# Patient Record
Sex: Female | Born: 1935 | Race: White | Hispanic: No | Smoking: Former smoker
Health system: Southern US, Community
[De-identification: ages and names within clinical notes are randomized; demographics above are authoritative.]

## PROBLEM LIST (undated history)

## (undated) DIAGNOSIS — I10 Essential (primary) hypertension: Secondary | ICD-10-CM

## (undated) DIAGNOSIS — C9110 Chronic lymphocytic leukemia of B-cell type not having achieved remission: Secondary | ICD-10-CM

## (undated) DIAGNOSIS — C569 Malignant neoplasm of unspecified ovary: Secondary | ICD-10-CM

## (undated) DIAGNOSIS — M1990 Unspecified osteoarthritis, unspecified site: Secondary | ICD-10-CM

## (undated) DIAGNOSIS — D649 Anemia, unspecified: Secondary | ICD-10-CM

## (undated) HISTORY — PX: ABDOMINAL HYSTERECTOMY: SHX81

## (undated) HISTORY — PX: APPENDECTOMY: SHX54

## (undated) HISTORY — PX: BREAST SURGERY: SHX581

## (undated) HISTORY — PX: EYE SURGERY: SHX253

---

## 1961-03-21 DIAGNOSIS — C569 Malignant neoplasm of unspecified ovary: Secondary | ICD-10-CM

## 1961-03-21 HISTORY — DX: Malignant neoplasm of unspecified ovary: C56.9

## 2004-05-10 ENCOUNTER — Ambulatory Visit: Payer: Self-pay | Admitting: Obstetrics and Gynecology

## 2005-06-06 ENCOUNTER — Ambulatory Visit: Payer: Self-pay | Admitting: Obstetrics and Gynecology

## 2005-06-09 ENCOUNTER — Ambulatory Visit: Payer: Self-pay | Admitting: Obstetrics and Gynecology

## 2005-08-26 ENCOUNTER — Ambulatory Visit: Payer: Self-pay | Admitting: Unknown Physician Specialty

## 2006-06-15 ENCOUNTER — Ambulatory Visit: Payer: Self-pay | Admitting: Obstetrics and Gynecology

## 2006-10-16 ENCOUNTER — Ambulatory Visit: Payer: Self-pay | Admitting: Internal Medicine

## 2006-10-20 ENCOUNTER — Ambulatory Visit: Payer: Self-pay | Admitting: Internal Medicine

## 2006-11-20 ENCOUNTER — Ambulatory Visit: Payer: Self-pay | Admitting: Internal Medicine

## 2006-12-20 ENCOUNTER — Ambulatory Visit: Payer: Self-pay | Admitting: Internal Medicine

## 2006-12-22 ENCOUNTER — Ambulatory Visit: Payer: Self-pay | Admitting: Internal Medicine

## 2007-01-20 ENCOUNTER — Ambulatory Visit: Payer: Self-pay | Admitting: Internal Medicine

## 2007-03-22 ENCOUNTER — Ambulatory Visit: Payer: Self-pay | Admitting: Internal Medicine

## 2007-03-23 ENCOUNTER — Ambulatory Visit: Payer: Self-pay | Admitting: Internal Medicine

## 2007-04-22 ENCOUNTER — Ambulatory Visit: Payer: Self-pay | Admitting: Internal Medicine

## 2007-05-20 ENCOUNTER — Ambulatory Visit: Payer: Self-pay | Admitting: Internal Medicine

## 2007-06-18 ENCOUNTER — Ambulatory Visit: Payer: Self-pay | Admitting: Internal Medicine

## 2007-06-20 ENCOUNTER — Ambulatory Visit: Payer: Self-pay | Admitting: Internal Medicine

## 2007-09-17 ENCOUNTER — Ambulatory Visit: Payer: Self-pay | Admitting: Obstetrics and Gynecology

## 2007-09-19 ENCOUNTER — Ambulatory Visit: Payer: Self-pay | Admitting: Internal Medicine

## 2007-10-01 ENCOUNTER — Ambulatory Visit: Payer: Self-pay | Admitting: Internal Medicine

## 2007-10-20 ENCOUNTER — Ambulatory Visit: Payer: Self-pay | Admitting: Internal Medicine

## 2008-01-20 ENCOUNTER — Ambulatory Visit: Payer: Self-pay | Admitting: Internal Medicine

## 2008-02-04 ENCOUNTER — Ambulatory Visit: Payer: Self-pay | Admitting: Internal Medicine

## 2008-02-19 ENCOUNTER — Ambulatory Visit: Payer: Self-pay | Admitting: Internal Medicine

## 2008-05-19 ENCOUNTER — Ambulatory Visit: Payer: Self-pay | Admitting: Internal Medicine

## 2008-05-26 ENCOUNTER — Ambulatory Visit: Payer: Self-pay | Admitting: Internal Medicine

## 2008-06-19 ENCOUNTER — Ambulatory Visit: Payer: Self-pay | Admitting: Internal Medicine

## 2008-08-19 ENCOUNTER — Ambulatory Visit: Payer: Self-pay | Admitting: Internal Medicine

## 2008-09-15 ENCOUNTER — Ambulatory Visit: Payer: Self-pay | Admitting: Internal Medicine

## 2008-09-18 ENCOUNTER — Ambulatory Visit: Payer: Self-pay | Admitting: Internal Medicine

## 2008-09-18 ENCOUNTER — Ambulatory Visit: Payer: Self-pay | Admitting: Obstetrics and Gynecology

## 2008-12-19 ENCOUNTER — Ambulatory Visit: Payer: Self-pay | Admitting: Internal Medicine

## 2009-01-12 ENCOUNTER — Ambulatory Visit: Payer: Self-pay | Admitting: Internal Medicine

## 2009-01-19 ENCOUNTER — Ambulatory Visit: Payer: Self-pay | Admitting: Internal Medicine

## 2009-04-06 ENCOUNTER — Ambulatory Visit: Payer: Self-pay | Admitting: Internal Medicine

## 2009-04-21 ENCOUNTER — Ambulatory Visit: Payer: Self-pay | Admitting: Internal Medicine

## 2009-06-19 ENCOUNTER — Ambulatory Visit: Payer: Self-pay | Admitting: Internal Medicine

## 2009-07-06 ENCOUNTER — Ambulatory Visit: Payer: Self-pay | Admitting: Internal Medicine

## 2009-07-06 ENCOUNTER — Emergency Department: Payer: Self-pay

## 2009-07-19 ENCOUNTER — Ambulatory Visit: Payer: Self-pay | Admitting: Internal Medicine

## 2009-08-17 ENCOUNTER — Ambulatory Visit: Payer: Self-pay | Admitting: Family Medicine

## 2009-08-19 ENCOUNTER — Ambulatory Visit: Payer: Self-pay | Admitting: Internal Medicine

## 2009-09-07 ENCOUNTER — Ambulatory Visit: Payer: Self-pay | Admitting: Internal Medicine

## 2009-09-18 ENCOUNTER — Ambulatory Visit: Payer: Self-pay | Admitting: Internal Medicine

## 2009-10-09 ENCOUNTER — Encounter: Admission: RE | Admit: 2009-10-09 | Discharge: 2009-10-09 | Payer: Self-pay | Admitting: Neurosurgery

## 2009-10-19 ENCOUNTER — Ambulatory Visit: Payer: Self-pay | Admitting: Internal Medicine

## 2009-11-19 ENCOUNTER — Ambulatory Visit: Payer: Self-pay | Admitting: Internal Medicine

## 2009-12-19 ENCOUNTER — Ambulatory Visit: Payer: Self-pay | Admitting: Internal Medicine

## 2009-12-21 ENCOUNTER — Ambulatory Visit: Payer: Self-pay | Admitting: Internal Medicine

## 2010-01-19 ENCOUNTER — Ambulatory Visit: Payer: Self-pay | Admitting: Internal Medicine

## 2010-02-22 ENCOUNTER — Ambulatory Visit: Payer: Self-pay | Admitting: Internal Medicine

## 2010-03-21 ENCOUNTER — Ambulatory Visit: Payer: Self-pay | Admitting: Internal Medicine

## 2010-05-24 ENCOUNTER — Ambulatory Visit: Payer: Self-pay | Admitting: Internal Medicine

## 2010-06-20 ENCOUNTER — Ambulatory Visit: Payer: Self-pay | Admitting: Internal Medicine

## 2010-07-20 ENCOUNTER — Ambulatory Visit: Payer: Self-pay | Admitting: Internal Medicine

## 2010-08-20 ENCOUNTER — Ambulatory Visit: Payer: Self-pay | Admitting: Internal Medicine

## 2010-09-20 ENCOUNTER — Ambulatory Visit: Payer: Self-pay | Admitting: Internal Medicine

## 2010-10-20 ENCOUNTER — Ambulatory Visit: Payer: Self-pay | Admitting: Internal Medicine

## 2010-10-27 ENCOUNTER — Ambulatory Visit: Payer: Self-pay | Admitting: Obstetrics and Gynecology

## 2010-11-29 ENCOUNTER — Ambulatory Visit: Payer: Self-pay | Admitting: Internal Medicine

## 2010-12-20 ENCOUNTER — Ambulatory Visit: Payer: Self-pay | Admitting: Internal Medicine

## 2010-12-30 ENCOUNTER — Encounter: Payer: Self-pay | Admitting: Rheumatology

## 2011-01-31 ENCOUNTER — Ambulatory Visit: Payer: Self-pay | Admitting: Internal Medicine

## 2011-02-19 ENCOUNTER — Ambulatory Visit: Payer: Self-pay | Admitting: Internal Medicine

## 2011-03-22 ENCOUNTER — Ambulatory Visit: Payer: Self-pay | Admitting: Internal Medicine

## 2011-05-18 ENCOUNTER — Ambulatory Visit: Payer: Self-pay | Admitting: Oncology

## 2011-05-18 LAB — CBC CANCER CENTER
Basophil #: 0.1 x10 3/mm (ref 0.0–0.1)
Basophil %: 1.4 %
Eosinophil #: 0.1 x10 3/mm (ref 0.0–0.7)
Eosinophil %: 1.1 %
HCT: 29.7 % — ABNORMAL LOW (ref 35.0–47.0)
HGB: 10.1 g/dL — ABNORMAL LOW (ref 12.0–16.0)
Lymphocyte #: 1.6 x10 3/mm (ref 1.0–3.6)
Lymphocyte %: 25.1 %
MCH: 35.1 pg — ABNORMAL HIGH (ref 26.0–34.0)
MCHC: 34.2 g/dL (ref 32.0–36.0)
MCV: 102.9 fL — ABNORMAL HIGH (ref 80–100)
Monocyte #: 0.6 x10 3/mm (ref 0.0–0.7)
Monocyte %: 9.1 %
Neutrophil #: 3.8 x10 3/mm (ref 1.4–6.5)
Neutrophil %: 63.3 %
Platelet: 163 x10 3/mm (ref 150–440)
RBC: 2.89 10*6/uL — ABNORMAL LOW (ref 3.80–5.20)
RDW: 13 % (ref 11.5–14.5)
WBC: 6.2 x10 3/mm (ref 3.6–11.0)

## 2011-05-20 ENCOUNTER — Ambulatory Visit: Payer: Self-pay | Admitting: Oncology

## 2011-07-20 ENCOUNTER — Ambulatory Visit: Payer: Self-pay | Admitting: Oncology

## 2011-07-20 LAB — IRON AND TIBC
Iron Bind.Cap.(Total): 382 ug/dL (ref 250–450)
Iron Saturation: 22 %
Iron: 84 ug/dL (ref 50–170)
Unbound Iron-Bind.Cap.: 298 ug/dL

## 2011-07-20 LAB — CBC CANCER CENTER
Basophil #: 0.1 x10 3/mm (ref 0.0–0.1)
Basophil %: 1.2 %
Eosinophil #: 0 x10 3/mm (ref 0.0–0.7)
Eosinophil %: 0.9 %
HCT: 29.9 % — ABNORMAL LOW (ref 35.0–47.0)
HGB: 10.3 g/dL — ABNORMAL LOW (ref 12.0–16.0)
Lymphocyte #: 1.4 x10 3/mm (ref 1.0–3.6)
Lymphocyte %: 26.3 %
MCH: 35.7 pg — ABNORMAL HIGH (ref 26.0–34.0)
MCHC: 34.5 g/dL (ref 32.0–36.0)
MCV: 103 fL — ABNORMAL HIGH (ref 80–100)
Monocyte #: 0.4 x10 3/mm (ref 0.2–0.9)
Monocyte %: 7.4 %
Neutrophil #: 3.5 x10 3/mm (ref 1.4–6.5)
Neutrophil %: 64.2 %
Platelet: 149 x10 3/mm — ABNORMAL LOW (ref 150–440)
RBC: 2.89 10*6/uL — ABNORMAL LOW (ref 3.80–5.20)
RDW: 13.2 % (ref 11.5–14.5)
WBC: 5.4 x10 3/mm (ref 3.6–11.0)

## 2011-07-20 LAB — FERRITIN: Ferritin (ARMC): 113 ng/mL (ref 8–388)

## 2011-08-20 ENCOUNTER — Ambulatory Visit: Payer: Self-pay | Admitting: Oncology

## 2011-10-26 ENCOUNTER — Ambulatory Visit: Payer: Self-pay | Admitting: Oncology

## 2011-10-26 LAB — CBC CANCER CENTER
Basophil #: 0.1 x10 3/mm (ref 0.0–0.1)
Basophil %: 1.3 %
Eosinophil #: 0.1 x10 3/mm (ref 0.0–0.7)
Eosinophil %: 1.7 %
HCT: 28.3 % — ABNORMAL LOW (ref 35.0–47.0)
HGB: 9.8 g/dL — ABNORMAL LOW (ref 12.0–16.0)
Lymphocyte #: 1.4 x10 3/mm (ref 1.0–3.6)
Lymphocyte %: 30.8 %
MCH: 35.8 pg — ABNORMAL HIGH (ref 26.0–34.0)
MCHC: 34.6 g/dL (ref 32.0–36.0)
MCV: 104 fL — ABNORMAL HIGH (ref 80–100)
Monocyte #: 0.4 x10 3/mm (ref 0.2–0.9)
Monocyte %: 8.8 %
Neutrophil #: 2.7 x10 3/mm (ref 1.4–6.5)
Neutrophil %: 57.4 %
Platelet: 152 x10 3/mm (ref 150–440)
RBC: 2.72 10*6/uL — ABNORMAL LOW (ref 3.80–5.20)
RDW: 13.7 % (ref 11.5–14.5)
WBC: 4.7 x10 3/mm (ref 3.6–11.0)

## 2011-11-20 ENCOUNTER — Ambulatory Visit: Payer: Self-pay | Admitting: Oncology

## 2011-12-08 LAB — CBC CANCER CENTER
Basophil #: 0.1 x10 3/mm (ref 0.0–0.1)
Basophil %: 1.2 %
Eosinophil #: 0.1 x10 3/mm (ref 0.0–0.7)
Eosinophil %: 1 %
HCT: 30 % — ABNORMAL LOW (ref 35.0–47.0)
HGB: 10.1 g/dL — ABNORMAL LOW (ref 12.0–16.0)
Lymphocyte #: 1.8 x10 3/mm (ref 1.0–3.6)
Lymphocyte %: 31.3 %
MCH: 35.1 pg — ABNORMAL HIGH (ref 26.0–34.0)
MCHC: 33.7 g/dL (ref 32.0–36.0)
MCV: 104 fL — ABNORMAL HIGH (ref 80–100)
Monocyte #: 0.5 x10 3/mm (ref 0.2–0.9)
Monocyte %: 8.6 %
Neutrophil #: 3.3 x10 3/mm (ref 1.4–6.5)
Neutrophil %: 57.9 %
Platelet: 157 x10 3/mm (ref 150–440)
RBC: 2.88 10*6/uL — ABNORMAL LOW (ref 3.80–5.20)
RDW: 13.2 % (ref 11.5–14.5)
WBC: 5.7 x10 3/mm (ref 3.6–11.0)

## 2011-12-08 LAB — T4, FREE: Free Thyroxine: 1.15 ng/dL (ref 0.76–1.46)

## 2011-12-08 LAB — COMPREHENSIVE METABOLIC PANEL
Albumin: 4.5 g/dL (ref 3.4–5.0)
Alkaline Phosphatase: 58 U/L (ref 50–136)
Anion Gap: 10 (ref 7–16)
BUN: 17 mg/dL (ref 7–18)
Bilirubin,Total: 0.4 mg/dL (ref 0.2–1.0)
Calcium, Total: 9.6 mg/dL (ref 8.5–10.1)
Chloride: 96 mmol/L — ABNORMAL LOW (ref 98–107)
Co2: 28 mmol/L (ref 21–32)
Creatinine: 1.25 mg/dL (ref 0.60–1.30)
EGFR (African American): 47 — ABNORMAL LOW
EGFR (Non-African Amer.): 41 — ABNORMAL LOW
Glucose: 101 mg/dL — ABNORMAL HIGH (ref 65–99)
Osmolality: 270 (ref 275–301)
Potassium: 3.5 mmol/L (ref 3.5–5.1)
SGOT(AST): 26 U/L (ref 15–37)
SGPT (ALT): 31 U/L (ref 12–78)
Sodium: 134 mmol/L — ABNORMAL LOW (ref 136–145)
Total Protein: 9.1 g/dL — ABNORMAL HIGH (ref 6.4–8.2)

## 2011-12-08 LAB — TSH: Thyroid Stimulating Horm: 2 u[IU]/mL

## 2011-12-08 LAB — LACTATE DEHYDROGENASE: LDH: 139 U/L (ref 81–234)

## 2011-12-20 ENCOUNTER — Ambulatory Visit: Payer: Self-pay | Admitting: Oncology

## 2012-01-20 ENCOUNTER — Ambulatory Visit: Payer: Self-pay | Admitting: Oncology

## 2012-01-25 LAB — CBC CANCER CENTER
Basophil #: 0.1 x10 3/mm (ref 0.0–0.1)
Basophil %: 1.3 %
Eosinophil #: 0.1 x10 3/mm (ref 0.0–0.7)
Eosinophil %: 1.4 %
HCT: 29.8 % — ABNORMAL LOW (ref 35.0–47.0)
HGB: 10.4 g/dL — ABNORMAL LOW (ref 12.0–16.0)
Lymphocyte #: 1.6 x10 3/mm (ref 1.0–3.6)
Lymphocyte %: 30.5 %
MCH: 36.2 pg — ABNORMAL HIGH (ref 26.0–34.0)
MCHC: 34.8 g/dL (ref 32.0–36.0)
MCV: 104 fL — ABNORMAL HIGH (ref 80–100)
Monocyte #: 0.5 x10 3/mm (ref 0.2–0.9)
Monocyte %: 9.5 %
Neutrophil #: 3 x10 3/mm (ref 1.4–6.5)
Neutrophil %: 57.3 %
Platelet: 138 x10 3/mm — ABNORMAL LOW (ref 150–440)
RBC: 2.86 10*6/uL — ABNORMAL LOW (ref 3.80–5.20)
RDW: 13.2 % (ref 11.5–14.5)
WBC: 5.2 x10 3/mm (ref 3.6–11.0)

## 2012-02-19 ENCOUNTER — Ambulatory Visit: Payer: Self-pay | Admitting: Oncology

## 2012-05-02 ENCOUNTER — Ambulatory Visit: Payer: Self-pay | Admitting: Oncology

## 2012-05-02 LAB — CBC CANCER CENTER
Basophil #: 0.1 x10 3/mm (ref 0.0–0.1)
Basophil %: 2.2 %
Eosinophil #: 0.1 x10 3/mm (ref 0.0–0.7)
Eosinophil %: 0.9 %
HCT: 30.5 % — ABNORMAL LOW (ref 35.0–47.0)
HGB: 10.7 g/dL — ABNORMAL LOW (ref 12.0–16.0)
Lymphocyte #: 2.3 x10 3/mm (ref 1.0–3.6)
Lymphocyte %: 39.1 %
MCH: 35.3 pg — ABNORMAL HIGH (ref 26.0–34.0)
MCHC: 35.1 g/dL (ref 32.0–36.0)
MCV: 101 fL — ABNORMAL HIGH (ref 80–100)
Monocyte #: 0.4 x10 3/mm (ref 0.2–0.9)
Monocyte %: 7.2 %
Neutrophil #: 3 x10 3/mm (ref 1.4–6.5)
Neutrophil %: 50.6 %
Platelet: 114 x10 3/mm — ABNORMAL LOW (ref 150–440)
RBC: 3.04 10*6/uL — ABNORMAL LOW (ref 3.80–5.20)
RDW: 13.1 % (ref 11.5–14.5)
WBC: 5.9 x10 3/mm (ref 3.6–11.0)

## 2012-05-19 ENCOUNTER — Ambulatory Visit: Payer: Self-pay | Admitting: Oncology

## 2012-08-22 ENCOUNTER — Ambulatory Visit: Payer: Self-pay | Admitting: Oncology

## 2012-08-22 LAB — CBC CANCER CENTER
Basophil #: 0.1 x10 3/mm (ref 0.0–0.1)
Basophil %: 1 %
Eosinophil #: 0.1 x10 3/mm (ref 0.0–0.7)
Eosinophil %: 1.1 %
HCT: 28.3 % — ABNORMAL LOW (ref 35.0–47.0)
HGB: 9.8 g/dL — ABNORMAL LOW (ref 12.0–16.0)
Lymphocyte #: 5.4 x10 3/mm — ABNORMAL HIGH (ref 1.0–3.6)
Lymphocyte %: 57.5 %
MCH: 34.7 pg — ABNORMAL HIGH (ref 26.0–34.0)
MCHC: 34.8 g/dL (ref 32.0–36.0)
MCV: 100 fL (ref 80–100)
Monocyte #: 0.6 x10 3/mm (ref 0.2–0.9)
Monocyte %: 6.1 %
Neutrophil #: 3.3 x10 3/mm (ref 1.4–6.5)
Neutrophil %: 34.3 %
Platelet: 96 x10 3/mm — ABNORMAL LOW (ref 150–440)
RBC: 2.83 10*6/uL — ABNORMAL LOW (ref 3.80–5.20)
RDW: 14.1 % (ref 11.5–14.5)
WBC: 9.5 x10 3/mm (ref 3.6–11.0)

## 2012-09-18 ENCOUNTER — Ambulatory Visit: Payer: Self-pay | Admitting: Oncology

## 2012-10-19 ENCOUNTER — Ambulatory Visit: Payer: Self-pay | Admitting: Oncology

## 2012-10-20 ENCOUNTER — Inpatient Hospital Stay: Payer: Self-pay | Admitting: Surgery

## 2012-10-20 LAB — BASIC METABOLIC PANEL
Anion Gap: 12 (ref 7–16)
BUN: 15 mg/dL (ref 7–18)
Calcium, Total: 8.5 mg/dL (ref 8.5–10.1)
Chloride: 95 mmol/L — ABNORMAL LOW (ref 98–107)
Co2: 23 mmol/L (ref 21–32)
Creatinine: 0.8 mg/dL (ref 0.60–1.30)
EGFR (African American): >60
EGFR (Non-African Amer.): >60
Glucose: 149 mg/dL — ABNORMAL HIGH (ref 65–99)
Osmolality: 264 (ref 275–301)
Potassium: 3.4 mmol/L — ABNORMAL LOW (ref 3.5–5.1)
Sodium: 130 mmol/L — ABNORMAL LOW (ref 136–145)

## 2012-10-20 LAB — CBC
HCT: 21.9 % — ABNORMAL LOW (ref 35.0–47.0)
HGB: 7.4 g/dL — ABNORMAL LOW (ref 12.0–16.0)
MCH: 33.5 pg (ref 26.0–34.0)
MCHC: 34 g/dL (ref 32.0–36.0)
MCV: 99 fL (ref 80–100)
Platelet: 56 10*3/uL — ABNORMAL LOW (ref 150–440)
RBC: 2.22 10*6/uL — ABNORMAL LOW (ref 3.80–5.20)
RDW: 14.5 % (ref 11.5–14.5)
WBC: 23 10*3/uL — ABNORMAL HIGH (ref 3.6–11.0)

## 2012-10-20 LAB — ETHANOL
Ethanol %: 0.094 % — ABNORMAL HIGH (ref 0.000–0.080)
Ethanol: 94 mg/dL

## 2012-10-21 LAB — URINALYSIS, COMPLETE
Bilirubin,UR: NEGATIVE
Glucose,UR: NEGATIVE mg/dL (ref 0–75)
Ketone: NEGATIVE
Nitrite: POSITIVE
Ph: 5 (ref 4.5–8.0)
Protein: NEGATIVE
RBC,UR: <1 /HPF (ref 0–5)
Specific Gravity: 1.01 (ref 1.003–1.030)
Squamous Epithelial: <1
WBC UR: 11 /HPF (ref 0–5)

## 2012-10-21 LAB — COMPREHENSIVE METABOLIC PANEL
Albumin: 3.3 g/dL — ABNORMAL LOW (ref 3.4–5.0)
Alkaline Phosphatase: 66 U/L (ref 50–136)
Anion Gap: 6 — ABNORMAL LOW (ref 7–16)
BUN: 15 mg/dL (ref 7–18)
Bilirubin,Total: 0.4 mg/dL (ref 0.2–1.0)
Calcium, Total: 8.2 mg/dL — ABNORMAL LOW (ref 8.5–10.1)
Chloride: 96 mmol/L — ABNORMAL LOW (ref 98–107)
Co2: 27 mmol/L (ref 21–32)
Creatinine: 0.74 mg/dL (ref 0.60–1.30)
EGFR (African American): >60
EGFR (Non-African Amer.): >60
Glucose: 202 mg/dL — ABNORMAL HIGH (ref 65–99)
Osmolality: 266 (ref 275–301)
Potassium: 3.9 mmol/L (ref 3.5–5.1)
SGOT(AST): 34 U/L (ref 15–37)
SGPT (ALT): 32 U/L (ref 12–78)
Sodium: 129 mmol/L — ABNORMAL LOW (ref 136–145)
Total Protein: 6.6 g/dL (ref 6.4–8.2)

## 2012-10-21 LAB — CBC WITH DIFFERENTIAL/PLATELET
Basophil #: 0.2 10*3/uL — ABNORMAL HIGH (ref 0.0–0.1)
Basophil %: 1.3 %
Eosinophil #: 0.1 10*3/uL (ref 0.0–0.7)
Eosinophil %: 0.6 %
HCT: 19.1 % — ABNORMAL LOW (ref 35.0–47.0)
HGB: 6.7 g/dL — ABNORMAL LOW (ref 12.0–16.0)
Lymphocyte #: 11.5 10*3/uL — ABNORMAL HIGH (ref 1.0–3.6)
Lymphocyte %: 63.6 %
MCH: 34.1 pg — ABNORMAL HIGH (ref 26.0–34.0)
MCHC: 35.2 g/dL (ref 32.0–36.0)
MCV: 97 fL (ref 80–100)
Monocyte #: 0.9 x10 3/mm (ref 0.2–0.9)
Monocyte %: 5.1 %
Neutrophil #: 5.3 10*3/uL (ref 1.4–6.5)
Neutrophil %: 29.4 %
Platelet: 50 10*3/uL — ABNORMAL LOW (ref 150–440)
RBC: 1.97 10*6/uL — ABNORMAL LOW (ref 3.80–5.20)
RDW: 14.3 % (ref 11.5–14.5)
WBC: 18.1 10*3/uL — ABNORMAL HIGH (ref 3.6–11.0)

## 2012-10-21 LAB — MAGNESIUM: Magnesium: 1.4 mg/dL — ABNORMAL LOW

## 2012-10-21 LAB — PROTIME-INR
INR: 1.1
Prothrombin Time: 14.1 secs (ref 11.5–14.7)

## 2012-10-21 LAB — AMYLASE: Amylase: 58 U/L (ref 25–115)

## 2012-10-21 LAB — LIPASE, BLOOD: Lipase: 269 U/L (ref 73–393)

## 2012-10-21 LAB — APTT: Activated PTT: 25.8 secs (ref 23.6–35.9)

## 2012-10-22 LAB — HEMOGLOBIN: HGB: 6.5 g/dL — ABNORMAL LOW (ref 12.0–16.0)

## 2012-10-23 LAB — BASIC METABOLIC PANEL
Anion Gap: 2 — ABNORMAL LOW (ref 7–16)
BUN: 9 mg/dL (ref 7–18)
Calcium, Total: 7.8 mg/dL — ABNORMAL LOW (ref 8.5–10.1)
Chloride: 108 mmol/L — ABNORMAL HIGH (ref 98–107)
Co2: 27 mmol/L (ref 21–32)
Creatinine: 0.66 mg/dL (ref 0.60–1.30)
EGFR (African American): >60
EGFR (Non-African Amer.): >60
Glucose: 136 mg/dL — ABNORMAL HIGH (ref 65–99)
Osmolality: 275 (ref 275–301)
Potassium: 4.5 mmol/L (ref 3.5–5.1)
Sodium: 137 mmol/L (ref 136–145)

## 2012-10-23 LAB — CBC WITH DIFFERENTIAL/PLATELET
Basophil #: 0.3 10*3/uL — ABNORMAL HIGH (ref 0.0–0.1)
Basophil %: 1.3 %
Eosinophil #: 0.2 10*3/uL (ref 0.0–0.7)
Eosinophil %: 0.8 %
HCT: 22.7 % — ABNORMAL LOW (ref 35.0–47.0)
HGB: 7.8 g/dL — ABNORMAL LOW (ref 12.0–16.0)
Lymphocyte #: 20.8 10*3/uL — ABNORMAL HIGH (ref 1.0–3.6)
Lymphocyte %: 80.6 %
MCH: 32.8 pg (ref 26.0–34.0)
MCHC: 34.5 g/dL (ref 32.0–36.0)
MCV: 95 fL (ref 80–100)
Monocyte #: 1 x10 3/mm — ABNORMAL HIGH (ref 0.2–0.9)
Monocyte %: 3.8 %
Neutrophil #: 3.5 10*3/uL (ref 1.4–6.5)
Neutrophil %: 13.5 %
Platelet: 48 10*3/uL — ABNORMAL LOW (ref 150–440)
RBC: 2.39 10*6/uL — ABNORMAL LOW (ref 3.80–5.20)
RDW: 17.1 % — ABNORMAL HIGH (ref 11.5–14.5)
WBC: 25.8 10*3/uL — ABNORMAL HIGH (ref 3.6–11.0)

## 2012-10-23 LAB — MAGNESIUM: Magnesium: 2.9 mg/dL — ABNORMAL HIGH

## 2012-10-24 LAB — URINE CULTURE

## 2012-10-25 LAB — BASIC METABOLIC PANEL
Anion Gap: 5 — ABNORMAL LOW (ref 7–16)
BUN: 12 mg/dL (ref 7–18)
Calcium, Total: 8 mg/dL — ABNORMAL LOW (ref 8.5–10.1)
Chloride: 103 mmol/L (ref 98–107)
Co2: 26 mmol/L (ref 21–32)
Creatinine: 0.69 mg/dL (ref 0.60–1.30)
EGFR (African American): >60
EGFR (Non-African Amer.): >60
Glucose: 93 mg/dL (ref 65–99)
Osmolality: 268 (ref 275–301)
Potassium: 4.9 mmol/L (ref 3.5–5.1)
Sodium: 134 mmol/L — ABNORMAL LOW (ref 136–145)

## 2012-10-27 LAB — CBC WITH DIFFERENTIAL/PLATELET
Basophil #: 0.4 10*3/uL — ABNORMAL HIGH (ref 0.0–0.1)
Basophil %: 1.3 %
Eosinophil #: 0.2 10*3/uL (ref 0.0–0.7)
Eosinophil %: 0.7 %
HCT: 22.8 % — ABNORMAL LOW (ref 35.0–47.0)
HGB: 7.9 g/dL — ABNORMAL LOW (ref 12.0–16.0)
Lymphocyte #: 24.6 10*3/uL — ABNORMAL HIGH (ref 1.0–3.6)
Lymphocyte %: 83 %
MCH: 33.1 pg (ref 26.0–34.0)
MCHC: 34.8 g/dL (ref 32.0–36.0)
MCV: 95 fL (ref 80–100)
Monocyte #: 1.4 x10 3/mm — ABNORMAL HIGH (ref 0.2–0.9)
Monocyte %: 4.6 %
Neutrophil #: 3.1 10*3/uL (ref 1.4–6.5)
Neutrophil %: 10.4 %
Platelet: 52 10*3/uL — ABNORMAL LOW (ref 150–440)
RBC: 2.39 10*6/uL — ABNORMAL LOW (ref 3.80–5.20)
RDW: 16.7 % — ABNORMAL HIGH (ref 11.5–14.5)
WBC: 29.7 10*3/uL — ABNORMAL HIGH (ref 3.6–11.0)

## 2012-10-31 ENCOUNTER — Encounter: Payer: Self-pay | Admitting: Internal Medicine

## 2012-11-19 ENCOUNTER — Encounter: Payer: Self-pay | Admitting: Internal Medicine

## 2012-11-19 ENCOUNTER — Ambulatory Visit: Payer: Self-pay | Admitting: Oncology

## 2012-11-28 ENCOUNTER — Ambulatory Visit: Payer: Self-pay | Admitting: Oncology

## 2012-11-28 LAB — CBC CANCER CENTER
Basophil #: 0.2 x10 3/mm — ABNORMAL HIGH (ref 0.0–0.1)
Basophil %: 0.5 %
Eosinophil #: 0.7 x10 3/mm (ref 0.0–0.7)
Eosinophil %: 1.4 %
HCT: 19.7 % — ABNORMAL LOW (ref 35.0–47.0)
HGB: 6.2 g/dL — ABNORMAL LOW (ref 12.0–16.0)
Lymphocyte #: 40.7 x10 3/mm — ABNORMAL HIGH (ref 1.0–3.6)
Lymphocyte %: 87.6 %
MCH: 30.7 pg (ref 26.0–34.0)
MCHC: 31.3 g/dL — ABNORMAL LOW (ref 32.0–36.0)
MCV: 98 fL (ref 80–100)
Monocyte #: 0.2 x10 3/mm (ref 0.2–0.9)
Monocyte %: 0.3 %
Neutrophil #: 4.8 x10 3/mm (ref 1.4–6.5)
Neutrophil %: 10.2 %
Platelet: 56 x10 3/mm — ABNORMAL LOW (ref 150–440)
RBC: 2.01 10*6/uL — ABNORMAL LOW (ref 3.80–5.20)
RDW: 18.3 % — ABNORMAL HIGH (ref 11.5–14.5)
WBC: 46.5 x10 3/mm — ABNORMAL HIGH (ref 3.6–11.0)

## 2012-11-28 LAB — LACTATE DEHYDROGENASE: LDH: 386 U/L — ABNORMAL HIGH (ref 81–246)

## 2012-12-05 LAB — CBC CANCER CENTER
Bands: 2 %
Eosinophil: 1 %
HCT: 22.8 % — ABNORMAL LOW (ref 35.0–47.0)
HGB: 7.3 g/dL — ABNORMAL LOW (ref 12.0–16.0)
Lymphocytes: 86 %
MCH: 29.4 pg (ref 26.0–34.0)
MCHC: 32.1 g/dL (ref 32.0–36.0)
MCV: 92 fL (ref 80–100)
Monocytes: 1 %
Other Cells Blood: 1 %
Platelet: 39 x10 3/mm — ABNORMAL LOW (ref 150–440)
RBC: 2.49 10*6/uL — ABNORMAL LOW (ref 3.80–5.20)
RDW: 20 % — ABNORMAL HIGH (ref 11.5–14.5)
Segmented Neutrophils: 9 %
WBC: 42.2 x10 3/mm — ABNORMAL HIGH (ref 3.6–11.0)

## 2012-12-12 LAB — CBC CANCER CENTER
Basophil #: 0.1 x10 3/mm (ref 0.0–0.1)
Basophil %: 0.3 %
Eosinophil #: 0.2 x10 3/mm (ref 0.0–0.7)
Eosinophil %: 0.5 %
HCT: 23 % — ABNORMAL LOW (ref 35.0–47.0)
HGB: 7.5 g/dL — ABNORMAL LOW (ref 12.0–16.0)
Lymphocyte #: 40.8 x10 3/mm — ABNORMAL HIGH (ref 1.0–3.6)
Lymphocyte %: 90.4 %
MCH: 29.6 pg (ref 26.0–34.0)
MCHC: 32.5 g/dL (ref 32.0–36.0)
MCV: 91 fL (ref 80–100)
Monocyte #: 1 x10 3/mm — ABNORMAL HIGH (ref 0.2–0.9)
Monocyte %: 2.2 %
Neutrophil #: 3 x10 3/mm (ref 1.4–6.5)
Neutrophil %: 6.6 %
Platelet: 30 x10 3/mm — CL (ref 150–440)
RBC: 2.52 10*6/uL — ABNORMAL LOW (ref 3.80–5.20)
RDW: 19 % — ABNORMAL HIGH (ref 11.5–14.5)
WBC: 45.1 x10 3/mm — ABNORMAL HIGH (ref 3.6–11.0)

## 2012-12-14 LAB — CREATININE, SERUM
Creatinine: 0.86 mg/dL (ref 0.60–1.30)
EGFR (African American): >60
EGFR (Non-African Amer.): >60

## 2012-12-19 ENCOUNTER — Ambulatory Visit: Payer: Self-pay | Admitting: Oncology

## 2012-12-19 LAB — CBC CANCER CENTER
Basophil #: 0.3 x10 3/mm — ABNORMAL HIGH (ref 0.0–0.1)
Basophil %: 0.7 %
Eosinophil #: 0.2 x10 3/mm (ref 0.0–0.7)
Eosinophil %: 0.5 %
HCT: 20.3 % — ABNORMAL LOW (ref 35.0–47.0)
HGB: 6.3 g/dL — ABNORMAL LOW (ref 12.0–16.0)
Lymphocyte #: 35.5 x10 3/mm — ABNORMAL HIGH (ref 1.0–3.6)
Lymphocyte %: 88.4 %
MCH: 28.5 pg (ref 26.0–34.0)
MCHC: 31 g/dL — ABNORMAL LOW (ref 32.0–36.0)
MCV: 92 fL (ref 80–100)
Monocyte #: 0.6 x10 3/mm (ref 0.2–0.9)
Monocyte %: 1.6 %
Neutrophil #: 3.5 x10 3/mm (ref 1.4–6.5)
Neutrophil %: 8.8 %
Platelet: 28 x10 3/mm — CL (ref 150–440)
RBC: 2.2 10*6/uL — ABNORMAL LOW (ref 3.80–5.20)
RDW: 20.1 % — ABNORMAL HIGH (ref 11.5–14.5)
WBC: 40.1 x10 3/mm — ABNORMAL HIGH (ref 3.6–11.0)

## 2012-12-19 LAB — BASIC METABOLIC PANEL
Anion Gap: 11 (ref 7–16)
BUN: 15 mg/dL (ref 7–18)
Calcium, Total: 8.3 mg/dL — ABNORMAL LOW (ref 8.5–10.1)
Chloride: 99 mmol/L (ref 98–107)
Co2: 26 mmol/L (ref 21–32)
Creatinine: 0.9 mg/dL (ref 0.60–1.30)
EGFR (African American): >60
EGFR (Non-African Amer.): >60
Glucose: 125 mg/dL — ABNORMAL HIGH (ref 65–99)
Osmolality: 274 (ref 275–301)
Potassium: 3.5 mmol/L (ref 3.5–5.1)
Sodium: 136 mmol/L (ref 136–145)

## 2012-12-26 LAB — CBC CANCER CENTER
Basophil #: 0.3 x10 3/mm — ABNORMAL HIGH (ref 0.0–0.1)
Basophil %: 1.3 %
Eosinophil #: 0.1 x10 3/mm (ref 0.0–0.7)
Eosinophil %: 0.6 %
HCT: 24 % — ABNORMAL LOW (ref 35.0–47.0)
HGB: 7.8 g/dL — ABNORMAL LOW (ref 12.0–16.0)
Lymphocyte #: 16.4 x10 3/mm — ABNORMAL HIGH (ref 1.0–3.6)
Lymphocyte %: 79.7 %
MCH: 28.9 pg (ref 26.0–34.0)
MCHC: 32.6 g/dL (ref 32.0–36.0)
MCV: 89 fL (ref 80–100)
Monocyte #: 0.7 x10 3/mm (ref 0.2–0.9)
Monocyte %: 3.2 %
Neutrophil #: 3.1 x10 3/mm (ref 1.4–6.5)
Neutrophil %: 15.2 %
Platelet: 48 x10 3/mm — ABNORMAL LOW (ref 150–440)
RBC: 2.71 10*6/uL — ABNORMAL LOW (ref 3.80–5.20)
RDW: 19.8 % — ABNORMAL HIGH (ref 11.5–14.5)
WBC: 20.6 x10 3/mm — ABNORMAL HIGH (ref 3.6–11.0)

## 2012-12-26 LAB — COMPREHENSIVE METABOLIC PANEL
Albumin: 3.7 g/dL (ref 3.4–5.0)
Alkaline Phosphatase: 146 U/L — ABNORMAL HIGH (ref 50–136)
Anion Gap: 11 (ref 7–16)
BUN: 15 mg/dL (ref 7–18)
Bilirubin,Total: 0.8 mg/dL (ref 0.2–1.0)
Calcium, Total: 8.9 mg/dL (ref 8.5–10.1)
Chloride: 98 mmol/L (ref 98–107)
Co2: 24 mmol/L (ref 21–32)
Creatinine: 0.79 mg/dL (ref 0.60–1.30)
EGFR (African American): >60
EGFR (Non-African Amer.): >60
Glucose: 125 mg/dL — ABNORMAL HIGH (ref 65–99)
Osmolality: 269 (ref 275–301)
Potassium: 3.9 mmol/L (ref 3.5–5.1)
SGOT(AST): 15 U/L (ref 15–37)
SGPT (ALT): 23 U/L (ref 12–78)
Sodium: 133 mmol/L — ABNORMAL LOW (ref 136–145)
Total Protein: 7.1 g/dL (ref 6.4–8.2)

## 2013-01-02 LAB — CBC CANCER CENTER
Basophil #: 0.3 x10 3/mm — ABNORMAL HIGH (ref 0.0–0.1)
Basophil %: 3.2 %
Eosinophil #: 0.3 x10 3/mm (ref 0.0–0.7)
Eosinophil %: 3.9 %
HCT: 21.2 % — ABNORMAL LOW (ref 35.0–47.0)
HGB: 6.9 g/dL — ABNORMAL LOW (ref 12.0–16.0)
Lymphocyte #: 4.5 x10 3/mm — ABNORMAL HIGH (ref 1.0–3.6)
Lymphocyte %: 51.8 %
MCH: 29.4 pg (ref 26.0–34.0)
MCHC: 32.7 g/dL (ref 32.0–36.0)
MCV: 90 fL (ref 80–100)
Monocyte #: 0.5 x10 3/mm (ref 0.2–0.9)
Monocyte %: 5.8 %
Neutrophil #: 3.1 x10 3/mm (ref 1.4–6.5)
Neutrophil %: 35.3 %
Platelet: 95 x10 3/mm — ABNORMAL LOW (ref 150–440)
RBC: 2.35 10*6/uL — ABNORMAL LOW (ref 3.80–5.20)
RDW: 21 % — ABNORMAL HIGH (ref 11.5–14.5)
WBC: 8.8 x10 3/mm (ref 3.6–11.0)

## 2013-01-09 LAB — CBC CANCER CENTER
Basophil #: 0.3 x10 3/mm — ABNORMAL HIGH (ref 0.0–0.1)
Basophil %: 5.6 %
Eosinophil #: 0.2 x10 3/mm (ref 0.0–0.7)
Eosinophil %: 3.7 %
HCT: 27.2 % — ABNORMAL LOW (ref 35.0–47.0)
HGB: 9.2 g/dL — ABNORMAL LOW (ref 12.0–16.0)
Lymphocyte #: 1.8 x10 3/mm (ref 1.0–3.6)
Lymphocyte %: 35.4 %
MCH: 30.7 pg (ref 26.0–34.0)
MCHC: 33.7 g/dL (ref 32.0–36.0)
MCV: 91 fL (ref 80–100)
Monocyte #: 0.4 x10 3/mm (ref 0.2–0.9)
Monocyte %: 7.1 %
Neutrophil #: 2.4 x10 3/mm (ref 1.4–6.5)
Neutrophil %: 48.2 %
Platelet: 128 x10 3/mm — ABNORMAL LOW (ref 150–440)
RBC: 2.99 10*6/uL — ABNORMAL LOW (ref 3.80–5.20)
RDW: 22 % — ABNORMAL HIGH (ref 11.5–14.5)
WBC: 4.9 x10 3/mm (ref 3.6–11.0)

## 2013-01-19 ENCOUNTER — Ambulatory Visit: Payer: Self-pay | Admitting: Oncology

## 2013-02-06 LAB — CBC CANCER CENTER
Basophil #: 0.1 x10 3/mm (ref 0.0–0.1)
Basophil %: 1.7 %
Eosinophil #: 0.1 x10 3/mm (ref 0.0–0.7)
Eosinophil %: 1.6 %
HCT: 27.8 % — ABNORMAL LOW (ref 35.0–47.0)
HGB: 9.1 g/dL — ABNORMAL LOW (ref 12.0–16.0)
Lymphocyte #: 1.5 x10 3/mm (ref 1.0–3.6)
Lymphocyte %: 32.9 %
MCH: 32.3 pg (ref 26.0–34.0)
MCHC: 32.8 g/dL (ref 32.0–36.0)
MCV: 99 fL (ref 80–100)
Monocyte #: 0.4 x10 3/mm (ref 0.2–0.9)
Monocyte %: 8.6 %
Neutrophil #: 2.5 x10 3/mm (ref 1.4–6.5)
Neutrophil %: 55.2 %
Platelet: 114 x10 3/mm — ABNORMAL LOW (ref 150–440)
RBC: 2.82 10*6/uL — ABNORMAL LOW (ref 3.80–5.20)
RDW: 19.9 % — ABNORMAL HIGH (ref 11.5–14.5)
WBC: 4.4 x10 3/mm (ref 3.6–11.0)

## 2013-02-18 ENCOUNTER — Ambulatory Visit: Payer: Self-pay | Admitting: Oncology

## 2013-02-27 LAB — CBC CANCER CENTER
Basophil #: 0.1 x10 3/mm (ref 0.0–0.1)
Basophil %: 3.5 %
Eosinophil #: 0.1 x10 3/mm (ref 0.0–0.7)
Eosinophil %: 3.4 %
HCT: 31.2 % — ABNORMAL LOW (ref 35.0–47.0)
HGB: 10.6 g/dL — ABNORMAL LOW (ref 12.0–16.0)
Lymphocyte #: 1.3 x10 3/mm (ref 1.0–3.6)
Lymphocyte %: 33.5 %
MCH: 34 pg (ref 26.0–34.0)
MCHC: 33.9 g/dL (ref 32.0–36.0)
MCV: 100 fL (ref 80–100)
Monocyte #: 0.4 x10 3/mm (ref 0.2–0.9)
Monocyte %: 10.1 %
Neutrophil #: 1.9 x10 3/mm (ref 1.4–6.5)
Neutrophil %: 49.5 %
Platelet: 86 x10 3/mm — ABNORMAL LOW (ref 150–440)
RBC: 3.11 10*6/uL — ABNORMAL LOW (ref 3.80–5.20)
RDW: 15 % — ABNORMAL HIGH (ref 11.5–14.5)
WBC: 3.8 x10 3/mm (ref 3.6–11.0)

## 2013-02-27 LAB — LACTATE DEHYDROGENASE: LDH: 175 U/L (ref 81–246)

## 2013-03-21 ENCOUNTER — Ambulatory Visit: Payer: Self-pay | Admitting: Oncology

## 2013-03-27 LAB — COMPREHENSIVE METABOLIC PANEL
Albumin: 4.1 g/dL (ref 3.4–5.0)
Alkaline Phosphatase: 87 U/L
Anion Gap: 9 (ref 7–16)
BUN: 16 mg/dL (ref 7–18)
Bilirubin,Total: 0.5 mg/dL (ref 0.2–1.0)
Calcium, Total: 9.6 mg/dL (ref 8.5–10.1)
Chloride: 98 mmol/L (ref 98–107)
Co2: 28 mmol/L (ref 21–32)
Creatinine: 0.78 mg/dL (ref 0.60–1.30)
EGFR (African American): >60
EGFR (Non-African Amer.): >60
Glucose: 102 mg/dL — ABNORMAL HIGH (ref 65–99)
Osmolality: 271 (ref 275–301)
Potassium: 4.1 mmol/L (ref 3.5–5.1)
SGOT(AST): 12 U/L — ABNORMAL LOW (ref 15–37)
SGPT (ALT): 20 U/L (ref 12–78)
Sodium: 135 mmol/L — ABNORMAL LOW (ref 136–145)
Total Protein: 7.3 g/dL (ref 6.4–8.2)

## 2013-03-27 LAB — CBC CANCER CENTER
Basophil #: 0.1 x10 3/mm (ref 0.0–0.1)
Basophil %: 3.2 %
Eosinophil #: 0.2 x10 3/mm (ref 0.0–0.7)
Eosinophil %: 3.5 %
HCT: 29.9 % — ABNORMAL LOW (ref 35.0–47.0)
HGB: 10.2 g/dL — ABNORMAL LOW (ref 12.0–16.0)
Lymphocyte #: 2.1 x10 3/mm (ref 1.0–3.6)
Lymphocyte %: 48.2 %
MCH: 33.3 pg (ref 26.0–34.0)
MCHC: 34.1 g/dL (ref 32.0–36.0)
MCV: 97 fL (ref 80–100)
Monocyte #: 0.4 x10 3/mm (ref 0.2–0.9)
Monocyte %: 9.4 %
Neutrophil #: 1.6 x10 3/mm (ref 1.4–6.5)
Neutrophil %: 35.7 %
Platelet: 61 x10 3/mm — ABNORMAL LOW (ref 150–440)
RBC: 3.07 10*6/uL — ABNORMAL LOW (ref 3.80–5.20)
RDW: 13.2 % (ref 11.5–14.5)
WBC: 4.4 x10 3/mm (ref 3.6–11.0)

## 2013-03-27 LAB — LACTATE DEHYDROGENASE: LDH: 203 U/L (ref 81–246)

## 2013-04-03 LAB — CBC CANCER CENTER
Basophil #: 0.1 x10 3/mm (ref 0.0–0.1)
Basophil %: 2.7 %
Eosinophil #: 0.2 x10 3/mm (ref 0.0–0.7)
Eosinophil %: 3.6 %
HCT: 28.5 % — ABNORMAL LOW (ref 35.0–47.0)
HGB: 9.8 g/dL — ABNORMAL LOW (ref 12.0–16.0)
Lymphocyte #: 2.3 x10 3/mm (ref 1.0–3.6)
Lymphocyte %: 44.3 %
MCH: 33.4 pg (ref 26.0–34.0)
MCHC: 34.3 g/dL (ref 32.0–36.0)
MCV: 98 fL (ref 80–100)
Monocyte #: 0.5 x10 3/mm (ref 0.2–0.9)
Monocyte %: 10.1 %
Neutrophil #: 2 x10 3/mm (ref 1.4–6.5)
Neutrophil %: 39.3 %
Platelet: 63 x10 3/mm — ABNORMAL LOW (ref 150–440)
RBC: 2.92 10*6/uL — ABNORMAL LOW (ref 3.80–5.20)
RDW: 13.5 % (ref 11.5–14.5)
WBC: 5.2 x10 3/mm (ref 3.6–11.0)

## 2013-04-10 LAB — CBC CANCER CENTER
Basophil #: 0.1 x10 3/mm (ref 0.0–0.1)
Basophil %: 3 %
Eosinophil #: 0.1 x10 3/mm (ref 0.0–0.7)
Eosinophil %: 1.7 %
HCT: 31.4 % — ABNORMAL LOW (ref 35.0–47.0)
HGB: 10.7 g/dL — ABNORMAL LOW (ref 12.0–16.0)
Lymphocyte #: 1.6 x10 3/mm (ref 1.0–3.6)
Lymphocyte %: 37.2 %
MCH: 33.6 pg (ref 26.0–34.0)
MCHC: 34.3 g/dL (ref 32.0–36.0)
MCV: 98 fL (ref 80–100)
Monocyte #: 0.3 x10 3/mm (ref 0.2–0.9)
Monocyte %: 7.7 %
Neutrophil #: 2.2 x10 3/mm (ref 1.4–6.5)
Neutrophil %: 50.4 %
Platelet: 80 x10 3/mm — ABNORMAL LOW (ref 150–440)
RBC: 3.2 10*6/uL — ABNORMAL LOW (ref 3.80–5.20)
RDW: 13.6 % (ref 11.5–14.5)
WBC: 4.4 x10 3/mm (ref 3.6–11.0)

## 2013-04-10 LAB — BASIC METABOLIC PANEL
Anion Gap: 10 (ref 7–16)
BUN: 14 mg/dL (ref 7–18)
Calcium, Total: 9.3 mg/dL (ref 8.5–10.1)
Chloride: 102 mmol/L (ref 98–107)
Co2: 29 mmol/L (ref 21–32)
Creatinine: 0.99 mg/dL (ref 0.60–1.30)
EGFR (African American): >60
EGFR (Non-African Amer.): 53 — ABNORMAL LOW
Glucose: 123 mg/dL — ABNORMAL HIGH (ref 65–99)
Osmolality: 283 (ref 275–301)
Potassium: 4.1 mmol/L (ref 3.5–5.1)
Sodium: 141 mmol/L (ref 136–145)

## 2013-04-10 LAB — LACTATE DEHYDROGENASE: LDH: 164 U/L (ref 81–246)

## 2013-04-17 ENCOUNTER — Ambulatory Visit: Payer: Self-pay | Admitting: Family Medicine

## 2013-04-17 LAB — CBC CANCER CENTER
Basophil #: 0.1 x10 3/mm (ref 0.0–0.1)
Basophil %: 2.5 %
Eosinophil #: 0.1 x10 3/mm (ref 0.0–0.7)
Eosinophil %: 2.5 %
HCT: 31.9 % — ABNORMAL LOW (ref 35.0–47.0)
HGB: 11 g/dL — ABNORMAL LOW (ref 12.0–16.0)
Lymphocyte #: 1.1 x10 3/mm (ref 1.0–3.6)
Lymphocyte %: 26.4 %
MCH: 33.3 pg (ref 26.0–34.0)
MCHC: 34.5 g/dL (ref 32.0–36.0)
MCV: 97 fL (ref 80–100)
Monocyte #: 0.3 x10 3/mm (ref 0.2–0.9)
Monocyte %: 7.5 %
Neutrophil #: 2.6 x10 3/mm (ref 1.4–6.5)
Neutrophil %: 61.1 %
Platelet: 109 x10 3/mm — ABNORMAL LOW (ref 150–440)
RBC: 3.31 10*6/uL — ABNORMAL LOW (ref 3.80–5.20)
RDW: 13.7 % (ref 11.5–14.5)
WBC: 4.3 x10 3/mm (ref 3.6–11.0)

## 2013-04-17 LAB — TSH: Thyroid Stimulating Horm: 1.7 u[IU]/mL

## 2013-04-21 ENCOUNTER — Ambulatory Visit: Payer: Self-pay | Admitting: Oncology

## 2013-04-24 LAB — CBC CANCER CENTER
Basophil #: 0.1 x10 3/mm (ref 0.0–0.1)
Basophil %: 2 %
Eosinophil #: 0.1 x10 3/mm (ref 0.0–0.7)
Eosinophil %: 1.3 %
HCT: 31.3 % — ABNORMAL LOW (ref 35.0–47.0)
HGB: 10.9 g/dL — ABNORMAL LOW (ref 12.0–16.0)
Lymphocyte #: 1.1 x10 3/mm (ref 1.0–3.6)
Lymphocyte %: 19.7 %
MCH: 33.6 pg (ref 26.0–34.0)
MCHC: 34.8 g/dL (ref 32.0–36.0)
MCV: 97 fL (ref 80–100)
Monocyte #: 0.3 x10 3/mm (ref 0.2–0.9)
Monocyte %: 5.9 %
Neutrophil #: 3.8 x10 3/mm (ref 1.4–6.5)
Neutrophil %: 71.1 %
Platelet: 133 x10 3/mm — ABNORMAL LOW (ref 150–440)
RBC: 3.24 10*6/uL — ABNORMAL LOW (ref 3.80–5.20)
RDW: 13.8 % (ref 11.5–14.5)
WBC: 5.3 x10 3/mm (ref 3.6–11.0)

## 2013-04-24 LAB — LACTATE DEHYDROGENASE: LDH: 145 U/L (ref 81–246)

## 2013-04-24 LAB — BASIC METABOLIC PANEL
Anion Gap: 9 (ref 7–16)
BUN: 14 mg/dL (ref 7–18)
Calcium, Total: 9 mg/dL (ref 8.5–10.1)
Chloride: 104 mmol/L (ref 98–107)
Co2: 29 mmol/L (ref 21–32)
Creatinine: 0.71 mg/dL (ref 0.60–1.30)
EGFR (African American): >60
EGFR (Non-African Amer.): >60
Glucose: 104 mg/dL — ABNORMAL HIGH (ref 65–99)
Osmolality: 284 (ref 275–301)
Potassium: 3.8 mmol/L (ref 3.5–5.1)
Sodium: 142 mmol/L (ref 136–145)

## 2013-05-01 LAB — CBC CANCER CENTER
Basophil #: 0.1 x10 3/mm (ref 0.0–0.1)
Basophil %: 1.1 %
Eosinophil #: 0.1 x10 3/mm (ref 0.0–0.7)
Eosinophil %: 1.6 %
HCT: 31.9 % — ABNORMAL LOW (ref 35.0–47.0)
HGB: 10.9 g/dL — ABNORMAL LOW (ref 12.0–16.0)
Lymphocyte #: 1.1 x10 3/mm (ref 1.0–3.6)
Lymphocyte %: 23 %
MCH: 33.3 pg (ref 26.0–34.0)
MCHC: 34.2 g/dL (ref 32.0–36.0)
MCV: 98 fL (ref 80–100)
Monocyte #: 0.3 x10 3/mm (ref 0.2–0.9)
Monocyte %: 6.9 %
Neutrophil #: 3.1 x10 3/mm (ref 1.4–6.5)
Neutrophil %: 67.4 %
Platelet: 121 x10 3/mm — ABNORMAL LOW (ref 150–440)
RBC: 3.27 10*6/uL — ABNORMAL LOW (ref 3.80–5.20)
RDW: 14.1 % (ref 11.5–14.5)
WBC: 4.6 x10 3/mm (ref 3.6–11.0)

## 2013-05-01 LAB — BASIC METABOLIC PANEL
Anion Gap: 9 (ref 7–16)
BUN: 11 mg/dL (ref 7–18)
Calcium, Total: 8.9 mg/dL (ref 8.5–10.1)
Chloride: 102 mmol/L (ref 98–107)
Co2: 29 mmol/L (ref 21–32)
Creatinine: 0.77 mg/dL (ref 0.60–1.30)
EGFR (African American): >60
EGFR (Non-African Amer.): >60
Glucose: 109 mg/dL — ABNORMAL HIGH (ref 65–99)
Osmolality: 279 (ref 275–301)
Potassium: 3.8 mmol/L (ref 3.5–5.1)
Sodium: 140 mmol/L (ref 136–145)

## 2013-05-01 LAB — LACTATE DEHYDROGENASE: LDH: 138 U/L (ref 81–246)

## 2013-05-15 LAB — CBC CANCER CENTER
Basophil #: 0.1 x10 3/mm (ref 0.0–0.1)
Basophil %: 2.1 %
Eosinophil #: 0.1 x10 3/mm (ref 0.0–0.7)
Eosinophil %: 1.1 %
HCT: 34 % — ABNORMAL LOW (ref 35.0–47.0)
HGB: 11.5 g/dL — ABNORMAL LOW (ref 12.0–16.0)
Lymphocyte #: 0.9 x10 3/mm — ABNORMAL LOW (ref 1.0–3.6)
Lymphocyte %: 20.6 %
MCH: 33.1 pg (ref 26.0–34.0)
MCHC: 33.9 g/dL (ref 32.0–36.0)
MCV: 98 fL (ref 80–100)
Monocyte #: 0.3 x10 3/mm (ref 0.2–0.9)
Monocyte %: 5.8 %
Neutrophil #: 3.2 x10 3/mm (ref 1.4–6.5)
Neutrophil %: 70.4 %
Platelet: 133 x10 3/mm — ABNORMAL LOW (ref 150–440)
RBC: 3.48 10*6/uL — ABNORMAL LOW (ref 3.80–5.20)
RDW: 14.4 % (ref 11.5–14.5)
WBC: 4.5 x10 3/mm (ref 3.6–11.0)

## 2013-05-15 LAB — BASIC METABOLIC PANEL
Anion Gap: 9 (ref 7–16)
BUN: 12 mg/dL (ref 7–18)
Calcium, Total: 9.3 mg/dL (ref 8.5–10.1)
Chloride: 103 mmol/L (ref 98–107)
Co2: 31 mmol/L (ref 21–32)
Creatinine: 0.76 mg/dL (ref 0.60–1.30)
EGFR (African American): >60
EGFR (Non-African Amer.): >60
Glucose: 113 mg/dL — ABNORMAL HIGH (ref 65–99)
Osmolality: 286 (ref 275–301)
Potassium: 3.7 mmol/L (ref 3.5–5.1)
Sodium: 143 mmol/L (ref 136–145)

## 2013-05-15 LAB — LACTATE DEHYDROGENASE: LDH: 140 U/L (ref 81–246)

## 2013-05-19 ENCOUNTER — Ambulatory Visit: Payer: Self-pay | Admitting: Oncology

## 2013-06-25 ENCOUNTER — Ambulatory Visit: Payer: Self-pay | Admitting: Oncology

## 2013-06-26 ENCOUNTER — Ambulatory Visit: Payer: Self-pay | Admitting: Oncology

## 2013-06-26 LAB — CBC CANCER CENTER
Basophil #: 0 x10 3/mm (ref 0.0–0.1)
Basophil %: 1.2 %
Eosinophil #: 0.2 x10 3/mm (ref 0.0–0.7)
Eosinophil %: 4.8 %
HCT: 34.9 % — ABNORMAL LOW (ref 35.0–47.0)
HGB: 12.1 g/dL (ref 12.0–16.0)
Lymphocyte #: 0.9 x10 3/mm — ABNORMAL LOW (ref 1.0–3.6)
Lymphocyte %: 25.7 %
MCH: 33.9 pg (ref 26.0–34.0)
MCHC: 34.8 g/dL (ref 32.0–36.0)
MCV: 98 fL (ref 80–100)
Monocyte #: 0.3 x10 3/mm (ref 0.2–0.9)
Monocyte %: 7.6 %
Neutrophil #: 2 x10 3/mm (ref 1.4–6.5)
Neutrophil %: 60.7 %
Platelet: 155 x10 3/mm (ref 150–440)
RBC: 3.57 10*6/uL — ABNORMAL LOW (ref 3.80–5.20)
RDW: 13.8 % (ref 11.5–14.5)
WBC: 3.3 x10 3/mm — ABNORMAL LOW (ref 3.6–11.0)

## 2013-06-26 LAB — BASIC METABOLIC PANEL
Anion Gap: 6 — ABNORMAL LOW (ref 7–16)
BUN: 16 mg/dL (ref 7–18)
Calcium, Total: 9 mg/dL (ref 8.5–10.1)
Chloride: 102 mmol/L (ref 98–107)
Co2: 32 mmol/L (ref 21–32)
Creatinine: 0.85 mg/dL (ref 0.60–1.30)
EGFR (African American): >60
EGFR (Non-African Amer.): >60
Glucose: 121 mg/dL — ABNORMAL HIGH (ref 65–99)
Osmolality: 282 (ref 275–301)
Potassium: 3.7 mmol/L (ref 3.5–5.1)
Sodium: 140 mmol/L (ref 136–145)

## 2013-06-26 LAB — LACTATE DEHYDROGENASE: LDH: 161 U/L

## 2013-07-19 ENCOUNTER — Ambulatory Visit: Payer: Self-pay | Admitting: Oncology

## 2013-09-30 ENCOUNTER — Ambulatory Visit: Payer: Self-pay | Admitting: Oncology

## 2013-09-30 LAB — CBC CANCER CENTER
Basophil #: 0 x10 3/mm (ref 0.0–0.1)
Basophil %: 0.7 %
Eosinophil #: 0.1 x10 3/mm (ref 0.0–0.7)
Eosinophil %: 2 %
HCT: 38.6 % (ref 35.0–47.0)
HGB: 13.2 g/dL (ref 12.0–16.0)
Lymphocyte #: 1.7 x10 3/mm (ref 1.0–3.6)
Lymphocyte %: 35.5 %
MCH: 34.8 pg — ABNORMAL HIGH (ref 26.0–34.0)
MCHC: 34.3 g/dL (ref 32.0–36.0)
MCV: 102 fL — ABNORMAL HIGH (ref 80–100)
Monocyte #: 0.3 x10 3/mm (ref 0.2–0.9)
Monocyte %: 7.1 %
Neutrophil #: 2.7 x10 3/mm (ref 1.4–6.5)
Neutrophil %: 54.7 %
Platelet: 208 x10 3/mm (ref 150–440)
RBC: 3.8 10*6/uL (ref 3.80–5.20)
RDW: 13.6 % (ref 11.5–14.5)
WBC: 4.9 x10 3/mm (ref 3.6–11.0)

## 2013-09-30 LAB — LACTATE DEHYDROGENASE: LDH: 214 U/L (ref 81–246)

## 2013-10-19 ENCOUNTER — Ambulatory Visit: Payer: Self-pay | Admitting: Oncology

## 2014-01-05 ENCOUNTER — Ambulatory Visit: Payer: Self-pay | Admitting: Oncology

## 2014-01-06 ENCOUNTER — Ambulatory Visit: Payer: Self-pay | Admitting: Family Medicine

## 2014-01-06 LAB — CBC CANCER CENTER
Basophil #: 0.1 x10 3/mm (ref 0.0–0.1)
Basophil %: 2 %
Eosinophil #: 0.1 x10 3/mm (ref 0.0–0.7)
Eosinophil %: 1.9 %
HCT: 32.7 % — ABNORMAL LOW (ref 35.0–47.0)
HGB: 11.6 g/dL — ABNORMAL LOW (ref 12.0–16.0)
Lymphocyte #: 1.4 x10 3/mm (ref 1.0–3.6)
Lymphocyte %: 40.5 %
MCH: 36.6 pg — ABNORMAL HIGH (ref 26.0–34.0)
MCHC: 35.6 g/dL (ref 32.0–36.0)
MCV: 103 fL — ABNORMAL HIGH (ref 80–100)
Monocyte #: 0.3 x10 3/mm (ref 0.2–0.9)
Monocyte %: 7.9 %
Neutrophil #: 1.6 x10 3/mm (ref 1.4–6.5)
Neutrophil %: 47.7 %
Platelet: 176 x10 3/mm (ref 150–440)
RBC: 3.18 10*6/uL — ABNORMAL LOW (ref 3.80–5.20)
RDW: 12.7 % (ref 11.5–14.5)
WBC: 3.5 x10 3/mm — ABNORMAL LOW (ref 3.6–11.0)

## 2014-01-06 LAB — CREATININE, SERUM
Creatinine: 1.01 mg/dL (ref 0.60–1.30)
EGFR (African American): >60
EGFR (Non-African Amer.): 56 — ABNORMAL LOW

## 2014-01-06 LAB — LACTATE DEHYDROGENASE: LDH: 154 U/L (ref 81–246)

## 2014-01-14 DIAGNOSIS — M5030 Other cervical disc degeneration, unspecified cervical region: Secondary | ICD-10-CM | POA: Insufficient documentation

## 2014-01-14 DIAGNOSIS — I10 Essential (primary) hypertension: Secondary | ICD-10-CM | POA: Insufficient documentation

## 2014-01-14 DIAGNOSIS — M19042 Primary osteoarthritis, left hand: Secondary | ICD-10-CM | POA: Insufficient documentation

## 2014-01-14 DIAGNOSIS — E785 Hyperlipidemia, unspecified: Secondary | ICD-10-CM | POA: Insufficient documentation

## 2014-01-14 DIAGNOSIS — M19041 Primary osteoarthritis, right hand: Secondary | ICD-10-CM | POA: Insufficient documentation

## 2014-01-19 ENCOUNTER — Ambulatory Visit: Payer: Self-pay | Admitting: Oncology

## 2014-06-05 ENCOUNTER — Ambulatory Visit
Admit: 2014-06-05 | Disposition: A | Discharge status: Home / Self Care | Payer: Self-pay | Attending: Oncology | Admitting: Oncology

## 2014-06-20 ENCOUNTER — Ambulatory Visit
Admit: 2014-06-20 | Disposition: A | Discharge status: Home / Self Care | Payer: Self-pay | Attending: Oncology | Admitting: Oncology

## 2014-07-11 NOTE — H&P (Signed)
PATIENT NAME:  Jacqueline Russo, Jacqueline Russo MR#:  [id number] DATE OF BIRTH:  Dec 13, 1929  DATE OF ADMISSION:  10/20/2012  HISTORY OF PRESENT ILLNESS:  Jacqueline Russo is an 81 year old white female who had an alcoholic beverage and slipped down half a flight of stairs and hit her head and her right side of her torso.  She sought attention in the Emergency Department because of severe pain.  She has no amnesia of the event and did not have any loss of consciousness.  Although she hit her lip and her head, she does not complain of any head pain, rather, most of her pain is in her right chest.  She also has pain with abduction of the right hip.  She is able to flex her hip without pain, but when she twists it or tries to bear weight on it, it hurts.   PAST MEDICAL HISTORY:  Myelodysplasia, hypertension, dyslipidemia, ovarian cancer age 89 (status post TAH/BSO and radiation therapy, but no chemotherapy), osteoporosis, cataracts.   MEDICATIONS:  Amlodipine 5 mg daily, calcium 600 plus vitamin D daily, Crestor 10 mg daily, Diovan hydrochlorothiazide 320/25 daily, Evista 60 mg daily, lisinopril 20 mg daily.   ALLERGIES:  None.  THE PATIENT DOES HAVE A NAUSEA REACTION TO CODEINE DERIVATIVE ANALGESICS.   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  She quit smoking over 40 years ago and generally has one alcoholic beverage per night.  She lives alone.  She has a trip to [place] planned 12/16/2012.   FAMILY HISTORY:  Noncontributory.   REVIEW OF SYSTEMS:  Negative for 10 systems except as mentioned in the history of present illness above.   PHYSICAL EXAMINATION: GENERAL:  Reveals an elderly white female in no acute distress with some bruising on her lower lip.  Height 5 feet, 0 inches, weight 110 pounds, BMI 21.5.  VITAL SIGNS:  Temperature 98.5, pulse 87, respirations 20, blood pressure 138/63, oxygen saturation 93% on room air at rest.  HEENT:  Head reveals a scalp hematoma in the  occiput slightly to the left as well as a lower lip hematoma.  Pupils are equally round and reactive to light.  Extraocular movements are intact.  There is no diplopia.  The patient's occlusion is normal and her intraoral examination is without trauma.  There is no external ear trauma and hearing is intact to voice.  Her mucous membranes are moist.  NECK:  Supple with no tenderness and there is also no tracheal deviation or jugular venous distention.  HEART:  Regular rate and rhythm with no murmurs or rubs.  LUNGS:  Clear to auscultation with normal respiratory effort bilaterally.  The right lateral rib cage is quite tender to point palpation.  ABDOMEN:  Soft, nontender, nondistended with no palpable hepatosplenomegaly or other masses.  EXTREMITIES:  Normal capillary refill with normal distal pulses in all four extremities and all four extremities are atraumatic.  NEUROLOGIC:  Cranial nerves II through XII, motor and sensation grossly intact.  Glasgow coma scale 15.  PSYCHIATRIC:  Alert and oriented x 4.  Appropriate affect.   LABORATORY, DIAGNOSTIC AND RADIOLOGICAL DATA:  White blood cell count 23, hemoglobin 7.4, hematocrit 22%, platelet count 56,000.  Sodium 130, potassium 3.4, BUN and creatinine are normal.  Calcium is normal.  Alcohol level 0.09.  A CT scan of the head without contrast reveals no acute abnormalities.  Complete right hip series reveals no acute abnormalities.  Chest x-ray and rib details show right lateral  3rd through 6th rib fractures.   ASSESSMENT:  Right 3rd through 6th rib fractures and hip pain in an elderly patient with a recent fall.  The patient has myelodysplasia and she says her hemoglobin does go down into the 7 range occasionally.  Most recently it was approximately 9 and she was offered "a shot," but she refused.   PLAN:  Admit to the hospital for analgesia, orthopedic consult regarding her right hip pain, and physical therapy.  Since the patient lives alone and is  completely independent, she cannot be discharged from the Emergency Department.  I had a long discussion with her regarding the IV and by mouth analgesics and how she would need to take either IV or by mouth Phenergan to combat the nausea that will be associated with them, but if she can do so the nausea may abate after 24 to 48 hours.    ____________________________ Jacqueline Mimes, MD Jacqueline Russo:ea D: 10/20/2012 23:35:52 ET T: 10/20/2012 23:55:59 ET JOB#: [id number]  cc: Jacqueline Mimes, MD, <Dictator> Jacqueline Mimes, MD Jacqueline November. Grayland Ormond, MD Jacqueline Mimes MD ELECTRONICALLY SIGNED 10/21/2012 21:12

## 2014-07-11 NOTE — Consult Note (Signed)
Patient's white blood cell count is elevated, but this is likely reactive.  Her hemoglobin is slightly decreased from her baseline, but she does not require a transfusion at this time.  Transfuse if her hemoglobin falls below 7.0.  Her platelet count is also decreased but approximately her baseline.  No intervention is needed at this time. with questions.  Electronic Signatures: Delight Hoh (MD)  (Signed on 08-Aug-14 16:59)  Authored  Last Updated: 08-Aug-14 16:59 by Delight Hoh (MD)

## 2014-07-11 NOTE — Consult Note (Signed)
PATIENT NAME:  Jacqueline Russo, Jacqueline Russo MR#:  [id number] DATE OF BIRTH:  [birth year]  ORTHOPEDIC CONSULTATION  DATE OF CONSULTATION:  10/21/2012  DATE OF BIRTH: November 03, 1929.   REASON FOR CONSULTATION: Right hip pain, status post fall.   REQUESTING PHYSICIAN:  Molly Maduro.  HISTORY:  Jacqueline Russo is an 82 year old female who sustained a mechanical fall onto the right side. She sustained injuries to her lip, right chest and right hip during the fall. The patient at the bedside today was resting comfortably. She states that she does have rib pain and has pain in the right hip with any movement or attempted weightbearing. She denies any numbness or tingling in the right lower extremity. There were no other injuries or complaints in her extremities.   Patient's past medical history, past surgical history, family and social history as well as medication allergies were reviewed today from the patient's admission history and physical, and the electronic medical record.   PHYSICAL EXAMINATION: GENERAL APPEARANCE: The patient is alert and in no acute distress. She is lying supine in bed with the head of the bed elevated approximately 70 degrees. The patient has intact skin. She had a mild contusion over the anterior side midway at the mid-point. This contusion is approximately 4 cm in diameter. There is no bruising over the hip or pelvis. There is no erythema or ecchymosis over the hip or the pelvis. The patient had minimal tenderness to palpation over the greater trochanter today. She had mild discomfort with deep palpation over the anterior pelvis and the pubis extending superiorly over the anterior hip joint. She can actively flex her hip to approximately 45 degrees. Even with passive assistance she had too much pain to flex much more than that today. She had pain with internal and external rotation with the hip in  45 degrees of flexion. She did not have significant pain in the right hip, however, with logrolling. The  patient had intact sensation throughout the right lower extremity and palpable pedal pulses. She had intact motor function in all muscle groups of the right lower extremity.   RADIOLOGY: I reviewed the patient's right shoulder and right hip films taken on admission. Her right hip films included AP and lateral views. The patient has a nondisplaced fracture at the base of the superior rami extending horizontally into the acetabulum, involving only the medial wall. There is no fracture of the superior acetabulum. Her femoral head is well-covered by the acetabulum. There is no evidence of femoral neck or intertrochanteric hip fracture. The patient has a soft tissue shadow consistent with edema.   The right shoulder films show advanced degenerative changes in the glenohumeral joint. There is joint space narrowing and a large osteophyte on the inferior aspect of the humeral head. There is no evidence of humeral or glenoid fractures. She has advanced degenerative changes of the [facility] joint as well. There is evidence of chronic impingement.   ASSESSMENT: Right superior rami fracture of the pelvis, extending into the acetabulum, nondisplaced.   PLAN: I explained to Jacqueline Russo about her injury. I drew a diagram on the white board in the patient's room to explain her injury. This is a nonoperative problem. I recommend that she get a physical therapy evaluation. She should be partial weightbearing on the right lower extremity for at least 4 to 6 weeks. She will need to use a walker for assistance with ambulation. Given her rib fractures she may have difficulty weightbearing through her upper extremity. She also has chronic  arthritis in the right shoulder, which may limit her ability to use a walker.   Given the concomitant injuries of rib fracture and pelvis fracture she will likely not be able to work to independent living. The patient lives alone at baseline. I am recommending that she be screened for a skilled nursing  facility. I will see her back in my office in 4 to 6 weeks for re-evaluation and x-ray to confirm bone healing of the pelvis fractures. If the patient is unable to get out of bed and is going to be on bed rest for prolonged periods of time I recommend consideration be given to DVT prophylaxis, which she may need to continue that at [facility] after discharge.     ____________________________ Timoteo Gaul, MD klk:dm D: 10/21/2012 12:16:45 ET T: 10/21/2012 13:38:42 ET JOB#: [id number]  cc: Timoteo Gaul, MD, <Dictator> Timoteo Gaul MD ELECTRONICALLY SIGNED 11/01/2012 16:58

## 2014-07-11 NOTE — Consult Note (Signed)
History of Present Illness:  Reason for Consult MDS, now with fractured pelvis.   HPI   Patient admitted to the hospital over the weekend after falling down her stairs and fracturing her pelvis.  She was also noted to have a significantly decreased hemoglobin worse than her baseline.  Her white blood cell count and platelet count are decreased, but approximately unchanged.  Patient still having pain, but states it is significantly better controlled. She otherwise feels well.  She does not complain of weakness or fatigue.  She has had no recent fevers.  She denies any weight loss.  She has no neurologic complaints.  She denies any chest pain or shortness of breath.  She denies any nausea, vomiting, constipation, or diarrhea.  She has no urinary complaints.  Patient offers no further specific complaints today.  PFSH:  Additional Past Medical and Surgical History Hypertension, hyperlipidemia, ovarian cancer (patient received XRT, but no chemotherapy), osteoporosis, cataracts. Total abdominal hysterectomy, appendectomy.  Family history: Mother died at 86 of CVA.  Father with prostate and head and neck cancer.  Social history:  Patient is a retired Automotive engineer of foreign languages.  Positive tobacco, quit greater than 40 years ago.  Occasional alcohol.   Review of Systems:  Performance Status (ECOG) 0  2   Review of Systems   As per HPI. Otherwise, 10 point system review was negative.   NURSING NOTES: **Vital Signs.:   04-Aug-14 12:54   Vital Signs Type: Routine   Temperature Temperature (F): 99   Celsius: 37.2   Temperature Source: oral   Pulse Pulse: 88   Respirations Respirations: 20   Systolic BP Systolic BP: 948   Diastolic BP (mmHg) Diastolic BP (mmHg): 71   Mean BP: 88   Pulse Ox % Pulse Ox %: 95   Oxygen Delivery: High Flow Nasal Cannula   Physical Exam:  Physical Exam General: Well-developed, well-nourished, no acute distress. Eyes: Pink conjunctiva,  anicteric sclera. HEENT: Normocephalic, moist mucous membranes, clear oropharnyx. Lungs: Clear to auscultation bilaterally. Heart: Regular rate and rhythm. No rubs, murmurs, or gallops. Abdomen: Soft, nontender, nondistended. No organomegaly noted, normoactive bowel sounds. Musculoskeletal: No edema, cyanosis, or clubbing. Neuro: Alert, answering all questions appropriately. Cranial nerves grossly intact. Skin: No rashes or petechiae noted. Psych: Normal affect.    No Known Allergies:     lisinopril tablet 20 mg: 1 tab(s) orally once a day x 30 days , Status: Active, Quantity: 30, Refills: None   Calcium 600 +D: 1 cap(s) orally once a day, Status: Active, Quantity: 0, Refills: None   Crestor 10 mg oral tablet: 1  orally  , Status: Active, Quantity: 0, Refills: None   Evista tablet 60 mg: 1 tab(s) orally once a day , Status: Active, Quantity: 30, Refills: None   Diovan HCT 320 mg-25 mg oral tablet: 1  orally once a day , Status: Active, Quantity: 0, Refills: None   amlodipine 5 mg oral tablet: 1  orally once a day , Status: Active, Quantity: 0, Refills: None  Laboratory Results: Hepatic:  03-Aug-14 04:15   Bilirubin, Total 0.4  Alkaline Phosphatase 66  SGPT (ALT) 32  SGOT (AST) 34  Total Protein, Serum 6.6  Albumin, Serum  3.3  Routine Chem:  03-Aug-14 04:15   Result Comment WBC DIFF - SMEAR SCANNED  Result(s) reported on 21 Oct 2012 at 05:34AM.  Glucose, Serum  202  BUN 15  Creatinine (comp) 0.74  Sodium, Serum  129  Potassium, Serum 3.9  Chloride,  Serum  96  CO2, Serum 27  Calcium (Total), Serum  8.2  Osmolality (calc) 266  eGFR (African American) >60  eGFR (Non-African American) >60 (eGFR values <36m/min/1.73 m2 may be an indication of chronic kidney disease (CKD). Calculated eGFR is useful in patients with stable renal function. The eGFR calculation will not be reliable in acutely ill patients when serum creatinine is changing rapidly. It is not useful in   patients on dialysis. The eGFR calculation may not be applicable to patients at the low and high extremes of body sizes, pregnant women, and vegetarians.)  Anion Gap  6  Magnesium, Serum  1.4 (1.8-2.4 THERAPEUTIC RANGE: 4-7 mg/dL TOXIC: > 10 mg/dL  -----------------------)  Amylase, Serum 58 (Result(s) reported on 21 Oct 2012 at 05:03AM.)  Lipase 269 (Result(s) reported on 21 Oct 2012 at 05:03AM.)  Routine Coag:  03-Aug-14 04:15   Prothrombin 14.1  INR 1.1 (INR reference interval applies to patients on anticoagulant therapy. A single INR therapeutic range for coumarins is not optimal for all indications; however, the suggested range for most indications is 2.0 - 3.0. Exceptions to the INR Reference Range may include: Prosthetic heart valves, acute myocardial infarction, prevention of myocardial infarction, and combinations of aspirin and anticoagulant. The need for a higher or lower target INR must be assessed individually. Reference: The Pharmacology and Management of the Vitamin K  antagonists: the seventh ACCP Conference on Antithrombotic and Thrombolytic Therapy. COYDXA.1287Sept:126 (3suppl): [id number] A HCT value >55% may artifactually increase the PT.  In one study,  the increase was an average of 25%. Reference:  "Effect on Routine and Special Coagulation Testing Values of Citrate Anticoagulant Adjustment in Patients with High HCT Values." American Journal of Clinical Pathology 2006;126:400-405.)  Activated PTT (APTT) 25.8 (A HCT value >55% may artifactually increase the APTT. In one study, the increase was an average of 19%. Reference: "Effect on Routine and Special Coagulation Testing Values of Citrate Anticoagulant Adjustment in Patients with High HCT Values." American Journal of Clinical Pathology 2006;126:400-405.)  Routine Hem:  03-Aug-14 04:15   WBC (CBC)  18.1  RBC (CBC)  1.97  Hemoglobin (CBC)  6.7  Hematocrit (CBC)  19.1  Platelet Count (CBC)  50  MCV 97   MCH  34.1  MCHC 35.2  RDW 14.3  Neutrophil % 29.4  Lymphocyte % 63.6  Monocyte % 5.1  Eosinophil % 0.6  Basophil % 1.3  Neutrophil # 5.3  Lymphocyte #  11.5  Monocyte # 0.9  Eosinophil # 0.1  Basophil #  0.2   Radiology Results: CT:    04-Aug-14 10:38, CT Chest With Contrast  CT Chest With Contrast   REASON FOR EXAM:    Rib fx, elevated hemidiaphragm  COMMENTS:       PROCEDURE: CT  - CT CHEST WITH CONTRAST  - Oct 22 2012 10:38AM     RESULT: Axial CT scanning was performed through the chest with   reconstructions at 3 mm intervals and slice thicknesses following   intravenous administration of 75 cc of Isovue-370.    The cardiac chambers are mildly enlarged. The caliber of the thoracic   aorta is normal. As best as can be determined contrast within the   pulmonary arterial tree is normal. There is is dense consolidation of   portions of both lower lobes. There is a small right pleural effusion and   trace left pleural effusion. There are borderline to mildly enlarged   mesenteric and hilar lymph nodes bilaterally.  Within the upper abdomenthe spleen is only partially included in the   field-of-view. It is enlarged and demonstrates heterogeneous enhancement.   There is likely a cyst posteriorly in the right hepatic lobe measuring   4.5 by 2.9 cm. No adrenal masses are demonstrated. There are small   amounts of fluid in the portions of the perinephric spaces included in   the field-of-view. The gallbladder is partially included in the   field-of-view and exhibits no calcified stones. The right hemidiaphragm   does appear higher thanthe left. A tiny amount of gas is noted on the   lowermost image of the study which is image 108 lying just posterior to   the gallbladder and just inferior to the right hepatic lobe. This may be   related to bowel but it cannot be characterized further.    There is a fracture of the posterior aspects of the right 4th through the   11th ribs.  There is no evidence of a pneumothorax or pneumomediastinum.  IMPRESSION:   1. There is dense consolidation of portions of both lower lobes. There is   a small right pleural effusion and trace left pleural effusion. No   pneumothorax or pneumomediastinum is demonstrated.  2. Multiple right rib fractures are present.  3. There is splenomegaly and there is heterogeneous enhancement the   visualized portions of the spleen. Splenic injury cannot be absolutely   excluded.  4. No hepatic laceration is demonstrated. There are hypodensities in the   liver most compatible with cysts.  5. There is no evidence of an acute pulmonary embolism. There are   enlarged mediastinal and hilar lymph nodes.   6. A tiny amount of gas is noted along the undersurface of the liver on   the lowermost image of the study. This may be related to bowel however.   CT scanning of the abdomen and pelvis would be of value.   Dictation Site: 1        Verified By: DAVID A. Martinique, M.D., MD   Assessment and Plan: Impression:   MDS, fractured pelvis Plan:   1. MDS: Patient's white blood cell count is significantly elevated, but this is likely reactive.  Her platelets are decreased but approximately at her baseline.  Hemoglobin is significantly decreased, but I do not believe this is progression of her MDS rather than acute bleed, possibly in her spleen.  Patient will give 1 unit of packed red blood cells today and then recheck hemoglobin in the morning.  If patient requires surgery, would have to give platelet transfusion.  This is not necessary at this poin.  Patient will likely need rehabilitation since she lives alone, she has been instructed to keep her previously scheduled followup appointment in the [facility]. consult, will follow.  CC Referral:  cc: Dr. Ilene Qua   Electronic Signatures: Delight Hoh (MD)  (Signed 04-Aug-14 16:45)  Authored: HISTORY OF PRESENT ILLNESS, PFSH, ROS, NURSING NOTES, PE,  ALLERGIES, HOME MEDICATIONS, LABS, OTHER RESULTS, ASSESSMENT AND PLAN, CC Referring Physician   Last Updated: 04-Aug-14 16:45 by Delight Hoh (MD)

## 2014-07-11 NOTE — Consult Note (Signed)
Brief Consult Note: Diagnosis: Right nondisplaced fracture of the base of the superior rami of the pelvis extending into the acetabulum.   Patient was seen by consultant.   Discussed with Attending MD.   Comments: Patient is an 70 with hip pain with movement or weight bearing after a fall.   I saw a nondisplaced fracture at the base of the superior rami and spoke with Dr. Burt Knack our radiologist who agrees that the fracture is present.  Would recommend symptomatic treatment for her pain and partial WB on the right side for 4-6 weeks.  I agree with PT consult.  Patient will likely need a rehav stay given that she lives alone and has concomitant rib and pelvis fractures.  She may follow up in my clinic in 4-6 weeks for followup.  Consider DVT prophylaxis if patient is going to be bedbound for extended period of time.  Electronic Signatures: Thornton Park (MD)  (Signed 03-Aug-14 12:09)  Authored: Brief Consult Note   Last Updated: 03-Aug-14 12:09 by Thornton Park (MD)

## 2014-07-11 NOTE — Consult Note (Signed)
PATIENT NAME:  Jacqueline Russo, Jacqueline Russo MR#:  [id number] DATE OF BIRTH:  May 21, 1929  DATE OF CONSULTATION:  10/22/2012  REQUESTING PHYSICIAN:  Dr. Dia Crawford.  CONSULTING PHYSICIAN:  Amalea Ottey E. Sanyiah Kanzler, MD  REASON FOR CONSULTATION: Right-sided rib fractures.   I have personally seen and examined Jacqueline Russo. I have discussed her care with  Dr. Delight Hoh and Dr. Bronson Ing. I have independently reviewed her medical record and her x-ray findings.   HISTORY OF PRESENT ILLNESS: Jacqueline Russo is an 81 year old white female who was brought into the hospital on 10/20/2012 after she fell down a half-flight of stairs, hitting her head and the right side of her chest. When she was brought into the Emergency Department she was complaining of severe pain in her hip and side. She denied any loss of consciousness, and while she was in the Emergency Department x-rays were made, including a CT scan of the head, chest x-rays, rib films and pelvic films. These revealed a fracture of her pelvis, which was treated non-operatively. In addition, she had multiple rib fractures on the right side, but no evidence of pleural effusion or pneumothorax.   She was admitted to the hospital where she has undergone conservative management with analgesics and incentive spirometry. In addition, she was seen by our orthopedic colleagues, and they recommend that she be able to manage this non-operatively with plans for nonweightbearing status.   At the present time the patient's oxygen saturations were running in the high 80s to 90 on high-flow nasal cannula. She does not complain of any shortness of breath. She feels that she is unable to take a deep breath, however, because of some abdominal distention. She has minimal pain in her ribs, but does complain of some mild pain in her abdomen. She does not complain of any left upper quadrant pain.   PAST MEDICAL HISTORY: Her past medical history significant for myelodysplasia, hypertension,  dyslipidemia, and ovarian cancer. She has previously undergone a lower midline incision with a hysterectomy.     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 She quit smoking 4 years ago. She lives alone.   FAMILY HISTORY: Noncontributory.   REVIEW OF SYSTEMS: Is as per history of present illness, and all other review of systems were asked and were negative.   PHYSICAL EXAMINATION: GENERAL: Revealed a pleasant, elderly white female in no acute distress. Her oxygen saturations were 90% on high-flow nasal cannula.  HEENT: Exam revealed the head to be normocephalic. There was some bruising on the lower aspect of her right lower jaw. Her extraocular movements were intact.  NECK: Supple, without thyromegaly or adenopathy. There was no jugular venous distention.  LUNGS: Her lungs were clear bilaterally.  HEART: Her heart was regular.  ABDOMEN: Her abdomen was soft, somewhat distended and tympanic. She did complain of some fullness in her suprapubic area with palpation. She did not endorse any pain in the other areas of palpation. She does have some bruising on the right lateral chest wall and over the right hip.  EXTREMITIES: Her extremities revealed normal pulses throughout. There was no clubbing, cyanosis, or edema.   ASSESSMENT AND PLAN: I have independently reviewed the x-rays and CT scan. There are several rib fractures on the right-hand side. I did not see any pneumothorax. There are minimal pleural effusions bilaterally. There is no great vessel trauma. There is some borderline mediastinal lymphadenopathy. In addition, the spleen is somewhat heterogeneous in its appearance. This may be  related to her myelodysplasia. Alternatively, splenic trauma should be considered.   IMPRESSIONS: Multiple rib fractures after trauma.   I would recommend that she be placed on an appropriate analgesic regimen as you have. She should use her incentive spirometer at least  10 times every hour. An attempt should be made to make her as mobile as possible.   I would like to discuss her care with Dr. Delight Hoh. He has been following her for her myelodysplasia. He can give [place] some input as to how best to manage her thrombocytopenia and anemia.   Thank you very much for allowing me to participate in her care.    ____________________________ Lew Dawes. Genevive Bi, MD teo:dm D: 10/22/2012 12:21:26 ET T: 10/22/2012 12:46:48 ET JOB#: [id number]  cc: Christia Reading E. Genevive Bi, MD, <Dictator> Louis Matte MD ELECTRONICALLY SIGNED 10/24/2012 10:40

## 2014-07-11 NOTE — Discharge Summary (Signed)
PATIENT NAME:  Jacqueline Russo, Jacqueline Russo MR#:  [id number] DATE OF BIRTH:  1929/08/13  DATE OF ADMISSION:  10/20/2012 DATE OF DISCHARGE:  10/30/2012  PRINCIPAL DIAGNOSIS: Fall with rib fractures (right third through sixth).   OTHER DIAGNOSES: 1.  Right acetabular fracture, nondisplaced.  2.  Myelodysplasia.  3.  Hypertension.  4.  Dyslipidemia.  5. History of ovarian cancer, age 81 (status post total abdominal hysterectomy with bilateral salpingo-oophorectomy and radiation therapy but no chemotherapy).  6.  Osteoporosis.  7.  Cataracts.   HOSPITAL COURSE: [name] was admitted to the hospital for pain management, nasal cannula oxygen therapy, respiratory therapy including incentive spirometry and physical therapy. She ultimately had her oxygen therapy weaned to 4 liters nasal cannula and was ambulatory with the assistance of a walker and partial weight-bearing on the right side. Her pain was controlled with Norco. She is discharged today to a rehab facility and her discharge instructions are as follows: 1.  Partial weight-bearing on the right lower extremity.  2.  Continue physical therapy.  3.  Four liters nasal cannula oxygen.  4.  Regular diet.  DISCHARGE MEDICATIONS: 1.  Norvasc 5 mg daily. 2.  Zestril 20 mg daily.  3.  Evista 60 mg daily.  4.  Crestor 10 mg at bedtime. 5.  Diovan-HCT 320-25 mg daily. 6.  Calcium 600 + D daily. 7.  Norco 1 to 2 (325/5 mg) tablets q. 4 hours p.r.n.   The patient did not require any follow-up with general surgery, and has regularly scheduled follow-up with Dr. Alyssa Grove in hematology for her myelodysplasia. ____________________________ Consuela Mimes, MD wfm:sb D: 10/30/2012 13:43:06 ET T: 10/30/2012 14:07:08 ET JOB#: [id number]  cc: Consuela Mimes, MD, <Dictator> Consuela Mimes MD ELECTRONICALLY SIGNED 10/30/2012 17:30

## 2014-07-22 ENCOUNTER — Other Ambulatory Visit: Payer: Self-pay | Admitting: Surgery

## 2014-07-22 DIAGNOSIS — M19011 Primary osteoarthritis, right shoulder: Secondary | ICD-10-CM

## 2014-07-31 ENCOUNTER — Ambulatory Visit
Admission: RE | Admit: 2014-07-31 | Discharge: 2014-07-31 | Disposition: A | Discharge status: Home / Self Care | Payer: Medicare Other | Source: Ambulatory Visit | Attending: Surgery | Admitting: Surgery

## 2014-07-31 DIAGNOSIS — M19011 Primary osteoarthritis, right shoulder: Secondary | ICD-10-CM | POA: Diagnosis not present

## 2014-07-31 DIAGNOSIS — M75101 Unspecified rotator cuff tear or rupture of right shoulder, not specified as traumatic: Secondary | ICD-10-CM | POA: Diagnosis not present

## 2014-08-08 DIAGNOSIS — M75121 Complete rotator cuff tear or rupture of right shoulder, not specified as traumatic: Secondary | ICD-10-CM | POA: Insufficient documentation

## 2014-08-28 ENCOUNTER — Telehealth: Payer: Self-pay | Admitting: *Deleted

## 2014-08-28 DIAGNOSIS — C9110 Chronic lymphocytic leukemia of B-cell type not having achieved remission: Secondary | ICD-10-CM

## 2014-08-28 MED ORDER — HYDROCODONE-ACETAMINOPHEN 5-325 MG PO TABS: 1.0000 | ORAL_TABLET | Freq: Three times a day (TID) | ORAL | Status: DC | PRN

## 2014-08-28 NOTE — Telephone Encounter (Signed)
Pt needs refill for hydrocodone. Pt aware to come pick Rx at the clinic.

## 2014-09-26 ENCOUNTER — Other Ambulatory Visit: Payer: Self-pay

## 2014-09-26 ENCOUNTER — Other Ambulatory Visit: Payer: Self-pay | Admitting: *Deleted

## 2014-09-26 ENCOUNTER — Telehealth: Payer: Self-pay | Admitting: Oncology

## 2014-09-26 DIAGNOSIS — C9110 Chronic lymphocytic leukemia of B-cell type not having achieved remission: Secondary | ICD-10-CM

## 2014-09-26 MED ORDER — HYDROCODONE-ACETAMINOPHEN 5-325 MG PO TABS: 1.0000 | ORAL_TABLET | Freq: Three times a day (TID) | ORAL | Status: DC | PRN

## 2014-09-26 NOTE — Telephone Encounter (Signed)
She is nearly out of her hydrocodone and is very anxious about getting the Rx today before we close. Please call her when it is ready for her to come pick it up. Thanks!

## 2014-09-26 NOTE — Telephone Encounter (Signed)
Rx and front desk for pick up, patient aware.

## 2014-10-15 ENCOUNTER — Encounter
Admission: RE | Admit: 2014-10-15 | Discharge: 2014-10-15 | Disposition: A | Discharge status: Home / Self Care | Payer: Medicare Other | Source: Ambulatory Visit | Attending: Surgery | Admitting: Surgery

## 2014-10-15 DIAGNOSIS — Z0183 Encounter for blood typing: Secondary | ICD-10-CM | POA: Diagnosis not present

## 2014-10-15 DIAGNOSIS — I10 Essential (primary) hypertension: Secondary | ICD-10-CM | POA: Insufficient documentation

## 2014-10-15 DIAGNOSIS — M19011 Primary osteoarthritis, right shoulder: Secondary | ICD-10-CM | POA: Diagnosis not present

## 2014-10-15 DIAGNOSIS — Z01812 Encounter for preprocedural laboratory examination: Secondary | ICD-10-CM | POA: Diagnosis not present

## 2014-10-15 HISTORY — DX: Malignant neoplasm of unspecified ovary: C56.9

## 2014-10-15 HISTORY — DX: Anemia, unspecified: D64.9

## 2014-10-15 HISTORY — DX: Chronic lymphocytic leukemia of B-cell type not having achieved remission: C91.10

## 2014-10-15 HISTORY — DX: Unspecified osteoarthritis, unspecified site: M19.90

## 2014-10-15 HISTORY — DX: Essential (primary) hypertension: I10

## 2014-10-15 LAB — BASIC METABOLIC PANEL
Anion gap: 11 (ref 5–15)
BUN: 16 mg/dL (ref 6–20)
CO2: 26 mmol/L (ref 22–32)
Calcium: 9.8 mg/dL (ref 8.9–10.3)
Chloride: 103 mmol/L (ref 101–111)
Creatinine, Ser: 0.73 mg/dL (ref 0.44–1.00)
GFR calc Af Amer: >60 mL/min (ref >60)
GFR calc non Af Amer: >60 mL/min (ref >60)
Glucose, Bld: 146 mg/dL — ABNORMAL HIGH (ref 65–99)
Potassium: 3.9 mmol/L (ref 3.5–5.1)
Sodium: 140 mmol/L (ref 135–145)

## 2014-10-15 LAB — URINALYSIS COMPLETE WITH MICROSCOPIC (ARMC ONLY)
Bilirubin Urine: NEGATIVE
Glucose, UA: NEGATIVE mg/dL
Hgb urine dipstick: NEGATIVE
Ketones, ur: NEGATIVE mg/dL
Nitrite: POSITIVE — AB
Protein, ur: NEGATIVE mg/dL
Specific Gravity, Urine: 1.01 (ref 1.005–1.030)
pH: 7 (ref 5.0–8.0)

## 2014-10-15 LAB — TYPE AND SCREEN
ABO/RH(D): AB POS
Antibody Screen: NEGATIVE

## 2014-10-15 LAB — SURGICAL PCR SCREEN
MRSA, PCR: NEGATIVE
Staphylococcus aureus: NEGATIVE

## 2014-10-15 LAB — SEDIMENTATION RATE: Sed Rate: 43 mm/hr — ABNORMAL HIGH (ref 0–30)

## 2014-10-15 LAB — CBC
HCT: 27.9 % — ABNORMAL LOW (ref 35.0–47.0)
Hemoglobin: 9.7 g/dL — ABNORMAL LOW (ref 12.0–16.0)
MCH: 38.2 pg — ABNORMAL HIGH (ref 26.0–34.0)
MCHC: 34.9 g/dL (ref 32.0–36.0)
MCV: 109.5 fL — ABNORMAL HIGH (ref 80.0–100.0)
Platelets: 249 10*3/uL (ref 150–440)
RBC: 2.55 MIL/uL — ABNORMAL LOW (ref 3.80–5.20)
RDW: 15.1 % — ABNORMAL HIGH (ref 11.5–14.5)
WBC: 4.2 10*3/uL (ref 3.6–11.0)

## 2014-10-15 LAB — PROTIME-INR
INR: 1.02
Prothrombin Time: 13.6 seconds (ref 11.4–15.0)

## 2014-10-15 LAB — APTT: aPTT: 25 seconds (ref 24–36)

## 2014-10-15 LAB — ABO/RH: ABO/RH(D): AB POS

## 2014-10-15 NOTE — Patient Instructions (Signed)
  Your procedure is scheduled on: October 28, 2014 (Tuesday) Report to Day Surgery. To find out your arrival time please call [phone] between 1PM - 3PM on October 27, 2014 (Monday).  Remember: Instructions that are not followed completely may result in serious medical risk, up to and including death, or upon the discretion of your surgeon and anesthesiologist your surgery may need to be rescheduled.    __x__ 1. Do not eat food or drink liquids after midnight. No gum chewing or hard candies.     __x__ 2. No Alcohol for 24 hours before or after surgery.   ____ 3. Bring all medications with you on the day of surgery if instructed.    __x__ 4. Notify your doctor if there is any change in your medical condition     (cold, fever, infections).     Do not wear jewelry, make-up, hairpins, clips or nail polish.  Do not wear lotions, powders, or perfumes. You may wear deodorant.  Do not shave 48 hours prior to surgery. Men may shave face and neck.  Do not bring valuables to the hospital.    [facility] is not responsible for any belongings or valuables.               Contacts, dentures or bridgework may not be worn into surgery.  Leave your suitcase in the car. After surgery it may be brought to your room.  For patients admitted to the hospital, discharge time is determined by your                treatment team.   Patients discharged the day of surgery will not be allowed to drive home.   Please read over the following fact sheets that you were given:   MRSA Information and Surgical Site Infection Prevention   ____ Take these medicines the morning of surgery with A SIP OF WATER:    1. Lisinopril  2.   3.   4.  5.  6.  ____ Fleet Enema (as directed)   _x___ Use CHG Soap as directed  ____ Use inhalers on the day of surgery  ____ Stop metformin 2 days prior to surgery    ____ Take 1/2 of usual insulin dose the night before surgery and none on the morning of surgery.   ____ Stop  Coumadin/Plavix/aspirin on   ____ Stop Anti-inflammatories on    ____ Stop supplements until after surgery.    ____ Bring C-Pap to the hospital.

## 2014-10-17 LAB — URINE CULTURE: Culture: >=100000

## 2014-10-17 NOTE — Pre-Procedure Instructions (Signed)
Urine culture results called and faxed to Dr. Roland Rack office, results given to [facility]

## 2014-10-27 ENCOUNTER — Telehealth: Payer: Self-pay | Admitting: *Deleted

## 2014-10-27 DIAGNOSIS — C9110 Chronic lymphocytic leukemia of B-cell type not having achieved remission: Secondary | ICD-10-CM

## 2014-10-27 MED ORDER — HYDROCODONE-ACETAMINOPHEN 5-325 MG PO TABS: 1.0000 | ORAL_TABLET | Freq: Three times a day (TID) | ORAL | Status: DC | PRN

## 2014-10-27 NOTE — Telephone Encounter (Signed)
Informed that prescription is ready to pick up  

## 2014-10-28 ENCOUNTER — Inpatient Hospital Stay: Payer: Medicare Other | Admitting: Certified Registered Nurse Anesthetist

## 2014-10-28 ENCOUNTER — Encounter
Admission: RE | Disposition: A | Discharge status: Skilled Nursing Facility | Payer: Self-pay | Source: Ambulatory Visit | Attending: Surgery

## 2014-10-28 ENCOUNTER — Inpatient Hospital Stay: Payer: Medicare Other

## 2014-10-28 ENCOUNTER — Inpatient Hospital Stay
Admission: RE | Admit: 2014-10-28 | Discharge: 2014-10-30 | DRG: 483 | Disposition: A | Discharge status: Skilled Nursing Facility | Payer: Medicare Other | Source: Ambulatory Visit | Attending: Surgery | Admitting: Surgery

## 2014-10-28 ENCOUNTER — Encounter: Payer: Self-pay | Admitting: *Deleted

## 2014-10-28 DIAGNOSIS — Z9889 Other specified postprocedural states: Secondary | ICD-10-CM | POA: Diagnosis not present

## 2014-10-28 DIAGNOSIS — Z96611 Presence of right artificial shoulder joint: Secondary | ICD-10-CM

## 2014-10-28 DIAGNOSIS — M19011 Primary osteoarthritis, right shoulder: Principal | ICD-10-CM | POA: Diagnosis present

## 2014-10-28 DIAGNOSIS — Z856 Personal history of leukemia: Secondary | ICD-10-CM

## 2014-10-28 DIAGNOSIS — E119 Type 2 diabetes mellitus without complications: Secondary | ICD-10-CM | POA: Diagnosis present

## 2014-10-28 DIAGNOSIS — Z8261 Family history of arthritis: Secondary | ICD-10-CM

## 2014-10-28 DIAGNOSIS — Z9841 Cataract extraction status, right eye: Secondary | ICD-10-CM

## 2014-10-28 DIAGNOSIS — Z9049 Acquired absence of other specified parts of digestive tract: Secondary | ICD-10-CM | POA: Diagnosis present

## 2014-10-28 DIAGNOSIS — Z96619 Presence of unspecified artificial shoulder joint: Secondary | ICD-10-CM

## 2014-10-28 DIAGNOSIS — Z823 Family history of stroke: Secondary | ICD-10-CM | POA: Diagnosis not present

## 2014-10-28 DIAGNOSIS — Z8543 Personal history of malignant neoplasm of ovary: Secondary | ICD-10-CM

## 2014-10-28 DIAGNOSIS — Z79891 Long term (current) use of opiate analgesic: Secondary | ICD-10-CM | POA: Diagnosis not present

## 2014-10-28 DIAGNOSIS — Z9842 Cataract extraction status, left eye: Secondary | ICD-10-CM

## 2014-10-28 DIAGNOSIS — Z90710 Acquired absence of both cervix and uterus: Secondary | ICD-10-CM

## 2014-10-28 DIAGNOSIS — I10 Essential (primary) hypertension: Secondary | ICD-10-CM | POA: Diagnosis present

## 2014-10-28 DIAGNOSIS — K219 Gastro-esophageal reflux disease without esophagitis: Secondary | ICD-10-CM | POA: Diagnosis present

## 2014-10-28 DIAGNOSIS — Z79899 Other long term (current) drug therapy: Secondary | ICD-10-CM | POA: Diagnosis not present

## 2014-10-28 DIAGNOSIS — Z8249 Family history of ischemic heart disease and other diseases of the circulatory system: Secondary | ICD-10-CM

## 2014-10-28 HISTORY — PX: REVERSE SHOULDER ARTHROPLASTY: SHX5054

## 2014-10-28 SURGERY — ARTHROPLASTY, SHOULDER, TOTAL, REVERSE
Anesthesia: Regional | Site: Shoulder | Laterality: Right | Wound class: Clean

## 2014-10-28 MED ORDER — SODIUM CHLORIDE 0.9 % IV SOLN
10000.0000 ug | INTRAVENOUS | Status: DC | PRN
  Administered 2014-10-28: 50 ug/min via INTRAVENOUS

## 2014-10-28 MED ORDER — METOCLOPRAMIDE HCL 5 MG/ML IJ SOLN: 5.0000 mg | Freq: Three times a day (TID) | INTRAMUSCULAR | Status: DC | PRN

## 2014-10-28 MED ORDER — PROPOFOL 10 MG/ML IV BOLUS
INTRAVENOUS | Status: DC | PRN
  Administered 2014-10-28: 50 mg via INTRAVENOUS
  Administered 2014-10-28: 100 mg via INTRAVENOUS

## 2014-10-28 MED ORDER — CEFAZOLIN SODIUM-DEXTROSE 2-3 GM-% IV SOLR
INTRAVENOUS | Status: AC
  Filled 2014-10-28: qty 50

## 2014-10-28 MED ORDER — FENTANYL CITRATE (PF) 100 MCG/2ML IJ SOLN: 25.0000 ug | INTRAMUSCULAR | Status: DC | PRN

## 2014-10-28 MED ORDER — MIDAZOLAM HCL 5 MG/5ML IJ SOLN
INTRAMUSCULAR | Status: AC
  Filled 2014-10-28: qty 5

## 2014-10-28 MED ORDER — HYDROMORPHONE HCL 1 MG/ML IJ SOLN
0.2500 mg | INTRAMUSCULAR | Status: DC | PRN
  Administered 2014-10-28 (×3): 0.5 mg via INTRAVENOUS
  Filled 2014-10-28 (×3): qty 1

## 2014-10-28 MED ORDER — NEOMYCIN-POLYMYXIN B GU 40-200000 IR SOLN
Status: AC
  Filled 2014-10-28: qty 20

## 2014-10-28 MED ORDER — BUPIVACAINE-EPINEPHRINE (PF) 0.5% -1:200000 IJ SOLN
INTRAMUSCULAR | Status: DC | PRN
  Administered 2014-10-28: 30 mL via PERINEURAL

## 2014-10-28 MED ORDER — LISINOPRIL 20 MG PO TABS
40.0000 mg | ORAL_TABLET | ORAL | Status: DC
  Administered 2014-10-29 – 2014-10-30 (×2): 40 mg via ORAL
  Filled 2014-10-28 (×2): qty 2

## 2014-10-28 MED ORDER — FENTANYL CITRATE (PF) 100 MCG/2ML IJ SOLN
50.0000 ug | Freq: Once | INTRAMUSCULAR | Status: AC
  Administered 2014-10-28: 100 ug via INTRAVENOUS

## 2014-10-28 MED ORDER — FENTANYL CITRATE (PF) 100 MCG/2ML IJ SOLN
INTRAMUSCULAR | Status: AC
  Administered 2014-10-28: 100 ug via INTRAVENOUS
  Filled 2014-10-28: qty 2

## 2014-10-28 MED ORDER — KCL IN DEXTROSE-NACL 20-5-0.9 MEQ/L-%-% IV SOLN
INTRAVENOUS | Status: DC
  Administered 2014-10-28 – 2014-10-29 (×3): via INTRAVENOUS
  Filled 2014-10-28 (×7): qty 1000

## 2014-10-28 MED ORDER — LIDOCAINE HCL (PF) 1 % IJ SOLN
INTRAMUSCULAR | Status: AC
  Filled 2014-10-28: qty 5

## 2014-10-28 MED ORDER — TRANEXAMIC ACID 1000 MG/10ML IV SOLN
INTRAVENOUS | Status: DC | PRN
  Administered 2014-10-28: 1000 mg via INTRAVENOUS

## 2014-10-28 MED ORDER — ONDANSETRON HCL 4 MG/2ML IJ SOLN
4.0000 mg | Freq: Four times a day (QID) | INTRAMUSCULAR | Status: DC | PRN
  Administered 2014-10-28 – 2014-10-29 (×3): 4 mg via INTRAVENOUS
  Filled 2014-10-28 (×3): qty 2

## 2014-10-28 MED ORDER — FAMOTIDINE 20 MG PO TABS
ORAL_TABLET | ORAL | Status: AC
  Filled 2014-10-28: qty 1

## 2014-10-28 MED ORDER — TRANEXAMIC ACID 1000 MG/10ML IV SOLN
INTRAVENOUS | Status: AC
Start: 2014-10-28 — End: 2014-10-28
  Filled 2014-10-28: qty 10

## 2014-10-28 MED ORDER — PANTOPRAZOLE SODIUM 40 MG PO TBEC
40.0000 mg | DELAYED_RELEASE_TABLET | Freq: Two times a day (BID) | ORAL | Status: DC
  Administered 2014-10-28 – 2014-10-30 (×4): 40 mg via ORAL
  Filled 2014-10-28 (×4): qty 1

## 2014-10-28 MED ORDER — LACTATED RINGERS IV SOLN
INTRAVENOUS | Status: DC
  Administered 2014-10-28 (×2): via INTRAVENOUS

## 2014-10-28 MED ORDER — HYDROCHLOROTHIAZIDE 25 MG PO TABS
25.0000 mg | ORAL_TABLET | Freq: Every day | ORAL | Status: DC
  Administered 2014-10-29 – 2014-10-30 (×2): 25 mg via ORAL
  Filled 2014-10-28 (×2): qty 1

## 2014-10-28 MED ORDER — ROCURONIUM BROMIDE 100 MG/10ML IV SOLN
INTRAVENOUS | Status: DC | PRN
  Administered 2014-10-28: 10 mg via INTRAVENOUS
  Administered 2014-10-28: 40 mg via INTRAVENOUS
  Administered 2014-10-28: 10 mg via INTRAVENOUS

## 2014-10-28 MED ORDER — ENOXAPARIN SODIUM 40 MG/0.4ML ~~LOC~~ SOLN
40.0000 mg | SUBCUTANEOUS | Status: DC
  Administered 2014-10-29 – 2014-10-30 (×2): 40 mg via SUBCUTANEOUS
  Filled 2014-10-28 (×2): qty 0.4

## 2014-10-28 MED ORDER — ROPIVACAINE HCL 5 MG/ML IJ SOLN
INTRAMUSCULAR | Status: AC
  Filled 2014-10-28: qty 40

## 2014-10-28 MED ORDER — METOCLOPRAMIDE HCL 5 MG PO TABS: 5.0000 mg | ORAL_TABLET | Freq: Three times a day (TID) | ORAL | Status: DC | PRN

## 2014-10-28 MED ORDER — TRAMADOL HCL 50 MG PO TABS: 50.0000 mg | ORAL_TABLET | Freq: Four times a day (QID) | ORAL | Status: DC | PRN

## 2014-10-28 MED ORDER — ZOLPIDEM TARTRATE 5 MG PO TABS: 5.0000 mg | ORAL_TABLET | Freq: Every evening | ORAL | Status: DC | PRN

## 2014-10-28 MED ORDER — NEOSTIGMINE METHYLSULFATE 10 MG/10ML IV SOLN
INTRAVENOUS | Status: DC | PRN
  Administered 2014-10-28: 4 mg via INTRAVENOUS

## 2014-10-28 MED ORDER — CEFAZOLIN SODIUM-DEXTROSE 2-3 GM-% IV SOLR
2.0000 g | Freq: Once | INTRAVENOUS | Status: AC
  Administered 2014-10-28: 2 g via INTRAVENOUS

## 2014-10-28 MED ORDER — BISACODYL 10 MG RE SUPP
10.0000 mg | Freq: Every day | RECTAL | Status: DC | PRN
  Administered 2014-10-30: 10 mg via RECTAL
  Filled 2014-10-28: qty 1

## 2014-10-28 MED ORDER — SODIUM CHLORIDE 0.9 % IV SOLN
INTRAVENOUS | Status: DC | PRN
  Administered 2014-10-28: 60 mL

## 2014-10-28 MED ORDER — FENTANYL CITRATE (PF) 100 MCG/2ML IJ SOLN
INTRAMUSCULAR | Status: DC | PRN
  Administered 2014-10-28: 50 ug via INTRAVENOUS

## 2014-10-28 MED ORDER — NEOMYCIN-POLYMYXIN B GU 40-200000 IR SOLN
Status: DC | PRN
  Administered 2014-10-28: 16 mL

## 2014-10-28 MED ORDER — ACETAMINOPHEN 325 MG PO TABS: 650.0000 mg | ORAL_TABLET | Freq: Four times a day (QID) | ORAL | Status: DC | PRN

## 2014-10-28 MED ORDER — DOCUSATE SODIUM 100 MG PO CAPS
100.0000 mg | ORAL_CAPSULE | Freq: Two times a day (BID) | ORAL | Status: DC
  Administered 2014-10-28 – 2014-10-30 (×4): 100 mg via ORAL
  Filled 2014-10-28 (×4): qty 1

## 2014-10-28 MED ORDER — CEFAZOLIN SODIUM-DEXTROSE 2-3 GM-% IV SOLR
2.0000 g | Freq: Four times a day (QID) | INTRAVENOUS | Status: AC
  Administered 2014-10-28 – 2014-10-29 (×3): 2 g via INTRAVENOUS
  Filled 2014-10-28 (×3): qty 50

## 2014-10-28 MED ORDER — AMLODIPINE BESYLATE 5 MG PO TABS
5.0000 mg | ORAL_TABLET | Freq: Every day | ORAL | Status: DC
  Administered 2014-10-28 – 2014-10-29 (×2): 5 mg via ORAL
  Filled 2014-10-28 (×2): qty 1

## 2014-10-28 MED ORDER — OXYCODONE HCL 5 MG PO TABS
5.0000 mg | ORAL_TABLET | ORAL | Status: DC | PRN
Start: 2014-10-28 — End: 2014-10-30
  Administered 2014-10-28 (×2): 5 mg via ORAL
  Administered 2014-10-28 – 2014-10-30 (×6): 10 mg via ORAL
  Filled 2014-10-28: qty 1
  Filled 2014-10-28 (×2): qty 2
  Filled 2014-10-28: qty 1
  Filled 2014-10-28 (×4): qty 2

## 2014-10-28 MED ORDER — DIPHENHYDRAMINE HCL 12.5 MG/5ML PO ELIX: 12.5000 mg | ORAL_SOLUTION | ORAL | Status: DC | PRN

## 2014-10-28 MED ORDER — HYDROMORPHONE HCL 1 MG/ML IJ SOLN
0.5000 mg | INTRAMUSCULAR | Status: DC | PRN
  Administered 2014-10-29 – 2014-10-30 (×10): 1 mg via INTRAVENOUS
  Filled 2014-10-28 (×10): qty 1

## 2014-10-28 MED ORDER — MAGNESIUM HYDROXIDE 400 MG/5ML PO SUSP: 30.0000 mL | Freq: Every day | ORAL | Status: DC | PRN

## 2014-10-28 MED ORDER — BUPIVACAINE-EPINEPHRINE (PF) 0.5% -1:200000 IJ SOLN
INTRAMUSCULAR | Status: AC
  Filled 2014-10-28: qty 30

## 2014-10-28 MED ORDER — FLEET ENEMA 7-19 GM/118ML RE ENEM: 1.0000 | ENEMA | Freq: Once | RECTAL | Status: DC | PRN

## 2014-10-28 MED ORDER — ONDANSETRON HCL 4 MG/2ML IJ SOLN
INTRAMUSCULAR | Status: DC | PRN
  Administered 2014-10-28: 4 mg via INTRAVENOUS

## 2014-10-28 MED ORDER — BUPIVACAINE HCL (PF) 0.5 % IJ SOLN
INTRAMUSCULAR | Status: DC | PRN
  Administered 2014-10-28: 30 mL via PERINEURAL

## 2014-10-28 MED ORDER — LIDOCAINE HCL (CARDIAC) 20 MG/ML IV SOLN
INTRAVENOUS | Status: DC | PRN
  Administered 2014-10-28: 60 mg via INTRAVENOUS

## 2014-10-28 MED ORDER — ACETAMINOPHEN 650 MG RE SUPP: 650.0000 mg | Freq: Four times a day (QID) | RECTAL | Status: DC | PRN

## 2014-10-28 MED ORDER — BUPIVACAINE LIPOSOME 1.3 % IJ SUSP
INTRAMUSCULAR | Status: AC
  Filled 2014-10-28: qty 20

## 2014-10-28 MED ORDER — DIPHENHYDRAMINE HCL 25 MG PO TABS: 50.0000 mg | ORAL_TABLET | Freq: Every evening | ORAL | Status: DC | PRN

## 2014-10-28 MED ORDER — ONDANSETRON HCL 4 MG PO TABS: 4.0000 mg | ORAL_TABLET | Freq: Four times a day (QID) | ORAL | Status: DC | PRN

## 2014-10-28 MED ORDER — GLYCOPYRROLATE 0.2 MG/ML IJ SOLN
INTRAMUSCULAR | Status: DC | PRN
Start: 2014-10-28 — End: 2014-10-28
  Administered 2014-10-28: 0.2 mg via INTRAVENOUS
  Administered 2014-10-28: .8 mg via INTRAVENOUS

## 2014-10-28 MED ORDER — SODIUM CHLORIDE 0.9 % IJ SOLN
INTRAMUSCULAR | Status: AC
  Filled 2014-10-28: qty 50

## 2014-10-28 MED ORDER — FAMOTIDINE 20 MG PO TABS
20.0000 mg | ORAL_TABLET | Freq: Once | ORAL | Status: AC
  Administered 2014-10-28: 20 mg via ORAL

## 2014-10-28 SURGICAL SUPPLY — 64 items
BAG DECANTER STRL (MISCELLANEOUS) ×2 IMPLANT
BIT DRILL TWIST 2.7 (BIT) ×2 IMPLANT
BLADE SAGITTAL WIDE XTHICK NO (BLADE) ×2 IMPLANT
BOWL CEMENT MIX W/ADAPTER (MISCELLANEOUS) IMPLANT
CAPT SHLDR REVTOTAL 2 ×2 IMPLANT
CATH TRAY METER 16FR LF (MISCELLANEOUS) ×2 IMPLANT
CHLORAPREP W/TINT 26ML (MISCELLANEOUS) ×4 IMPLANT
DECANTER SPIKE VIAL GLASS SM (MISCELLANEOUS) ×6 IMPLANT
DRAPE FLUOR MINI C-ARM 54X84 (DRAPES) IMPLANT
DRAPE IMP U-DRAPE 54X76 (DRAPES) ×4 IMPLANT
DRAPE INCISE IOBAN 66X45 STRL (DRAPES) ×4 IMPLANT
DRAPE INCISE IOBAN 66X60 STRL (DRAPES) ×2 IMPLANT
DRAPE SHEET LG 3/4 BI-LAMINATE (DRAPES) ×4 IMPLANT
DRAPE TABLE BACK 80X90 (DRAPES) ×2 IMPLANT
DRSG OPSITE POSTOP 4X8 (GAUZE/BANDAGES/DRESSINGS) ×2 IMPLANT
ELECT CAUTERY BLADE 6.4 (BLADE) ×2 IMPLANT
GAUZE PACK 2X3YD (MISCELLANEOUS) ×2 IMPLANT
GLOVE BIO SURGEON STRL SZ7.5 (GLOVE) ×4 IMPLANT
GLOVE BIO SURGEON STRL SZ8 (GLOVE) ×4 IMPLANT
GLOVE BIOGEL PI IND STRL 8 (GLOVE) ×5 IMPLANT
GLOVE BIOGEL PI INDICATOR 8 (GLOVE) ×5
GLOVE INDICATOR 8.0 STRL GRN (GLOVE) ×2 IMPLANT
GOWN STRL REUS W/ TWL LRG LVL3 (GOWN DISPOSABLE) ×2 IMPLANT
GOWN STRL REUS W/ TWL XL LVL3 (GOWN DISPOSABLE) ×1 IMPLANT
GOWN STRL REUS W/TWL LRG LVL3 (GOWN DISPOSABLE) ×2
GOWN STRL REUS W/TWL XL LVL3 (GOWN DISPOSABLE) ×1
HANDPIECE SUCTION TUBG SURGILV (MISCELLANEOUS) ×2 IMPLANT
HOOD PEEL AWAY FACE SHEILD DIS (HOOD) ×8 IMPLANT
KIT RM TURNOVER STRD PROC AR (KITS) ×2 IMPLANT
KIT STABILIZATION SHOULDER (MISCELLANEOUS) ×2 IMPLANT
MASK FACE SPIDER DISP (MASK) ×2 IMPLANT
NDL MAYO CATGUT SZ1 (NEEDLE) ×2
NDL MAYO CATGUT SZ5 (NEEDLE) ×1
NDL SAFETY 22GX1.5 (NEEDLE) ×2 IMPLANT
NDL SAFETY 25GX1.5 (NEEDLE) ×2 IMPLANT
NDL SUT 5 .5 CRC TPR PNT MAYO (NEEDLE) ×1 IMPLANT
NEEDLE 18GX1X1/2 (RX/OR ONLY) (NEEDLE) ×2 IMPLANT
NEEDLE MAYO CATGUT SZ1 (NEEDLE) ×1 IMPLANT
NEEDLE MAYO CATGUT SZ4 (NEEDLE) ×2 IMPLANT
NEEDLE SPNL 20GX3.5 QUINCKE YW (NEEDLE) ×2 IMPLANT
NS IRRIG 1000ML POUR BTL (IV SOLUTION) ×2 IMPLANT
PACK ARTHROSCOPY SHOULDER (MISCELLANEOUS) ×2 IMPLANT
PIN THREADED REVERSE (PIN) ×2 IMPLANT
SLING ULTRA II M (MISCELLANEOUS) ×2 IMPLANT
SOL .9 NS 3000ML IRR  AL (IV SOLUTION) ×1
SOL .9 NS 3000ML IRR UROMATIC (IV SOLUTION) ×1 IMPLANT
SPONGE LAP 18X18 5 PK (GAUZE/BANDAGES/DRESSINGS) ×2 IMPLANT
STAPLER SKIN PROX 35W (STAPLE) ×2 IMPLANT
SUT ETHIBOND #5 BRAIDED 30INL (SUTURE) ×6 IMPLANT
SUT ETHIBOND 0 MO6 C/R (SUTURE) ×2 IMPLANT
SUT ETHIBOND NAB CT1 #1 30IN (SUTURE) ×2 IMPLANT
SUT FIBERWIRE #2 38 BLUE 1/2 (SUTURE) ×4
SUT STEEL 5 (SUTURE) IMPLANT
SUT VIC AB 0 CT1 36 (SUTURE) ×4 IMPLANT
SUT VIC AB 2-0 CT1 27 (SUTURE) ×3
SUT VIC AB 2-0 CT1 TAPERPNT 27 (SUTURE) ×3 IMPLANT
SUTURE FIBERWR #2 38 BLUE 1/2 (SUTURE) ×2 IMPLANT
SYR 30ML LL (SYRINGE) ×4 IMPLANT
SYR TB 1ML LUER SLIP (SYRINGE) ×2 IMPLANT
SYRINGE 10CC LL (SYRINGE) ×2 IMPLANT
TUBE CONNECTING 6X3/16 (MISCELLANEOUS) ×2 IMPLANT
WATER STERILE IRR 1000ML POUR (IV SOLUTION) ×2 IMPLANT
WRAP SHOULDER HOT/COLD PACK (SOFTGOODS) ×2 IMPLANT
central screw ×2 IMPLANT

## 2014-10-28 NOTE — Progress Notes (Signed)
Spoke with DR. Poggi  Regarding pain issues. 0.5-1mg  of dilaudid Q2h prn for severe pain. Tramadol 50-100mg  Q6hours prn mild pain.

## 2014-10-28 NOTE — Plan of Care (Signed)
Problem: Consults Goal: Diagnosis- Total Joint Replacement Outcome: Completed/Met Date Met:  10/28/14 Hemiarthroplasty

## 2014-10-28 NOTE — Anesthesia Postprocedure Evaluation (Signed)
  Anesthesia Post-op Note  Patient: Jacqueline Russo  Procedure(s) Performed: Procedure(s): REVERSE SHOULDER ARTHROPLASTY (Right)  Anesthesia type:General, Regional  Patient location: PACU  Post pain: Pain level controlled  Post assessment: Post-op Vital signs reviewed, Patient's Cardiovascular Status Stable, Respiratory Function Stable, Patent Airway and No signs of Nausea or vomiting  Post vital signs: Reviewed and stable  Last Vitals:  Filed Vitals:   10/28/14 1537  BP: 153/50  Pulse: 81  Temp: 36.6 C  Resp: 18    Level of consciousness: awake, alert  and patient cooperative  Complications: No apparent anesthesia complications

## 2014-10-28 NOTE — H&P (Signed)
Paper H&P to be scanned into permanent record. H&P reviewed. No changes. 

## 2014-10-28 NOTE — Anesthesia Preprocedure Evaluation (Addendum)
Anesthesia Evaluation  Patient identified by MRN, date of birth, ID band Patient awake    Reviewed: Allergy & Precautions, H&P , NPO status , Patient's Chart, lab work & pertinent test results, reviewed documented beta blocker date and time   History of Anesthesia Complications Negative for: history of anesthetic complications  Airway Mallampati: I  TM Distance: >3 FB Neck ROM: full    Dental no notable dental hx. (+) Poor Dentition Bridge on the top right:   Pulmonary neg sleep apnea, neg COPDneg recent URI, former smoker,  breath sounds clear to auscultation  Pulmonary exam normal       Cardiovascular Exercise Tolerance: Good hypertension, On Medications - angina- CAD, - Past MI and - CABG Normal cardiovascular exam- Valvular Problems/MurmursRhythm:regular Rate:Normal     Neuro/Psych Seizures - (Once in 1963),  negative psych ROS   GI/Hepatic negative GI ROS, Neg liver ROS,   Endo/Other  negative endocrine ROS  Renal/GU negative Renal ROS  negative genitourinary   Musculoskeletal   Abdominal   Peds  Hematology  (+) Blood dyscrasia, anemia , CLL and MDS   Anesthesia Other Findings Past Medical History:   Hypertension                                                 Arthritis                                                    Ovarian cancer                                  1963         Leukemia, chronic lymphoid                                     Comment:CLL   Anemia                                                         Comment:Myelodysplastic syndrome   Reproductive/Obstetrics negative OB ROS                            Anesthesia Physical Anesthesia Plan  ASA: II  Anesthesia Plan: General and Regional   Post-op Pain Management:    Induction:   Airway Management Planned:   Additional Equipment:   Intra-op Plan:   Post-operative Plan:   Informed Consent: I have reviewed  the patients History and Physical, chart, labs and discussed the procedure including the risks, benefits and alternatives for the proposed anesthesia with the patient or authorized representative who has indicated his/her understanding and acceptance.   Dental Advisory Given  Plan Discussed with: Anesthesiologist, CRNA and Surgeon  Anesthesia Plan Comments:        Anesthesia Quick Evaluation

## 2014-10-28 NOTE — Op Note (Signed)
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  Post-Op Diagnosis:   Same.  Procedure:   Reverse right total shoulder arthroplasty.  Surgeon:   Pascal Lux, MD  Assistant:   Cameron Proud, PA-C  Anesthesia:   General endotracheal with an interscalene block placed preoperatively by the anesthesiologist.  Findings:   As above.  Complications:   None  EBL:  50 cc  Fluids:   1100 cc crystalloid  UOP:  350 cc  TT:   None  Drains:   None  Closure:   Staples  Implants:   All press-fit Bi[employer] Comprehensive system with a #8 mini-humeral stem, a 44 mm humeral tray with a standard insert, and a mini-base plate with a 36 mm glenosphere.  Brief Clinical Note:   The patient is an 81 year old female with a long history of progressively worsening right shoulder pain and limited motion. Her symptoms have progressed despite medications, activity modification, injections, etc. Her history and examination were consistent with advanced degenerative joint disease of the right shoulder which was confirmed by plain radiographs. An MRI scan demonstrated significant tendinopathy with tearing of the supraspinatus and possibly the infraspinatus tendons. She presents at this time for a reverse right total shoulder arthroplasty.  Procedure:   An interscalene block was placed preoperatively by the anesthesiologist in the PACU. The patient was brought into the operating room and lain in the supine position on the OR table. The patient then underwent general endotracheal intubation and anesthesia before being repositioned in the beach chair position using the beach chair positioner. The right shoulder and upper extremity were prepped with ChloraPrep solution before being draped sterilely. Preoperative antibiotics were administered. A standard anterior approach to the shoulder was made through an approximately 4 inch  incision. The incision was carried down through the subcutaneous tissues to expose the deltopectoral fascia. The interval between the deltoid and pectoralis muscles was identified and this plane developed, retracting the cephalic vein laterally with the deltoid muscle. The conjoined tendon was identified. Its lateral margin was dissected and the Kolbel self-retraining retractor inserted. The "three sisters" were identified and cauterized. Bursal tissues were removed to improve visualization. The subscapularis tendon was released from its attachment to the lesser tuberosity 1 cm proximal to its insertion and several tagging sutures placed. The inferior capsule was released with care after identifying and protecting the axillary nerve. The proximal humeral cut was made at approximately 30 of retroversion using the extra-medullary guide.   Attention was redirected to the glenoid. The labrum was debrided circumferentially before the center of the glenoid was marked with electrocautery. The guidewire was drilled into the glenoid neck using the appropriate guide. After verifying its position, it was overreamed with the mini-baseplate reamer to create a flat surface. The permanent mini-baseplate was impacted into place. It was stabilized with a 30 mm central screw and four peripheral screws. Locking screws were placed superiorly and inferiorly while nonlocking screws were placed anteriorly and posteriorly. The permanent 36 mm glenosphere was then impacted into place and its Morse taper locking mechanism verified using manual distraction.  Attention was directed to the humeral side. The humeral canal was reamed sequentially beginning with the end-cutting reamer then progressing from a 4 mm reamer up to an 8 mm reamer. This provided excellent circumferential chatter. The canal was broached beginning with a #5 broach and progressing to a #8 broach. This was left in  place and a trial reduction performed using the standard  trial humeral platform. The arm demonstrated excellent range of motion as the hand could be brought across the chest to the opposite shoulder and brought to the top of the patient's head and to the patient's ear. The shoulder appeared stable throughout this range of motion. The joint was dislocated and the trial components removed. The permanent #8 mini-stem was impacted into place with care taken to maintain the appropriate version. The permanent 44 mm humeral platform with the standard insert was put together on the back table and impacted into place. Again, the [facility] taper locking mechanism was verified using manual distraction. The shoulder was relocated using two finger pressure and again placed through a range of motion with the findings as described above.  The wound was copiously irrigated with bacitracin saline solution using the jet lavage system before a total of 20 cc of Exparel diluted out to 60 cc with normal saline and 30 cc of 0.5% Sensorcaine with epinephrine was injected into the pericapsular and peri-incisional tissues to help with postoperative analgesia. The subscapularis tendon was reapproximated using #2 FiberWire interrupted sutures. The deltopectoral interval was closed using 2-0 Vicryl interrupted sutures before the subcutaneous tissues also were closed using 2-0 Vicryl interrupted sutures. The skin was closed using staples. Prior to closing the skin, 1 g of transexemic acid in 10 cc of normal saline was injected intra-articularly to help with postoperative bleeding. A sterile occlusive dressing was applied to the wound before the arm was placed into a shoulder immobilizer with an abduction pillow. A polar pack system also was applied to the shoulder. The patient was then transferred back to a hospital bed before being awakened, extubated, and returned to the recovery room in satisfactory condition after tolerating the procedure well.

## 2014-10-28 NOTE — Anesthesia Procedure Notes (Addendum)
Procedure Name: Intubation Performed by: Demetrius Charity Pre-anesthesia Checklist: Patient identified, Emergency Drugs available, Suction available, Patient being monitored and Timeout performed Patient Re-evaluated:Patient Re-evaluated prior to inductionOxygen Delivery Method: Circle system utilized Preoxygenation: Pre-oxygenation with 100% oxygen Intubation Type: IV induction Ventilation: Mask ventilation without difficulty Laryngoscope Size: Mac and 3 Grade View: Grade I Tube type: Oral Tube size: 7.0 mm Number of attempts: 1 Airway Equipment and Method: Stylet Placement Confirmation: ETT inserted through vocal cords under direct vision,  positive ETCO2,  CO2 detector and breath sounds checked- equal and bilateral Secured at: 21 cm Tube secured with: Tape Dental Injury: Teeth and Oropharynx as per pre-operative assessment    Anesthesia Regional Block:  Interscalene brachial plexus block  Pre-Anesthetic Checklist: ,, timeout performed, Correct Patient, Correct Site, Correct Laterality, Correct Procedure, Correct Position, site marked, Risks and benefits discussed,  Surgical consent,  Pre-op evaluation,  At surgeon's request and post-op pain management  Laterality: Right  Prep: chloraprep       Needles:  Injection technique: Single-shot  Needle Type: Echogenic Stimulator Needle     Needle Length: 13cm 13 cm Needle Gauge: 21 and 21 G    Additional Needles:  Procedures: Doppler guided and ultrasound guided (picture in chart) Interscalene brachial plexus block Narrative:  Start time: 10/28/2014 11:20 AM End time: 10/28/2014 11:28 AM Injection made incrementally with aspirations every 5 mL.  Performed by: Personally  Anesthesiologist: Martha Clan

## 2014-10-28 NOTE — Transfer of Care (Signed)
Immediate Anesthesia Transfer of Care Note  Patient: Jacqueline Russo  Procedure(s) Performed: Procedure(s): REVERSE SHOULDER ARTHROPLASTY (Right)  Patient Location: PACU  Anesthesia Type:General  Level of Consciousness: awake, alert  and oriented  Airway & Oxygen Therapy: Patient Spontanous Breathing and Patient connected to face mask oxygen  Post-op Assessment: Report given to RN and Post -op Vital signs reviewed and stable  Post vital signs: Reviewed and stable  Last Vitals:  Filed Vitals:   10/28/14 1430  BP: 178/74  Pulse: 72  Temp: 36.6 C  Resp: 16    Complications: No apparent anesthesia complications

## 2014-10-29 ENCOUNTER — Encounter: Payer: Self-pay | Admitting: Surgery

## 2014-10-29 LAB — CBC WITH DIFFERENTIAL/PLATELET
Basophils Absolute: 0.1 10*3/uL (ref 0–0.1)
Basophils Relative: 1 %
Eosinophils Absolute: 0 10*3/uL (ref 0–0.7)
Eosinophils Relative: 0 %
HCT: 26.7 % — ABNORMAL LOW (ref 35.0–47.0)
Hemoglobin: 9.3 g/dL — ABNORMAL LOW (ref 12.0–16.0)
Lymphocytes Relative: 4 %
Lymphs Abs: 0.4 10*3/uL — ABNORMAL LOW (ref 1.0–3.6)
MCH: 37.9 pg — ABNORMAL HIGH (ref 26.0–34.0)
MCHC: 34.9 g/dL (ref 32.0–36.0)
MCV: 108.5 fL — ABNORMAL HIGH (ref 80.0–100.0)
Monocytes Absolute: 1 10*3/uL — ABNORMAL HIGH (ref 0.2–0.9)
Monocytes Relative: 10 %
Neutro Abs: 8.1 10*3/uL — ABNORMAL HIGH (ref 1.4–6.5)
Neutrophils Relative %: 85 %
Platelets: 242 10*3/uL (ref 150–440)
RBC: 2.46 MIL/uL — ABNORMAL LOW (ref 3.80–5.20)
RDW: 15.1 % — ABNORMAL HIGH (ref 11.5–14.5)
WBC: 9.6 10*3/uL (ref 3.6–11.0)

## 2014-10-29 LAB — BASIC METABOLIC PANEL
Anion gap: 13 (ref 5–15)
BUN: 10 mg/dL (ref 6–20)
CO2: 25 mmol/L (ref 22–32)
Calcium: 8.7 mg/dL — ABNORMAL LOW (ref 8.9–10.3)
Chloride: 93 mmol/L — ABNORMAL LOW (ref 101–111)
Creatinine, Ser: 0.81 mg/dL (ref 0.44–1.00)
GFR calc Af Amer: >60 mL/min (ref >60)
GFR calc non Af Amer: >60 mL/min (ref >60)
Glucose, Bld: 208 mg/dL — ABNORMAL HIGH (ref 65–99)
Potassium: 3 mmol/L — ABNORMAL LOW (ref 3.5–5.1)
Sodium: 131 mmol/L — ABNORMAL LOW (ref 135–145)

## 2014-10-29 MED ORDER — POTASSIUM CHLORIDE 20 MEQ PO PACK
40.0000 meq | PACK | Freq: Two times a day (BID) | ORAL | Status: AC
Start: 2014-10-29 — End: 2014-10-29
  Administered 2014-10-29 (×2): 40 meq via ORAL
  Filled 2014-10-29 (×2): qty 2

## 2014-10-29 NOTE — Care Management Note (Addendum)
Case Management Note  Patient Details  Name: Jacqueline Russo MRN: [id number] Date of Birth: 1929-10-26  Subjective/Objective:                  Met with patient to discuss discharge planning. Patient is from [place]. She lives alone but is within proximity to [address] and states she can hire someone to help her in the home. She wants to return home. She states she is independent with mobiity. She states she plans to return home tomorrow.    Action/Plan:  List of home health agencies provided to patient; she is undecided. RNCM will continue to follow.   Expected Discharge Date:  10/30/14               Expected Discharge Plan:     In-House Referral:     Discharge planning Services  CM Consult  Post Acute Care Choice:  Home Health Choice offered to:  Patient  DME Arranged:    DME Agency:     HH Arranged:    [place] Agency:     Status of Service:     Medicare Important Message Given:    Date Medicare IM Given:    Medicare IM give by:    Date Additional Medicare IM Given:    Additional Medicare Important Message give by:     If discussed at [place] of Stay Meetings, dates discussed:    Additional Comments:  Marshell Garfinkel, RN 10/29/2014, 1:46 PM

## 2014-10-29 NOTE — Progress Notes (Signed)
Physical Therapy Treatment Patient Details Name: Jacqueline Russo MRN: [id number] DOB: March 31, 1929 Today's Date: 10/29/2014    History of Present Illness Pt is an 81 y.o. 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    PT Comments    Pt able to progress to ambulating 200 feet no AD with CGA.  Pt requires increased time and effort to get in/out of bed but is able to perform this on her own.  Pt requires assist for sling/immobilizer management and reports that she will have a friend assist her.  Recommend HHPT for home safety and ADL's as needed (pt R handed and is now [place] R UE in sling/immobilizer).  Follow Up Recommendations  Home health PT     Equipment Recommendations  None recommended by PT    Recommendations for Other Services       Precautions / Restrictions Precautions Precautions: Fall;Shoulder Shoulder Interventions:  (R shoulder abduction sling) Precaution Comments: No lifting with R UE Restrictions Weight Bearing Restrictions: Yes RUE Weight Bearing: Non weight bearing    Mobility  Bed Mobility Overal bed mobility: Needs Assistance Bed Mobility: Supine to Sit;Sit to Supine     Supine to sit: Supervision Sit to supine: Supervision   General bed mobility comments: increased time and effort to perform on own; bed flat  Transfers Overall transfer level: Needs assistance Equipment used: None Transfers: Sit to/from Stand;Stand Pivot Transfers Sit to Stand: Min guard Stand pivot transfers: Min guard (to toilet)       General transfer comment: steady without loss of balance  Ambulation/Gait Ambulation/Gait assistance: Min guard Ambulation Distance (Feet): 200 Feet Assistive device: None Gait Pattern/deviations: WFL(Within Functional Limits) Gait velocity: decreased cadence with distance   General Gait Details: no loss of balance noted   Stairs            Wheelchair Mobility     Modified Rankin (Stroke Patients Only)       Balance Overall balance assessment: Needs assistance Sitting-balance support: No upper extremity supported;Feet supported Sitting balance-Leahy Scale: Good     Standing balance support: No upper extremity supported Standing balance-Leahy Scale: Good                      Cognition Arousal/Alertness: Awake/alert Behavior During Therapy: WFL for tasks assessed/performed Overall Cognitive Status: Within Functional Limits for tasks assessed                      Exercises      General Comments  Pt educated on how to donn/doff R sling/immobilizer for self care and how to properly adjust for proper positioning and comfort; pt requires assist but reports she will have a friend help her as needed.      Pertinent Vitals/Pain Pain Assessment: 0-10 Pain Score: 6  Pain Location: R shoulder Pain Descriptors / Indicators: Aching;Sore;Tender Pain Intervention(s): Limited activity within patient's tolerance;Monitored during session;Repositioned  O2 >90% on room air during session.  HR WFL during session.    Home Living                      Prior Function            PT Goals (current goals can now be found in the care plan section) Acute Rehab PT Goals Patient Stated Goal: To go home PT Goal Formulation: With patient Time For Goal Achievement: 11/12/14 Potential to Achieve Goals: Good Progress  towards PT goals: Progressing toward goals    Frequency  BID    PT Plan Current plan remains appropriate    Co-evaluation             End of Session Equipment Utilized During Treatment: Gait belt Activity Tolerance: Patient tolerated treatment well Patient left: in bed;with call bell/phone within reach;with bed alarm set     Time: 2446-2863 PT Time Calculation (min) (ACUTE ONLY): 38 min  Charges:  $Gait Training: 8-22 mins $Therapeutic Activity: 8-22 mins $Self Care/Home Management: 8-22                     G CodesLeitha Bleak Nov 16, 2014, 3:38 PM Leitha Bleak, [place]

## 2014-10-29 NOTE — Progress Notes (Signed)
Pt. Alert and oriented. VSS. Pain controlled with meds per MAR. Up to chair with PT, Ambulated around nurses station. Tolerating meals. Pills whole with water. Voiding in bedpan. Resting quietly.

## 2014-10-29 NOTE — Discharge Instructions (Signed)
Shoulder Joint Replacement, Care After °Refer to this sheet in the next few weeks. These instructions provide you with information on caring for yourself after your procedure. Your health care provider may also give you more specific instructions. Your treatment has been planned according to current medical practices, but problems sometimes occur. Call your health care provider if you have any problems or questions after your procedure. °WHAT TO EXPECT AFTER THE PROCEDURE °After your procedure, your arm and shoulder will typically be stiff and bruised. This will improve over time. °HOME CARE INSTRUCTIONS  °· You may resume your normal diet and activities as directed by your surgeon. °· You should regain full use of your shoulder in 6 weeks. °· Your arm will be in a sling. You will need to wear this for 4-6 weeks after surgery. °· Wear the sling every night for at least the first month, or as instructed by your surgeon. °· Do not use your arm to push yourself up in bed or from a chair. This requires too much muscle. °· Follow the program of home exercises suggested. Do the exercises 4-5 times a day for a month or as directed. °· Try not to overuse your shoulder. Overusing the shoulder is easy to do if this is the first time you have been pain free in a long time. Early overuse of the shoulder may result in later problems. °· Do not lift anything heavier than a cup of coffee for the first 6 weeks after surgery. °· Ask for help. Your health care provider may be able to suggest a clinic or agency for this if you do not have home support. °· Do not participate in contact sports or do any heavy lifting (more than 10 lb [4.5 kg]) for at least 6 months, or as directed. °· Apply ice to the injured area for the first 2 days after surgery: °¨ Put ice in a plastic bag. °¨ Place a towel between your skin and the bag. °¨ Leave the ice on for 20 minutes, 2-3 times a day. °· Change dressings if necessary or as directed. °· Only  take over-the-counter or prescription medicines for pain, discomfort, or fever as directed by your health care provider. °· Keep all follow-up appointments as directed. °SEEK MEDICAL CARE IF: °· You have redness, swelling, or increasing pain in the wound. °· You see pus coming from the wound. °· You have a fever. °· You notice a bad smell coming from the wound or dressing. °· The edges of the wound break open after sutures or staples have been removed. °· You have increasing pain with movement of the shoulder. °SEEK IMMEDIATE MEDICAL CARE IF:  °· You develop a rash. °· You have chest pain or shortness of breath. °· You have any reaction or side effects to medicine given. °MAKE SURE YOU: °· Understand these instructions. °· Will watch your condition. °· Will get help right away if you are not doing well or get worse. °Document Released: 09/24/2004 Document Revised: 03/12/2013 Document Reviewed: 10/04/2012 °ExitCare® Patient Information ©2015 [employer]. This information is not intended to replace advice given to you by your health care provider. Make sure you discuss any questions you have with your health care provider. ° °

## 2014-10-29 NOTE — Clinical Social Work Note (Signed)
PT has assessed patient and has recommended that patient can return home with home health. RN CM has spoken to patient and this is what she wishes to proceed with. Please reconsult CSW should the need arise. Shela Leff MSW,LCSW [phone]

## 2014-10-29 NOTE — Progress Notes (Signed)
  Subjective: 1 Day Post-Op Procedure(s) (LRB): REVERSE SHOULDER ARTHROPLASTY (Right) Patient reports pain as 8 on 0-10 scale.   Patient is well, and has had no acute complaints or problems Plan is to go Home after hospital stay. Negative for chest pain and shortness of breath Fever: no Gastrointestinal:Negative for nausea and vomiting  Objective: Vital signs in last 24 hours: Temp:  [97.6 F (36.4 C)-99.4 F (37.4 C)] 98.8 F (37.1 C) (08/10 0427) Pulse Rate:  [38-107] 87 (08/10 0427) Resp:  [14-24] 18 (08/10 0427) BP: (145-198)/(42-116) 171/60 mmHg (08/10 0427) SpO2:  [89 %-100 %] 93 % (08/10 0441) Weight:  [51.71 kg (114 lb)] 51.71 kg (114 lb) (08/09 1006)  Intake/Output from previous day:  Intake/Output Summary (Last 24 hours) at 10/29/14 0801 Last data filed at 10/29/14 0520  Gross per 24 hour  Intake 2193.75 ml  Output   2501 ml  Net -307.25 ml    Intake/Output this shift:    Labs:  Recent Labs  10/29/14 0601  HGB 9.3*    Recent Labs  10/29/14 0601  WBC 9.6  RBC 2.46*  HCT 26.7*  PLT 242    Recent Labs  10/29/14 0601  NA 131*  K 3.0*  CL 93*  CO2 25  BUN 10  CREATININE 0.81  GLUCOSE 208*  CALCIUM 8.7*   No results for input(s): LABPT, INR in the last 72 hours.   EXAM General - Patient is Alert and Appropriate, she is slightly confused this morning. Extremity - Neurologically intact ABD soft Dorsiflexion/Plantar flexion intact Incision: dressing C/D/I No cellulitis present Dressing/Incision - clean, dry, no drainage Motor Function - intact, moving foot and toes well on exam.   Abdomen is soft on exam.  She denies any abdominal pain.  No distention or tympany.  Past Medical History  Diagnosis Date  . Hypertension   . Arthritis   . Ovarian cancer 1963  . Leukemia, chronic lymphoid     CLL  . Anemia     Myelodysplastic syndrome    Assessment/Plan: 1 Day Post-Op Procedure(s) (LRB): REVERSE SHOULDER ARTHROPLASTY  (Right) Active Problems:   Status post reverse total shoulder replacement  Estimated body mass index is 22.26 kg/(m^2) as calculated from the following:   Height as of this encounter: 5' (1.524 m).   Weight as of this encounter: 51.71 kg (114 lb). Advance diet Up with therapy   PT to get patient up today. Pt has not had a BM Pt vomited this AM.  Denies any N/V or abdominal pain presently. K+ 3.0.  Klor-Con 40 mEq BID today, will re-check tomorrow. Plan on discharge tomorrow.  DVT Prophylaxis - Lovenox, Foot Pumps and TED hose No lifting with the right upper extremity.   Raquel James, PA-C [facility] Orthopaedic Surgery 10/29/2014, 8:01 AM

## 2014-10-29 NOTE — Discharge Summary (Signed)
Physician Discharge Summary  Patient ID: Jacqueline Russo MRN: [id number] DOB/AGE: 25-Jun-1929 81 y.o.  Admit date: 10/28/2014 Discharge date: 10/29/2014  Admission Diagnoses:  PRIMARY OSTEOARTHRITIS OF RIGHT SHOULDER   Discharge Diagnoses: Patient Active Problem List   Diagnosis Date Noted  . Status post reverse total shoulder replacement 10/28/2014    Past Medical History  Diagnosis Date  . Hypertension   . Arthritis   . Ovarian cancer 1963  . Leukemia, chronic lymphoid     CLL  . Anemia     Myelodysplastic syndrome     Transfusion: None   Consultants (if any):    Discharged Condition: Improved  Hospital Course: Jacqueline Russo is an 81 y.o. female who was admitted 10/28/2014 with a diagnosis of degenerative joint disease with cuff tear arthropathy of the right shoulder and went to the operating room on 10/28/2014 and underwent the above named procedures.    Surgeries: Procedure(s): REVERSE SHOULDER ARTHROPLASTY on 10/28/2014 Patient tolerated the surgery well. Taken to PACU where she was stabilized and then transferred to the orthopedic floor.  Started on Lovenox 40mg  q 24 hrs. Foot pumps applied bilaterally at 80 mm. Heels elevated on bed with rolled towels. No evidence of DVT. Negative [place]. Physical therapy started on day #1 for gait training and transfer. OT started day #1 for ADL and assisted devices.  Patient's IV and Foley were d/c on POD1.  Implants: All press-fit [employer] Comprehensive system with a #8 mini-humeral stem, a 44 mm humeral tray with a standard insert, and a mini-base plate with a 36 mm glenosphere.  She was given perioperative antibiotics:  Anti-infectives    Start     Dose/Rate Route Frequency Ordered Stop   10/28/14 1800  ceFAZolin (ANCEF) IVPB 2 g/50 mL premix     2 g 100 mL/hr over 30 Minutes Intravenous Every 6 hours 10/28/14 1546 10/29/14 0507   10/28/14 1015  ceFAZolin (ANCEF) IVPB 2 g/50 mL premix     2 g 100 mL/hr over 30 Minutes Intravenous   Once 10/28/14 1001 10/28/14 1156   10/28/14 0915  ceFAZolin (ANCEF) 2-3 GM-% IVPB SOLR    Comments:  Slemenda, Debbie: cabinet override      10/28/14 0915 10/28/14 2114    .  She was given sequential compression devices, early ambulation, and lovenox for DVT prophylaxis.  She benefited maximally from the hospital stay and there were no complications.    Recent vital signs:  Filed Vitals:   10/29/14 1702  BP: 180/58  Pulse: 92  Temp: 98.9 F (37.2 C)  Resp: 18    Recent laboratory studies:  Lab Results  Component Value Date   HGB 9.3* 10/29/2014   HGB 9.7* 10/15/2014   HGB 11.6* 01/06/2014   Lab Results  Component Value Date   WBC 9.6 10/29/2014   PLT 242 10/29/2014   Lab Results  Component Value Date   INR 1.02 10/15/2014   Lab Results  Component Value Date   NA 131* 10/29/2014   K 3.0* 10/29/2014   CL 93* 10/29/2014   CO2 25 10/29/2014   BUN 10 10/29/2014   CREATININE 0.81 10/29/2014   GLUCOSE 208* 10/29/2014    Discharge Medications:     Medication List    ASK your doctor about these medications        amLODipine 5 MG tablet  Commonly known as:  NORVASC  Take 5 mg by mouth at bedtime.     diphenhydrAMINE 50 MG tablet  Commonly known as:  BENADRYL  Take 50 mg by mouth at bedtime as needed for itching.     hydrochlorothiazide 25 MG tablet  Commonly known as:  HYDRODIURIL  Take 25 mg by mouth daily.     HYDROcodone-acetaminophen 5-325 MG per tablet  Commonly known as:  NORCO/VICODIN  Take 1 tablet by mouth 3 (three) times daily as needed for moderate pain.     lisinopril 40 MG tablet  Commonly known as:  PRINIVIL,ZESTRIL  Take 40 mg by mouth every morning.     traMADol-acetaminophen 37.5-325 MG per tablet  Commonly known as:  ULTRACET  Take 1 tablet by mouth every 8 (eight) hours as needed.        Diagnostic Studies: Dg Shoulder Right Port  2014/11/09   CLINICAL DATA:  Status post total shoulder replacement.  EXAM: PORTABLE RIGHT  SHOULDER - 2+ VIEW  COMPARISON:  MRI of Jul 31, 2014.  FINDINGS: The prosthesis appears to be well situated. No fracture or dislocation is noted.  IMPRESSION: Status post right shoulder arthroplasty.   Electronically Signed   By: Marijo Conception, M.D.   On: November 09, 2014 15:15    Disposition: Stable and ready for discharge.  Pain is well controlled on oral medication.  She has urinated using the bedpan and is tolerating meals.  According to PT she is able to ambulated 200 feet and have recommended Home Health PT.  She would like to go to rehab due to the fact that she lives alone.       Follow-up Information    Follow up with Judson Roch, PA-C In 10 days.   Specialty:  Physician Assistant   Why:  For wound re-check, For staple removal   Contact information:   [place] [address] [phone]      Signed: Judson Roch PA-C 10/29/2014, 7:12 PM

## 2014-10-29 NOTE — Evaluation (Signed)
Physical Therapy Evaluation Patient Details Name: Jacqueline Russo MRN: [id number] DOB: Nov 19, 1929 Today's Date: 10/29/2014   History of Present Illness  Pt is an 81 y.o. 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  Clinical Impression  Currently pt demonstrates impairments with post-op R shoulder pain and limitations with functional mobility d/t NWB R UE in sling/immobilizer.  Prior to admission, pt was independent and very active.  Pt lives at [facility] [place] apartments.  Currently pt is min assist with bed mobility and CGA with transfers and ambulating short distance bed to recliner with no AD.  Pt appears fairly steady with ambulation but limited d/t c/o nausea once up and also R shoulder pain.  Pt would benefit from skilled PT to address above noted impairments and functional limitations.  Recommend pt discharge to home with HHPT when medically appropriate.     Follow Up Recommendations Home health PT    Equipment Recommendations  None recommended by PT    Recommendations for Other Services       Precautions / Restrictions Precautions Precautions: Fall;Shoulder Shoulder Interventions:  (Shoulder abduction sling) Precaution Comments: No lifting with R UE Restrictions Weight Bearing Restrictions: Yes RUE Weight Bearing: Non weight bearing      Mobility  Bed Mobility Overal bed mobility: Needs Assistance Bed Mobility: Supine to Sit     Supine to sit: Min assist;HOB elevated     General bed mobility comments: assist for trunk balance upon sitting up; increased time to perform  Transfers Overall transfer level: Needs assistance Equipment used: None Transfers: Sit to/from Stand Sit to Stand: Min guard         General transfer comment: steady without loss of balance  Ambulation/Gait Ambulation/Gait assistance: Min guard Ambulation Distance (Feet): 4 Feet (bed to recliner) Assistive  device: None Gait Pattern/deviations: Step-through pattern Gait velocity: decreased      Stairs            Wheelchair Mobility    Modified Rankin (Stroke Patients Only)       Balance Overall balance assessment: Needs assistance Sitting-balance support: No upper extremity supported Sitting balance-Leahy Scale: Fair   Postural control: Posterior lean Standing balance support: No upper extremity supported Standing balance-Leahy Scale: Good                               Pertinent Vitals/Pain Pain Assessment: 0-10 Pain Score: 8  Pain Location: R shoulder Pain Descriptors / Indicators: Sharp;Shooting Pain Intervention(s): Limited activity within patient's tolerance;Monitored during session;Premedicated before session;Repositioned;Ice applied  Vitals stable and WFL throughout treatment session (pt >91% on room air with activity).    Home Living Family/patient expects to be discharged to:: Assisted living Living Arrangements: Alone Available Help at Discharge: Friend(s) Type of Home: Independent living facility [facility] apartments ) Home Access: Media planner (prior to surgery, walked 4 flights of stairs 2x daily (instead of using elevator to apt))     Home Layout: One level Home Equipment: [place] - single point      Prior Function Level of Independence: Independent         Comments: Pt denies any falls in past 6 months     Hand Dominance        Extremity/Trunk Assessment   Upper Extremity Assessment: RUE deficits/detail;LUE deficits/detail   RUE: Unable to fully assess due to immobilization;Unable to fully assess due to pain  LUE Deficits / Details: WFL   Lower Extremity Assessment: Overall WFL for tasks assessed         Communication   Communication: No difficulties  Cognition Arousal/Alertness: Awake/alert Behavior During Therapy: WFL for tasks assessed/performed Overall Cognitive Status: Within Functional Limits for  tasks assessed                      General Comments   Nursing cleared pt for participation in physical therapy.  Pt agreeable to PT session.  Reviewed [place] R UE status with pt as well as precautions.    Exercises   Performed semi-supine B LE therapeutic exercise x 10 reps:  Ankle pumps (AROM B LE's); quad sets x3 second holds (AROM B LE's); glute squeezes x3 second holds (AROM B); SAQ's (AROM R; AROM L); heelslides (AROM R; AROM L), hip abd/adduction (AROM R; AROM L), and SLR (AROM R; AROM L).  Pt required vc's and tactile cues for correct technique with exercises.       Assessment/Plan    PT Assessment Patient needs continued PT services  PT Diagnosis Acute pain   PT Problem List Decreased activity tolerance;Decreased balance;Decreased mobility;Decreased knowledge of precautions;Pain  PT Treatment Interventions DME instruction;Gait training;Functional mobility training;Therapeutic activities;Therapeutic exercise;Balance training;Patient/family education   PT Goals (Current goals can be found in the Care Plan section) Acute Rehab PT Goals Patient Stated Goal: To go home PT Goal Formulation: With patient Time For Goal Achievement: 11/12/14 Potential to Achieve Goals: Good    Frequency BID   Barriers to discharge Decreased caregiver support (Pt will ask friends to assist as needed)      Co-evaluation               End of Session Equipment Utilized During Treatment: Gait belt Activity Tolerance: Patient limited by pain Patient left: in chair;with call bell/phone within reach;with chair alarm set;with SCD's reapplied (pillows under sling for increased UE support/comfort)           Time: 0762-2633 PT Time Calculation (min) (ACUTE ONLY): 30 min   Charges:   PT Evaluation $Initial PT Evaluation Tier I: 1 Procedure PT Treatments $Therapeutic Exercise: 8-22 mins   PT G CodesLeitha Bleak 10/30/14, 11:40 AM Leitha Bleak,  [place]

## 2014-10-30 ENCOUNTER — Encounter
Admission: RE | Admit: 2014-10-30 | Discharge: 2014-10-30 | Disposition: A | Discharge status: Home / Self Care | Payer: Medicare Other | Source: Ambulatory Visit | Attending: Internal Medicine | Admitting: Internal Medicine

## 2014-10-30 LAB — BASIC METABOLIC PANEL
Anion gap: 10 (ref 5–15)
BUN: 12 mg/dL (ref 6–20)
CO2: 27 mmol/L (ref 22–32)
Calcium: 8.3 mg/dL — ABNORMAL LOW (ref 8.9–10.3)
Chloride: 93 mmol/L — ABNORMAL LOW (ref 101–111)
Creatinine, Ser: 0.83 mg/dL (ref 0.44–1.00)
GFR calc Af Amer: >60 mL/min (ref >60)
GFR calc non Af Amer: >60 mL/min (ref >60)
Glucose, Bld: 180 mg/dL — ABNORMAL HIGH (ref 65–99)
Potassium: 3.5 mmol/L (ref 3.5–5.1)
Sodium: 130 mmol/L — ABNORMAL LOW (ref 135–145)

## 2014-10-30 LAB — CBC WITH DIFFERENTIAL/PLATELET
Basophils Absolute: 0 10*3/uL (ref 0–0.1)
Basophils Relative: 0 %
Eosinophils Absolute: 0.1 10*3/uL (ref 0–0.7)
Eosinophils Relative: 1 %
HCT: 24.1 % — ABNORMAL LOW (ref 35.0–47.0)
Hemoglobin: 8.5 g/dL — ABNORMAL LOW (ref 12.0–16.0)
Lymphocytes Relative: 9 %
Lymphs Abs: 0.8 10*3/uL — ABNORMAL LOW (ref 1.0–3.6)
MCH: 38.7 pg — ABNORMAL HIGH (ref 26.0–34.0)
MCHC: 35.3 g/dL (ref 32.0–36.0)
MCV: 109.6 fL — ABNORMAL HIGH (ref 80.0–100.0)
Monocytes Absolute: 1.5 10*3/uL — ABNORMAL HIGH (ref 0.2–0.9)
Monocytes Relative: 17 %
Neutro Abs: 6.4 10*3/uL (ref 1.4–6.5)
Neutrophils Relative %: 73 %
Platelets: 201 10*3/uL (ref 150–440)
RBC: 2.2 MIL/uL — ABNORMAL LOW (ref 3.80–5.20)
RDW: 14.8 % — ABNORMAL HIGH (ref 11.5–14.5)
WBC: 8.8 10*3/uL (ref 3.6–11.0)

## 2014-10-30 LAB — SURGICAL PATHOLOGY

## 2014-10-30 MED ORDER — OXYCODONE HCL 5 MG PO TABS: 5.0000 mg | ORAL_TABLET | ORAL | Status: DC | PRN

## 2014-10-30 NOTE — Progress Notes (Signed)
  Subjective: 2 Days Post-Op Procedure(s) (LRB): REVERSE SHOULDER ARTHROPLASTY (Right) Patient reports pain as moderate.   Patient is well, and has had no acute complaints or problems Plan is to go Home after hospital stay. Negative for chest pain and shortness of breath Fever: no Gastrointestinal:Negative for nausea and vomiting  Objective: Vital signs in last 24 hours: Temp:  [98.8 F (37.1 C)-99.3 F (37.4 C)] 99 F (37.2 C) (08/11 9528) Pulse Rate:  [80-92] 85 (08/11 0652) Resp:  [18] 18 (08/11 0652) BP: (131-189)/(52-78) 151/54 mmHg (08/11 0652) SpO2:  [90 %-96 %] 90 % (08/11 0652)  Intake/Output from previous day:  Intake/Output Summary (Last 24 hours) at 10/30/14 0726 Last data filed at 10/30/14 0459  Gross per 24 hour  Intake   1100 ml  Output   1025 ml  Net     75 ml    Intake/Output this shift:    Labs:  Recent Labs  10/29/14 0601 10/30/14 0559  HGB 9.3* 8.5*    Recent Labs  10/29/14 0601 10/30/14 0559  WBC 9.6 8.8  RBC 2.46* 2.20*  HCT 26.7* 24.1*  PLT 242 201    Recent Labs  10/29/14 0601 10/30/14 0559  NA 131* 130*  K 3.0* 3.5  CL 93* 93*  CO2 25 27  BUN 10 12  CREATININE 0.81 0.83  GLUCOSE 208* 180*  CALCIUM 8.7* 8.3*   No results for input(s): LABPT, INR in the last 72 hours.   EXAM General - Patient is Alert, Appropriate and Oriented Extremity - Neurologically intact ABD soft Neurovascular intact Sensation intact distally Incision: dressing C/D/I No cellulitis present Dressing/Incision - clean, dry Motor Function - intact, moving foot and toes well on exam.   Abdomen is soft on exam.  No-distention or tympany. Pt in neurovascularly intact to the right upper extremity.  Past Medical History  Diagnosis Date  . Hypertension   . Arthritis   . Ovarian cancer 1963  . Leukemia, chronic lymphoid     CLL  . Anemia     Myelodysplastic syndrome    Assessment/Plan: 2 Days Post-Op Procedure(s) (LRB): REVERSE SHOULDER  ARTHROPLASTY (Right) Active Problems:   Status post reverse total shoulder replacement  Estimated body mass index is 22.26 kg/(m^2) as calculated from the following:   Height as of this encounter: 5' (1.524 m).   Weight as of this encounter: 51.71 kg (114 lb). Advance diet Up with therapy Discharge to SNF   K+ up to 3.5 today. Na is 130, drop from 131 yesterday. Pt states that she feels ready to be discharged. Pt has urinated while in the hospitalized and has gotten up with therapy. Pt would like to go to Rehab due to the fact that she lives home alone.  DVT Prophylaxis - Lovenox, Foot Pumps and TED hose No lifting with the right upper extremity.  Raquel Micha Erck, PA-C [facility] Orthopaedic Surgery 10/30/2014, 7:26 AM

## 2014-10-30 NOTE — Progress Notes (Signed)
Clinical Social Worker (CSW) met with patient to discuss D/C plan. CSW explained that patient will have to pay out of pocket for rehab at [facility]. Patient reported that she does not want to pay privately and wants to go home with home health. Per patient she is going to hire a nurse to help her at home. PT is recommending home health. RN Case Manager aware of above. RN aware of above. Please reconsult if future social work needs arise. CSW signing off.   Blima Rich, [place] [phone]

## 2014-10-30 NOTE — Progress Notes (Signed)
Physical Therapy Treatment Patient Details Name: Jacqueline Russo MRN: [id number] DOB: 1929-10-24 Today's Date: 10/30/2014    History of Present Illness Pt is an 81 y.o. 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    PT Comments    Pt able to progress to 360 feet of ambulation without AD independently.  Pt also went supine to sit via rolling onto L side modified independently.  No loss of balance noted during session.  Donning/doffing of R UE sling/immobilizer reviewed with pt: pt demonstrating difficulty managing sling/immobilizer on her own.  Pt initially reported she had friends who could assist her with this (for dressing and self care) but now feels like she can't manage at home.  Functionally pt demonstrates appropriate mobility for safe discharge home.  Anticipate pt may have increased difficulty managing ADL's d/t R UE NWB'ing and in sling/immobilizer and needing to use non-dominate hand instead.  Pt reports that she plans to hire someone to assist with these needs.  Care management notified of above.  Follow Up Recommendations  Home health PT     Equipment Recommendations  None recommended by PT    Recommendations for Other Services       Precautions / Restrictions Precautions Precautions: Fall;Shoulder Shoulder Interventions:  (R shoulder abduction sling) Precaution Comments: No lifting with R UE Restrictions Weight Bearing Restrictions: Yes RUE Weight Bearing: Non weight bearing    Mobility  Bed Mobility Overal bed mobility: Modified Independent Bed Mobility: Supine to Sit     Supine to sit: Modified independent (Device/Increase time) (pt rolled onto L side and pushed up with L UE (no increased time needed today))     General bed mobility comments: bed flat  Transfers Overall transfer level: Independent Equipment used: None Transfers: Sit to/from [employer] to Stand:  Independent Stand pivot transfers: Independent (transfer to recliner chair)       General transfer comment: steady without loss of balance  Ambulation/Gait Ambulation/Gait assistance: Independent Ambulation Distance (Feet): 360 Feet Assistive device: None Gait Pattern/deviations: WFL(Within Functional Limits) Gait velocity: normal gait cadence   General Gait Details: no loss of balance noted   Stairs            Wheelchair Mobility    Modified Rankin (Stroke Patients Only)       Balance Overall balance assessment: Independent Sitting-balance support: No upper extremity supported;Feet supported Sitting balance-Leahy Scale: Normal     Standing balance support: No upper extremity supported Standing balance-Leahy Scale: Normal                      Cognition Arousal/Alertness: Awake/alert Behavior During Therapy: WFL for tasks assessed/performed Overall Cognitive Status: Within Functional Limits for tasks assessed                      Exercises   Performed semi-supine B LE therapeutic exercise x 15 reps:  Ankle pumps (AROM B LE's); quad sets x3 second holds (AROM B LE's); glute squeezes x3 second holds (AROM B); hip aDduction isometrics (pillow between pt's knees) x3 second holds; SAQ's (AROM R; AROM L); heelslides (AROM R; AROM L), hip abd/adduction (AROM R; AROM L), and SLR (AROM R; AROM L).  Pt demonstrating appropriate technique with ex's. Reviewed precautions post-op (pt verbalizing and demonstrating excellent understanding) and also donning/doffing sling for self care (pt with difficulty performing on own and reporting she would need help with this).  General Comments General comments (skin integrity, edema, etc.): Dressing intact with dried drainage noted  Nursing cleared pt for participation in physical therapy.  Pt agreeable to PT session.      Pertinent Vitals/Pain Pain Assessment: 0-10 Pain Score: 5  Pain Location: R shoulder Pain  Descriptors / Indicators: Aching;Sore;Tender Pain Intervention(s): Limited activity within patient's tolerance;Monitored during session;Premedicated before session;Repositioned  Vitals stable and WFL throughout treatment session.    Home Living                      Prior Function            PT Goals (current goals can now be found in the care plan section) Acute Rehab PT Goals Patient Stated Goal: To go home PT Goal Formulation: With patient Time For Goal Achievement: 11/12/14 Potential to Achieve Goals: Good Progress towards PT goals: Progressing toward goals    Frequency  BID    PT Plan Current plan remains appropriate    Co-evaluation             End of Session Equipment Utilized During Treatment: Gait belt Activity Tolerance: Patient tolerated treatment well Patient left: in chair;with call bell/phone within reach;with chair alarm set;with family/visitor present     Time: 8299-3716 PT Time Calculation (min) (ACUTE ONLY): 38 min  Charges:  $Gait Training: 8-22 mins $Therapeutic Exercise: 8-22 mins $Therapeutic Activity: 8-22 mins                    G CodesLeitha Bleak 2014/11/26, 10:25 AM Leitha Bleak, [place]

## 2014-10-30 NOTE — Care Management (Signed)
FL2 needed. Patient plans to go to respite care at [facility]. PT is recommending HHPT. Insurance will not pay for SNF. Joelene Millin at [facility] checking on a price for patient. CSW updated.

## 2014-10-30 NOTE — Care Management (Signed)
Patient reluctant to decide on home health agency. PT can be provided at the [facility]. [facility] typically uses [place]. Referral called to [facility] with [place].

## 2014-10-30 NOTE — Progress Notes (Signed)
Pt. D/c'd to home. D/c instructions reviewed with pt. SL removed. Scripts given to pt. Pt. Has no further questions. Left via wheelchair with family.

## 2014-12-11 ENCOUNTER — Other Ambulatory Visit: Payer: Self-pay | Admitting: *Deleted

## 2014-12-11 DIAGNOSIS — C9110 Chronic lymphocytic leukemia of B-cell type not having achieved remission: Secondary | ICD-10-CM

## 2014-12-12 ENCOUNTER — Inpatient Hospital Stay: Payer: Medicare Other | Attending: Oncology

## 2014-12-12 ENCOUNTER — Inpatient Hospital Stay (HOSPITAL_BASED_OUTPATIENT_CLINIC_OR_DEPARTMENT_OTHER): Payer: Medicare Other | Admitting: Oncology

## 2014-12-12 VITALS — BP 173/74 | HR 86 | Temp 98.9°F | Resp 20 | Wt 110.9 lb

## 2014-12-12 DIAGNOSIS — R5383 Other fatigue: Secondary | ICD-10-CM | POA: Diagnosis not present

## 2014-12-12 DIAGNOSIS — Z8543 Personal history of malignant neoplasm of ovary: Secondary | ICD-10-CM | POA: Insufficient documentation

## 2014-12-12 DIAGNOSIS — Z87891 Personal history of nicotine dependence: Secondary | ICD-10-CM

## 2014-12-12 DIAGNOSIS — C9110 Chronic lymphocytic leukemia of B-cell type not having achieved remission: Secondary | ICD-10-CM

## 2014-12-12 DIAGNOSIS — Z79899 Other long term (current) drug therapy: Secondary | ICD-10-CM | POA: Insufficient documentation

## 2014-12-12 DIAGNOSIS — I10 Essential (primary) hypertension: Secondary | ICD-10-CM | POA: Diagnosis not present

## 2014-12-12 DIAGNOSIS — C9111 Chronic lymphocytic leukemia of B-cell type in remission: Secondary | ICD-10-CM | POA: Diagnosis present

## 2014-12-12 DIAGNOSIS — R0602 Shortness of breath: Secondary | ICD-10-CM

## 2014-12-12 DIAGNOSIS — D469 Myelodysplastic syndrome, unspecified: Secondary | ICD-10-CM

## 2014-12-12 DIAGNOSIS — R531 Weakness: Secondary | ICD-10-CM | POA: Insufficient documentation

## 2014-12-12 DIAGNOSIS — D649 Anemia, unspecified: Secondary | ICD-10-CM | POA: Diagnosis not present

## 2014-12-12 LAB — CBC WITH DIFFERENTIAL/PLATELET
Basophils Absolute: 0.1 10*3/uL (ref 0–0.1)
Basophils Relative: 2 %
Eosinophils Absolute: 0.1 10*3/uL (ref 0–0.7)
Eosinophils Relative: 2 %
HCT: 25.8 % — ABNORMAL LOW (ref 35.0–47.0)
Hemoglobin: 8.9 g/dL — ABNORMAL LOW (ref 12.0–16.0)
Lymphocytes Relative: 26 %
Lymphs Abs: 1.4 10*3/uL (ref 1.0–3.6)
MCH: 37.4 pg — ABNORMAL HIGH (ref 26.0–34.0)
MCHC: 34.6 g/dL (ref 32.0–36.0)
MCV: 108 fL — ABNORMAL HIGH (ref 80.0–100.0)
Monocytes Absolute: 0.5 10*3/uL (ref 0.2–0.9)
Monocytes Relative: 9 %
Neutro Abs: 3.3 10*3/uL (ref 1.4–6.5)
Neutrophils Relative %: 61 %
Platelets: 289 10*3/uL (ref 150–440)
RBC: 2.39 MIL/uL — ABNORMAL LOW (ref 3.80–5.20)
RDW: 17.5 % — ABNORMAL HIGH (ref 11.5–14.5)
WBC: 5.3 10*3/uL (ref 3.6–11.0)

## 2014-12-12 MED ORDER — HYDROCODONE-ACETAMINOPHEN 5-325 MG PO TABS: 1.0000 | ORAL_TABLET | Freq: Three times a day (TID) | ORAL | Status: DC | PRN

## 2014-12-12 NOTE — Progress Notes (Signed)
F/U visit for CLL. Continues to live in an independent cottage at [employer] at [place].  Had a R shoulder replacement in August if this year. Still having outpatient PT. Arm is in sling in office. She reports that arm and shoulder hurts a lot at bedtime. Appetite is poor. Energy is low. The "burning sensation is back it is in her vagina and actually is in her body too". No blood in stools. Has some constipation from her pain pills. Takes occ stool softener. Arthritis is very painful in her fingers, joints are very swollen.

## 2014-12-26 NOTE — Progress Notes (Signed)
[place]  Telephone:(336) [phone] Fax:(336) [phone]  ID: Jacqueline Russo OB: Jun 21, 1929  MR#: [id number]  YHC#:[id number]  Patient Care Team: Glendon Axe, MD as PCP - General (Internal Medicine)  CHIEF COMPLAINT: No chief complaint on file.   INTERVAL HISTORY: Patient returns to clinic today for repeat laboratory work and routine 6 month evaluation. She continues to have a "burning sensation" in her lower abdomen. She also recently had a right shoulder replacement and complains of occasional pain particularly at night. She has decreased energy and states she has a poor appetite, but has no weight loss. She has no neurologic complaints.  She denies any fevers or night sweats. She denies any nausea, vomiting, constipation, or diarrhea.  Patient offers no further specific complaints today.   REVIEW OF SYSTEMS:   Review of Systems  Constitutional: Positive for malaise/fatigue. Negative for fever and weight loss.  Respiratory: Positive for shortness of breath.   Cardiovascular: Negative.   Gastrointestinal: Positive for abdominal pain.  Genitourinary: Negative.   Musculoskeletal: Positive for joint pain.  Neurological: Positive for weakness.  Psychiatric/Behavioral: Negative.     As per HPI. Otherwise, a complete review of systems is negatve.  PAST MEDICAL HISTORY: Past Medical History  Diagnosis Date  . Hypertension   . Arthritis   . Ovarian cancer 1963  . Leukemia, chronic lymphoid     CLL  . Anemia     Myelodysplastic syndrome    PAST SURGICAL HISTORY: Past Surgical History  Procedure Laterality Date  . Abdominal hysterectomy    . Eye surgery Bilateral     Cataract Extractions  . Appendectomy    . Breast surgery      cyst removed from right breast  . Reverse shoulder arthroplasty Right 10/28/2014    Procedure: REVERSE SHOULDER ARTHROPLASTY;  Surgeon: Corky Mull, MD;  Location: [facility] ORS;  Service: Orthopedics;  Laterality: Right;    FAMILY  HISTORY No family history on file.     ADVANCED DIRECTIVES:    HEALTH MAINTENANCE: Social History  Substance Use Topics  . Smoking status: Former Smoker -- 2.00 packs/day    Types: Cigarettes  . Smokeless tobacco: Never Used  . Alcohol Use: 0.6 oz/week    1 Glasses of wine per week     Comment: 1 glass of wine daily     Colonoscopy:  PAP:  Bone density:  Lipid panel:  No Known Allergies  Current Outpatient Prescriptions  Medication Sig Dispense Refill  . amLODipine (NORVASC) 5 MG tablet Take 5 mg by mouth at bedtime.    . diphenhydrAMINE (BENADRYL) 50 MG tablet Take 50 mg by mouth at bedtime as needed for itching.    . docusate sodium (COLACE) 100 MG capsule Take 100 mg by mouth daily as needed for mild constipation.    . hydrochlorothiazide (HYDRODIURIL) 25 MG tablet Take 25 mg by mouth daily.    Marland Kitchen HYDROcodone-acetaminophen (NORCO/VICODIN) 5-325 MG per tablet Take 1 tablet by mouth 3 (three) times daily as needed for moderate pain. 60 tablet 0  . lisinopril (PRINIVIL,ZESTRIL) 40 MG tablet Take 40 mg by mouth every morning.    . traMADol-acetaminophen (ULTRACET) 37.5-325 MG per tablet Take 1 tablet by mouth every 8 (eight) hours as needed.     No current facility-administered medications for this visit.    OBJECTIVE: Filed Vitals:   12/12/14 1100  BP: 173/74  Pulse: 86  Temp: 98.9 F (37.2 C)  Resp: 20     Body mass index is 21.66  kg/(m^2).    ECOG FS:1 - Symptomatic but completely ambulatory  General: Well-developed, well-nourished, no acute distress. Eyes: Pink conjunctiva, anicteric sclera. Lungs: Clear to auscultation bilaterally. Heart: Regular rate and rhythm. No rubs, murmurs, or gallops. Abdomen: Soft, nontender, nondistended. No organomegaly noted, normoactive bowel sounds. Musculoskeletal: No edema, cyanosis, or clubbing. Neuro: Alert, answering all questions appropriately. Cranial nerves grossly intact. Skin: No rashes or petechiae noted. Psych:  Normal affect. Lymphatics: No cervical, calvicular, axillary or inguinal LAD.   LAB RESULTS:  Lab Results  Component Value Date   NA 130* 10/30/2014   K 3.5 10/30/2014   CL 93* 10/30/2014   CO2 27 10/30/2014   GLUCOSE 180* 10/30/2014   BUN 12 10/30/2014   CREATININE 0.83 10/30/2014   CALCIUM 8.3* 10/30/2014   PROT 7.3 03/27/2013   ALBUMIN 4.1 03/27/2013   AST 12* 03/27/2013   ALT 20 03/27/2013   ALKPHOS 87 03/27/2013   BILITOT 0.5 03/27/2013   GFRNONAA >60 10/30/2014   GFRAA >60 10/30/2014    Lab Results  Component Value Date   WBC 5.3 12/12/2014   NEUTROABS 3.3 12/12/2014   HGB 8.9* 12/12/2014   HCT 25.8* 12/12/2014   MCV 108.0* 12/12/2014   PLT 289 12/12/2014     STUDIES: No results found.  ASSESSMENT: MDS 5q-, CLL   PLAN:    1.  CLL:  CT scan results from June 09, 2014 were independently reviewed and did not reveal any evidence of recurrent disease. All of her labwork is also either negative or within normal limits. Patient last received single agent Rituxan in February 2015. No treatment is needed at this time. Patient expressed understanding that she will likely need to reinitiate treatment at some point in the future.  Return to clinic in 3 months for laboratory work only and then in 6 months with repeat laboratory work and further evaluation.   2.  Anemia:  Hemoglobin has trended down slightly, but the remainder of her anemia workup is within normal limits. 3.  Thrombocytopenia:  Platelet count is now within normal limits. 4.  MDS, 5q-:  Continue to monitor, no bone marrow biopsy is not needed at this time, plus patient refuses one anyway.  Patient continues to refuse treatment with Revlimid. 5. Weakness, fatigue, shortness of breath: Likely multifactorial and unrelated to CLL or  MDS.  Patient expressed understanding and was in agreement with this plan. She also understands that She can call clinic at any time with any questions, concerns, or complaints.    Lloyd Huger, MD   12/26/2014 1:23 PM

## 2015-01-21 ENCOUNTER — Other Ambulatory Visit: Payer: Self-pay | Admitting: *Deleted

## 2015-01-21 ENCOUNTER — Telehealth: Payer: Self-pay | Admitting: Oncology

## 2015-01-21 DIAGNOSIS — C9110 Chronic lymphocytic leukemia of B-cell type not having achieved remission: Secondary | ICD-10-CM

## 2015-01-21 MED ORDER — HYDROCODONE-ACETAMINOPHEN 5-325 MG PO TABS: 1.0000 | ORAL_TABLET | Freq: Three times a day (TID) | ORAL | Status: DC | PRN

## 2015-01-21 NOTE — Telephone Encounter (Signed)
She needs a refill on her hydrocodone Rx. Thanks.

## 2015-01-21 NOTE — Telephone Encounter (Signed)
Would she like to wait until Friday when we are in [place]?

## 2015-01-21 NOTE — Telephone Encounter (Signed)
Prescription printed, patient can pick up in [place] or we can bring RX to [place] on Friday.

## 2015-02-23 ENCOUNTER — Telehealth: Payer: Self-pay | Admitting: Oncology

## 2015-02-23 ENCOUNTER — Other Ambulatory Visit: Payer: Self-pay | Admitting: *Deleted

## 2015-02-23 DIAGNOSIS — C9110 Chronic lymphocytic leukemia of B-cell type not having achieved remission: Secondary | ICD-10-CM

## 2015-02-23 MED ORDER — HYDROCODONE-ACETAMINOPHEN 5-325 MG PO TABS: 1.0000 | ORAL_TABLET | Freq: Three times a day (TID) | ORAL | Status: DC | PRN

## 2015-02-23 NOTE — Telephone Encounter (Signed)
She needs a refill on her hydrocodone Rx and will pick it up in [place]. Thanks.

## 2015-02-23 NOTE — Telephone Encounter (Signed)
Prescription printed

## 2015-03-05 ENCOUNTER — Other Ambulatory Visit: Payer: Self-pay | Admitting: *Deleted

## 2015-03-05 DIAGNOSIS — C9110 Chronic lymphocytic leukemia of B-cell type not having achieved remission: Secondary | ICD-10-CM

## 2015-03-06 ENCOUNTER — Other Ambulatory Visit: Payer: Medicare Other

## 2015-03-09 ENCOUNTER — Inpatient Hospital Stay: Payer: Medicare Other | Attending: Oncology

## 2015-03-09 DIAGNOSIS — C9110 Chronic lymphocytic leukemia of B-cell type not having achieved remission: Secondary | ICD-10-CM

## 2015-03-09 DIAGNOSIS — C9111 Chronic lymphocytic leukemia of B-cell type in remission: Secondary | ICD-10-CM | POA: Diagnosis present

## 2015-03-09 LAB — CBC WITH DIFFERENTIAL/PLATELET
Basophils Absolute: 0.1 10*3/uL (ref 0–0.1)
Basophils Relative: 3 %
Eosinophils Absolute: 0.1 10*3/uL (ref 0–0.7)
Eosinophils Relative: 4 %
HCT: 25.3 % — ABNORMAL LOW (ref 35.0–47.0)
Hemoglobin: 8.6 g/dL — ABNORMAL LOW (ref 12.0–16.0)
Lymphocytes Relative: 34 %
Lymphs Abs: 1.2 10*3/uL (ref 1.0–3.6)
MCH: 37.7 pg — ABNORMAL HIGH (ref 26.0–34.0)
MCHC: 34.1 g/dL (ref 32.0–36.0)
MCV: 110.5 fL — ABNORMAL HIGH (ref 80.0–100.0)
Monocytes Absolute: 0.4 10*3/uL (ref 0.2–0.9)
Monocytes Relative: 10 %
Neutro Abs: 1.8 10*3/uL (ref 1.4–6.5)
Neutrophils Relative %: 49 %
Platelets: 260 10*3/uL (ref 150–440)
RBC: 2.29 MIL/uL — ABNORMAL LOW (ref 3.80–5.20)
RDW: 15.9 % — ABNORMAL HIGH (ref 11.5–14.5)
WBC: 3.6 10*3/uL (ref 3.6–11.0)

## 2015-03-09 LAB — BASIC METABOLIC PANEL
Anion gap: 9 (ref 5–15)
BUN: 24 mg/dL — ABNORMAL HIGH (ref 6–20)
CO2: 26 mmol/L (ref 22–32)
Calcium: 9.9 mg/dL (ref 8.9–10.3)
Chloride: 103 mmol/L (ref 101–111)
Creatinine, Ser: 0.89 mg/dL (ref 0.44–1.00)
GFR calc Af Amer: >60 mL/min (ref >60)
GFR calc non Af Amer: 58 mL/min — ABNORMAL LOW (ref >60)
Glucose, Bld: 154 mg/dL — ABNORMAL HIGH (ref 65–99)
Potassium: 3.3 mmol/L — ABNORMAL LOW (ref 3.5–5.1)
Sodium: 138 mmol/L (ref 135–145)

## 2015-03-09 LAB — LACTATE DEHYDROGENASE: LDH: 133 U/L (ref 98–192)

## 2015-03-25 ENCOUNTER — Telehealth: Payer: Self-pay | Admitting: *Deleted

## 2015-03-25 NOTE — Telephone Encounter (Signed)
Patient called. She inquired about her lab results from 03/09/15.  Patient identification process performed. Results provided to the patient.

## 2015-03-27 ENCOUNTER — Other Ambulatory Visit: Payer: Self-pay

## 2015-03-27 ENCOUNTER — Telehealth: Payer: Self-pay | Admitting: Oncology

## 2015-03-27 DIAGNOSIS — C9110 Chronic lymphocytic leukemia of B-cell type not having achieved remission: Secondary | ICD-10-CM

## 2015-03-27 MED ORDER — HYDROCODONE-ACETAMINOPHEN 5-325 MG PO TABS: 1.0000 | ORAL_TABLET | Freq: Three times a day (TID) | ORAL | Status: DC | PRN

## 2015-03-27 NOTE — Telephone Encounter (Signed)
rx printed

## 2015-03-27 NOTE — Telephone Encounter (Signed)
She needs a refill on her hydrocodone Rx and hopes to be able to get it in time before the snow starts. Thanks!

## 2015-04-22 DIAGNOSIS — E7800 Pure hypercholesterolemia, unspecified: Secondary | ICD-10-CM | POA: Insufficient documentation

## 2015-04-22 DIAGNOSIS — E119 Type 2 diabetes mellitus without complications: Secondary | ICD-10-CM | POA: Insufficient documentation

## 2015-04-22 DIAGNOSIS — N183 Chronic kidney disease, stage 3 unspecified: Secondary | ICD-10-CM | POA: Insufficient documentation

## 2015-04-29 ENCOUNTER — Telehealth: Payer: Self-pay | Admitting: *Deleted

## 2015-04-29 DIAGNOSIS — C9110 Chronic lymphocytic leukemia of B-cell type not having achieved remission: Secondary | ICD-10-CM

## 2015-04-29 MED ORDER — HYDROCODONE-ACETAMINOPHEN 5-325 MG PO TABS: 1.0000 | ORAL_TABLET | Freq: Three times a day (TID) | ORAL | Status: DC | PRN

## 2015-04-29 NOTE — Telephone Encounter (Signed)
Informed that prescription is ready to pick up  

## 2015-05-18 ENCOUNTER — Telehealth: Payer: Self-pay | Admitting: Oncology

## 2015-05-18 NOTE — Telephone Encounter (Signed)
She has swelling in her feet, ankles, and legs and she has never had this happen before. She is not sure whether to go to PCP or [place]. She thinks her PCP will tell her to go to [place] anyway so she wanted to check with [place] first. Please advise. Thanks.

## 2015-05-20 NOTE — Telephone Encounter (Signed)
i think pcp first.  Unlikely related to her underlying CLL.

## 2015-05-20 NOTE — Telephone Encounter (Signed)
Forwarded to MD.

## 2015-05-20 NOTE — Telephone Encounter (Signed)
Left message informing patient to contact PCP.

## 2015-05-28 ENCOUNTER — Other Ambulatory Visit: Payer: Self-pay | Admitting: *Deleted

## 2015-05-28 DIAGNOSIS — C9110 Chronic lymphocytic leukemia of B-cell type not having achieved remission: Secondary | ICD-10-CM

## 2015-05-28 MED ORDER — HYDROCODONE-ACETAMINOPHEN 5-325 MG PO TABS: 1.0000 | ORAL_TABLET | Freq: Three times a day (TID) | ORAL | Status: DC | PRN

## 2015-06-12 ENCOUNTER — Inpatient Hospital Stay: Payer: Medicare Other | Attending: Oncology

## 2015-06-12 ENCOUNTER — Inpatient Hospital Stay (HOSPITAL_BASED_OUTPATIENT_CLINIC_OR_DEPARTMENT_OTHER): Payer: Medicare Other | Admitting: Oncology

## 2015-06-12 VITALS — BP 178/68 | HR 97 | Temp 98.2°F | Resp 18 | Wt 117.5 lb

## 2015-06-12 DIAGNOSIS — M1990 Unspecified osteoarthritis, unspecified site: Secondary | ICD-10-CM

## 2015-06-12 DIAGNOSIS — C9110 Chronic lymphocytic leukemia of B-cell type not having achieved remission: Secondary | ICD-10-CM

## 2015-06-12 DIAGNOSIS — D46C Myelodysplastic syndrome with isolated del(5q) chromosomal abnormality: Secondary | ICD-10-CM | POA: Diagnosis not present

## 2015-06-12 DIAGNOSIS — Z90710 Acquired absence of both cervix and uterus: Secondary | ICD-10-CM | POA: Insufficient documentation

## 2015-06-12 DIAGNOSIS — Z79899 Other long term (current) drug therapy: Secondary | ICD-10-CM

## 2015-06-12 DIAGNOSIS — C9111 Chronic lymphocytic leukemia of B-cell type in remission: Secondary | ICD-10-CM | POA: Insufficient documentation

## 2015-06-12 DIAGNOSIS — Z8543 Personal history of malignant neoplasm of ovary: Secondary | ICD-10-CM

## 2015-06-12 DIAGNOSIS — D649 Anemia, unspecified: Secondary | ICD-10-CM | POA: Insufficient documentation

## 2015-06-12 DIAGNOSIS — Z87891 Personal history of nicotine dependence: Secondary | ICD-10-CM | POA: Insufficient documentation

## 2015-06-12 LAB — CBC WITH DIFFERENTIAL/PLATELET
Basophils Absolute: 0.1 10*3/uL (ref 0–0.1)
Basophils Relative: 4 %
Eosinophils Absolute: 0.1 10*3/uL (ref 0–0.7)
Eosinophils Relative: 4 %
HCT: 26.3 % — ABNORMAL LOW (ref 35.0–47.0)
Hemoglobin: 9.1 g/dL — ABNORMAL LOW (ref 12.0–16.0)
Lymphocytes Relative: 26 %
Lymphs Abs: 0.9 10*3/uL — ABNORMAL LOW (ref 1.0–3.6)
MCH: 38.8 pg — ABNORMAL HIGH (ref 26.0–34.0)
MCHC: 34.8 g/dL (ref 32.0–36.0)
MCV: 111.6 fL — ABNORMAL HIGH (ref 80.0–100.0)
Monocytes Absolute: 0.4 10*3/uL (ref 0.2–0.9)
Monocytes Relative: 11 %
Neutro Abs: 1.9 10*3/uL (ref 1.4–6.5)
Neutrophils Relative %: 55 %
Platelets: 281 10*3/uL (ref 150–440)
RBC: 2.36 MIL/uL — ABNORMAL LOW (ref 3.80–5.20)
RDW: 15.5 % — ABNORMAL HIGH (ref 11.5–14.5)
WBC: 3.4 10*3/uL — ABNORMAL LOW (ref 3.6–11.0)

## 2015-06-12 LAB — BASIC METABOLIC PANEL
Anion gap: 7 (ref 5–15)
BUN: 21 mg/dL — ABNORMAL HIGH (ref 6–20)
CO2: 25 mmol/L (ref 22–32)
Calcium: 9.7 mg/dL (ref 8.9–10.3)
Chloride: 104 mmol/L (ref 101–111)
Creatinine, Ser: 0.9 mg/dL (ref 0.44–1.00)
GFR calc Af Amer: >60 mL/min (ref >60)
GFR calc non Af Amer: 58 mL/min — ABNORMAL LOW (ref >60)
Glucose, Bld: 158 mg/dL — ABNORMAL HIGH (ref 65–99)
Potassium: 3.6 mmol/L (ref 3.5–5.1)
Sodium: 136 mmol/L (ref 135–145)

## 2015-06-12 LAB — LACTATE DEHYDROGENASE: LDH: 141 U/L (ref 98–192)

## 2015-06-12 NOTE — Progress Notes (Signed)
Patient does not offer any problems today.  

## 2015-06-18 ENCOUNTER — Other Ambulatory Visit: Payer: Self-pay | Admitting: Ophthalmology

## 2015-06-18 DIAGNOSIS — H538 Other visual disturbances: Secondary | ICD-10-CM

## 2015-06-19 ENCOUNTER — Other Ambulatory Visit: Admission: RE | Admit: 2015-06-19 | Payer: Medicare Other | Source: Ambulatory Visit | Admitting: Ophthalmology

## 2015-06-19 ENCOUNTER — Ambulatory Visit
Admission: RE | Admit: 2015-06-19 | Discharge: 2015-06-19 | Disposition: A | Discharge status: Home / Self Care | Payer: Medicare Other | Source: Ambulatory Visit | Attending: Ophthalmology | Admitting: Ophthalmology

## 2015-06-19 DIAGNOSIS — I6523 Occlusion and stenosis of bilateral carotid arteries: Secondary | ICD-10-CM | POA: Diagnosis not present

## 2015-06-19 DIAGNOSIS — H538 Other visual disturbances: Secondary | ICD-10-CM | POA: Insufficient documentation

## 2015-06-23 NOTE — Progress Notes (Signed)
[place]  Telephone:(336) [phone] Fax:(336) [phone]  ID: Jacqueline Russo OB: May 04, 1929  MR#: [id number]  [name]  Patient Care Team: Glendon Axe, MD as PCP - General (Internal Medicine)  CHIEF COMPLAINT:  Chief Complaint  Patient presents with  . CLL    INTERVAL HISTORY: Patient returns to clinic today for repeat laboratory work and routine 6 month evaluation. She currently feels well and is asymptomatic. She does not complain of weakness or fatigue today.  She has no neurologic complaints. She denies any recent fevers or illnesses. She denies any night sweats or weight loss. She has no chest pain or shortness of breath. She denies any fevers or night sweats. She denies any nausea, vomiting, constipation, or diarrhea.  Patient offers no specific complaints today.   REVIEW OF SYSTEMS:   Review of Systems  Constitutional: Negative for fever and weight loss.  Respiratory: Negative.  Negative for shortness of breath.   Cardiovascular: Negative.  Negative for chest pain.  Gastrointestinal: Negative.  Negative for abdominal pain, blood in stool and melena.  Genitourinary: Negative.   Musculoskeletal: Negative.  Negative for joint pain.  Neurological: Negative.   Psychiatric/Behavioral: Negative.     As per HPI. Otherwise, a complete review of systems is negatve.  PAST MEDICAL HISTORY: Past Medical History  Diagnosis Date  . Hypertension   . Arthritis   . Ovarian cancer 1963  . Leukemia, chronic lymphoid     CLL  . Anemia     Myelodysplastic syndrome    PAST SURGICAL HISTORY: Past Surgical History  Procedure Laterality Date  . Abdominal hysterectomy    . Eye surgery Bilateral     Cataract Extractions  . Appendectomy    . Breast surgery      cyst removed from right breast  . Reverse shoulder arthroplasty Right 10/28/2014    Procedure: REVERSE SHOULDER ARTHROPLASTY;  Surgeon: Corky Mull, MD;  Location: [facility] ORS;  Service: Orthopedics;   Laterality: Right;    FAMILY HISTORY: Reviewed and unchanged. No reported history of malignancy or chronic disease.     ADVANCED DIRECTIVES:    HEALTH MAINTENANCE: Social History  Substance Use Topics  . Smoking status: Former Smoker -- 2.00 packs/day    Types: Cigarettes  . Smokeless tobacco: Never Used  . Alcohol Use: 0.6 oz/week    1 Glasses of wine per week     Comment: 1 glass of wine daily     Colonoscopy:  PAP:  Bone density:  Lipid panel:  No Known Allergies  Current Outpatient Prescriptions  Medication Sig Dispense Refill  . amLODipine (NORVASC) 5 MG tablet Take 5 mg by mouth at bedtime.    . diphenhydrAMINE (BENADRYL) 50 MG tablet Take 50 mg by mouth at bedtime as needed for itching.    . docusate sodium (COLACE) 100 MG capsule Take 100 mg by mouth daily as needed for mild constipation.    . hydrALAZINE (APRESOLINE) 25 MG tablet Take by mouth.    . hydrochlorothiazide (HYDRODIURIL) 25 MG tablet Take 25 mg by mouth daily.    Marland Kitchen HYDROcodone-acetaminophen (NORCO/VICODIN) 5-325 MG tablet Take 1 tablet by mouth 3 (three) times daily as needed for moderate pain. 60 tablet 0  . lisinopril (PRINIVIL,ZESTRIL) 40 MG tablet Take 40 mg by mouth every morning.    . traMADol-acetaminophen (ULTRACET) 37.5-325 MG per tablet Take 1 tablet by mouth every 8 (eight) hours as needed.     No current facility-administered medications for this visit.    OBJECTIVE:  Filed Vitals:   06/12/15 0927  BP: 178/68  Pulse: 97  Temp: 98.2 F (36.8 C)  Resp: 18     Body mass index is 22.95 kg/(m^2).    ECOG FS:0 - Asymptomatic  General: Well-developed, well-nourished, no acute distress. Eyes: Pink conjunctiva, anicteric sclera. Lungs: Clear to auscultation bilaterally. Heart: Regular rate and rhythm. No rubs, murmurs, or gallops. Abdomen: Soft, nontender, nondistended. No organomegaly noted, normoactive bowel sounds. Musculoskeletal: No edema, cyanosis, or clubbing. Neuro: Alert,  answering all questions appropriately. Cranial nerves grossly intact. Skin: No rashes or petechiae noted. Psych: Normal affect. Lymphatics: No cervical, calvicular, axillary or inguinal LAD.   LAB RESULTS:  Lab Results  Component Value Date   NA 136 06/12/2015   K 3.6 06/12/2015   CL 104 06/12/2015   CO2 25 06/12/2015   GLUCOSE 158* 06/12/2015   BUN 21* 06/12/2015   CREATININE 0.90 06/12/2015   CALCIUM 9.7 06/12/2015   PROT 7.3 03/27/2013   ALBUMIN 4.1 03/27/2013   AST 12* 03/27/2013   ALT 20 03/27/2013   ALKPHOS 87 03/27/2013   BILITOT 0.5 03/27/2013   GFRNONAA 58* 06/12/2015   GFRAA >60 06/12/2015    Lab Results  Component Value Date   WBC 3.4* 06/12/2015   NEUTROABS 1.9 06/12/2015   HGB 9.1* 06/12/2015   HCT 26.3* 06/12/2015   MCV 111.6* 06/12/2015   PLT 281 06/12/2015     STUDIES: US Carotid Bilateral  06/19/2015  CLINICAL DATA:  82 year old female with blurred vision EXAM: BILATERAL CAROTID DUPLEX ULTRASOUND TECHNIQUE: Pearline Cables scale imaging, color Doppler and duplex ultrasound were performed of bilateral carotid and vertebral arteries in the neck. COMPARISON:  Prior CT scan of the neck and chest 06/09/2014 FINDINGS: Criteria: Quantification of carotid stenosis is based on velocity parameters that correlate the residual internal carotid diameter with NASCET-based stenosis levels, using the diameter of the distal internal carotid lumen as the denominator for stenosis measurement. The following velocity measurements were obtained: RIGHT ICA:  162/37 cm/sec CCA:  638/46 cm/sec SYSTOLIC ICA/CCA RATIO:  1.3 DIASTOLIC ICA/CCA RATIO:  1.6 ECA:  203 cm/sec LEFT ICA:  218/41 cm/sec CCA:  659/93 cm/sec SYSTOLIC ICA/CCA RATIO:  1.9 DIASTOLIC ICA/CCA RATIO:  1.8 ECA:  220 cm/sec RIGHT CAROTID ARTERY: Heterogeneous atherosclerotic plaque in the carotid bifurcation and proximal internal carotid artery. By peak systolic velocity criteria there is an estimated 50- 69% diameter stenosis.  RIGHT VERTEBRAL ARTERY:  Patent with normal antegrade flow. LEFT CAROTID ARTERY: Heterogeneous and irregular atherosclerotic plaque in the carotid bifurcation and proximal internal carotid artery. By peak systolic velocity criteria there is an estimated 50- 69% diameter stenosis. LEFT VERTEBRAL ARTERY:  Patent with normal antegrade flow. IMPRESSION: 1. Moderate (50-69%) stenosis proximal right internal carotid artery secondary to focal moderate heterogeneous atherosclerotic plaque. 2. Moderate (50-69%) stenosis proximal left internal carotid artery secondary to irregular heterogeneous atherosclerotic plaque. Of note, the stenosis is likely at the higher end of the provided range as the peak systolic velocity nearly meets criteria for a greater than 70% diameter stenosis. 3. Vertebral arteries are patent with normal antegrade flow. Signed, Criselda Peaches, MD [facility] Electronically Signed   By: Jacqulynn Cadet M.D.   On: 06/19/2015 16:00    ASSESSMENT: MDS 5q-, CLL   PLAN:    1.  CLL:  CT scan results from June 09, 2014 were independently reviewed and did not reveal any evidence of recurrent disease. All of her labwork is also either negative  or within normal limits. Patient last received single agent Rituxan in February 2015. No treatment is needed at this time. Patient expressed understanding that she will may need to reinitiate treatment at some point in the future.  Return to clinic in 3 months for laboratory work only and then in 6 months with repeat laboratory work and further evaluation.   2.  Anemia:  Hemoglobin has trended down slightly, but the remainder of her anemia workup is within normal limits. 3.  Thrombocytopenia:  Platelet count is now within normal limits. 4.  MDS, 5q-:  Continue to monitor, no bone marrow biopsy is not needed at this time, plus patient refuses one anyway.  Patient continues to refuse treatment with  Revlimid.  Patient expressed understanding and was in agreement with this plan. She also understands that She can call clinic at any time with any questions, concerns, or complaints.   Lloyd Huger, MD   06/23/2015 11:25 PM

## 2015-06-26 LAB — MISC LABCORP TEST (SEND OUT): Labcorp test code: 85933

## 2015-06-29 ENCOUNTER — Other Ambulatory Visit: Payer: Self-pay | Admitting: *Deleted

## 2015-06-29 DIAGNOSIS — C9110 Chronic lymphocytic leukemia of B-cell type not having achieved remission: Secondary | ICD-10-CM

## 2015-06-29 MED ORDER — HYDROCODONE-ACETAMINOPHEN 5-325 MG PO TABS: 1.0000 | ORAL_TABLET | Freq: Three times a day (TID) | ORAL | Status: DC | PRN

## 2015-07-21 DIAGNOSIS — I6523 Occlusion and stenosis of bilateral carotid arteries: Secondary | ICD-10-CM | POA: Insufficient documentation

## 2015-07-21 DIAGNOSIS — I6529 Occlusion and stenosis of unspecified carotid artery: Secondary | ICD-10-CM | POA: Insufficient documentation

## 2015-07-21 DIAGNOSIS — N182 Chronic kidney disease, stage 2 (mild): Secondary | ICD-10-CM | POA: Insufficient documentation

## 2015-07-28 ENCOUNTER — Other Ambulatory Visit: Payer: Self-pay | Admitting: *Deleted

## 2015-07-28 DIAGNOSIS — C9110 Chronic lymphocytic leukemia of B-cell type not having achieved remission: Secondary | ICD-10-CM

## 2015-07-28 MED ORDER — HYDROCODONE-ACETAMINOPHEN 5-325 MG PO TABS
1.0000 | ORAL_TABLET | Freq: Three times a day (TID) | ORAL | Status: DC | PRN
Start: 2015-07-28 — End: 2015-08-27

## 2015-07-28 NOTE — Telephone Encounter (Signed)
States she is anemic and her PCP wants to put her on B 12 injs. She just wants to check with Dr Grayland Ormond to see if he agrees with this

## 2015-07-28 NOTE — Telephone Encounter (Signed)
Per Dr. Grayland Ormond B12 injections are fine.

## 2015-07-28 NOTE — Telephone Encounter (Signed)
Returned call to pt informed per Dr Grayland Ormond, [place] for B 12 inj

## 2015-08-27 ENCOUNTER — Other Ambulatory Visit: Payer: Self-pay | Admitting: *Deleted

## 2015-08-27 DIAGNOSIS — C9110 Chronic lymphocytic leukemia of B-cell type not having achieved remission: Secondary | ICD-10-CM

## 2015-08-27 MED ORDER — HYDROCODONE-ACETAMINOPHEN 5-325 MG PO TABS: 1.0000 | ORAL_TABLET | Freq: Three times a day (TID) | ORAL | Status: DC | PRN

## 2015-09-10 ENCOUNTER — Other Ambulatory Visit: Payer: Self-pay | Admitting: *Deleted

## 2015-09-10 DIAGNOSIS — C9110 Chronic lymphocytic leukemia of B-cell type not having achieved remission: Secondary | ICD-10-CM

## 2015-09-11 ENCOUNTER — Inpatient Hospital Stay: Payer: Medicare Other | Attending: Oncology

## 2015-09-28 ENCOUNTER — Other Ambulatory Visit: Payer: Self-pay | Admitting: *Deleted

## 2015-09-28 DIAGNOSIS — C9110 Chronic lymphocytic leukemia of B-cell type not having achieved remission: Secondary | ICD-10-CM

## 2015-09-28 MED ORDER — HYDROCODONE-ACETAMINOPHEN 5-325 MG PO TABS: 1.0000 | ORAL_TABLET | Freq: Three times a day (TID) | ORAL | Status: DC | PRN

## 2015-10-22 DIAGNOSIS — D531 Other megaloblastic anemias, not elsewhere classified: Secondary | ICD-10-CM | POA: Insufficient documentation

## 2015-10-22 DIAGNOSIS — G4762 Sleep related leg cramps: Secondary | ICD-10-CM | POA: Insufficient documentation

## 2015-10-30 ENCOUNTER — Other Ambulatory Visit: Payer: Self-pay | Admitting: *Deleted

## 2015-10-30 DIAGNOSIS — C9110 Chronic lymphocytic leukemia of B-cell type not having achieved remission: Secondary | ICD-10-CM

## 2015-10-30 MED ORDER — HYDROCODONE-ACETAMINOPHEN 5-325 MG PO TABS: 1.0000 | ORAL_TABLET | Freq: Three times a day (TID) | ORAL | 0 refills | Status: DC | PRN

## 2015-11-08 ENCOUNTER — Emergency Department
Admission: EM | Admit: 2015-11-08 | Discharge: 2015-11-08 | Disposition: A | Discharge status: Home / Self Care | Payer: Medicare Other | Attending: Emergency Medicine | Admitting: Emergency Medicine

## 2015-11-08 ENCOUNTER — Emergency Department: Payer: Medicare Other

## 2015-11-08 DIAGNOSIS — Y929 Unspecified place or not applicable: Secondary | ICD-10-CM | POA: Diagnosis not present

## 2015-11-08 DIAGNOSIS — S2232XA Fracture of one rib, left side, initial encounter for closed fracture: Secondary | ICD-10-CM | POA: Diagnosis not present

## 2015-11-08 DIAGNOSIS — W1839XA Other fall on same level, initial encounter: Secondary | ICD-10-CM | POA: Insufficient documentation

## 2015-11-08 DIAGNOSIS — Z79899 Other long term (current) drug therapy: Secondary | ICD-10-CM | POA: Diagnosis not present

## 2015-11-08 DIAGNOSIS — S299XXA Unspecified injury of thorax, initial encounter: Secondary | ICD-10-CM | POA: Diagnosis present

## 2015-11-08 DIAGNOSIS — I10 Essential (primary) hypertension: Secondary | ICD-10-CM | POA: Diagnosis not present

## 2015-11-08 DIAGNOSIS — Z87891 Personal history of nicotine dependence: Secondary | ICD-10-CM | POA: Insufficient documentation

## 2015-11-08 DIAGNOSIS — Y999 Unspecified external cause status: Secondary | ICD-10-CM | POA: Diagnosis not present

## 2015-11-08 DIAGNOSIS — Y939 Activity, unspecified: Secondary | ICD-10-CM | POA: Diagnosis not present

## 2015-11-08 MED ORDER — HYDROCODONE-ACETAMINOPHEN 5-325 MG PO TABS
1.0000 | ORAL_TABLET | Freq: Once | ORAL | Status: AC
  Administered 2015-11-08: 1 via ORAL
  Filled 2015-11-08: qty 1

## 2015-11-08 MED ORDER — HYDROCODONE-ACETAMINOPHEN 10-325 MG PO TABS
ORAL_TABLET | ORAL | Status: AC
  Filled 2015-11-08: qty 1

## 2015-11-08 MED ORDER — HYDROCODONE-ACETAMINOPHEN 5-325 MG PO TABS: 1.0000 | ORAL_TABLET | ORAL | 0 refills | Status: DC | PRN

## 2015-11-08 NOTE — ED Provider Notes (Signed)
[facility] Emergency Department Provider Note ____________________________________________   I have reviewed the triage vital signs and the triage nursing note.  HISTORY  Chief Complaint Fall   Historian Patient  HPI Jacqueline Russo is a 82 y.o. female with a history of CLL for which she takes chronic hydrocodone, is here for fall into her night stand. She states that she just got off balance, she thinks it was because her legs are spasming, and she takes a muscle relaxer for that. She is complaining of pain to her left posterior back/flank over the rib.  Pain is moderate to severe.  Pain is worse with a deep breath. No abdominal pain. No head injury. No neck pain or neck injury.    Past Medical History:  Diagnosis Date  . Anemia    Myelodysplastic syndrome  . Arthritis   . Hypertension   . Leukemia, chronic lymphoid (HCC)    CLL  . Ovarian cancer [facility]) 1963    Patient Active Problem List   Diagnosis Date Noted  . 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 ABDOMINAL HYSTERECTOMY    . APPENDECTOMY    . BREAST SURGERY     cyst removed from right breast  . EYE SURGERY Bilateral    Cataract Extractions  . REVERSE SHOULDER ARTHROPLASTY Right 10/28/2014   Procedure: REVERSE SHOULDER ARTHROPLASTY;  Surgeon: Corky Mull, MD;  Location: [facility] ORS;  Service: Orthopedics;  Laterality: Right;    Prior to Admission medications   Medication Sig Start Date End Date Taking? Authorizing Provider  amLODipine (NORVASC) 5 MG tablet Take 5 mg by mouth at bedtime.    Historical Provider, MD  diphenhydrAMINE (BENADRYL) 50 MG tablet Take 50 mg by mouth at bedtime as needed for itching.    Historical Provider, MD  docusate sodium (COLACE) 100 MG capsule Take 100 mg by mouth daily as needed for mild constipation.    Historical Provider, MD  hydrALAZINE (APRESOLINE) 25 MG tablet Take by mouth. 05/26/15  05/25/16  Historical Provider, MD  hydrochlorothiazide (HYDRODIURIL) 25 MG tablet Take 25 mg by mouth daily.    Historical Provider, MD  HYDROcodone-acetaminophen (NORCO/VICODIN) 5-325 MG tablet Take 1 tablet by mouth 3 (three) times daily as needed for moderate pain. 10/30/15   Lequita Asal, MD  HYDROcodone-acetaminophen (NORCO/VICODIN) 5-325 MG tablet Take 1-2 tablets by mouth every 4 (four) hours as needed for moderate pain or severe pain. 11/08/15   Lisa Roca, MD  lisinopril (PRINIVIL,ZESTRIL) 40 MG tablet Take 40 mg by mouth every morning.    Historical Provider, MD  traMADol-acetaminophen (ULTRACET) 37.5-325 MG per tablet Take 1 tablet by mouth every 8 (eight) hours as needed.    Historical Provider, MD    No Known Allergies  No family history on file.  Social History Social History  Substance Use Topics  . Smoking status: Former Smoker    Packs/day: 2.00    Types: Cigarettes  . Smokeless tobacco: Never Used  . Alcohol use 0.6 oz/week    1 Glasses of wine per week     Comment: 1 glass of wine daily    Review of Systems  Constitutional: Negative for Recent illness. Eyes: Negative for visual changes. ENT: Negative for sore throat. Cardiovascular: Negative for anterior chest pain, point tenderness along the rib margin at the left posterior chest wall. Respiratory: Negative for shortness of breath, but taking a deep breath due to the posterior  chest wall pain. Gastrointestinal: Negative for abdominal pain, vomiting and diarrhea. Genitourinary: Negative for dysuria. Musculoskeletal: Negative for back pain. Skin: Negative for rash. Neurological: Negative for headache. 10 point Review of Systems otherwise negative ____________________________________________   PHYSICAL EXAM:  VITAL SIGNS: ED Triage Vitals  Enc Vitals Group     BP 11/08/15 1129 (!) 184/60     Pulse Rate 11/08/15 1129 90     Resp 11/08/15 1129 16     Temp 11/08/15 1129 98.3 F (36.8 C)     Temp  Source 11/08/15 1129 Oral     SpO2 11/08/15 1129 97 %     Weight 11/08/15 1135 112 lb (50.8 kg)     Height 11/08/15 1135 5' (1.524 m)     Head Circumference --      Peak Flow --      Pain Score 11/08/15 1234 7     Pain Loc --      Pain Edu? --      Excl. in [place]? --      Constitutional: Alert and oriented. Well appearing and in no distress. HEENT   Head: Normocephalic and atraumatic.      Eyes: Conjunctivae are normal. PERRL. Normal extraocular movements.      Ears:         Nose: No congestion/rhinnorhea.   Mouth/Throat: Mucous membranes are moist.   Neck: No stridor.  Nontender cervical spine palpation and range of motion. Cardiovascular/Chest: Normal rate, regular rhythm.  No murmurs, rubs, or gallops.  Posteriorly on the left chest wall at the rib margin she has an ecchymosis which is point tender to palpation and slightly swollen. Respiratory: Normal respiratory effort without tachypnea nor retractions. Breath sounds are clear and equal bilaterally. No wheezes/rales/rhonchi. Gastrointestinal: Soft. No distention, no guarding, no rebound. Nontender.    Genitourinary/rectal:Deferred Musculoskeletal: Nontender with normal range of motion in all extremities. No joint effusions.  No lower extremity tenderness.  No edema. Neurologic:  Normal speech and language. No gross or focal neurologic deficits are appreciated. Skin:  Skin is warm, dry and intact. No rash noted. Psychiatric: Mood and affect are normal. Speech and behavior are normal. Patient exhibits appropriate insight and judgment.  ____________________________________________   EKG I, Lisa Roca, MD, the attending physician have personally viewed and interpreted all ECGs.  87 bpm. Normal sinus rhythm. Narrow QRS. Normal axis. Nonspecific ST and T-wave ____________________________________________  LABS (pertinent positives/negatives)  Labs Reviewed - No data to  display  ____________________________________________  RADIOLOGY All Xrays were viewed by me. Imaging interpreted by Radiologist.  Ribs with chest left: Left posterior lateral ninth rib fracture. No pneumothorax or hemothorax __________________________________________  PROCEDURES  Procedure(s) performed: None  Critical Care performed: None  ____________________________________________   ED COURSE / ASSESSMENT AND PLAN  Pertinent labs & imaging results that were available during my care of the patient were reviewed by me and considered in my medical decision making (see chart for details).   Ms. Colicchio fell onto her night stand and has a left posterior rib fracture. After hydrocodone she is able to take a deep breath, and with hypoxia. She has an incentive spirometer at home after history of rib fractures in the past. This was essentially a mechanical-type fall after she experienced like spasm which is somewhat common for her.  No additional traumatic injury, certainly no injury to the head back or extremities or abdomen.  I think she is stable to go home. She does take hydrocodone normally but is down to  4 tablets, and I will prescribe her 15 tablets until she can get back in with her primary care physician and/or oncologist.    CONSULTATIONS:   None   Patient / Family / Caregiver informed of clinical course, medical decision-making process, and agree with plan.   I discussed return precautions, follow-up instructions, and discharged instructions with patient and/or family.   ___________________________________________   FINAL CLINICAL IMPRESSION(S) / ED DIAGNOSES   Final diagnoses:  Rib fracture, left, closed, initial encounter              Note: This dictation was prepared with Dragon dictation. Any transcriptional errors that result from this process are unintentional    Lisa Roca, MD 11/08/15 1420

## 2015-11-08 NOTE — ED Notes (Signed)
Patient transported to X-ray 

## 2015-11-08 NOTE — ED Triage Notes (Signed)
Pt came to ED via EMS from [facility]. Pt reports loosing her balance and falling last night. Reports left sided rib pain.

## 2015-11-08 NOTE — Discharge Instructions (Signed)
You were evaluated after a fall and found to have left posterior ninth rib fracture.  As we discussed, use your insulin incentive spirometer to make sure you are deep breathing to prevent pneumonia.  Return to the emergency department for any worsening pain, trouble breathing, dizziness or passing out, or any other symptoms concerning to you.

## 2015-11-16 DIAGNOSIS — D46C Myelodysplastic syndrome with isolated del(5q) chromosomal abnormality: Secondary | ICD-10-CM

## 2015-11-16 DIAGNOSIS — D469 Myelodysplastic syndrome, unspecified: Secondary | ICD-10-CM | POA: Insufficient documentation

## 2015-11-16 DIAGNOSIS — C9110 Chronic lymphocytic leukemia of B-cell type not having achieved remission: Secondary | ICD-10-CM | POA: Insufficient documentation

## 2015-11-16 NOTE — Progress Notes (Signed)
[place]  Telephone:([phone] Fax:(336) [phone]  ID: Chaney Born OB: [birth year]  MR#: [id number]  UKG#:[id number]  Patient Care Team: Glendon Axe, MD as PCP - General (Internal Medicine)  CHIEF COMPLAINT: MDS 5q-, CLL  INTERVAL HISTORY: Patient returns to clinic today as an add-on inquiring on whether or not to continue to pursue B-12 injections prescribed. She currently feels well and is asymptomatic. She does not complain of weakness or fatigue today.  She has no neurologic complaints. She denies any recent fevers or illnesses. She denies any night sweats or weight loss. She has no chest pain or shortness of breath. She denies any fevers or night sweats. She denies any nausea, vomiting, constipation, or diarrhea.  Patient offers no specific complaints today.   REVIEW OF SYSTEMS:   Review of Systems  Constitutional: Negative for fever and weight loss.  Respiratory: Negative.  Negative for shortness of breath.   Cardiovascular: Negative.  Negative for chest pain.  Gastrointestinal: Negative.  Negative for abdominal pain, blood in stool and melena.  Genitourinary: Negative.   Musculoskeletal: Negative.  Negative for joint pain.  Neurological: Negative.   Psychiatric/Behavioral: Negative.     As per HPI. Otherwise, a complete review of systems is negatve.  PAST MEDICAL HISTORY: Past Medical History:  Diagnosis Date  . Anemia    Myelodysplastic syndrome  . Arthritis   . Hypertension   . Leukemia, chronic lymphoid (HCC)    CLL  . Ovarian cancer ([place]) 1963    PAST SURGICAL HISTORY: Past Surgical History:  Procedure Laterality Date  . ABDOMINAL HYSTERECTOMY    . APPENDECTOMY    . BREAST SURGERY     cyst removed from right breast  . EYE SURGERY Bilateral    Cataract Extractions  . REVERSE SHOULDER ARTHROPLASTY Right 10/28/2014   Procedure: REVERSE SHOULDER ARTHROPLASTY;  Surgeon: Corky Mull, MD;  Location: [facility] ORS;  Service: Orthopedics;   Laterality: Right;    FAMILY HISTORY: Reviewed and unchanged. No reported history of malignancy or chronic disease.     ADVANCED DIRECTIVES:    HEALTH MAINTENANCE: Social History  Substance Use Topics  . Smoking status: Former Smoker    Packs/day: 2.00    Types: Cigarettes  . Smokeless tobacco: Never Used  . Alcohol use 0.6 oz/week    1 Glasses of wine per week     Comment: 1 glass of wine daily     Colonoscopy:  PAP:  Bone density:  Lipid panel:  No Known Allergies  Current Outpatient Prescriptions  Medication Sig Dispense Refill  . amLODipine (NORVASC) 5 MG tablet Take 5 mg by mouth at bedtime.    Marland Kitchen aspirin EC 81 MG tablet Take by mouth.    . diphenhydrAMINE (BENADRYL) 50 MG tablet Take 50 mg by mouth at bedtime as needed for itching.    . hydrALAZINE (APRESOLINE) 25 MG tablet Take by mouth.    . hydrochlorothiazide (HYDRODIURIL) 25 MG tablet Take 25 mg by mouth daily.    Marland Kitchen HYDROcodone-acetaminophen (NORCO/VICODIN) 5-325 MG tablet Take 1-2 tablets by mouth every 4 (four) hours as needed for moderate pain or severe pain. 15 tablet 0  . lisinopril (PRINIVIL,ZESTRIL) 40 MG tablet Take 40 mg by mouth every morning.    . traMADol-acetaminophen (ULTRACET) 37.5-325 MG per tablet Take 1 tablet by mouth every 8 (eight) hours as needed.     No current facility-administered medications for this visit.     OBJECTIVE: Vitals:   11/17/15 1433  BP: (!) 186/77  Pulse: 86  Resp: 17  Temp: 97.7 F (36.5 C)     Body mass index is 22.35 kg/m.    ECOG FS:0 - Asymptomatic  General: Well-developed, well-nourished, no acute distress. Eyes: Pink conjunctiva, anicteric sclera. Lungs: Clear to auscultation bilaterally. Heart: Regular rate and rhythm. No rubs, murmurs, or gallops. Abdomen: Soft, nontender, nondistended. No organomegaly noted, normoactive bowel sounds. Musculoskeletal: No edema, cyanosis, or clubbing. Neuro: Alert, answering all questions appropriately. Cranial nerves  grossly intact. Skin: No rashes or petechiae noted. Psych: Normal affect. Lymphatics: No cervical, calvicular, axillary or inguinal LAD.   LAB RESULTS:  Lab Results  Component Value Date   NA 136 06/12/2015   K 3.6 06/12/2015   CL 104 06/12/2015   CO2 25 06/12/2015   GLUCOSE 158 (H) 06/12/2015   BUN 21 (H) 06/12/2015   CREATININE 0.90 06/12/2015   CALCIUM 9.7 06/12/2015   PROT 7.3 03/27/2013   ALBUMIN 4.1 03/27/2013   AST 12 (L) 03/27/2013   ALT 20 03/27/2013   ALKPHOS 87 03/27/2013   BILITOT 0.5 03/27/2013   GFRNONAA 58 (L) 06/12/2015   GFRAA >60 06/12/2015    Lab Results  Component Value Date   WBC 3.4 (L) 06/12/2015   NEUTROABS 1.9 06/12/2015   HGB 9.1 (L) 06/12/2015   HCT 26.3 (L) 06/12/2015   MCV 111.6 (H) 06/12/2015   PLT 281 06/12/2015     STUDIES: Dg Ribs Unilateral W/chest Left  Result Date: 11/08/2015 CLINICAL DATA:  [place] yesterday, striking night stand. Lateral rib pain. EXAM: LEFT RIBS AND CHEST - 3+ VIEW COMPARISON:  10/27/2012 FINDINGS: Mild cardiomegaly. Aortic atherosclerosis. Prominent chronic interstitial lung markings. No sign of active infiltrate. No pneumothorax or hemothorax. Left rib films show an acute fracture of the ninth rib posterior laterally. IMPRESSION: Left posterior lateral ninth rib fracture. No pneumothorax or hemothorax. Electronically Signed   By: Nelson Chimes M.D.   On: 11/08/2015 13:25    ASSESSMENT: MDS 5q-, CLL   PLAN:    1.  CLL:  CT scan results from June 09, 2014 were independently reviewed and did not reveal any evidence of recurrent disease. All of her labwork is also either negative or within normal limits. Patient last received single agent Rituxan in February 2015. No treatment is needed at this time. Patient expressed understanding that she will may need to reinitiate treatment at some point in the future.  She has been instructed to keep her appointments as previously scheduled.   2.  Anemia:  Hemoglobin continues  to trend down. Patient was also noted to have a decreased B-12 level at outside office and has been instructed to continue her B-12 injections as prescribed. Check iron stores at next clinic visit.  3.  Thrombocytopenia:  Platelet count is now within normal limits. 4.  MDS, 5q-:  Continue to monitor, no bone marrow biopsy is not needed at this time, plus patient refuses one anyway.  Patient continues to refuse treatment with Revlimid.  Approximately 30 minutes was spent in discussion of which greater than 50% was consultation.  Patient expressed understanding and was in agreement with this plan. She also understands that She can call clinic at any time with any questions, concerns, or complaints.   Lloyd Huger, MD   11/17/2015 11:02 PM

## 2015-11-17 ENCOUNTER — Inpatient Hospital Stay: Payer: Medicare Other | Attending: Oncology | Admitting: Oncology

## 2015-11-17 ENCOUNTER — Encounter: Payer: Self-pay | Admitting: Oncology

## 2015-11-17 VITALS — BP 186/77 | HR 86 | Temp 97.7°F | Resp 17 | Ht 60.0 in | Wt 114.4 lb

## 2015-11-17 DIAGNOSIS — Z79899 Other long term (current) drug therapy: Secondary | ICD-10-CM | POA: Insufficient documentation

## 2015-11-17 DIAGNOSIS — D469 Myelodysplastic syndrome, unspecified: Secondary | ICD-10-CM | POA: Diagnosis not present

## 2015-11-17 DIAGNOSIS — Z8543 Personal history of malignant neoplasm of ovary: Secondary | ICD-10-CM | POA: Diagnosis not present

## 2015-11-17 DIAGNOSIS — I10 Essential (primary) hypertension: Secondary | ICD-10-CM | POA: Diagnosis not present

## 2015-11-17 DIAGNOSIS — C9110 Chronic lymphocytic leukemia of B-cell type not having achieved remission: Secondary | ICD-10-CM

## 2015-11-17 DIAGNOSIS — D696 Thrombocytopenia, unspecified: Secondary | ICD-10-CM | POA: Insufficient documentation

## 2015-11-17 DIAGNOSIS — D649 Anemia, unspecified: Secondary | ICD-10-CM | POA: Diagnosis not present

## 2015-11-17 DIAGNOSIS — Z87891 Personal history of nicotine dependence: Secondary | ICD-10-CM | POA: Insufficient documentation

## 2015-11-17 DIAGNOSIS — Z7982 Long term (current) use of aspirin: Secondary | ICD-10-CM | POA: Diagnosis not present

## 2015-11-17 DIAGNOSIS — C9111 Chronic lymphocytic leukemia of B-cell type in remission: Secondary | ICD-10-CM | POA: Insufficient documentation

## 2015-11-17 DIAGNOSIS — M129 Arthropathy, unspecified: Secondary | ICD-10-CM | POA: Insufficient documentation

## 2015-11-17 DIAGNOSIS — E538 Deficiency of other specified B group vitamins: Secondary | ICD-10-CM

## 2015-11-17 DIAGNOSIS — D46C Myelodysplastic syndrome with isolated del(5q) chromosomal abnormality: Secondary | ICD-10-CM

## 2015-12-02 ENCOUNTER — Encounter (INDEPENDENT_AMBULATORY_CARE_PROVIDER_SITE_OTHER): Payer: Self-pay | Admitting: Vascular Surgery

## 2015-12-09 ENCOUNTER — Other Ambulatory Visit: Payer: Self-pay | Admitting: *Deleted

## 2015-12-09 MED ORDER — HYDROCODONE-ACETAMINOPHEN 5-325 MG PO TABS: 1.0000 | ORAL_TABLET | ORAL | 0 refills | Status: DC | PRN

## 2015-12-16 NOTE — Progress Notes (Signed)
[place]  Telephone:(336) [phone] Fax:(336) [phone]  ID: Jacqueline Russo OB: October 19, 1929  MR#: [id number]  EVO#:[id number]  Patient Care Team: Glendon Axe, MD as PCP - General (Internal Medicine)  CHIEF COMPLAINT: MDS 5q-, CLL  INTERVAL HISTORY: Patient returns to clinic today for her routine follow-up. She recently initiated B-12 injections and has noted no difference in her persistent weakness and fatigue. She otherwise feels well and is asymptomatic. She has no neurologic complaints. She denies any recent fevers or illnesses. She denies any night sweats or weight loss. She has no chest pain or shortness of breath. She denies any nausea, vomiting, constipation, or diarrhea. She has no urinary complaints. Patient offers no specific complaints today.   REVIEW OF SYSTEMS:   Review of Systems  Constitutional: Negative for fever and weight loss.  Respiratory: Negative.  Negative for shortness of breath.   Cardiovascular: Negative.  Negative for chest pain.  Gastrointestinal: Negative.  Negative for abdominal pain, blood in stool and melena.  Genitourinary: Negative.   Musculoskeletal: Negative.  Negative for joint pain.  Neurological: Negative.   Psychiatric/Behavioral: Negative.     As per HPI. Otherwise, a complete review of systems is negative.  PAST MEDICAL HISTORY: Past Medical History:  Diagnosis Date  . Anemia    Myelodysplastic syndrome  . Arthritis   . Hypertension   . Leukemia, chronic lymphoid (HCC)    CLL  . Ovarian cancer ([place]) 1963    PAST SURGICAL HISTORY: Past Surgical History:  Procedure Laterality Date  . ABDOMINAL HYSTERECTOMY    . APPENDECTOMY    . BREAST SURGERY     cyst removed from right breast  . EYE SURGERY Bilateral    Cataract Extractions  . REVERSE SHOULDER ARTHROPLASTY Right 10/28/2014   Procedure: REVERSE SHOULDER ARTHROPLASTY;  Surgeon: Corky Mull, MD;  Location: [facility] ORS;  Service: Orthopedics;  Laterality: Right;     FAMILY HISTORY: Reviewed and unchanged. No reported history of malignancy or chronic disease.     ADVANCED DIRECTIVES:    HEALTH MAINTENANCE: Social History  Substance Use Topics  . Smoking status: Former Smoker    Packs/day: 2.00    Types: Cigarettes  . Smokeless tobacco: Never Used  . Alcohol use 0.6 oz/week    1 Glasses of wine per week     Comment: 1 glass of wine daily     Colonoscopy:  PAP:  Bone density:  Lipid panel:  No Known Allergies  Current Outpatient Prescriptions  Medication Sig Dispense Refill  . amLODipine (NORVASC) 5 MG tablet Take 5 mg by mouth at bedtime.    . diphenhydrAMINE (BENADRYL) 50 MG tablet Take 50 mg by mouth at bedtime as needed for itching.    . hydrALAZINE (APRESOLINE) 25 MG tablet Take by mouth.    . hydrochlorothiazide (HYDRODIURIL) 25 MG tablet Take 25 mg by mouth daily.    Marland Kitchen HYDROcodone-acetaminophen (NORCO/VICODIN) 5-325 MG tablet Take 1-2 tablets by mouth every 4 (four) hours as needed for moderate pain or severe pain. 30 tablet 0  . lisinopril (PRINIVIL,ZESTRIL) 40 MG tablet Take 40 mg by mouth every morning.    . traMADol-acetaminophen (ULTRACET) 37.5-325 MG per tablet Take 1 tablet by mouth every 8 (eight) hours as needed.    Marland Kitchen aspirin EC 81 MG tablet Take by mouth.     No current facility-administered medications for this visit.     OBJECTIVE: Vitals:   12/18/15 0929  BP: (!) 156/76  Pulse: 83  Temp: 99 F (37.2  C)     Body mass index is 21.76 kg/m.    ECOG FS:0 - Asymptomatic  General: Well-developed, well-nourished, no acute distress. Eyes: Pink conjunctiva, anicteric sclera. Lungs: Clear to auscultation bilaterally. Heart: Regular rate and rhythm. No rubs, murmurs, or gallops. Abdomen: Soft, nontender, nondistended. No organomegaly noted, normoactive bowel sounds. Musculoskeletal: No edema, cyanosis, or clubbing. Neuro: Alert, answering all questions appropriately. Cranial nerves grossly intact. Skin: No  rashes or petechiae noted. Psych: Normal affect. Lymphatics: No cervical, calvicular, axillary or inguinal LAD.   LAB RESULTS:  Lab Results  Component Value Date   NA 134 (L) 12/18/2015   K 3.4 (L) 12/18/2015   CL 100 (L) 12/18/2015   CO2 24 12/18/2015   GLUCOSE 135 (H) 12/18/2015   BUN 18 12/18/2015   CREATININE 0.93 12/18/2015   CALCIUM 9.8 12/18/2015   PROT 7.3 03/27/2013   ALBUMIN 4.1 03/27/2013   AST 12 (L) 03/27/2013   ALT 20 03/27/2013   ALKPHOS 87 03/27/2013   BILITOT 0.5 03/27/2013   GFRNONAA 55 (L) 12/18/2015   GFRAA >60 12/18/2015    Lab Results  Component Value Date   WBC 4.2 12/18/2015   NEUTROABS 2.3 12/18/2015   HGB 8.7 (L) 12/18/2015   HCT 25.4 (L) 12/18/2015   MCV 113.8 (H) 12/18/2015   PLT 337 12/18/2015   Lab Results  Component Value Date   IRON 193 (H) 12/18/2015   TIBC 380 12/18/2015   IRONPCTSAT 51 (H) 12/18/2015   Lab Results  Component Value Date   FERRITIN 261 12/18/2015     STUDIES: No results found.  ASSESSMENT: MDS 5q-, CLL   PLAN:    1.  CLL:  CT scan results from June 09, 2014 were independently reviewed and did not reveal any evidence of recurrent disease. All of her labwork is also either negative or within normal limits. Patient last received single agent Rituxan in February 2015. No treatment is needed at this time. Patient expressed understanding that she will may need to reinitiate treatment at some point in the future.  Return to clinic in 6 months for repeat laboratory work and routine evaluation.  2.  Anemia:  Hemoglobin decreased, but essentially stable. Iron stores are slightly elevated. Continue B-12 injections as ordered.  3.  Thrombocytopenia:  Platelet count is now within normal limits. 4.  MDS, 5q-:  Continue to monitor, no bone marrow biopsy is not needed at this time, plus patient refuses one anyway.  Patient continues to refuse treatment with Revlimid.   Patient expressed understanding and was in agreement  with this plan. She also understands that She can call clinic at any time with any questions, concerns, or complaints.   Lloyd Huger, MD   12/18/2015 12:35 PM

## 2015-12-18 ENCOUNTER — Inpatient Hospital Stay (HOSPITAL_BASED_OUTPATIENT_CLINIC_OR_DEPARTMENT_OTHER): Payer: Medicare Other | Admitting: Oncology

## 2015-12-18 ENCOUNTER — Inpatient Hospital Stay: Payer: Medicare Other | Attending: Oncology

## 2015-12-18 VITALS — BP 156/76 | HR 83 | Temp 99.0°F | Wt 111.4 lb

## 2015-12-18 DIAGNOSIS — I10 Essential (primary) hypertension: Secondary | ICD-10-CM

## 2015-12-18 DIAGNOSIS — Z7982 Long term (current) use of aspirin: Secondary | ICD-10-CM | POA: Diagnosis not present

## 2015-12-18 DIAGNOSIS — C9110 Chronic lymphocytic leukemia of B-cell type not having achieved remission: Secondary | ICD-10-CM

## 2015-12-18 DIAGNOSIS — R5383 Other fatigue: Secondary | ICD-10-CM | POA: Insufficient documentation

## 2015-12-18 DIAGNOSIS — Z8543 Personal history of malignant neoplasm of ovary: Secondary | ICD-10-CM | POA: Insufficient documentation

## 2015-12-18 DIAGNOSIS — M129 Arthropathy, unspecified: Secondary | ICD-10-CM | POA: Insufficient documentation

## 2015-12-18 DIAGNOSIS — E538 Deficiency of other specified B group vitamins: Secondary | ICD-10-CM

## 2015-12-18 DIAGNOSIS — D46C Myelodysplastic syndrome with isolated del(5q) chromosomal abnormality: Secondary | ICD-10-CM

## 2015-12-18 DIAGNOSIS — D649 Anemia, unspecified: Secondary | ICD-10-CM | POA: Insufficient documentation

## 2015-12-18 DIAGNOSIS — R531 Weakness: Secondary | ICD-10-CM | POA: Diagnosis not present

## 2015-12-18 DIAGNOSIS — Z87891 Personal history of nicotine dependence: Secondary | ICD-10-CM

## 2015-12-18 DIAGNOSIS — C9111 Chronic lymphocytic leukemia of B-cell type in remission: Secondary | ICD-10-CM | POA: Diagnosis present

## 2015-12-18 DIAGNOSIS — Z79899 Other long term (current) drug therapy: Secondary | ICD-10-CM

## 2015-12-18 LAB — CBC WITH DIFFERENTIAL/PLATELET
Basophils Absolute: 0.1 10*3/uL (ref 0–0.1)
Basophils Relative: 3 %
Eosinophils Absolute: 0.2 10*3/uL (ref 0–0.7)
Eosinophils Relative: 4 %
HCT: 25.4 % — ABNORMAL LOW (ref 35.0–47.0)
Hemoglobin: 8.7 g/dL — ABNORMAL LOW (ref 12.0–16.0)
Lymphocytes Relative: 27 %
Lymphs Abs: 1.1 10*3/uL (ref 1.0–3.6)
MCH: 38.8 pg — ABNORMAL HIGH (ref 26.0–34.0)
MCHC: 34.1 g/dL (ref 32.0–36.0)
MCV: 113.8 fL — ABNORMAL HIGH (ref 80.0–100.0)
Monocytes Absolute: 0.4 10*3/uL (ref 0.2–0.9)
Monocytes Relative: 10 %
Neutro Abs: 2.3 10*3/uL (ref 1.4–6.5)
Neutrophils Relative %: 56 %
Platelets: 337 10*3/uL (ref 150–440)
RBC: 2.23 MIL/uL — ABNORMAL LOW (ref 3.80–5.20)
RDW: 17.3 % — ABNORMAL HIGH (ref 11.5–14.5)
WBC: 4.2 10*3/uL (ref 3.6–11.0)

## 2015-12-18 LAB — BASIC METABOLIC PANEL
Anion gap: 10 (ref 5–15)
BUN: 18 mg/dL (ref 6–20)
CO2: 24 mmol/L (ref 22–32)
Calcium: 9.8 mg/dL (ref 8.9–10.3)
Chloride: 100 mmol/L — ABNORMAL LOW (ref 101–111)
Creatinine, Ser: 0.93 mg/dL (ref 0.44–1.00)
GFR calc Af Amer: >60 mL/min (ref >60)
GFR calc non Af Amer: 55 mL/min — ABNORMAL LOW (ref >60)
Glucose, Bld: 135 mg/dL — ABNORMAL HIGH (ref 65–99)
Potassium: 3.4 mmol/L — ABNORMAL LOW (ref 3.5–5.1)
Sodium: 134 mmol/L — ABNORMAL LOW (ref 135–145)

## 2015-12-18 LAB — FERRITIN: Ferritin: 261 ng/mL (ref 11–307)

## 2015-12-18 LAB — IRON AND TIBC
Iron: 193 ug/dL — ABNORMAL HIGH (ref 28–170)
Saturation Ratios: 51 % — ABNORMAL HIGH (ref 10.4–31.8)
TIBC: 380 ug/dL (ref 250–450)
UIBC: 187 ug/dL

## 2015-12-18 LAB — VITAMIN B12: Vitamin B-12: 223 pg/mL (ref 180–914)

## 2015-12-18 LAB — LACTATE DEHYDROGENASE: LDH: 119 U/L (ref 98–192)

## 2015-12-18 NOTE — Progress Notes (Signed)
Patient ambulates without assistance, brought to exam room 2.  Patient denies pain or discomfort at this time.  BP 156/76, vitals documented

## 2015-12-25 ENCOUNTER — Other Ambulatory Visit: Payer: Self-pay | Admitting: *Deleted

## 2015-12-25 MED ORDER — HYDROCODONE-ACETAMINOPHEN 5-325 MG PO TABS: 1.0000 | ORAL_TABLET | ORAL | 0 refills | Status: DC | PRN

## 2015-12-28 ENCOUNTER — Encounter (INDEPENDENT_AMBULATORY_CARE_PROVIDER_SITE_OTHER): Payer: Medicare Other

## 2015-12-28 ENCOUNTER — Ambulatory Visit (INDEPENDENT_AMBULATORY_CARE_PROVIDER_SITE_OTHER): Payer: Self-pay | Admitting: Vascular Surgery

## 2016-01-26 ENCOUNTER — Other Ambulatory Visit: Payer: Self-pay | Admitting: *Deleted

## 2016-01-26 MED ORDER — HYDROCODONE-ACETAMINOPHEN 5-325 MG PO TABS: 1.0000 | ORAL_TABLET | ORAL | 0 refills | Status: DC | PRN

## 2016-02-03 ENCOUNTER — Other Ambulatory Visit (INDEPENDENT_AMBULATORY_CARE_PROVIDER_SITE_OTHER): Payer: Self-pay | Admitting: Vascular Surgery

## 2016-02-03 DIAGNOSIS — I6523 Occlusion and stenosis of bilateral carotid arteries: Secondary | ICD-10-CM

## 2016-02-04 ENCOUNTER — Encounter (INDEPENDENT_AMBULATORY_CARE_PROVIDER_SITE_OTHER): Payer: Self-pay | Admitting: Vascular Surgery

## 2016-02-04 ENCOUNTER — Ambulatory Visit (INDEPENDENT_AMBULATORY_CARE_PROVIDER_SITE_OTHER): Payer: Medicare Other

## 2016-02-04 ENCOUNTER — Ambulatory Visit (INDEPENDENT_AMBULATORY_CARE_PROVIDER_SITE_OTHER): Payer: Medicare Other | Admitting: Vascular Surgery

## 2016-02-04 ENCOUNTER — Encounter (INDEPENDENT_AMBULATORY_CARE_PROVIDER_SITE_OTHER): Payer: Self-pay

## 2016-02-04 VITALS — BP 202/82 | HR 91 | Resp 16 | Ht 60.0 in | Wt 111.0 lb

## 2016-02-04 DIAGNOSIS — E119 Type 2 diabetes mellitus without complications: Secondary | ICD-10-CM

## 2016-02-04 DIAGNOSIS — I6523 Occlusion and stenosis of bilateral carotid arteries: Secondary | ICD-10-CM

## 2016-02-04 DIAGNOSIS — E785 Hyperlipidemia, unspecified: Secondary | ICD-10-CM | POA: Diagnosis not present

## 2016-02-07 NOTE — Progress Notes (Signed)
MRN : [id number]  Jacqueline Russo is a 82 y.o. (1930/01/04) female who presents with chief complaint of  Chief Complaint  Patient presents with  . Carotid    Ultrasound follow up  .  History of Present Illness: The patient is seen for follow up evaluation of carotid stenosis. The carotid stenosis followed by ultrasound.   The patient denies amaurosis fugax. There is no recent history of TIA symptoms or focal motor deficits. There is no prior documented CVA.  The patient is taking enteric-coated aspirin 81 mg daily.  There is no history of migraine headaches. There is no history of seizures.  The patient has a history of coronary artery disease, no recent episodes of angina or shortness of breath. The patient denies PAD or claudication symptoms. There is a history of hyperlipidemia which is being treated with a statin.    Carotid Duplex done today shows >50% stenosis.  No change compared to last study.  Current Meds  Medication Sig  . amLODipine (NORVASC) 5 MG tablet Take 5 mg by mouth at bedtime.  Marland Kitchen aspirin EC 81 MG tablet Take by mouth.  . diphenhydrAMINE (BENADRYL) 50 MG tablet Take 50 mg by mouth at bedtime as needed for itching.  . hydrALAZINE (APRESOLINE) 25 MG tablet Take by mouth.  . hydrochlorothiazide (HYDRODIURIL) 25 MG tablet Take 25 mg by mouth daily.  Marland Kitchen HYDROcodone-acetaminophen (NORCO/VICODIN) 5-325 MG tablet Take 1-2 tablets by mouth every 4 (four) hours as needed for moderate pain or severe pain.  Marland Kitchen lisinopril (PRINIVIL,ZESTRIL) 40 MG tablet Take 40 mg by mouth every morning.  . traMADol-acetaminophen (ULTRACET) 37.5-325 MG per tablet Take 1 tablet by mouth every 8 (eight) hours as needed.    Past Medical History:  Diagnosis Date  . Anemia    Myelodysplastic syndrome  . Arthritis   . Hypertension   . Leukemia, chronic lymphoid (HCC)    CLL  . Ovarian cancer ([place]) 1963    Past Surgical History:  Procedure Laterality Date  . ABDOMINAL HYSTERECTOMY    .  APPENDECTOMY    . BREAST SURGERY     cyst removed from right breast  . EYE SURGERY Bilateral    Cataract Extractions  . REVERSE SHOULDER ARTHROPLASTY Right 10/28/2014   Procedure: REVERSE SHOULDER ARTHROPLASTY;  Surgeon: Corky Mull, MD;  Location: [facility] ORS;  Service: Orthopedics;  Laterality: Right;    Social History Social History  Substance Use Topics  . Smoking status: Former Smoker    Packs/day: 2.00    Types: Cigarettes  . Smokeless tobacco: Never Used  . Alcohol use 0.6 oz/week    1 Glasses of wine per week     Comment: 1 glass of wine daily    Family History No family history on file. No family history of bleeding/clotting disorders, porphyria or autoimmune disease   No Known Allergies   REVIEW OF SYSTEMS (Negative unless checked)  Constitutional: [] Weight loss  [] Fever  [] Chills Cardiac: [] Chest pain   [] Chest pressure   [] Palpitations   [] Shortness of breath when laying flat   [] Shortness of breath with exertion. Vascular:  [] Pain in legs with walking   [] Pain in legs at rest  [] History of DVT   [] Phlebitis   [] Swelling in legs   [] Varicose veins   [] Non-healing ulcers Pulmonary:   [] Uses home oxygen   [] Productive cough   [] Hemoptysis   [] Wheeze  [] COPD   [] Asthma Neurologic:  [] Dizziness   [] Seizures   [] History of stroke   []   History of TIA  [] Aphasia   [] Vissual changes   [] Weakness or numbness in arm   [] Weakness or numbness in leg Musculoskeletal:   [] Joint swelling   [] Joint pain   [] Low back pain Hematologic:  [] Easy bruising  [] Easy bleeding   [] Hypercoagulable state   [] Anemic Gastrointestinal:  [] Diarrhea   [] Vomiting  [] Gastroesophageal reflux/heartburn   [] Difficulty swallowing. Genitourinary:  [] Chronic kidney disease   [] Difficult urination  [] Frequent urination   [] Blood in urine Skin:  [] Rashes   [] Ulcers  Psychological:  [] History of anxiety   []  History of major depression.  Physical Examination  Vitals:   02/04/16 1146  BP: (!) 202/82  Pulse:  91  Resp: 16  Weight: 111 lb (50.3 kg)  Height: 5' (1.524 m)   Body mass index is 21.68 kg/m. Gen: WD/WN, NAD Head: [place]/AT, No temporalis wasting.  Ear/Nose/Throat: Hearing grossly intact, nares w/o erythema or drainage, poor dentition Eyes: PER, EOMI, sclera nonicteric.  Neck: Supple, no masses.  No bruit or JVD.  Pulmonary:  Good air movement, clear to auscultation bilaterally, no use of accessory muscles.  Cardiac: RRR, normal S1, S2, no Murmurs. Vascular:  Vessel Right Left  Radial Palpable Palpable  Ulnar Palpable Palpable  Brachial Palpable Palpable  Carotid Palpable Palpable  Femoral Palpable Palpable  Popliteal Palpable Palpable  PT Palpable Palpable  DP Palpable Palpable   Gastrointestinal: soft, non-distended. No guarding/no peritoneal signs.  Musculoskeletal: M/S 5/5 throughout.  No deformity or atrophy.  Neurologic: CN 2-12 intact. Pain and light touch intact in extremities.  Symmetrical.  Speech is fluent. Motor exam as listed above. Psychiatric: Judgment intact, Mood & affect appropriate for pt's clinical situation. Dermatologic: No rashes or ulcers noted.  No changes consistent with cellulitis. Lymph : No Cervical lymphadenopathy, no lichenification or skin changes of chronic lymphedema.  CBC Lab Results  Component Value Date   WBC 4.2 12/18/2015   HGB 8.7 (L) 12/18/2015   HCT 25.4 (L) 12/18/2015   MCV 113.8 (H) 12/18/2015   PLT 337 12/18/2015    BMET    Component Value Date/Time   NA 134 (L) 12/18/2015 0859   NA 140 06/26/2013 1044   K 3.4 (L) 12/18/2015 0859   K 3.7 06/26/2013 1044   CL 100 (L) 12/18/2015 0859   CL 102 06/26/2013 1044   CO2 24 12/18/2015 0859   CO2 32 06/26/2013 1044   GLUCOSE 135 (H) 12/18/2015 0859   GLUCOSE 121 (H) 06/26/2013 1044   BUN 18 12/18/2015 0859   BUN 16 06/26/2013 1044   CREATININE 0.93 12/18/2015 0859   CREATININE 1.01 01/06/2014 0828   CALCIUM 9.8 12/18/2015 0859   CALCIUM 9.0 06/26/2013 1044   GFRNONAA 55  (L) 12/18/2015 0859   GFRNONAA 56 (L) 01/06/2014 0828   GFRNONAA >60 06/26/2013 1044   GFRAA >60 12/18/2015 0859   GFRAA >60 01/06/2014 0828   GFRAA >60 06/26/2013 1044   CrCl cannot be calculated (Patient's most recent lab result is older than the maximum 21 days allowed.).  COAG Lab Results  Component Value Date   INR 1.02 10/15/2014   INR 1.1 10/21/2012    Radiology No results found.   Assessment/Plan 1. Bilateral carotid artery stenosis Recommend:  Given the patient's asymptomatic subcritical stenosis no further invasive testing or surgery at this time.  Duplex ultrasound shows stable >60% stenosis bilaterally.  Continue antiplatelet therapy as prescribed Continue management of CAD, HTN and Hyperlipidemia Healthy heart diet,  encouraged exercise at least 4 times per week  Follow up in 6 months with duplex ultrasound and physical exam based on >50% stenosis of the bilateral carotid arteries   - VAS US CAROTID; Future  2. Diet-controlled type 2 diabetes mellitus ([place]) Continue hypoglycemic medications as already ordered and reviewed, no changes at this time.   3. Hyperlipidemia, unspecified hyperlipidemia type Continue statin as ordered and reviewed, no changes at this time  Hortencia Pilar, MD  02/07/2016 3:27 PM

## 2016-02-08 DIAGNOSIS — I6523 Occlusion and stenosis of bilateral carotid arteries: Principal | ICD-10-CM

## 2016-02-08 NOTE — Progress Notes (Signed)
Subjective:    Patient ID: Jacqueline Russo, female    DOB: 24-Dec-1929, 82 y.o.   MRN: [id number] Chief Complaint  Patient presents with  . Carotid    Ultrasound follow up   Patient presents for a non-invasive study follow up for carotid stenosis. The stenosis has been followed by surveillance duplexes. The patient underwent a bilateral carotid duplex scan which showed no change from the previous exam duplex is stable at 60-79% ICA stenosis bilaterally. The patient denies experiencing Amaurosis Fugax, TIA like symptoms or focal motor deficits.    Review of Systems  Constitutional: Negative.   HENT: Negative.   Eyes: Negative.   Respiratory: Negative.   Cardiovascular: Negative.   Gastrointestinal: Negative.   Endocrine: Negative.   Genitourinary: Negative.   Musculoskeletal: Negative.   Skin: Negative.   Allergic/Immunologic: Negative.   Neurological: Negative.   Hematological: Negative.   Psychiatric/Behavioral: Negative.        Objective:   Physical Exam  Constitutional: She is oriented to person, place, and time. She appears well-developed and well-nourished.  HENT:  Head: Normocephalic and atraumatic.  Right Ear: External ear normal.  Left Ear: External ear normal.  Eyes: Conjunctivae and EOM are normal. Pupils are equal, round, and reactive to light.  Neck: Normal range of motion.  Cardiovascular: Normal rate, regular rhythm, normal heart sounds and intact distal pulses.   Pulses:      Radial pulses are 2+ on the right side, and 2+ on the left side.       Dorsalis pedis pulses are 2+ on the right side, and 2+ on the left side.       Posterior tibial pulses are 2+ on the right side, and 2+ on the left side.  Bilateral Carotid Bruits Noted.  Pulmonary/Chest: Effort normal and breath sounds normal.  Abdominal: Soft. Bowel sounds are normal.  Musculoskeletal: Normal range of motion. She exhibits no edema.  Neurological: She is alert and oriented to person, place, and  time.  Skin: Skin is warm and dry.  Psychiatric: She has a normal mood and affect. Her behavior is normal. Judgment and thought content normal.   BP (!) 202/82 (BP Location: Right Arm)   Pulse 91   Resp 16   Ht 5' (1.524 m)   Wt 111 lb (50.3 kg)   BMI 21.68 kg/m   Past Medical History:  Diagnosis Date  . Anemia    Myelodysplastic syndrome  . Arthritis   . Hypertension   . Leukemia, chronic lymphoid (HCC)    CLL  . Ovarian cancer [facility]) 1963   Social History   Social History  . Marital status: Widowed    Spouse name: N/A  . Number of children: N/A  . Years of education: N/A   Occupational History  . Not on file.   Social History Main Topics  . Smoking status: Former Smoker    Packs/day: 2.00    Types: Cigarettes  . Smokeless tobacco: Never Used  . Alcohol use 0.6 oz/week    1 Glasses of wine per week     Comment: 1 glass of wine daily  . Drug use: No  . Sexual activity: Not on file   Other Topics Concern  . Not on file   Social History Narrative  . No narrative on file   Past Surgical History:  Procedure Laterality Date  . ABDOMINAL HYSTERECTOMY    . APPENDECTOMY    . BREAST SURGERY     cyst removed from right  breast  . EYE SURGERY Bilateral    Cataract Extractions  . REVERSE SHOULDER ARTHROPLASTY Right 10/28/2014   Procedure: REVERSE SHOULDER ARTHROPLASTY;  Surgeon: Corky Mull, MD;  Location: [facility] ORS;  Service: Orthopedics;  Laterality: Right;   No family history on file.  No Known Allergies     Assessment & Plan:  Patient presents for a non-invasive study follow up for carotid stenosis. The stenosis has been followed by surveillance duplexes. The patient underwent a bilateral carotid duplex scan which showed no change from the previous exam duplex is stable at 60-79% ICA stenosis bilaterally. The patient denies experiencing Amaurosis Fugax, TIA like symptoms or focal motor deficits.   1. Bilateral carotid artery stenosis - Stable Studies reviewed  with patient. Patient asymptomatic with stable duplex.  No intervention at this time.  Patient to return in six months for surveillance carotid duplex. Patient to continue medical optimization with ASA. Patient to remain abstinent of tobacco use. I have discussed with the patient at length the risk factors for and pathogenesis of atherosclerotic disease and encouraged a healthy diet, regular exercise regimen and blood pressure / glucose control.  Patient was instructed to contact our office in the interim with problems such as arm / leg weakness or numbness, speech / swallowing difficulty or temporary monocular blindness. The patient expresses their understanding.   2. Diet-controlled type 2 diabetes mellitus ([place]) - Stable Encouraged good control as its slows the progression of atherosclerotic disease  3. Hyperlipidemia, unspecified hyperlipidemia type - Stable Encouraged good control as its slows the progression of atherosclerotic disease  Current Outpatient Prescriptions on File Prior to Visit  Medication Sig Dispense Refill  . amLODipine (NORVASC) 5 MG tablet Take 5 mg by mouth at bedtime.    Marland Kitchen aspirin EC 81 MG tablet Take by mouth.    . diphenhydrAMINE (BENADRYL) 50 MG tablet Take 50 mg by mouth at bedtime as needed for itching.    . hydrALAZINE (APRESOLINE) 25 MG tablet Take by mouth.    . hydrochlorothiazide (HYDRODIURIL) 25 MG tablet Take 25 mg by mouth daily.    Marland Kitchen HYDROcodone-acetaminophen (NORCO/VICODIN) 5-325 MG tablet Take 1-2 tablets by mouth every 4 (four) hours as needed for moderate pain or severe pain. 60 tablet 0  . lisinopril (PRINIVIL,ZESTRIL) 40 MG tablet Take 40 mg by mouth every morning.    . traMADol-acetaminophen (ULTRACET) 37.5-325 MG per tablet Take 1 tablet by mouth every 8 (eight) hours as needed.     No current facility-administered medications on file prior to visit.     There are no Patient Instructions on file for this visit. No Follow-up on  file.   Samanvitha Germany A Refugio Vandevoorde, PA-C

## 2016-02-25 ENCOUNTER — Other Ambulatory Visit: Payer: Self-pay | Admitting: *Deleted

## 2016-02-25 MED ORDER — HYDROCODONE-ACETAMINOPHEN 5-325 MG PO TABS: 1.0000 | ORAL_TABLET | ORAL | 0 refills | Status: DC | PRN

## 2016-03-28 ENCOUNTER — Other Ambulatory Visit: Payer: Self-pay | Admitting: *Deleted

## 2016-03-28 MED ORDER — HYDROCODONE-ACETAMINOPHEN 5-325 MG PO TABS: 1.0000 | ORAL_TABLET | ORAL | 0 refills | Status: DC | PRN

## 2016-04-26 ENCOUNTER — Other Ambulatory Visit: Payer: Self-pay | Admitting: *Deleted

## 2016-04-26 MED ORDER — HYDROCODONE-ACETAMINOPHEN 5-325 MG PO TABS: 1.0000 | ORAL_TABLET | ORAL | 0 refills | Status: DC | PRN

## 2016-05-31 ENCOUNTER — Other Ambulatory Visit: Payer: Self-pay | Admitting: *Deleted

## 2016-05-31 MED ORDER — HYDROCODONE-ACETAMINOPHEN 5-325 MG PO TABS: 1.0000 | ORAL_TABLET | ORAL | 0 refills | Status: DC | PRN

## 2016-06-14 NOTE — Progress Notes (Signed)
[place]  [phone] [phone]  ID: Chaney Born OB: April 04, 1929  MR#: [id number]  IHW#:[id number]  Patient Care Team: Glendon Axe, MD as PCP - General (Internal Medicine)  CHIEF COMPLAINT: MDS 5q-, CLL  INTERVAL HISTORY: Patient returns to clinic today for her routine follow-up. She continues to have chronic weakness and fatigue. She otherwise feels well and is asymptomatic. She has no neurologic complaints. She denies any recent fevers or illnesses. She denies any night sweats or weight loss. She has no chest pain or shortness of breath. She denies any nausea, vomiting, constipation, or diarrhea. She has no urinary complaints. Patient offers no further specific complaints today.   REVIEW OF SYSTEMS:   Review of Systems  Constitutional: Negative for fever, malaise/fatigue and weight loss.  Respiratory: Negative.  Negative for cough and shortness of breath.   Cardiovascular: Negative.  Negative for chest pain and leg swelling.  Gastrointestinal: Negative.  Negative for abdominal pain, blood in stool and melena.  Genitourinary: Negative.   Musculoskeletal: Negative.  Negative for joint pain.  Neurological: Negative.  Negative for sensory change and weakness.  Endo/Heme/Allergies: Does not bruise/bleed easily.  Psychiatric/Behavioral: Negative.  The patient is not nervous/anxious.     As per HPI. Otherwise, a complete review of systems is negative.  PAST MEDICAL HISTORY: Past Medical History:  Diagnosis Date  . Anemia    Myelodysplastic syndrome  . Arthritis   . Hypertension   . Leukemia, chronic lymphoid (HCC)    CLL  . Ovarian cancer ([place]) 1963    PAST SURGICAL HISTORY: Past Surgical History:  Procedure Laterality Date  . ABDOMINAL HYSTERECTOMY    . APPENDECTOMY    . BREAST SURGERY     cyst removed from right breast  . EYE SURGERY Bilateral    Cataract Extractions  . REVERSE SHOULDER ARTHROPLASTY Right 10/28/2014   Procedure: REVERSE SHOULDER ARTHROPLASTY;  Surgeon: Corky Mull, MD;  Location: [facility] ORS;  Service: Orthopedics;  Laterality: Right;    FAMILY HISTORY: Reviewed and unchanged. No reported history of malignancy or chronic disease.     ADVANCED DIRECTIVES:    HEALTH MAINTENANCE: Social History  Substance Use Topics  . Smoking status: Former Smoker    Packs/day: 2.00    Types: Cigarettes  . Smokeless tobacco: Never Used  . Alcohol use 0.6 oz/week    1 Glasses of wine per week     Comment: 1 glass of wine daily     Colonoscopy:  PAP:  Bone density:  Lipid panel:  No Known Allergies  Current Outpatient Prescriptions  Medication Sig Dispense Refill  . amLODipine (NORVASC) 5 MG tablet Take 5 mg by mouth at bedtime.    Marland Kitchen aspirin EC 81 MG tablet Take by mouth.    . cyanocobalamin (,VITAMIN B-12,) 1000 MCG/ML injection Inject into the muscle.    . diphenhydrAMINE (BENADRYL) 50 MG tablet Take 50 mg by mouth at bedtime as needed for itching.    . Ferrous Sulfate (IRON SLOW RELEASE PO) Take by mouth.    . hydrALAZINE (APRESOLINE) 25 MG tablet Take by mouth.    . hydrochlorothiazide (HYDRODIURIL) 25 MG tablet Take 25 mg by mouth daily.    Marland Kitchen HYDROcodone-acetaminophen (NORCO/VICODIN) 5-325 MG tablet Take 1-2 tablets by mouth every 4 (four) hours as needed for moderate pain or severe pain. 60 tablet 0  . lisinopril (PRINIVIL,ZESTRIL) 40 MG tablet Take 40 mg by mouth every morning.    . Multiple Vitamins-Minerals (MULTIVITAMIN ADULT PO) Take by  mouth.    . SF 1.1 % GEL dental gel     . traMADol-acetaminophen (ULTRACET) 37.5-325 MG per tablet Take 1 tablet by mouth every 8 (eight) hours as needed.     No current facility-administered medications for this visit.     OBJECTIVE: Vitals:   06/17/16 0905  BP: (!) 201/68  Pulse: 90  Resp: 18  Temp: 98.5 F (36.9 C)     Body mass index is 22 kg/m.    ECOG FS:0 - Asymptomatic  General: Well-developed, well-nourished, no acute  distress. Eyes: Pink conjunctiva, anicteric sclera. Lungs: Clear to auscultation bilaterally. Heart: Regular rate and rhythm. No rubs, murmurs, or gallops. Abdomen: Soft, nontender, nondistended. No organomegaly noted, normoactive bowel sounds. Musculoskeletal: No edema, cyanosis, or clubbing. Neuro: Alert, answering all questions appropriately. Cranial nerves grossly intact. Skin: No rashes or petechiae noted. Psych: Normal affect. Lymphatics: No cervical, calvicular, axillary or inguinal LAD.   LAB RESULTS:  Lab Results  Component Value Date   NA 135 06/17/2016   K 3.6 06/17/2016   CL 104 06/17/2016   CO2 22 06/17/2016   GLUCOSE 177 (H) 06/17/2016   BUN 18 06/17/2016   CREATININE 0.90 06/17/2016   CALCIUM 9.7 06/17/2016   PROT 7.3 03/27/2013   ALBUMIN 4.1 03/27/2013   AST 12 (L) 03/27/2013   ALT 20 03/27/2013   ALKPHOS 87 03/27/2013   BILITOT 0.5 03/27/2013   GFRNONAA 57 (L) 06/17/2016   GFRAA >60 06/17/2016    Lab Results  Component Value Date   WBC 4.4 06/17/2016   NEUTROABS PENDING 06/17/2016   HGB 8.2 (L) 06/17/2016   HCT 24.1 (L) 06/17/2016   MCV 115.2 (H) 06/17/2016   PLT 326 06/17/2016   Lab Results  Component Value Date   IRON 193 (H) 12/18/2015   TIBC 380 12/18/2015   IRONPCTSAT 51 (H) 12/18/2015   Lab Results  Component Value Date   FERRITIN 261 12/18/2015     STUDIES: No results found.  ASSESSMENT: MDS 5q-, CLL   PLAN:    1.  CLL:  CT scan results from June 09, 2014 were independently reviewed and did not reveal any evidence of recurrent disease. All of her labwork is also either negative or within normal limits. Patient last received single agent Rituxan in February 2015. No treatment is needed at this time. Patient expressed understanding that she will may need to reinitiate treatment at some point in the future.  Return to clinic in 6 months for repeat laboratory work and routine evaluation.  2.  Anemia:  Hemoglobin decreased, but  essentially stable. Iron stores are slightly elevated. Continue B-12 injections as ordered.  3.  Thrombocytopenia:  Platelet count is now within normal limits. 4.  MDS, 5q-:  Continue to monitor, no bone marrow biopsy is not needed at this time, plus patient refuses one anyway.  Patient continues to refuse treatment with Revlimid.   Patient expressed understanding and was in agreement with this plan. She also understands that She can call clinic at any time with any questions, concerns, or complaints.   Lloyd Huger, MD   06/17/2016 9:13 AM

## 2016-06-15 ENCOUNTER — Other Ambulatory Visit: Payer: Self-pay

## 2016-06-15 DIAGNOSIS — D46C Myelodysplastic syndrome with isolated del(5q) chromosomal abnormality: Secondary | ICD-10-CM

## 2016-06-15 DIAGNOSIS — C9110 Chronic lymphocytic leukemia of B-cell type not having achieved remission: Secondary | ICD-10-CM

## 2016-06-17 ENCOUNTER — Inpatient Hospital Stay (HOSPITAL_BASED_OUTPATIENT_CLINIC_OR_DEPARTMENT_OTHER): Payer: Medicare Other | Admitting: Oncology

## 2016-06-17 ENCOUNTER — Inpatient Hospital Stay: Payer: Medicare Other | Attending: Oncology

## 2016-06-17 VITALS — BP 201/68 | HR 90 | Temp 98.5°F | Resp 18 | Wt 112.7 lb

## 2016-06-17 DIAGNOSIS — D649 Anemia, unspecified: Secondary | ICD-10-CM

## 2016-06-17 DIAGNOSIS — R531 Weakness: Secondary | ICD-10-CM | POA: Diagnosis not present

## 2016-06-17 DIAGNOSIS — Z8543 Personal history of malignant neoplasm of ovary: Secondary | ICD-10-CM | POA: Diagnosis not present

## 2016-06-17 DIAGNOSIS — I10 Essential (primary) hypertension: Secondary | ICD-10-CM

## 2016-06-17 DIAGNOSIS — D696 Thrombocytopenia, unspecified: Secondary | ICD-10-CM | POA: Insufficient documentation

## 2016-06-17 DIAGNOSIS — Z7982 Long term (current) use of aspirin: Secondary | ICD-10-CM

## 2016-06-17 DIAGNOSIS — D46C Myelodysplastic syndrome with isolated del(5q) chromosomal abnormality: Secondary | ICD-10-CM

## 2016-06-17 DIAGNOSIS — Z87891 Personal history of nicotine dependence: Secondary | ICD-10-CM

## 2016-06-17 DIAGNOSIS — Z79899 Other long term (current) drug therapy: Secondary | ICD-10-CM | POA: Insufficient documentation

## 2016-06-17 DIAGNOSIS — C9111 Chronic lymphocytic leukemia of B-cell type in remission: Secondary | ICD-10-CM | POA: Diagnosis present

## 2016-06-17 DIAGNOSIS — R5382 Chronic fatigue, unspecified: Secondary | ICD-10-CM | POA: Insufficient documentation

## 2016-06-17 DIAGNOSIS — C9110 Chronic lymphocytic leukemia of B-cell type not having achieved remission: Secondary | ICD-10-CM

## 2016-06-17 LAB — CBC WITH DIFFERENTIAL/PLATELET
Basophils Absolute: 0.2 10*3/uL — ABNORMAL HIGH (ref 0–0.1)
Basophils Relative: 4 %
Eosinophils Absolute: 0.1 10*3/uL (ref 0–0.7)
Eosinophils Relative: 3 %
HCT: 24.1 % — ABNORMAL LOW (ref 35.0–47.0)
Hemoglobin: 8.2 g/dL — ABNORMAL LOW (ref 12.0–16.0)
Lymphocytes Relative: 17 %
Lymphs Abs: 0.7 10*3/uL — ABNORMAL LOW (ref 1.0–3.6)
MCH: 39.1 pg — ABNORMAL HIGH (ref 26.0–34.0)
MCHC: 34 g/dL (ref 32.0–36.0)
MCV: 115.2 fL — ABNORMAL HIGH (ref 80.0–100.0)
Monocytes Absolute: 0.5 10*3/uL (ref 0.2–0.9)
Monocytes Relative: 11 %
Neutro Abs: 2.9 10*3/uL (ref 1.4–6.5)
Neutrophils Relative %: 65 %
Platelets: 326 10*3/uL (ref 150–440)
RBC: 2.1 MIL/uL — ABNORMAL LOW (ref 3.80–5.20)
RDW: 19.1 % — ABNORMAL HIGH (ref 11.5–14.5)
WBC: 4.4 10*3/uL (ref 3.6–11.0)

## 2016-06-17 LAB — BASIC METABOLIC PANEL
Anion gap: 9 (ref 5–15)
BUN: 18 mg/dL (ref 6–20)
CO2: 22 mmol/L (ref 22–32)
Calcium: 9.7 mg/dL (ref 8.9–10.3)
Chloride: 104 mmol/L (ref 101–111)
Creatinine, Ser: 0.9 mg/dL (ref 0.44–1.00)
GFR calc Af Amer: >60 mL/min (ref >60)
GFR calc non Af Amer: 57 mL/min — ABNORMAL LOW (ref >60)
Glucose, Bld: 177 mg/dL — ABNORMAL HIGH (ref 65–99)
Potassium: 3.6 mmol/L (ref 3.5–5.1)
Sodium: 135 mmol/L (ref 135–145)

## 2016-06-17 LAB — LACTATE DEHYDROGENASE: LDH: 140 U/L (ref 98–192)

## 2016-06-30 ENCOUNTER — Other Ambulatory Visit: Payer: Self-pay | Admitting: *Deleted

## 2016-06-30 MED ORDER — HYDROCODONE-ACETAMINOPHEN 5-325 MG PO TABS: 1.0000 | ORAL_TABLET | ORAL | 0 refills | Status: DC | PRN

## 2016-07-27 ENCOUNTER — Other Ambulatory Visit: Payer: Self-pay | Admitting: *Deleted

## 2016-07-27 MED ORDER — HYDROCODONE-ACETAMINOPHEN 5-325 MG PO TABS: 1.0000 | ORAL_TABLET | ORAL | 0 refills | Status: DC | PRN

## 2016-08-08 ENCOUNTER — Encounter (INDEPENDENT_AMBULATORY_CARE_PROVIDER_SITE_OTHER): Payer: Self-pay | Admitting: Vascular Surgery

## 2016-08-08 ENCOUNTER — Ambulatory Visit (INDEPENDENT_AMBULATORY_CARE_PROVIDER_SITE_OTHER): Payer: Medicare Other

## 2016-08-08 ENCOUNTER — Ambulatory Visit (INDEPENDENT_AMBULATORY_CARE_PROVIDER_SITE_OTHER): Payer: Medicare Other | Admitting: Vascular Surgery

## 2016-08-08 VITALS — BP 195/74 | HR 80 | Resp 16 | Ht 60.0 in | Wt 113.0 lb

## 2016-08-08 DIAGNOSIS — E119 Type 2 diabetes mellitus without complications: Secondary | ICD-10-CM

## 2016-08-08 DIAGNOSIS — E785 Hyperlipidemia, unspecified: Secondary | ICD-10-CM

## 2016-08-08 DIAGNOSIS — N183 Chronic kidney disease, stage 3 unspecified: Secondary | ICD-10-CM

## 2016-08-08 DIAGNOSIS — I6523 Occlusion and stenosis of bilateral carotid arteries: Secondary | ICD-10-CM | POA: Diagnosis not present

## 2016-08-08 LAB — VAS US CAROTID
LEFT ECA DIAS: -20 cm/s
LEFT VERTEBRAL DIAS: 22 cm/s
Left CCA dist dias: 27 cm/s
Left CCA dist sys: 125 cm/s
Left CCA prox dias: 22 cm/s
Left CCA prox sys: 124 cm/s
Left ICA dist dias: -31 cm/s
Left ICA dist sys: -129 cm/s
Left ICA prox dias: 37 cm/s
Left ICA prox sys: 270 cm/s
RIGHT CCA MID DIAS: 21 cm/s
RIGHT ECA DIAS: -20 cm/s
RIGHT VERTEBRAL DIAS: 16 cm/s
Right CCA prox dias: 18 cm/s
Right CCA prox sys: 101 cm/s
Right cca dist sys: 114 cm/s

## 2016-08-08 NOTE — Progress Notes (Signed)
MRN : [id number]  Jacqueline Russo is a 83 y.o. (1929/05/22) female who presents with chief complaint of No chief complaint on file.   History of Present Illness: The patient is seen for follow up evaluation of carotid stenosis. The carotid stenosis followed by ultrasound.   The patient denies amaurosis fugax. There is no recent history of TIA symptoms or focal motor deficits. There is no prior documented CVA.  The patient is taking enteric-coated aspirin 81 mg daily.  There is no history of migraine headaches. There is no history of seizures.  The patient has a history of coronary artery disease, no recent episodes of angina or shortness of breath. The patient denies PAD or claudication symptoms. There is a history of hyperlipidemia which is being treated with a statin.    Carotid Duplex done today shows 60-79% bilateral ICA stenosis.  No change compared to last study in 02/04/2016  No outpatient prescriptions have been marked as taking for the 08/08/16 encounter (Appointment) with Delana Meyer, Dolores Lory, MD.    Past Medical History:  Diagnosis Date  . Anemia    Myelodysplastic syndrome  . Arthritis   . Hypertension   . Leukemia, chronic lymphoid (HCC)    CLL  . Ovarian cancer ([place]) 1963    Past Surgical History:  Procedure Laterality Date  . ABDOMINAL HYSTERECTOMY    . APPENDECTOMY    . BREAST SURGERY     cyst removed from right breast  . EYE SURGERY Bilateral    Cataract Extractions  . REVERSE SHOULDER ARTHROPLASTY Right 10/28/2014   Procedure: REVERSE SHOULDER ARTHROPLASTY;  Surgeon: Corky Mull, MD;  Location: [facility] ORS;  Service: Orthopedics;  Laterality: Right;    Social History Social History  Substance Use Topics  . Smoking status: Former Smoker    Packs/day: 2.00    Types: Cigarettes  . Smokeless tobacco: Never Used  . Alcohol use 0.6 oz/week    1 Glasses of wine per week     Comment: 1 glass of wine daily    Family History No family history on file.  No  Known Allergies   REVIEW OF SYSTEMS (Negative unless checked)  Constitutional: [] Weight loss  [] Fever  [] Chills Cardiac: [] Chest pain   [] Chest pressure   [] Palpitations   [] Shortness of breath when laying flat   [] Shortness of breath with exertion. Vascular:  [] Pain in legs with walking   [x] Pain in legs; cramping  [] History of DVT   [] Phlebitis   [] Swelling in legs   [] Varicose veins   [] Non-healing ulcers Pulmonary:   [] Uses home oxygen   [] Productive cough   [] Hemoptysis   [] Wheeze  [] COPD   [] Asthma Neurologic:  [] Dizziness   [] Seizures   [] History of stroke   [] History of TIA  [] Aphasia   [] Vissual changes   [] Weakness or numbness in arm   [] Weakness or numbness in leg Musculoskeletal:   [] Joint swelling   [] Joint pain   [] Low back pain Hematologic:  [] Easy bruising  [] Easy bleeding   [] Hypercoagulable state   [] Anemic Gastrointestinal:  [] Diarrhea   [] Vomiting  [] Gastroesophageal reflux/heartburn   [] Difficulty swallowing. Genitourinary:  [] Chronic kidney disease   [] Difficult urination  [] Frequent urination   [] Blood in urine Skin:  [] Rashes   [] Ulcers  Psychological:  [] History of anxiety   []  History of major depression.  Physical Examination  There were no vitals filed for this visit. There is no height or weight on file to calculate BMI. Gen: WD/WN, NAD Head: [place]/AT, No  temporalis wasting.  Ear/Nose/Throat: Hearing grossly intact, nares w/o erythema or drainage Eyes: PER, EOMI, sclera nonicteric.  Neck: Supple, no large masses.   Pulmonary:  Good air movement, no audible wheezing bilaterally, no use of accessory muscles.  Cardiac: RRR, no JVD Vascular: bilateral carotid bruit Vessel Right Left  Radial Palpable Palpable  Ulnar Palpable Palpable  Brachial Palpable Palpable  Carotid Palpable Palpable  Gastrointestinal: Non-distended. No guarding/no peritoneal signs.  Musculoskeletal: M/S 5/5 throughout.  No deformity or atrophy.  Neurologic: CN 2-12 intact. Symmetrical.   Speech is fluent. Motor exam as listed above. Psychiatric: Judgment intact, Mood & affect appropriate for pt's clinical situation. Dermatologic: No rashes or ulcers noted.  No changes consistent with cellulitis. Lymph : No lichenification or skin changes of chronic lymphedema.  CBC Lab Results  Component Value Date   WBC 4.4 06/17/2016   HGB 8.2 (L) 06/17/2016   HCT 24.1 (L) 06/17/2016   MCV 115.2 (H) 06/17/2016   PLT 326 06/17/2016    BMET    Component Value Date/Time   NA 135 06/17/2016 0828   NA 140 06/26/2013 1044   K 3.6 06/17/2016 0828   K 3.7 06/26/2013 1044   CL 104 06/17/2016 0828   CL 102 06/26/2013 1044   CO2 22 06/17/2016 0828   CO2 32 06/26/2013 1044   GLUCOSE 177 (H) 06/17/2016 0828   GLUCOSE 121 (H) 06/26/2013 1044   BUN 18 06/17/2016 0828   BUN 16 06/26/2013 1044   CREATININE 0.90 06/17/2016 0828   CREATININE 1.01 01/06/2014 0828   CALCIUM 9.7 06/17/2016 0828   CALCIUM 9.0 06/26/2013 1044   GFRNONAA 57 (L) 06/17/2016 0828   GFRNONAA 56 (L) 01/06/2014 0828   GFRNONAA >60 06/26/2013 1044   GFRAA >60 06/17/2016 0828   GFRAA >60 01/06/2014 0828   GFRAA >60 06/26/2013 1044   CrCl cannot be calculated (Patient's most recent lab result is older than the maximum 21 days allowed.).  COAG Lab Results  Component Value Date   INR 1.02 10/15/2014   INR 1.1 10/21/2012    Radiology No results found.  Assessment/Plan 1. Bilateral carotid artery stenosis Recommend:  Given the patient's asymptomatic subcritical stenosis no further invasive testing or surgery at this time.  Duplex ultrasound shows 60-79% stenosis bilaterally.  Continue antiplatelet therapy as prescribed Continue management of CAD, HTN and Hyperlipidemia Healthy heart diet,  encouraged exercise at least 4 times per week Follow up in 6 months with duplex ultrasound and physical exam based on >50% stenosis of the bilateral carotid artery   2. Diet-controlled type 2 diabetes mellitus  ([place]) Continue hypoglycemic medications as already ordered, these medications have been reviewed and there are no changes at this time.  Hgb A1C to be monitored as already arranged by primary service   3. Hyperlipidemia, unspecified hyperlipidemia type Continue statin as ordered and reviewed, no changes at this time   4. CKD (chronic kidney disease) stage 3, GFR 30-59 ml/min Continue antihypertensive medications as already ordered, these medications have been reviewed and there are no changes at this time.  Avoid dehydration and nephrotoxic medications     Hortencia Pilar, MD  08/08/2016 8:39 AM

## 2016-08-29 ENCOUNTER — Other Ambulatory Visit: Payer: Self-pay | Admitting: *Deleted

## 2016-08-29 MED ORDER — HYDROCODONE-ACETAMINOPHEN 5-325 MG PO TABS: 1.0000 | ORAL_TABLET | ORAL | 0 refills | Status: DC | PRN

## 2016-09-19 ENCOUNTER — Other Ambulatory Visit: Payer: Self-pay | Admitting: Internal Medicine

## 2016-09-19 DIAGNOSIS — N289 Disorder of kidney and ureter, unspecified: Principal | ICD-10-CM

## 2016-09-19 DIAGNOSIS — N189 Chronic kidney disease, unspecified: Secondary | ICD-10-CM

## 2016-09-27 ENCOUNTER — Encounter: Payer: Self-pay | Admitting: Radiology

## 2016-09-27 ENCOUNTER — Ambulatory Visit
Admission: RE | Admit: 2016-09-27 | Discharge: 2016-09-27 | Disposition: A | Discharge status: Home / Self Care | Payer: Medicare Other | Source: Ambulatory Visit | Attending: Internal Medicine | Admitting: Internal Medicine

## 2016-09-27 DIAGNOSIS — N281 Cyst of kidney, acquired: Secondary | ICD-10-CM | POA: Diagnosis not present

## 2016-09-27 DIAGNOSIS — N189 Chronic kidney disease, unspecified: Secondary | ICD-10-CM

## 2016-09-27 DIAGNOSIS — N289 Disorder of kidney and ureter, unspecified: Secondary | ICD-10-CM | POA: Insufficient documentation

## 2016-09-28 ENCOUNTER — Other Ambulatory Visit: Payer: Self-pay | Admitting: *Deleted

## 2016-09-28 MED ORDER — HYDROCODONE-ACETAMINOPHEN 5-325 MG PO TABS: 1.0000 | ORAL_TABLET | ORAL | 0 refills | Status: DC | PRN

## 2016-10-28 ENCOUNTER — Encounter: Payer: Self-pay | Admitting: Emergency Medicine

## 2016-10-28 ENCOUNTER — Emergency Department: Payer: Medicare Other

## 2016-10-28 ENCOUNTER — Emergency Department
Admission: EM | Admit: 2016-10-28 | Discharge: 2016-10-28 | Disposition: A | Discharge status: Home / Self Care | Payer: Medicare Other | Attending: Emergency Medicine | Admitting: Emergency Medicine

## 2016-10-28 DIAGNOSIS — R079 Chest pain, unspecified: Secondary | ICD-10-CM | POA: Diagnosis present

## 2016-10-28 DIAGNOSIS — C9511 Chronic leukemia of unspecified cell type, in remission: Secondary | ICD-10-CM | POA: Diagnosis not present

## 2016-10-28 DIAGNOSIS — I10 Essential (primary) hypertension: Secondary | ICD-10-CM

## 2016-10-28 DIAGNOSIS — R251 Tremor, unspecified: Secondary | ICD-10-CM | POA: Diagnosis not present

## 2016-10-28 DIAGNOSIS — I6523 Occlusion and stenosis of bilateral carotid arteries: Secondary | ICD-10-CM | POA: Insufficient documentation

## 2016-10-28 DIAGNOSIS — N183 Chronic kidney disease, stage 3 (moderate): Secondary | ICD-10-CM | POA: Diagnosis not present

## 2016-10-28 DIAGNOSIS — Z8543 Personal history of malignant neoplasm of ovary: Secondary | ICD-10-CM | POA: Diagnosis not present

## 2016-10-28 DIAGNOSIS — Z87891 Personal history of nicotine dependence: Secondary | ICD-10-CM | POA: Insufficient documentation

## 2016-10-28 DIAGNOSIS — E1122 Type 2 diabetes mellitus with diabetic chronic kidney disease: Secondary | ICD-10-CM | POA: Diagnosis not present

## 2016-10-28 DIAGNOSIS — D46C Myelodysplastic syndrome with isolated del(5q) chromosomal abnormality: Secondary | ICD-10-CM | POA: Diagnosis not present

## 2016-10-28 DIAGNOSIS — I129 Hypertensive chronic kidney disease with stage 1 through stage 4 chronic kidney disease, or unspecified chronic kidney disease: Secondary | ICD-10-CM | POA: Insufficient documentation

## 2016-10-28 LAB — BASIC METABOLIC PANEL
Anion gap: 15 (ref 5–15)
BUN: 17 mg/dL (ref 6–20)
CO2: 21 mmol/L — ABNORMAL LOW (ref 22–32)
Calcium: 10.6 mg/dL — ABNORMAL HIGH (ref 8.9–10.3)
Chloride: 100 mmol/L — ABNORMAL LOW (ref 101–111)
Creatinine, Ser: 0.72 mg/dL (ref 0.44–1.00)
GFR calc Af Amer: >60 mL/min (ref >60)
GFR calc non Af Amer: >60 mL/min (ref >60)
Glucose, Bld: 124 mg/dL — ABNORMAL HIGH (ref 65–99)
Potassium: 3.2 mmol/L — ABNORMAL LOW (ref 3.5–5.1)
Sodium: 136 mmol/L (ref 135–145)

## 2016-10-28 LAB — CBC
HCT: 24.5 % — ABNORMAL LOW (ref 35.0–47.0)
Hemoglobin: 8.5 g/dL — ABNORMAL LOW (ref 12.0–16.0)
MCH: 39.7 pg — ABNORMAL HIGH (ref 26.0–34.0)
MCHC: 34.8 g/dL (ref 32.0–36.0)
MCV: 114.1 fL — ABNORMAL HIGH (ref 80.0–100.0)
Platelets: 425 10*3/uL (ref 150–440)
RBC: 2.15 MIL/uL — ABNORMAL LOW (ref 3.80–5.20)
RDW: 19.2 % — ABNORMAL HIGH (ref 11.5–14.5)
WBC: 5.4 10*3/uL (ref 3.6–11.0)

## 2016-10-28 LAB — TROPONIN I
Troponin I: <0.03 ng/mL (ref <0.03)
Troponin I: <0.03 ng/mL (ref <0.03)

## 2016-10-28 MED ORDER — LABETALOL HCL 5 MG/ML IV SOLN
10.0000 mg | Freq: Once | INTRAVENOUS | Status: AC
  Administered 2016-10-28: 10 mg via INTRAVENOUS

## 2016-10-28 MED ORDER — LABETALOL HCL 5 MG/ML IV SOLN
INTRAVENOUS | Status: AC
  Administered 2016-10-28: 10 mg via INTRAVENOUS
  Filled 2016-10-28: qty 4

## 2016-10-28 MED ORDER — LABETALOL HCL 5 MG/ML IV SOLN
10.0000 mg | Freq: Once | INTRAVENOUS | Status: AC
  Administered 2016-10-28: 10 mg via INTRAVENOUS
  Filled 2016-10-28: qty 4

## 2016-10-28 NOTE — ED Triage Notes (Signed)
Pt to ed with c/o tightness in chest and sob and shaking that started today.  Pt reports elevated blood pressure at home today.

## 2016-10-28 NOTE — Discharge Instructions (Signed)
As we discussed, we believe your tremulousness is likely related to your elevated blood pressure.  We offered admission to the hospital to work on your blood pressure control and for further monitoring, but we understand and respect your desire to go home and follow up as an outpatient.  Please take your regular medications tonight.  Return to the Emergency Department (ED) if you experience any worsening chest pain/pressure/tightness, difficulty breathing, or sudden sweating, or other symptoms that concern you.  Follow up with your doctor at the next available opportunity.

## 2016-10-28 NOTE — ED Notes (Signed)
Patient 1-person assisted to restroom.  Patient still trembling at this time but able to ambulate.

## 2016-10-28 NOTE — ED Provider Notes (Signed)
[facility] Emergency Department Provider Note  ____________________________________________   First MD Initiated Contact with Patient 10/28/16 1615     (approximate)  I have reviewed the triage vital signs and the nursing notes.   HISTORY  Chief Complaint Chest Pain    HPI Jacqueline Russo is a 83 y.o. female with medical history as listed below who presents for evaluation of pressure on her chest and tremulousness as well as persistently elevated blood pressure at home.  Typically BP runs in the 150s at the most, and she has had several episodes of SBP in the 180s-200s.  Although the tremulousness is worse when the BP is up, she does not feel they are related.  She feels anxious but also does not feel that anxiety is causing or contributing to her BP or her tremulousness.  She took her regular blood pressure medication this morning but has not had her evening dose(s).  No new medications recently.  Describes the chest discomfort mostly as a sensation of palpitations and "heart pounding", but also has some tightness or pressure in the chest.  Mild severity.    Nothing in particular makes the patient's symptoms better nor worse.  Denies fever/chills, N/V/D, SOB, abdominal pain.  Past Medical History:  Diagnosis Date  . Anemia    Myelodysplastic syndrome  . Arthritis   . Hypertension   . Leukemia, chronic lymphoid (HCC)    CLL  . Ovarian cancer [facility]) 1963    Patient Active Problem List   Diagnosis Date Noted  . Bilateral carotid artery stenosis 02/08/2016  . CLL (chronic lymphocytic leukemia) ([place]) 11/16/2015  . MDS (myelodysplastic syndrome) with 5q deletion ([place]) 11/16/2015  . Diet-controlled type 2 diabetes mellitus ([place]) 04/22/2015  . CKD (chronic kidney disease) stage 3, GFR 30-59 ml/min 04/22/2015  . Hyperlipemia 01/14/2014  . Primary osteoarthritis of both hands 01/14/2014    Past Surgical History:  Procedure Laterality Date  . ABDOMINAL  HYSTERECTOMY    . APPENDECTOMY    . BREAST SURGERY     cyst removed from right breast  . EYE SURGERY Bilateral    Cataract Extractions  . REVERSE SHOULDER ARTHROPLASTY Right 10/28/2014   Procedure: REVERSE SHOULDER ARTHROPLASTY;  Surgeon: Corky Mull, MD;  Location: [facility] ORS;  Service: Orthopedics;  Laterality: Right;    Prior to Admission medications   Medication Sig Start Date End Date Taking? Authorizing Provider  amLODipine (NORVASC) 10 MG tablet  06/24/16   [provider]  cyanocobalamin (,VITAMIN B-12,) 1000 MCG/ML injection Inject into the muscle. 03/09/16   [provider]  diphenhydrAMINE (BENADRYL) 50 MG tablet Take 50 mg by mouth at bedtime as needed for itching.    [provider]  Ferrous Sulfate (IRON SLOW RELEASE PO) Take by mouth.    [provider]  hydrALAZINE (APRESOLINE) 25 MG tablet Take by mouth. 05/26/15 06/17/16  [provider]  hydrochlorothiazide (HYDRODIURIL) 25 MG tablet Take 25 mg by mouth daily.    [provider]  HYDROcodone-acetaminophen (NORCO/VICODIN) 5-325 MG tablet Take 1-2 tablets by mouth every 4 (four) hours as needed for moderate pain or severe pain. 09/28/16   Lloyd Huger, MD  lisinopril (PRINIVIL,ZESTRIL) 40 MG tablet Take 40 mg by mouth every morning.    [provider]  Multiple Vitamins-Minerals (MULTIVITAMIN ADULT PO) Take by mouth.    [provider]  SF 1.1 % GEL dental gel  05/31/16   [provider]  tiZANidine (ZANAFLEX) 2 MG tablet  08/05/16   [provider]  traMADol-acetaminophen (ULTRACET) 37.5-325 MG per tablet Take 1 tablet by mouth every 8 (eight) hours as needed.    [provider]    Allergies Patient has no known allergies.  History reviewed. No pertinent family history.  Social History Social History  Substance Use Topics  . Smoking status: Former Smoker    Packs/day: 2.00    Types: Cigarettes  . Smokeless tobacco:  Never Used  . Alcohol use 0.6 oz/week    1 Glasses of wine per week     Comment: 1 glass of wine daily    Review of Systems Constitutional: No fever/chills Eyes: No visual changes. ENT: No sore throat. Cardiovascular: Palpitations and dull chest pressure Respiratory: Denies shortness of breath. Gastrointestinal: No abdominal pain.  No nausea, no vomiting.  No diarrhea.  No constipation. Genitourinary: Negative for dysuria. Musculoskeletal: Negative for neck pain.  Negative for back pain. Integumentary: Negative for rash. Neurological: Negative for headaches, focal weakness or numbness.  Tremulousness, waxes and wanes.   ____________________________________________   PHYSICAL EXAM:  VITAL SIGNS: ED Triage Vitals [10/28/16 1501]  Enc Vitals Group     BP (!) 181/52     Pulse Rate 99     Resp 18     Temp 98.2 F (36.8 C)     Temp Source Oral     SpO2 97 %     Weight 50.3 kg (111 lb)     Height 1.524 m (5')     Head Circumference      Peak Flow      Pain Score 0     Pain Loc      Pain Edu?      Excl. in [place]?     Constitutional: Alert and oriented. Well appearing and in no acute distress though she does appear a bit anxious Eyes: Conjunctivae are normal.  Head: Atraumatic. Nose: No congestion/rhinnorhea. Mouth/Throat: Mucous membranes are moist. Neck: No stridor.  No meningeal signs.   Cardiovascular: Normal rate, regular rhythm. Good peripheral circulation. Grossly normal heart sounds. Respiratory: Normal respiratory effort.  No retractions. Lungs CTAB. Gastrointestinal: Soft and nontender. No distention.  Musculoskeletal: No lower extremity tenderness nor edema. No gross deformities of extremities. Neurologic:  Normal speech and language. No gross focal neurologic deficits are appreciated.   Intermittently tremulous. Skin:  Skin is warm, dry and intact. No rash noted. Psychiatric: Mood and affect are normal. Speech and behavior are  normal.  ____________________________________________   LABS (all labs ordered are listed, but only abnormal results are displayed)  Labs Reviewed  BASIC METABOLIC PANEL - Abnormal; Notable for the following:       Result Value   Potassium 3.2 (*)    Chloride 100 (*)    CO2 21 (*)    Glucose, Bld 124 (*)    Calcium 10.6 (*)    All other components within normal limits  CBC - Abnormal; Notable for the following:    RBC 2.15 (*)    Hemoglobin 8.5 (*)    HCT 24.5 (*)    MCV 114.1 (*)    MCH 39.7 (*)    RDW 19.2 (*)    All other components within normal limits  TROPONIN I  TROPONIN I   ____________________________________________  EKG  ED ECG REPORT I, Grafton Warzecha, the attending physician, personally viewed and interpreted this ECG.  Date: 10/28/2016 EKG Time: 15:05 Rate: 98 Rhythm: normal sinus rhythm QRS Axis: LAD Intervals: borderline LVH ST/T Wave  abnormalities: normal Narrative Interpretation: unremarkable  ____________________________________________  RADIOLOGY   Dg Chest 2 View  Result Date: 10/28/2016 CLINICAL DATA:  83 year old female with chest pressure and shortness of breath onset this morning. Progressive symptoms. Chronic lymphocytic leukemia. EXAM: CHEST  2 VIEW COMPARISON:  Chest radiographs 11/08/2015 and earlier. FINDINGS: Stable cardiomegaly and mediastinal contours. Calcified aortic atherosclerosis. Visualized tracheal air column is within normal limits. Chronic increased pulmonary interstitial markings appear stable since 2017. No pneumothorax, pulmonary edema, pleural effusion or acute pulmonary opacity. Chronic right shoulder arthroplasty. Osteopenia. No acute osseous abnormality identified. Negative visible bowel gas pattern. IMPRESSION: 1. No acute cardiopulmonary abnormality. 2. Chronic cardiomegaly and pulmonary interstitial changes. Electronically Signed   By: Genevie Ann M.D.   On: 10/28/2016 15:41     ____________________________________________   PROCEDURES  Critical Care performed: No   Procedure(s) performed:   Procedures   ____________________________________________   INITIAL IMPRESSION / ASSESSMENT AND PLAN / ED COURSE  Pertinent labs & imaging results that were available during my care of the patient were reviewed by me and considered in my medical decision making (see chart for details).  the patient's blood pressure is significantly elevated and I had my usual discussion with the patient about whether or not her elevated blood pressure is causing the rest of her symptoms or vice versa.  We agreed that I would give her a dose of labetalol and see if that improves her blood pressure and/or her other symptoms.  There is no evidence that she is suffering from TIA/CVA, ACS, PE, or other potentially life-threatening medical condition. I suspect anxiety may play a role although she denies this.  I will reassess after BP control with labetalol; if her symptoms do not improve, she may benefit from admission for improved BP control.  Clinical Course as of Oct 29 2103  Fri Oct 28, 2016  1929 Patient remains somewhat tremulous and BP, which initially responded to labetalol, has gone back up.  Will check a second troponin, provided another dose of labetalol, and then determine whether she needs to stay in the hospital or follow up as an outpatient.  [CF]  2022 the patient's blood pressure is down to about 155/57.  she is no longer tremulous and appears more comfortable.  I explained the reassuring results of her second negative troponin.  I offered admission to the hospitaion but she is adamant that she go home and absolutely does not want to stay in the hospital.  Given that there is no evidence of acute or emergent medical condition which requires a hospital stay, I think this is appropriate.  I gave my usual and customary return precautions.   She will take her regular blood  pressure mediines tonight and follow up with her primary care doctohe next available opportunity.  [CF]    Clinical Course User Index [CF] Hinda Kehr, MD    ____________________________________________  FINAL CLINICAL IMPRESSION(S) / ED DIAGNOSES  Final diagnoses:  Essential hypertension  Tremulousness     MEDICATIONS GIVEN DURING THIS VISIT:  Medications  labetalol (NORMODYNE,TRANDATE) injection 10 mg (10 mg Intravenous Given 10/28/16 1647)  labetalol (NORMODYNE,TRANDATE) injection 10 mg (10 mg Intravenous Given 10/28/16 1925)     NEW OUTPATIENT MEDICATIONS STARTED DURING THIS VISIT:  Discharge Medication List as of 10/28/2016  8:27 PM      Discharge Medication List as of 10/28/2016  8:27 PM      Discharge Medication List as of 10/28/2016  8:27 PM  Note:  This document was prepared using Dragon voice recognition software and may include unintentional dictation errors.    Hinda Kehr, MD 10/28/16 2105

## 2016-11-01 ENCOUNTER — Other Ambulatory Visit: Payer: Self-pay | Admitting: *Deleted

## 2016-11-01 MED ORDER — HYDROCODONE-ACETAMINOPHEN 5-325 MG PO TABS: 1.0000 | ORAL_TABLET | ORAL | 0 refills | Status: DC | PRN

## 2016-11-09 ENCOUNTER — Telehealth: Payer: Self-pay | Admitting: *Deleted

## 2016-11-09 NOTE — Telephone Encounter (Signed)
Patient called asking for recommendations for a PCP. Please advise

## 2016-11-09 NOTE — Telephone Encounter (Signed)
She will contact a group someone told her about to find a PCP

## 2016-11-09 NOTE — Telephone Encounter (Signed)
Tough. Depends on who is currently accepting new patients.

## 2016-12-01 ENCOUNTER — Other Ambulatory Visit: Payer: Self-pay | Admitting: *Deleted

## 2016-12-01 MED ORDER — HYDROCODONE-ACETAMINOPHEN 5-325 MG PO TABS: 1.0000 | ORAL_TABLET | ORAL | 0 refills | Status: DC | PRN

## 2016-12-19 NOTE — Progress Notes (Signed)
[place]  Telephone:(336) [phone] [phone]) [phone]  ID: Chaney Born OB: 01-21-1930  MR#: [id number]  UVO#:[id number]  Patient Care Team: Glendon Axe, MD as PCP - General (Internal Medicine)  CHIEF COMPLAINT: MDS 5q-, CLL  INTERVAL HISTORY: Patient returns to clinic today for her routine follow-up. She has felt more weak and fatigued over the past several weeks, but otherwise feels well. She has no neurologic complaints. She denies any recent fevers or illnesses. She denies any night sweats or weight loss. She has no chest pain or shortness of breath. She denies any nausea, vomiting, constipation, or diarrhea. She denies any melena or hematochezia. She has no urinary complaints. Patient offers no further specific complaints today.   REVIEW OF SYSTEMS:   Review of Systems  Constitutional: Positive for malaise/fatigue. Negative for fever and weight loss.  Respiratory: Negative.  Negative for cough and shortness of breath.   Cardiovascular: Negative.  Negative for chest pain and leg swelling.  Gastrointestinal: Negative.  Negative for abdominal pain, blood in stool and melena.  Genitourinary: Negative.   Musculoskeletal: Positive for joint pain.  Skin: Negative.  Negative for rash.  Neurological: Positive for weakness. Negative for sensory change.  Endo/Heme/Allergies: Does not bruise/bleed easily.  Psychiatric/Behavioral: Negative.  The patient is not nervous/anxious.     As per HPI. Otherwise, a complete review of systems is negative.  PAST MEDICAL HISTORY: Past Medical History:  Diagnosis Date  . Anemia    Myelodysplastic syndrome  . Arthritis   . Hypertension   . Leukemia, chronic lymphoid (HCC)    CLL  . Ovarian cancer ([place]) 1963    PAST SURGICAL HISTORY: Past Surgical History:  Procedure Laterality Date  . ABDOMINAL HYSTERECTOMY    . APPENDECTOMY    . BREAST SURGERY     cyst removed from right breast  . EYE SURGERY Bilateral    Cataract  Extractions  . REVERSE SHOULDER ARTHROPLASTY Right 10/28/2014   Procedure: REVERSE SHOULDER ARTHROPLASTY;  Surgeon: Corky Mull, MD;  Location: [facility] ORS;  Service: Orthopedics;  Laterality: Right;    FAMILY HISTORY: Reviewed and unchanged. No reported history of malignancy or chronic disease.     ADVANCED DIRECTIVES:    HEALTH MAINTENANCE: Social History  Substance Use Topics  . Smoking status: Former Smoker    Packs/day: 2.00    Types: Cigarettes  . Smokeless tobacco: Never Used  . Alcohol use 0.6 oz/week    1 Glasses of wine per week     Comment: 1 glass of wine daily     Colonoscopy:  PAP:  Bone density:  Lipid panel:  No Known Allergies  Current Outpatient Prescriptions  Medication Sig Dispense Refill  . amLODipine (NORVASC) 10 MG tablet     . cyanocobalamin (,VITAMIN B-12,) 1000 MCG/ML injection Inject into the muscle.    . diphenhydrAMINE (BENADRYL) 50 MG tablet Take 50 mg by mouth at bedtime as needed for itching.    . Ferrous Sulfate (IRON SLOW RELEASE PO) Take by mouth.    . hydrochlorothiazide (HYDRODIURIL) 25 MG tablet Take 25 mg by mouth daily.    Marland Kitchen HYDROcodone-acetaminophen (NORCO/VICODIN) 5-325 MG tablet Take 1-2 tablets by mouth every 4 (four) hours as needed for moderate pain or severe pain. 60 tablet 0  . lisinopril (PRINIVIL,ZESTRIL) 40 MG tablet Take 40 mg by mouth every morning.    . Multiple Vitamins-Minerals (MULTIVITAMIN ADULT PO) Take by mouth.    . SF 1.1 % GEL dental gel     .  tiZANidine (ZANAFLEX) 2 MG tablet     . traMADol-acetaminophen (ULTRACET) 37.5-325 MG per tablet Take 1 tablet by mouth every 8 (eight) hours as needed.    . hydrALAZINE (APRESOLINE) 25 MG tablet Take by mouth.     No current facility-administered medications for this visit.     OBJECTIVE: Vitals:   12/23/16 1012  BP: (!) 185/68  Pulse: 90  Resp: 18  Temp: (!) 96.8 F (36 C)     Body mass index is 21.14 kg/m.    ECOG FS:0 - Asymptomatic  General:  Well-developed, well-nourished, no acute distress. Eyes: Pink conjunctiva, anicteric sclera. Lungs: Clear to auscultation bilaterally. Heart: Regular rate and rhythm. No rubs, murmurs, or gallops. Abdomen: Soft, nontender, nondistended. No organomegaly noted, normoactive bowel sounds. Musculoskeletal: No edema, cyanosis, or clubbing. Neuro: Alert, answering all questions appropriately. Cranial nerves grossly intact. Skin: No rashes or petechiae noted. Psych: Normal affect. Lymphatics: No cervical, calvicular, axillary or inguinal LAD.   LAB RESULTS:  Lab Results  Component Value Date   NA 136 10/28/2016   K 3.2 (L) 10/28/2016   CL 100 (L) 10/28/2016   CO2 21 (L) 10/28/2016   GLUCOSE 124 (H) 10/28/2016   BUN 17 10/28/2016   CREATININE 0.72 10/28/2016   CALCIUM 10.6 (H) 10/28/2016   PROT 7.3 03/27/2013   ALBUMIN 4.1 03/27/2013   AST 12 (L) 03/27/2013   ALT 20 03/27/2013   ALKPHOS 87 03/27/2013   BILITOT 0.5 03/27/2013   GFRNONAA >60 10/28/2016   GFRAA >60 10/28/2016    Lab Results  Component Value Date   WBC 4.6 12/23/2016   NEUTROABS 3.0 12/23/2016   HGB 7.2 (L) 12/23/2016   HCT 20.6 (L) 12/23/2016   MCV 113.4 (H) 12/23/2016   PLT 517 (H) 12/23/2016   Lab Results  Component Value Date   IRON 193 (H) 12/18/2015   TIBC 380 12/18/2015   IRONPCTSAT 51 (H) 12/18/2015   Lab Results  Component Value Date   FERRITIN 261 12/18/2015     STUDIES: No results found.  ASSESSMENT: MDS 5q-, CLL   PLAN:    1.  CLL: CT scan results from June 09, 2014 were independently reviewed and did not reveal any evidence of recurrent disease. All of her labwork is also either negative or within normal limits. Patient last received single agent Rituxan in February 2015. No treatment is needed at this time. Patient expressed understanding that she will may need to reinitiate treatment at some point in the future.   2.  Anemia:  Patient's hemoglobin has decreased and she is now more  symptomatic. Return to clinic next week for one unit of packed red blood cells. Patient will then return to clinic in 1 month for repeat laboratory work and further evaluation.  3.  Thrombocytosis: Likely secondary to progressive anemia. Monitor. 4.  MDS, 5q-:  Continue to monitor, no bone marrow biopsy is not needed at this time, plus patient refuses one anyway.  Patient continues to refuse treatment with Revlimid.   Patient expressed understanding and was in agreement with this plan. She also understands that She can call clinic at any time with any questions, concerns, or complaints.   Lloyd Huger, MD   12/23/2016 11:15 AM

## 2016-12-23 ENCOUNTER — Other Ambulatory Visit: Payer: Self-pay

## 2016-12-23 ENCOUNTER — Inpatient Hospital Stay: Payer: Medicare Other | Attending: Oncology | Admitting: Oncology

## 2016-12-23 ENCOUNTER — Inpatient Hospital Stay: Payer: Medicare Other

## 2016-12-23 VITALS — BP 185/68 | HR 90 | Temp 96.8°F | Resp 18 | Wt 108.2 lb

## 2016-12-23 DIAGNOSIS — D696 Thrombocytopenia, unspecified: Secondary | ICD-10-CM | POA: Insufficient documentation

## 2016-12-23 DIAGNOSIS — C9110 Chronic lymphocytic leukemia of B-cell type not having achieved remission: Secondary | ICD-10-CM

## 2016-12-23 DIAGNOSIS — M1990 Unspecified osteoarthritis, unspecified site: Secondary | ICD-10-CM | POA: Insufficient documentation

## 2016-12-23 DIAGNOSIS — Z79899 Other long term (current) drug therapy: Secondary | ICD-10-CM | POA: Diagnosis not present

## 2016-12-23 DIAGNOSIS — Z9225 Personal history of immunosupression therapy: Secondary | ICD-10-CM | POA: Diagnosis not present

## 2016-12-23 DIAGNOSIS — R5383 Other fatigue: Secondary | ICD-10-CM | POA: Diagnosis not present

## 2016-12-23 DIAGNOSIS — D46C Myelodysplastic syndrome with isolated del(5q) chromosomal abnormality: Secondary | ICD-10-CM

## 2016-12-23 DIAGNOSIS — Z87891 Personal history of nicotine dependence: Secondary | ICD-10-CM | POA: Diagnosis not present

## 2016-12-23 DIAGNOSIS — R531 Weakness: Secondary | ICD-10-CM | POA: Diagnosis not present

## 2016-12-23 DIAGNOSIS — C9111 Chronic lymphocytic leukemia of B-cell type in remission: Secondary | ICD-10-CM | POA: Diagnosis not present

## 2016-12-23 DIAGNOSIS — R5381 Other malaise: Secondary | ICD-10-CM | POA: Diagnosis not present

## 2016-12-23 DIAGNOSIS — D649 Anemia, unspecified: Secondary | ICD-10-CM | POA: Insufficient documentation

## 2016-12-23 DIAGNOSIS — I10 Essential (primary) hypertension: Secondary | ICD-10-CM | POA: Diagnosis not present

## 2016-12-23 LAB — CBC WITH DIFFERENTIAL/PLATELET
Basophils Absolute: 0.2 10*3/uL — ABNORMAL HIGH (ref 0–0.1)
Basophils Relative: 4 %
Eosinophils Absolute: 0.2 10*3/uL (ref 0–0.7)
Eosinophils Relative: 5 %
HCT: 20.6 % — ABNORMAL LOW (ref 35.0–47.0)
Hemoglobin: 7.2 g/dL — ABNORMAL LOW (ref 12.0–16.0)
Lymphocytes Relative: 16 %
Lymphs Abs: 0.8 10*3/uL — ABNORMAL LOW (ref 1.0–3.6)
MCH: 39.6 pg — ABNORMAL HIGH (ref 26.0–34.0)
MCHC: 34.9 g/dL (ref 32.0–36.0)
MCV: 113.4 fL — ABNORMAL HIGH (ref 80.0–100.0)
Monocytes Absolute: 0.5 10*3/uL (ref 0.2–0.9)
Monocytes Relative: 11 %
Neutro Abs: 3 10*3/uL (ref 1.4–6.5)
Neutrophils Relative %: 64 %
Platelets: 517 10*3/uL — ABNORMAL HIGH (ref 150–440)
RBC: 1.81 MIL/uL — ABNORMAL LOW (ref 3.80–5.20)
RDW: 19.6 % — ABNORMAL HIGH (ref 11.5–14.5)
WBC: 4.6 10*3/uL (ref 3.6–11.0)

## 2016-12-23 NOTE — Progress Notes (Signed)
Patient denies any concerns today.  

## 2016-12-26 ENCOUNTER — Other Ambulatory Visit: Payer: Self-pay

## 2016-12-26 ENCOUNTER — Inpatient Hospital Stay: Payer: Medicare Other

## 2016-12-26 DIAGNOSIS — D46C Myelodysplastic syndrome with isolated del(5q) chromosomal abnormality: Secondary | ICD-10-CM

## 2016-12-26 DIAGNOSIS — C9110 Chronic lymphocytic leukemia of B-cell type not having achieved remission: Secondary | ICD-10-CM

## 2016-12-26 DIAGNOSIS — C9111 Chronic lymphocytic leukemia of B-cell type in remission: Secondary | ICD-10-CM | POA: Diagnosis not present

## 2016-12-26 LAB — PREPARE RBC (CROSSMATCH)

## 2016-12-27 ENCOUNTER — Inpatient Hospital Stay: Payer: Medicare Other

## 2016-12-27 DIAGNOSIS — D46C Myelodysplastic syndrome with isolated del(5q) chromosomal abnormality: Secondary | ICD-10-CM

## 2016-12-27 DIAGNOSIS — C9110 Chronic lymphocytic leukemia of B-cell type not having achieved remission: Secondary | ICD-10-CM

## 2016-12-27 DIAGNOSIS — C9111 Chronic lymphocytic leukemia of B-cell type in remission: Secondary | ICD-10-CM | POA: Diagnosis not present

## 2016-12-27 LAB — CBC WITH DIFFERENTIAL/PLATELET
Basophils Absolute: 0.1 10*3/uL (ref 0–0.1)
Basophils Relative: 3 %
Eosinophils Absolute: 0.1 10*3/uL (ref 0–0.7)
Eosinophils Relative: 3 %
HCT: 19.9 % — ABNORMAL LOW (ref 35.0–47.0)
Hemoglobin: 6.9 g/dL — ABNORMAL LOW (ref 12.0–16.0)
Lymphocytes Relative: 17 %
Lymphs Abs: 0.8 10*3/uL — ABNORMAL LOW (ref 1.0–3.6)
MCH: 39.5 pg — ABNORMAL HIGH (ref 26.0–34.0)
MCHC: 34.7 g/dL (ref 32.0–36.0)
MCV: 113.8 fL — ABNORMAL HIGH (ref 80.0–100.0)
Monocytes Absolute: 0.5 10*3/uL (ref 0.2–0.9)
Monocytes Relative: 10 %
Neutro Abs: 3.3 10*3/uL (ref 1.4–6.5)
Neutrophils Relative %: 67 %
Platelets: 497 10*3/uL — ABNORMAL HIGH (ref 150–440)
RBC: 1.75 MIL/uL — ABNORMAL LOW (ref 3.80–5.20)
RDW: 19.8 % — ABNORMAL HIGH (ref 11.5–14.5)
WBC: 4.9 10*3/uL (ref 3.6–11.0)

## 2016-12-27 MED ORDER — DIPHENHYDRAMINE HCL 50 MG/ML IJ SOLN
25.0000 mg | Freq: Once | INTRAMUSCULAR | Status: AC
  Administered 2016-12-27: 25 mg via INTRAVENOUS

## 2016-12-27 MED ORDER — SODIUM CHLORIDE 0.9 % IV SOLN
250.0000 mL | Freq: Once | INTRAVENOUS | Status: AC
  Administered 2016-12-27: 250 mL via INTRAVENOUS
  Filled 2016-12-27: qty 250

## 2016-12-27 MED ORDER — HEPARIN SOD (PORK) LOCK FLUSH 100 UNIT/ML IV SOLN: 500.0000 [IU] | Freq: Every day | INTRAVENOUS | Status: DC | PRN

## 2016-12-27 MED ORDER — ACETAMINOPHEN 325 MG PO TABS
650.0000 mg | ORAL_TABLET | Freq: Once | ORAL | Status: AC
  Administered 2016-12-27: 650 mg via ORAL

## 2016-12-28 LAB — BPAM RBC
Blood Product Expiration Date: 201810162359
ISSUE DATE / TIME: 201810090950
Unit Type and Rh: 6200

## 2016-12-28 LAB — TYPE AND SCREEN
ABO/RH(D): AB POS
Antibody Screen: NEGATIVE
Unit division: 0

## 2016-12-29 ENCOUNTER — Other Ambulatory Visit: Payer: Self-pay | Admitting: *Deleted

## 2016-12-29 MED ORDER — HYDROCODONE-ACETAMINOPHEN 5-325 MG PO TABS: 1.0000 | ORAL_TABLET | ORAL | 0 refills | Status: DC | PRN

## 2016-12-29 MED ORDER — TIZANIDINE HCL 2 MG PO TABS: 2.0000 mg | ORAL_TABLET | Freq: Every evening | ORAL | 0 refills | Status: AC | PRN

## 2016-12-29 NOTE — Telephone Encounter (Signed)
We can fill Zanaflex for one month but she needs to try and find a PCP to fill in the future.

## 2016-12-29 NOTE — Telephone Encounter (Signed)
Asking for refill of her Norco and also asking if Dr Grayland Ormond would refill her Zanaflex also until she gets another doctor. Please advise

## 2017-01-16 NOTE — Progress Notes (Signed)
[place]  Telephone:(336) [phone] Fax:(336) [phone]  ID: Chaney Born OB: 1930/03/13  MR#: [id number]  WPY#:[id number]  Patient Care Team: Glendon Axe, MD as PCP - General (Internal Medicine)  CHIEF COMPLAINT: MDS 5q-, CLL  INTERVAL HISTORY: Patient returns to clinic today for repeat laboratory work evaluation, and consideration of additional blood.  Her weakness and fatigue are essentially unchanged blood cells approximately 1 month ago.  She otherwise feels well. She has no neurologic complaints. She denies any recent fevers or illnesses. She denies any night sweats or weight loss. She has no chest pain or shortness of breath. She denies any nausea, vomiting, constipation, or diarrhea. She denies any melena or hematochezia. She has no urinary complaints. Patient offers no further specific complaints today.   REVIEW OF SYSTEMS:   Review of Systems  Constitutional: Positive for malaise/fatigue. Negative for fever and weight loss.  Respiratory: Negative.  Negative for cough and shortness of breath.   Cardiovascular: Negative.  Negative for chest pain and leg swelling.  Gastrointestinal: Negative.  Negative for abdominal pain, blood in stool and melena.  Genitourinary: Negative.   Musculoskeletal: Positive for joint pain.  Skin: Negative.  Negative for rash.  Neurological: Positive for weakness. Negative for sensory change.  Endo/Heme/Allergies: Does not bruise/bleed easily.  Psychiatric/Behavioral: Negative.  The patient is not nervous/anxious.     As per HPI. Otherwise, a complete review of systems is negative.  PAST MEDICAL HISTORY: Past Medical History:  Diagnosis Date  . Anemia    Myelodysplastic syndrome  . Arthritis   . Hypertension   . Leukemia, chronic lymphoid (HCC)    CLL  . Ovarian cancer ([place]) 1963    PAST SURGICAL HISTORY: Past Surgical History:  Procedure Laterality Date  . ABDOMINAL HYSTERECTOMY    . APPENDECTOMY    . BREAST  SURGERY     cyst removed from right breast  . EYE SURGERY Bilateral    Cataract Extractions  . REVERSE SHOULDER ARTHROPLASTY Right 10/28/2014   Procedure: REVERSE SHOULDER ARTHROPLASTY;  Surgeon: Corky Mull, MD;  Location: [facility] ORS;  Service: Orthopedics;  Laterality: Right;    FAMILY HISTORY: Reviewed and unchanged. No reported history of malignancy or chronic disease.     ADVANCED DIRECTIVES:    HEALTH MAINTENANCE: Social History  Substance Use Topics  . Smoking status: Former Smoker    Packs/day: 2.00    Types: Cigarettes  . Smokeless tobacco: Never Used  . Alcohol use 0.6 oz/week    1 Glasses of wine per week     Comment: 1 glass of wine daily     Colonoscopy:  PAP:  Bone density:  Lipid panel:  No Known Allergies  Current Outpatient Prescriptions  Medication Sig Dispense Refill  . amLODipine (NORVASC) 10 MG tablet     . cyanocobalamin (,VITAMIN B-12,) 1000 MCG/ML injection Inject into the muscle.    . diphenhydrAMINE (BENADRYL) 50 MG tablet Take 50 mg by mouth at bedtime as needed for itching.    . Ferrous Sulfate (IRON SLOW RELEASE PO) Take by mouth.    . hydrochlorothiazide (HYDRODIURIL) 25 MG tablet Take 25 mg by mouth daily.    Marland Kitchen HYDROcodone-acetaminophen (NORCO/VICODIN) 5-325 MG tablet Take 1-2 tablets by mouth every 4 (four) hours as needed for moderate pain or severe pain. 60 tablet 0  . lisinopril (PRINIVIL,ZESTRIL) 40 MG tablet Take 40 mg by mouth every morning.    . Multiple Vitamins-Minerals (MULTIVITAMIN ADULT PO) Take by mouth.    . SF 1.1 %  GEL dental gel     . tiZANidine (ZANAFLEX) 2 MG tablet Take 1 tablet (2 mg total) by mouth at bedtime as needed for muscle spasms. 30 tablet 0  . traMADol-acetaminophen (ULTRACET) 37.5-325 MG per tablet Take 1 tablet by mouth every 8 (eight) hours as needed.    . hydrALAZINE (APRESOLINE) 25 MG tablet Take by mouth.     No current facility-administered medications for this visit.     OBJECTIVE: Vitals:    01/19/17 0941  BP: (!) 184/69  Pulse: 84  Resp: 18  Temp: 99.1 F (37.3 C)     Body mass index is 21.23 kg/m.    ECOG FS:0 - Asymptomatic  General: Well-developed, well-nourished, no acute distress. Eyes: Pink conjunctiva, anicteric sclera. Lungs: Clear to auscultation bilaterally. Heart: Regular rate and rhythm. No rubs, murmurs, or gallops. Abdomen: Soft, nontender, nondistended. No organomegaly noted, normoactive bowel sounds. Musculoskeletal: No edema, cyanosis, or clubbing. Neuro: Alert, answering all questions appropriately. Cranial nerves grossly intact. Skin: No rashes or petechiae noted. Psych: Normal affect. Lymphatics: No cervical, calvicular, axillary or inguinal LAD.   LAB RESULTS:  Lab Results  Component Value Date   NA 136 10/28/2016   K 3.2 (L) 10/28/2016   CL 100 (L) 10/28/2016   CO2 21 (L) 10/28/2016   GLUCOSE 124 (H) 10/28/2016   BUN 17 10/28/2016   CREATININE 0.72 10/28/2016   CALCIUM 10.6 (H) 10/28/2016   PROT 7.3 03/27/2013   ALBUMIN 4.1 03/27/2013   AST 12 (L) 03/27/2013   ALT 20 03/27/2013   ALKPHOS 87 03/27/2013   BILITOT 0.5 03/27/2013   GFRNONAA >60 10/28/2016   GFRAA >60 10/28/2016    Lab Results  Component Value Date   WBC 4.1 01/19/2017   NEUTROABS 2.4 01/19/2017   HGB 7.9 (L) 01/19/2017   HCT 22.7 (L) 01/19/2017   MCV 107.7 (H) 01/19/2017   PLT 380 01/19/2017   Lab Results  Component Value Date   IRON 193 (H) 12/18/2015   TIBC 380 12/18/2015   IRONPCTSAT 51 (H) 12/18/2015   Lab Results  Component Value Date   FERRITIN 261 12/18/2015     STUDIES: No results found.  ASSESSMENT: MDS 5q-, CLL   PLAN:    1.  CLL: CT scan results from June 09, 2014 were independently reviewed and did not reveal any evidence of recurrent disease. All of her labwork is also either negative or within normal limits. Patient last received single agent Rituxan in February 2015. No treatment is needed at this time. Patient expressed  understanding that she will may need to reinitiate treatment at some point in the future.   2.  Anemia:  Patient's hemoglobin has improved to 7.9 with 1 unit of packed red blood cells approximately 1 month ago, but she is still symptomatic.  After lengthy discussion with the patient, she has declined additional transfusion today. Instead, she will return to clinic in 1 month with repeat laboratory work and further evaluation.   3.  Thrombocytosis: Resolved. Monitor. 4.  MDS, 5q-:  Continue to monitor, no bone marrow biopsy is not needed at this time, plus patient refuses one anyway.  Patient continues to refuse treatment with Revlimid.   Patient expressed understanding and was in agreement with this plan. She also understands that She can call clinic at any time with any questions, concerns, or complaints.   Lloyd Huger, MD   01/20/2017 7:48 AM

## 2017-01-19 ENCOUNTER — Inpatient Hospital Stay: Payer: Medicare Other

## 2017-01-19 ENCOUNTER — Inpatient Hospital Stay: Payer: Medicare Other | Attending: Oncology | Admitting: Oncology

## 2017-01-19 VITALS — BP 184/69 | HR 84 | Temp 99.1°F | Resp 18 | Wt 108.7 lb

## 2017-01-19 DIAGNOSIS — Z8543 Personal history of malignant neoplasm of ovary: Secondary | ICD-10-CM | POA: Diagnosis not present

## 2017-01-19 DIAGNOSIS — Z87891 Personal history of nicotine dependence: Secondary | ICD-10-CM | POA: Diagnosis not present

## 2017-01-19 DIAGNOSIS — D46C Myelodysplastic syndrome with isolated del(5q) chromosomal abnormality: Secondary | ICD-10-CM | POA: Insufficient documentation

## 2017-01-19 DIAGNOSIS — I10 Essential (primary) hypertension: Secondary | ICD-10-CM | POA: Insufficient documentation

## 2017-01-19 DIAGNOSIS — R5381 Other malaise: Secondary | ICD-10-CM | POA: Diagnosis not present

## 2017-01-19 DIAGNOSIS — M1990 Unspecified osteoarthritis, unspecified site: Secondary | ICD-10-CM | POA: Diagnosis not present

## 2017-01-19 DIAGNOSIS — C9110 Chronic lymphocytic leukemia of B-cell type not having achieved remission: Secondary | ICD-10-CM

## 2017-01-19 DIAGNOSIS — C9111 Chronic lymphocytic leukemia of B-cell type in remission: Secondary | ICD-10-CM | POA: Insufficient documentation

## 2017-01-19 DIAGNOSIS — Z9225 Personal history of immunosupression therapy: Secondary | ICD-10-CM | POA: Diagnosis not present

## 2017-01-19 DIAGNOSIS — R790 Abnormal level of blood mineral: Secondary | ICD-10-CM | POA: Insufficient documentation

## 2017-01-19 DIAGNOSIS — R531 Weakness: Secondary | ICD-10-CM | POA: Diagnosis not present

## 2017-01-19 DIAGNOSIS — R5383 Other fatigue: Secondary | ICD-10-CM | POA: Insufficient documentation

## 2017-01-19 DIAGNOSIS — Z7982 Long term (current) use of aspirin: Secondary | ICD-10-CM | POA: Diagnosis not present

## 2017-01-19 DIAGNOSIS — Z79899 Other long term (current) drug therapy: Secondary | ICD-10-CM

## 2017-01-19 LAB — CBC WITH DIFFERENTIAL/PLATELET
Basophils Absolute: 0.1 10*3/uL (ref 0–0.1)
Basophils Relative: 3 %
Eosinophils Absolute: 0.1 10*3/uL (ref 0–0.7)
Eosinophils Relative: 2 %
HCT: 22.7 % — ABNORMAL LOW (ref 35.0–47.0)
Hemoglobin: 7.9 g/dL — ABNORMAL LOW (ref 12.0–16.0)
Lymphocytes Relative: 25 %
Lymphs Abs: 1 10*3/uL (ref 1.0–3.6)
MCH: 37.4 pg — ABNORMAL HIGH (ref 26.0–34.0)
MCHC: 34.7 g/dL (ref 32.0–36.0)
MCV: 107.7 fL — ABNORMAL HIGH (ref 80.0–100.0)
Monocytes Absolute: 0.5 10*3/uL (ref 0.2–0.9)
Monocytes Relative: 12 %
Neutro Abs: 2.4 10*3/uL (ref 1.4–6.5)
Neutrophils Relative %: 58 %
Platelets: 380 10*3/uL (ref 150–440)
RBC: 2.1 MIL/uL — ABNORMAL LOW (ref 3.80–5.20)
RDW: 22.3 % — ABNORMAL HIGH (ref 11.5–14.5)
WBC: 4.1 10*3/uL (ref 3.6–11.0)

## 2017-01-19 LAB — SAMPLE TO BLOOD BANK

## 2017-01-25 ENCOUNTER — Ambulatory Visit
Admission: RE | Admit: 2017-01-25 | Discharge: 2017-01-25 | Disposition: A | Discharge status: Home / Self Care | Payer: Medicare Other | Source: Ambulatory Visit | Attending: Internal Medicine | Admitting: Internal Medicine

## 2017-01-25 ENCOUNTER — Other Ambulatory Visit: Payer: Self-pay | Admitting: Internal Medicine

## 2017-01-25 DIAGNOSIS — M79605 Pain in left leg: Secondary | ICD-10-CM | POA: Diagnosis not present

## 2017-01-27 ENCOUNTER — Other Ambulatory Visit: Payer: Self-pay | Admitting: *Deleted

## 2017-01-27 MED ORDER — HYDROCODONE-ACETAMINOPHEN 5-325 MG PO TABS: 1.0000 | ORAL_TABLET | ORAL | 0 refills | Status: DC | PRN

## 2017-02-13 ENCOUNTER — Encounter (INDEPENDENT_AMBULATORY_CARE_PROVIDER_SITE_OTHER): Payer: Self-pay | Admitting: Vascular Surgery

## 2017-02-13 ENCOUNTER — Ambulatory Visit (INDEPENDENT_AMBULATORY_CARE_PROVIDER_SITE_OTHER): Payer: Medicare Other

## 2017-02-13 ENCOUNTER — Ambulatory Visit (INDEPENDENT_AMBULATORY_CARE_PROVIDER_SITE_OTHER): Payer: Medicare Other | Admitting: Vascular Surgery

## 2017-02-13 VITALS — BP 186/75 | HR 89 | Resp 16 | Ht 60.0 in | Wt 108.0 lb

## 2017-02-13 DIAGNOSIS — I6523 Occlusion and stenosis of bilateral carotid arteries: Secondary | ICD-10-CM | POA: Diagnosis not present

## 2017-02-13 DIAGNOSIS — I10 Essential (primary) hypertension: Secondary | ICD-10-CM

## 2017-02-13 DIAGNOSIS — E785 Hyperlipidemia, unspecified: Secondary | ICD-10-CM

## 2017-02-13 DIAGNOSIS — E119 Type 2 diabetes mellitus without complications: Secondary | ICD-10-CM | POA: Diagnosis not present

## 2017-02-13 NOTE — Progress Notes (Signed)
MRN : [id number]  Jacqueline Russo is a 83 y.o. (Nov 01, 1929) female who presents with chief complaint of  Chief Complaint  Patient presents with  . Follow-up    16mos. carotid  .  History of Present Illness: The patient is seen for follow up evaluation of carotid stenosis. The carotid stenosis followed by ultrasound.   The patient denies amaurosis fugax. There is no recent history of TIA symptoms or focal motor deficits. There is no prior documented CVA.  The patient is taking enteric-coated aspirin 81 mg daily.  There is no history of migraine headaches. There is no history of seizures.  The patient has a history of coronary artery disease, no recent episodes of angina or shortness of breath. The patient denies PAD or claudication symptoms. There is a history of hyperlipidemia which is being treated with a statin.     Current Meds  Medication Sig  . amLODipine (NORVASC) 10 MG tablet   . cyanocobalamin (,VITAMIN B-12,) 1000 MCG/ML injection Inject into the muscle.  . diphenhydrAMINE (BENADRYL) 50 MG tablet Take 50 mg by mouth at bedtime as needed for itching.  . Ferrous Sulfate (IRON SLOW RELEASE PO) Take by mouth.  . hydrochlorothiazide (HYDRODIURIL) 25 MG tablet Take 25 mg by mouth daily.  Marland Kitchen HYDROcodone-acetaminophen (NORCO/VICODIN) 5-325 MG tablet Take 1-2 tablets every 4 (four) hours as needed by mouth for moderate pain or severe pain.  Marland Kitchen lisinopril (PRINIVIL,ZESTRIL) 40 MG tablet Take 40 mg by mouth every morning.  . Multiple Vitamins-Minerals (MULTIVITAMIN ADULT PO) Take by mouth.  . SF 1.1 % GEL dental gel   . tiZANidine (ZANAFLEX) 2 MG tablet Take 1 tablet (2 mg total) by mouth at bedtime as needed for muscle spasms.  . traMADol-acetaminophen (ULTRACET) 37.5-325 MG per tablet Take 1 tablet by mouth every 8 (eight) hours as needed.    Past Medical History:  Diagnosis Date  . Anemia    Myelodysplastic syndrome  . Arthritis   . Hypertension   . Leukemia, chronic  lymphoid (HCC)    CLL  . Ovarian cancer ([place]) 1963    Past Surgical History:  Procedure Laterality Date  . ABDOMINAL HYSTERECTOMY    . APPENDECTOMY    . BREAST SURGERY     cyst removed from right breast  . EYE SURGERY Bilateral    Cataract Extractions  . REVERSE SHOULDER ARTHROPLASTY Right 10/28/2014   Procedure: REVERSE SHOULDER ARTHROPLASTY;  Surgeon: Corky Mull, MD;  Location: [facility] ORS;  Service: Orthopedics;  Laterality: Right;    Social History Social History   Tobacco Use  . Smoking status: Former Smoker    Packs/day: 2.00    Types: Cigarettes  . Smokeless tobacco: Never Used  Substance Use Topics  . Alcohol use: Yes    Alcohol/week: 0.6 oz    Types: 1 Glasses of wine per week    Comment: 1 glass of wine daily  . Drug use: No    Family History History reviewed. No pertinent family history.  No Known Allergies   REVIEW OF SYSTEMS (Negative unless checked)  Constitutional: [] Weight loss  [] Fever  [] Chills Cardiac: [] Chest pain   [] Chest pressure   [] Palpitations   [] Shortness of breath when laying flat   [] Shortness of breath with exertion. Vascular:  [] Pain in legs with walking   [] Pain in legs at rest  [] History of DVT   [] Phlebitis   [] Swelling in legs   [] Varicose veins   [] Non-healing ulcers Pulmonary:   [] Uses home oxygen   []   Productive cough   [] Hemoptysis   [] Wheeze  [] COPD   [] Asthma Neurologic:  [] Dizziness   [] Seizures   [] History of stroke   [] History of TIA  [] Aphasia   [] Vissual changes   [] Weakness or numbness in arm   [] Weakness or numbness in leg Musculoskeletal:   [] Joint swelling   [] Joint pain   [] Low back pain Hematologic:  [] Easy bruising  [] Easy bleeding   [] Hypercoagulable state   [] Anemic Gastrointestinal:  [] Diarrhea   [] Vomiting  [] Gastroesophageal reflux/heartburn   [] Difficulty swallowing. Genitourinary:  [] Chronic kidney disease   [] Difficult urination  [] Frequent urination   [] Blood in urine Skin:  [] Rashes   [] Ulcers    Psychological:  [] History of anxiety   []  History of major depression.  Physical Examination  Vitals:   02/13/17 1139 02/13/17 1140  BP: (!) 192/76 (!) 186/75  Pulse: 89   Resp: 16   Weight: 49 kg (108 lb)   Height: 5' (1.524 m)    Body mass index is 21.09 kg/m. Gen: WD/WN, NAD Head: [place]/AT, No temporalis wasting.  Ear/Nose/Throat: Hearing grossly intact, nares w/o erythema or drainage Eyes: PER, EOMI, sclera nonicteric.  Neck: Supple, no large masses.   Pulmonary:  Good air movement, no audible wheezing bilaterally, no use of accessory muscles.  Cardiac: RRR, no JVD Vascular: bilateral carotid bruits Vessel Right Left  Radial Palpable Palpable  Ulnar Palpable Palpable  Brachial Palpable Palpable  Carotid Palpable Palpable  Gastrointestinal: Non-distended. No guarding/no peritoneal signs.  Musculoskeletal: M/S 5/5 throughout.  No deformity or atrophy.  Neurologic: CN 2-12 intact. Symmetrical.  Speech is fluent. Motor exam as listed above. Psychiatric: Judgment intact, Mood & affect appropriate for pt's clinical situation. Dermatologic: No rashes or ulcers noted.  No changes consistent with cellulitis. Lymph : No lichenification or skin changes of chronic lymphedema.  CBC Lab Results  Component Value Date   WBC 4.1 01/19/2017   HGB 7.9 (L) 01/19/2017   HCT 22.7 (L) 01/19/2017   MCV 107.7 (H) 01/19/2017   PLT 380 01/19/2017    BMET    Component Value Date/Time   NA 136 10/28/2016 1507   NA 140 06/26/2013 1044   K 3.2 (L) 10/28/2016 1507   K 3.7 06/26/2013 1044   CL 100 (L) 10/28/2016 1507   CL 102 06/26/2013 1044   CO2 21 (L) 10/28/2016 1507   CO2 32 06/26/2013 1044   GLUCOSE 124 (H) 10/28/2016 1507   GLUCOSE 121 (H) 06/26/2013 1044   BUN 17 10/28/2016 1507   BUN 16 06/26/2013 1044   CREATININE 0.72 10/28/2016 1507   CREATININE 1.01 01/06/2014 0828   CALCIUM 10.6 (H) 10/28/2016 1507   CALCIUM 9.0 06/26/2013 1044   GFRNONAA >60 10/28/2016 1507   GFRNONAA  56 (L) 01/06/2014 0828   GFRNONAA >60 06/26/2013 1044   GFRAA >60 10/28/2016 1507   GFRAA >60 01/06/2014 0828   GFRAA >60 06/26/2013 1044   CrCl cannot be calculated (Patient's most recent lab result is older than the maximum 21 days allowed.).  COAG Lab Results  Component Value Date   INR 1.02 10/15/2014   INR 1.1 10/21/2012    Radiology US Venous Img Lower Unilateral Left  Result Date: 01/25/2017 CLINICAL DATA:  83 year old female with left lower extremity pain EXAM: LEFT LOWER EXTREMITY VENOUS DOPPLER ULTRASOUND TECHNIQUE: Gray-scale sonography with graded compression, as well as color Doppler and duplex ultrasound were performed to evaluate the lower extremity deep venous systems from the level of the common femoral vein and including  the common femoral, femoral, profunda femoral, popliteal and calf veins including the posterior tibial, peroneal and gastrocnemius veins when visible. The superficial great saphenous vein was also interrogated. Spectral Doppler was utilized to evaluate flow at rest and with distal augmentation maneuvers in the common femoral, femoral and popliteal veins. COMPARISON:  None. FINDINGS: Contralateral Common Femoral Vein: Respiratory phasicity is normal and symmetric with the symptomatic side. No evidence of thrombus. Normal compressibility. Common Femoral Vein: No evidence of thrombus. Normal compressibility, respiratory phasicity and response to augmentation. Saphenofemoral Junction: No evidence of thrombus. Normal compressibility and flow on color Doppler imaging. Profunda Femoral Vein: No evidence of thrombus. Normal compressibility and flow on color Doppler imaging. Femoral Vein: No evidence of thrombus. Normal compressibility, respiratory phasicity and response to augmentation. Popliteal Vein: No evidence of thrombus. Normal compressibility, respiratory phasicity and response to augmentation. Calf Veins: No evidence of thrombus. Normal compressibility and flow on  color Doppler imaging. Superficial Great Saphenous Vein: No evidence of thrombus. Normal compressibility. Venous Reflux:  None. Other Findings: Complex fluid collection originating from the popliteal fossa which is indistinct and challenging to measure. The collection measures approximately 7.3 x 1.1 x 4.3 cm. There is free fluid in the adjacent subcutaneous fat. IMPRESSION: 1. No evidence of deep venous thrombosis. 2. Suspect ruptured Baker cyst in the popliteal fossa extending inferiorly along the posterior aspect of the calf. Electronically Signed   By: Jacqulynn Cadet M.D.   On: 01/25/2017 16:26    Assessment/Plan 1. Bilateral carotid artery stenosis Recommend:  Given the patient's asymptomatic subcritical stenosis no further invasive testing or surgery at this time.  Duplex ultrasound shows 50-69% stenosis bilaterally.  Velocities are increased slightly bilaterally compared to previous study.  Continue antiplatelet therapy as prescribed Continue management of CAD, HTN and Hyperlipidemia Healthy heart diet,  encouraged exercise at least 4 times per week  Follow up in 6 months with duplex ultrasound and physical exam based on >50% stenosis of the bilaterally carotid artery   - VAS US CAROTID; Future  2. Diet-controlled type 2 diabetes mellitus ([place]) Continue hypoglycemic medications as already ordered, these medications have been reviewed and there are no changes at this time.  Hgb A1C to be monitored as already arranged by primary service   3. Hyperlipidemia, unspecified hyperlipidemia type Continue statin as ordered and reviewed, no changes at this time   4. Essential hypertension Continue antihypertensive medications as already ordered, these medications have been reviewed and there are no changes at this time.     Hortencia Pilar, MD  02/13/2017 12:58 PM

## 2017-02-14 NOTE — Progress Notes (Signed)
Jacqueline Russo  [phone] [phone]  ID: Chaney Born OB: 21-Mar-1930  MR#: [id number]  QUI#:[id number]  Patient Care Team: [name], MD as PCP - General (Internal Medicine)  CHIEF COMPLAINT: MDS 5q-, CLL  INTERVAL HISTORY: Patient returns to clinic today for repeat laboratory work evaluation, and consideration of additional blood.  Her weakness and fatigue are essentially unchanged.  She otherwise feels well. She has no neurologic complaints. She denies any recent fevers or illnesses. She denies any night sweats or weight loss. She has no chest pain or shortness of breath. She denies any nausea, vomiting, constipation, or diarrhea. She denies any melena or hematochezia. She has no urinary complaints. Patient offers no further specific complaints today.   REVIEW OF SYSTEMS:   Review of Systems  Constitutional: Positive for malaise/fatigue. Negative for fever and weight loss.  Respiratory: Negative.  Negative for cough and shortness of breath.   Cardiovascular: Negative.  Negative for chest pain and leg swelling.  Gastrointestinal: Negative.  Negative for abdominal pain, blood in stool and melena.  Genitourinary: Negative.   Musculoskeletal: Positive for joint pain.  Skin: Negative.  Negative for rash.  Neurological: Positive for weakness. Negative for sensory change.  Endo/Heme/Allergies: Does not bruise/bleed easily.  Psychiatric/Behavioral: Negative.  The patient is not nervous/anxious.     As per HPI. Otherwise, a complete review of systems is negative.  PAST MEDICAL HISTORY: Past Medical History:  Diagnosis Date  . Anemia    Myelodysplastic syndrome  . Arthritis   . Hypertension   . Leukemia, chronic lymphoid (HCC)    CLL  . Ovarian cancer ([place]) 1963    PAST SURGICAL HISTORY: Past Surgical History:  Procedure Laterality Date  . ABDOMINAL HYSTERECTOMY    . APPENDECTOMY    . BREAST SURGERY     cyst removed from right breast   . EYE SURGERY Bilateral    Cataract Extractions  . REVERSE SHOULDER ARTHROPLASTY Right 10/28/2014   Procedure: REVERSE SHOULDER ARTHROPLASTY;  Surgeon: Corky Mull, MD;  Location: [facility] ORS;  Service: Orthopedics;  Laterality: Right;    FAMILY HISTORY: Reviewed and unchanged. No reported history of malignancy or chronic disease.     ADVANCED DIRECTIVES:    HEALTH MAINTENANCE: Social History   Tobacco Use  . Smoking status: Former Smoker    Packs/day: 2.00    Types: Cigarettes  . Smokeless tobacco: Never Used  Substance Use Topics  . Alcohol use: Yes    Alcohol/week: 0.6 oz    Types: 1 Glasses of wine per week    Comment: 1 glass of wine daily  . Drug use: No     Colonoscopy:  PAP:  Bone density:  Lipid panel:  No Known Allergies  Current Outpatient Medications  Medication Sig Dispense Refill  . amLODipine (NORVASC) 10 MG tablet     . cyanocobalamin (,VITAMIN B-12,) 1000 MCG/ML injection Inject into the muscle.    . diphenhydrAMINE (BENADRYL) 50 MG tablet Take 50 mg by mouth at bedtime as needed for itching.    . Ferrous Sulfate (IRON SLOW RELEASE PO) Take by mouth.    . hydrochlorothiazide (HYDRODIURIL) 25 MG tablet Take 25 mg by mouth daily.    Marland Kitchen HYDROcodone-acetaminophen (NORCO/VICODIN) 5-325 MG tablet Take 1-2 tablets every 4 (four) hours as needed by mouth for moderate pain or severe pain. 60 tablet 0  . lisinopril (PRINIVIL,ZESTRIL) 40 MG tablet Take 40 mg by mouth every morning.    . Multiple Vitamins-Minerals (MULTIVITAMIN ADULT PO)  Take by mouth.    . SF 1.1 % GEL dental gel     . tiZANidine (ZANAFLEX) 2 MG tablet Take 1 tablet (2 mg total) by mouth at bedtime as needed for muscle spasms. 30 tablet 0  . traMADol-acetaminophen (ULTRACET) 37.5-325 MG per tablet Take 1 tablet by mouth every 8 (eight) hours as needed.    Marland Kitchen aspirin 81 MG tablet Take 81 mg by mouth daily.    . hydrALAZINE (APRESOLINE) 25 MG tablet Take by mouth.     No current  facility-administered medications for this visit.     OBJECTIVE: Vitals:   02/17/17 0855  BP: (!) 172/54  Pulse: 91  Resp: 18  Temp: 99 F (37.2 C)     Body mass index is 20.75 kg/m.    ECOG FS:0 - Asymptomatic  General: Well-developed, well-nourished, no acute distress. Eyes: Pink conjunctiva, anicteric sclera. Lungs: Clear to auscultation bilaterally. Heart: Regular rate and rhythm. No rubs, murmurs, or gallops. Abdomen: Soft, nontender, nondistended. No organomegaly noted, normoactive bowel sounds. Musculoskeletal: No edema, cyanosis, or clubbing. Neuro: Alert, answering all questions appropriately. Cranial nerves grossly intact. Skin: No rashes or petechiae noted. Psych: Normal affect.   LAB RESULTS:  Lab Results  Component Value Date   NA 136 10/28/2016   K 3.2 (L) 10/28/2016   CL 100 (L) 10/28/2016   CO2 21 (L) 10/28/2016   GLUCOSE 124 (H) 10/28/2016   BUN 17 10/28/2016   CREATININE 0.72 10/28/2016   CALCIUM 10.6 (H) 10/28/2016   PROT 7.3 03/27/2013   ALBUMIN 4.1 03/27/2013   AST 12 (L) 03/27/2013   ALT 20 03/27/2013   ALKPHOS 87 03/27/2013   BILITOT 0.5 03/27/2013   GFRNONAA >60 10/28/2016   GFRAA >60 10/28/2016    Lab Results  Component Value Date   WBC 4.2 02/15/2017   NEUTROABS 2.5 02/15/2017   HGB 7.6 (L) 02/15/2017   HCT 22.1 (L) 02/15/2017   MCV 109.2 (H) 02/15/2017   PLT 461 (H) 02/15/2017   Lab Results  Component Value Date   IRON 186 (H) 02/15/2017   TIBC 370 02/15/2017   IRONPCTSAT 50 (H) 02/15/2017   Lab Results  Component Value Date   FERRITIN 257 02/15/2017     STUDIES: US Venous Img Lower Unilateral Left  Result Date: 01/25/2017 CLINICAL DATA:  83 year old female with left lower extremity pain EXAM: LEFT LOWER EXTREMITY VENOUS DOPPLER ULTRASOUND TECHNIQUE: Gray-scale sonography with graded compression, as well as color Doppler and duplex ultrasound were performed to evaluate the lower extremity deep venous systems from the  level of the common femoral vein and including the common femoral, femoral, profunda femoral, popliteal and calf veins including the posterior tibial, peroneal and gastrocnemius veins when visible. The superficial great saphenous vein was also interrogated. Spectral Doppler was utilized to evaluate flow at rest and with distal augmentation maneuvers in the common femoral, femoral and popliteal veins. COMPARISON:  None. FINDINGS: Contralateral Common Femoral Vein: Respiratory phasicity is normal and symmetric with the symptomatic side. No evidence of thrombus. Normal compressibility. Common Femoral Vein: No evidence of thrombus. Normal compressibility, respiratory phasicity and response to augmentation. Saphenofemoral Junction: No evidence of thrombus. Normal compressibility and flow on color Doppler imaging. Profunda Femoral Vein: No evidence of thrombus. Normal compressibility and flow on color Doppler imaging. Femoral Vein: No evidence of thrombus. Normal compressibility, respiratory phasicity and response to augmentation. Popliteal Vein: No evidence of thrombus. Normal compressibility, respiratory phasicity and response to augmentation. Calf Veins: No evidence of thrombus.  Normal compressibility and flow on color Doppler imaging. Superficial Great Saphenous Vein: No evidence of thrombus. Normal compressibility. Venous Reflux:  None. Other Findings: Complex fluid collection originating from the popliteal fossa which is indistinct and challenging to measure. The collection measures approximately 7.3 x 1.1 x 4.3 cm. There is free fluid in the adjacent subcutaneous fat. IMPRESSION: 1. No evidence of deep venous thrombosis. 2. Suspect ruptured Baker cyst in the popliteal fossa extending inferiorly along the posterior aspect of the calf. Electronically Signed   By: Jacqulynn Cadet M.D.   On: 01/25/2017 16:26    ASSESSMENT: MDS 5q-, CLL   PLAN:    1.  CLL: CT scan results from June 09, 2014 were independently  reviewed and did not reveal any evidence of recurrent disease. All of her labwork is also either negative or within normal limits. Patient last received single agent Rituxan in February 2015. No treatment is needed at this time. Patient expressed understanding that she will may need to reinitiate treatment at some point in the future.   2.  Anemia:  Patient's hemoglobin is stable at 7.6.  She received 1 unit of packed red blood cells approximately 1 month ago, but she is still symptomatic.  Proceed with additional unit of packed red blood cells today.  Return to clinic in 1 month with repeat laboratory work and further evaluation.   3.  Thrombocytosis: Resolved. Monitor. 4.  MDS, 5q-:  Continue to monitor, no bone marrow biopsy is not needed at this time, plus patient refuses one anyway.  Patient continues to refuse treatment with Revlimid. 5.  Elevated iron stores: Patient will likely require IV Desferal in the future.  Patient expressed understanding and was in agreement with this plan. She also understands that She can call clinic at any time with any questions, concerns, or complaints.   Lloyd Huger, MD   02/19/2017 8:29 AM

## 2017-02-15 ENCOUNTER — Inpatient Hospital Stay: Payer: Medicare Other

## 2017-02-15 DIAGNOSIS — C9111 Chronic lymphocytic leukemia of B-cell type in remission: Secondary | ICD-10-CM | POA: Diagnosis not present

## 2017-02-15 DIAGNOSIS — D46C Myelodysplastic syndrome with isolated del(5q) chromosomal abnormality: Secondary | ICD-10-CM

## 2017-02-15 LAB — IRON AND TIBC
Iron: 186 ug/dL — ABNORMAL HIGH (ref 28–170)
Saturation Ratios: 50 % — ABNORMAL HIGH (ref 10.4–31.8)
TIBC: 370 ug/dL (ref 250–450)
UIBC: 184 ug/dL

## 2017-02-15 LAB — CBC WITH DIFFERENTIAL/PLATELET
Basophils Absolute: 0.1 10*3/uL (ref 0–0.1)
Basophils Relative: 4 %
Eosinophils Absolute: 0.1 10*3/uL (ref 0–0.7)
Eosinophils Relative: 2 %
HCT: 22.1 % — ABNORMAL LOW (ref 35.0–47.0)
Hemoglobin: 7.6 g/dL — ABNORMAL LOW (ref 12.0–16.0)
Lymphocytes Relative: 23 %
Lymphs Abs: 0.9 10*3/uL — ABNORMAL LOW (ref 1.0–3.6)
MCH: 37.6 pg — ABNORMAL HIGH (ref 26.0–34.0)
MCHC: 34.4 g/dL (ref 32.0–36.0)
MCV: 109.2 fL — ABNORMAL HIGH (ref 80.0–100.0)
Monocytes Absolute: 0.5 10*3/uL (ref 0.2–0.9)
Monocytes Relative: 12 %
Neutro Abs: 2.5 10*3/uL (ref 1.4–6.5)
Neutrophils Relative %: 59 %
Platelets: 461 10*3/uL — ABNORMAL HIGH (ref 150–440)
RBC: 2.03 MIL/uL — ABNORMAL LOW (ref 3.80–5.20)
RDW: 22.9 % — ABNORMAL HIGH (ref 11.5–14.5)
WBC: 4.2 10*3/uL (ref 3.6–11.0)

## 2017-02-15 LAB — FERRITIN: Ferritin: 257 ng/mL (ref 11–307)

## 2017-02-15 LAB — SAMPLE TO BLOOD BANK

## 2017-02-16 ENCOUNTER — Other Ambulatory Visit: Payer: Self-pay | Admitting: *Deleted

## 2017-02-16 ENCOUNTER — Other Ambulatory Visit: Payer: Self-pay | Admitting: Oncology

## 2017-02-16 DIAGNOSIS — D649 Anemia, unspecified: Secondary | ICD-10-CM

## 2017-02-16 LAB — PREPARE RBC (CROSSMATCH)

## 2017-02-17 ENCOUNTER — Inpatient Hospital Stay: Payer: Medicare Other

## 2017-02-17 ENCOUNTER — Inpatient Hospital Stay (HOSPITAL_BASED_OUTPATIENT_CLINIC_OR_DEPARTMENT_OTHER): Payer: Medicare Other | Admitting: Oncology

## 2017-02-17 VITALS — BP 172/54 | HR 91 | Temp 99.0°F | Resp 18 | Wt 106.3 lb

## 2017-02-17 DIAGNOSIS — R531 Weakness: Secondary | ICD-10-CM

## 2017-02-17 DIAGNOSIS — I10 Essential (primary) hypertension: Secondary | ICD-10-CM

## 2017-02-17 DIAGNOSIS — D46C Myelodysplastic syndrome with isolated del(5q) chromosomal abnormality: Secondary | ICD-10-CM

## 2017-02-17 DIAGNOSIS — Z7982 Long term (current) use of aspirin: Secondary | ICD-10-CM

## 2017-02-17 DIAGNOSIS — C9111 Chronic lymphocytic leukemia of B-cell type in remission: Secondary | ICD-10-CM

## 2017-02-17 DIAGNOSIS — Z9225 Personal history of immunosupression therapy: Secondary | ICD-10-CM

## 2017-02-17 DIAGNOSIS — R790 Abnormal level of blood mineral: Secondary | ICD-10-CM | POA: Diagnosis not present

## 2017-02-17 DIAGNOSIS — C9110 Chronic lymphocytic leukemia of B-cell type not having achieved remission: Secondary | ICD-10-CM

## 2017-02-17 DIAGNOSIS — Z87891 Personal history of nicotine dependence: Secondary | ICD-10-CM

## 2017-02-17 DIAGNOSIS — M1990 Unspecified osteoarthritis, unspecified site: Secondary | ICD-10-CM

## 2017-02-17 DIAGNOSIS — Z79899 Other long term (current) drug therapy: Secondary | ICD-10-CM | POA: Diagnosis not present

## 2017-02-17 DIAGNOSIS — R5383 Other fatigue: Secondary | ICD-10-CM | POA: Diagnosis not present

## 2017-02-17 DIAGNOSIS — R5381 Other malaise: Secondary | ICD-10-CM | POA: Diagnosis not present

## 2017-02-17 DIAGNOSIS — D649 Anemia, unspecified: Secondary | ICD-10-CM

## 2017-02-17 DIAGNOSIS — Z8543 Personal history of malignant neoplasm of ovary: Secondary | ICD-10-CM | POA: Diagnosis not present

## 2017-02-17 MED ORDER — SODIUM CHLORIDE 0.9 % IV SOLN
250.0000 mL | Freq: Once | INTRAVENOUS | Status: AC
  Administered 2017-02-17: 250 mL via INTRAVENOUS
  Filled 2017-02-17: qty 250

## 2017-02-17 MED ORDER — DIPHENHYDRAMINE HCL 50 MG/ML IJ SOLN
25.0000 mg | Freq: Once | INTRAMUSCULAR | Status: AC
  Administered 2017-02-17: 25 mg via INTRAVENOUS
  Filled 2017-02-17: qty 1

## 2017-02-17 MED ORDER — ACETAMINOPHEN 325 MG PO TABS
650.0000 mg | ORAL_TABLET | Freq: Once | ORAL | Status: AC
  Administered 2017-02-17: 650 mg via ORAL
  Filled 2017-02-17: qty 2

## 2017-02-17 NOTE — Progress Notes (Signed)
Patient is here today for follow up, she is doing well overall but mentions her appetite is not so good but still having 1 bowel movement daily.

## 2017-02-18 LAB — BPAM RBC
Blood Product Expiration Date: 201812102359
ISSUE DATE / TIME: 201811301002
Unit Type and Rh: 6200

## 2017-02-18 LAB — TYPE AND SCREEN
ABO/RH(D): AB POS
Antibody Screen: NEGATIVE
Unit division: 0

## 2017-02-22 DIAGNOSIS — G609 Hereditary and idiopathic neuropathy, unspecified: Secondary | ICD-10-CM | POA: Insufficient documentation

## 2017-02-28 ENCOUNTER — Other Ambulatory Visit: Payer: Self-pay | Admitting: *Deleted

## 2017-02-28 MED ORDER — HYDROCODONE-ACETAMINOPHEN 5-325 MG PO TABS: 1.0000 | ORAL_TABLET | ORAL | 0 refills | Status: DC | PRN

## 2017-03-15 ENCOUNTER — Other Ambulatory Visit: Payer: Medicare Other

## 2017-03-16 ENCOUNTER — Inpatient Hospital Stay: Payer: Medicare Other | Attending: Oncology

## 2017-03-16 DIAGNOSIS — C9110 Chronic lymphocytic leukemia of B-cell type not having achieved remission: Secondary | ICD-10-CM

## 2017-03-16 DIAGNOSIS — D46C Myelodysplastic syndrome with isolated del(5q) chromosomal abnormality: Secondary | ICD-10-CM | POA: Diagnosis not present

## 2017-03-16 DIAGNOSIS — Z9225 Personal history of immunosupression therapy: Secondary | ICD-10-CM | POA: Diagnosis not present

## 2017-03-16 DIAGNOSIS — C9111 Chronic lymphocytic leukemia of B-cell type in remission: Secondary | ICD-10-CM | POA: Insufficient documentation

## 2017-03-16 LAB — CBC WITH DIFFERENTIAL/PLATELET
Basophils Absolute: 0.1 10*3/uL (ref 0–0.1)
Basophils Relative: 3 %
Eosinophils Absolute: 0.1 10*3/uL (ref 0–0.7)
Eosinophils Relative: 2 %
HCT: 23 % — ABNORMAL LOW (ref 35.0–47.0)
Hemoglobin: 7.9 g/dL — ABNORMAL LOW (ref 12.0–16.0)
Lymphocytes Relative: 18 %
Lymphs Abs: 0.8 10*3/uL — ABNORMAL LOW (ref 1.0–3.6)
MCH: 37.3 pg — ABNORMAL HIGH (ref 26.0–34.0)
MCHC: 34.3 g/dL (ref 32.0–36.0)
MCV: 108.5 fL — ABNORMAL HIGH (ref 80.0–100.0)
Monocytes Absolute: 0.4 10*3/uL (ref 0.2–0.9)
Monocytes Relative: 9 %
Neutro Abs: 3.1 10*3/uL (ref 1.4–6.5)
Neutrophils Relative %: 68 %
Platelets: 437 10*3/uL (ref 150–440)
RBC: 2.12 MIL/uL — ABNORMAL LOW (ref 3.80–5.20)
RDW: 21.1 % — ABNORMAL HIGH (ref 11.5–14.5)
WBC: 4.6 10*3/uL (ref 3.6–11.0)

## 2017-03-16 LAB — BASIC METABOLIC PANEL
Anion gap: 11 (ref 5–15)
BUN: 19 mg/dL (ref 6–20)
CO2: 22 mmol/L (ref 22–32)
Calcium: 9.5 mg/dL (ref 8.9–10.3)
Chloride: 97 mmol/L — ABNORMAL LOW (ref 101–111)
Creatinine, Ser: 0.9 mg/dL (ref 0.44–1.00)
GFR calc Af Amer: >60 mL/min (ref >60)
GFR calc non Af Amer: 57 mL/min — ABNORMAL LOW (ref >60)
Glucose, Bld: 147 mg/dL — ABNORMAL HIGH (ref 65–99)
Potassium: 3.3 mmol/L — ABNORMAL LOW (ref 3.5–5.1)
Sodium: 130 mmol/L — ABNORMAL LOW (ref 135–145)

## 2017-03-16 LAB — SAMPLE TO BLOOD BANK

## 2017-03-16 NOTE — Progress Notes (Deleted)
[place]  [phone]) [phone] [phone]) [phone]  ID: Jacqueline Russo OB: 1929/11/12  MR#: [id number]  EXB#:[id number]  Patient Care Team: [name], MD as PCP - General (Internal Medicine)  CHIEF COMPLAINT: MDS 5q-, CLL  INTERVAL HISTORY: Patient returns to clinic today for repeat laboratory work evaluation, and consideration of additional blood.  Her weakness and fatigue are essentially unchanged.  She otherwise feels well. She has no neurologic complaints. She denies any recent fevers or illnesses. She denies any night sweats or weight loss. She has no chest pain or shortness of breath. She denies any nausea, vomiting, constipation, or diarrhea. She denies any melena or hematochezia. She has no urinary complaints. Patient offers no further specific complaints today.   REVIEW OF SYSTEMS:   Review of Systems  Constitutional: Positive for malaise/fatigue. Negative for fever and weight loss.  Respiratory: Negative.  Negative for cough and shortness of breath.   Cardiovascular: Negative.  Negative for chest pain and leg swelling.  Gastrointestinal: Negative.  Negative for abdominal pain, blood in stool and melena.  Genitourinary: Negative.   Musculoskeletal: Positive for joint pain.  Skin: Negative.  Negative for rash.  Neurological: Positive for weakness. Negative for sensory change.  Endo/Heme/Allergies: Does not bruise/bleed easily.  Psychiatric/Behavioral: Negative.  The patient is not nervous/anxious.     As per HPI. Otherwise, a complete review of systems is negative.  PAST MEDICAL HISTORY: Past Medical History:  Diagnosis Date  . Anemia    Myelodysplastic syndrome  . Arthritis   . Hypertension   . Leukemia, chronic lymphoid (HCC)    CLL  . Ovarian cancer ([place]) 1963    PAST SURGICAL HISTORY: Past Surgical History:  Procedure Laterality Date  . ABDOMINAL HYSTERECTOMY    . APPENDECTOMY    . BREAST SURGERY     cyst removed from right breast   . EYE SURGERY Bilateral    Cataract Extractions  . REVERSE SHOULDER ARTHROPLASTY Right 10/28/2014   Procedure: REVERSE SHOULDER ARTHROPLASTY;  Surgeon: Corky Mull, MD;  Location: [facility] ORS;  Service: Orthopedics;  Laterality: Right;    FAMILY HISTORY: Reviewed and unchanged. No reported history of malignancy or chronic disease.     ADVANCED DIRECTIVES:    HEALTH MAINTENANCE: Social History   Tobacco Use  . Smoking status: Former Smoker    Packs/day: 2.00    Types: Cigarettes  . Smokeless tobacco: Never Used  Substance Use Topics  . Alcohol use: Yes    Alcohol/week: 0.6 oz    Types: 1 Glasses of wine per week    Comment: 1 glass of wine daily  . Drug use: No     Colonoscopy:  PAP:  Bone density:  Lipid panel:  No Known Allergies  Current Outpatient Medications  Medication Sig Dispense Refill  . amLODipine (NORVASC) 10 MG tablet     . aspirin 81 MG tablet Take 81 mg by mouth daily.    . cyanocobalamin (,VITAMIN B-12,) 1000 MCG/ML injection Inject into the muscle.    . diphenhydrAMINE (BENADRYL) 50 MG tablet Take 50 mg by mouth at bedtime as needed for itching.    . Ferrous Sulfate (IRON SLOW RELEASE PO) Take by mouth.    . hydrALAZINE (APRESOLINE) 25 MG tablet Take by mouth.    . hydrochlorothiazide (HYDRODIURIL) 25 MG tablet Take 25 mg by mouth daily.    Marland Kitchen HYDROcodone-acetaminophen (NORCO/VICODIN) 5-325 MG tablet Take 1-2 tablets by mouth every 4 (four) hours as needed for moderate pain or severe pain.  60 tablet 0  . lisinopril (PRINIVIL,ZESTRIL) 40 MG tablet Take 40 mg by mouth every morning.    . Multiple Vitamins-Minerals (MULTIVITAMIN ADULT PO) Take by mouth.    . SF 1.1 % GEL dental gel     . tiZANidine (ZANAFLEX) 2 MG tablet Take 1 tablet (2 mg total) by mouth at bedtime as needed for muscle spasms. 30 tablet 0  . traMADol-acetaminophen (ULTRACET) 37.5-325 MG per tablet Take 1 tablet by mouth every 8 (eight) hours as needed.     No current  facility-administered medications for this visit.     OBJECTIVE: There were no vitals filed for this visit.   There is no height or weight on file to calculate BMI.    ECOG FS:0 - Asymptomatic  General: Well-developed, well-nourished, no acute distress. Eyes: Pink conjunctiva, anicteric sclera. Lungs: Clear to auscultation bilaterally. Heart: Regular rate and rhythm. No rubs, murmurs, or gallops. Abdomen: Soft, nontender, nondistended. No organomegaly noted, normoactive bowel sounds. Musculoskeletal: No edema, cyanosis, or clubbing. Neuro: Alert, answering all questions appropriately. Cranial nerves grossly intact. Skin: No rashes or petechiae noted. Psych: Normal affect.   LAB RESULTS:  Lab Results  Component Value Date   NA 136 10/28/2016   K 3.2 (L) 10/28/2016   CL 100 (L) 10/28/2016   CO2 21 (L) 10/28/2016   GLUCOSE 124 (H) 10/28/2016   BUN 17 10/28/2016   CREATININE 0.72 10/28/2016   CALCIUM 10.6 (H) 10/28/2016   PROT 7.3 03/27/2013   ALBUMIN 4.1 03/27/2013   AST 12 (L) 03/27/2013   ALT 20 03/27/2013   ALKPHOS 87 03/27/2013   BILITOT 0.5 03/27/2013   GFRNONAA >60 10/28/2016   GFRAA >60 10/28/2016    Lab Results  Component Value Date   WBC 4.2 02/15/2017   NEUTROABS 2.5 02/15/2017   HGB 7.6 (L) 02/15/2017   HCT 22.1 (L) 02/15/2017   MCV 109.2 (H) 02/15/2017   PLT 461 (H) 02/15/2017   Lab Results  Component Value Date   IRON 186 (H) 02/15/2017   TIBC 370 02/15/2017   IRONPCTSAT 50 (H) 02/15/2017   Lab Results  Component Value Date   FERRITIN 257 02/15/2017     STUDIES: No results found.  ASSESSMENT: MDS 5q-, CLL   PLAN:    1.  CLL: CT scan results from June 09, 2014 were independently reviewed and did not reveal any evidence of recurrent disease. All of her labwork is also either negative or within normal limits. Patient last received single agent Rituxan in February 2015. No treatment is needed at this time. Patient expressed understanding that  she will may need to reinitiate treatment at some point in the future.   2.  Anemia:  Patient's hemoglobin is stable at 7.6.  She received 1 unit of packed red blood cells approximately 1 month ago, but she is still symptomatic.  Proceed with additional unit of packed red blood cells today.  Return to clinic in 1 month with repeat laboratory work and further evaluation.   3.  Thrombocytosis: Resolved. Monitor. 4.  MDS, 5q-:  Continue to monitor, no bone marrow biopsy is not needed at this time, plus patient refuses one anyway.  Patient continues to refuse treatment with Revlimid. 5.  Elevated iron stores: Patient will likely require IV Desferal in the future.  Patient expressed understanding and was in agreement with this plan. She also understands that She can call clinic at any time with any questions, concerns, or complaints.   Lloyd Huger, MD  03/16/2017 7:37 AM

## 2017-03-17 ENCOUNTER — Inpatient Hospital Stay: Payer: Medicare Other

## 2017-03-17 ENCOUNTER — Inpatient Hospital Stay: Payer: Medicare Other | Admitting: Oncology

## 2017-03-17 LAB — IGG, IGA, IGM
IgA: 191 mg/dL (ref 64–422)
IgG (Immunoglobin G), Serum: 720 mg/dL (ref 700–1600)
IgM (Immunoglobulin M), Srm: 89 mg/dL (ref 26–217)

## 2017-03-17 LAB — PROTEIN ELECTROPHORESIS, SERUM
A/G Ratio: 1.6 (ref 0.7–1.7)
Albumin ELP: 4.4 g/dL (ref 2.9–4.4)
Alpha-1-Globulin: 0.3 g/dL (ref 0.0–0.4)
Alpha-2-Globulin: 0.6 g/dL (ref 0.4–1.0)
Beta Globulin: 1 g/dL (ref 0.7–1.3)
Gamma Globulin: 0.9 g/dL (ref 0.4–1.8)
Globulin, Total: 2.7 g/dL (ref 2.2–3.9)
Total Protein ELP: 7.1 g/dL (ref 6.0–8.5)

## 2017-03-17 LAB — KAPPA/LAMBDA LIGHT CHAINS
Kappa free light chain: 14.1 mg/L (ref 3.3–19.4)
Kappa, lambda light chain ratio: 0.68 (ref 0.26–1.65)
Lambda free light chains: 20.6 mg/L (ref 5.7–26.3)

## 2017-03-22 ENCOUNTER — Inpatient Hospital Stay: Payer: Medicare Other | Attending: Oncology

## 2017-03-22 ENCOUNTER — Other Ambulatory Visit: Payer: Self-pay

## 2017-03-22 DIAGNOSIS — Z7982 Long term (current) use of aspirin: Secondary | ICD-10-CM | POA: Insufficient documentation

## 2017-03-22 DIAGNOSIS — I10 Essential (primary) hypertension: Secondary | ICD-10-CM | POA: Diagnosis not present

## 2017-03-22 DIAGNOSIS — D46C Myelodysplastic syndrome with isolated del(5q) chromosomal abnormality: Secondary | ICD-10-CM | POA: Diagnosis not present

## 2017-03-22 DIAGNOSIS — D649 Anemia, unspecified: Secondary | ICD-10-CM | POA: Insufficient documentation

## 2017-03-22 DIAGNOSIS — R5382 Chronic fatigue, unspecified: Secondary | ICD-10-CM | POA: Diagnosis not present

## 2017-03-22 DIAGNOSIS — R5381 Other malaise: Secondary | ICD-10-CM | POA: Diagnosis not present

## 2017-03-22 DIAGNOSIS — C9111 Chronic lymphocytic leukemia of B-cell type in remission: Secondary | ICD-10-CM | POA: Insufficient documentation

## 2017-03-22 DIAGNOSIS — Z87891 Personal history of nicotine dependence: Secondary | ICD-10-CM | POA: Diagnosis not present

## 2017-03-22 DIAGNOSIS — M1990 Unspecified osteoarthritis, unspecified site: Secondary | ICD-10-CM | POA: Diagnosis not present

## 2017-03-22 DIAGNOSIS — Z79899 Other long term (current) drug therapy: Secondary | ICD-10-CM | POA: Diagnosis not present

## 2017-03-22 DIAGNOSIS — Z8543 Personal history of malignant neoplasm of ovary: Secondary | ICD-10-CM | POA: Diagnosis not present

## 2017-03-22 DIAGNOSIS — R531 Weakness: Secondary | ICD-10-CM | POA: Diagnosis not present

## 2017-03-22 DIAGNOSIS — C9110 Chronic lymphocytic leukemia of B-cell type not having achieved remission: Secondary | ICD-10-CM

## 2017-03-22 DIAGNOSIS — Z9225 Personal history of immunosupression therapy: Secondary | ICD-10-CM | POA: Insufficient documentation

## 2017-03-22 LAB — CBC WITH DIFFERENTIAL/PLATELET
Basophils Absolute: 0.1 10*3/uL (ref 0–0.1)
Basophils Relative: 2 %
Eosinophils Absolute: 0 10*3/uL (ref 0–0.7)
Eosinophils Relative: 1 %
HCT: 23.5 % — ABNORMAL LOW (ref 35.0–47.0)
Hemoglobin: 8.1 g/dL — ABNORMAL LOW (ref 12.0–16.0)
Lymphocytes Relative: 11 %
Lymphs Abs: 0.4 10*3/uL — ABNORMAL LOW (ref 1.0–3.6)
MCH: 37.1 pg — ABNORMAL HIGH (ref 26.0–34.0)
MCHC: 34.4 g/dL (ref 32.0–36.0)
MCV: 108 fL — ABNORMAL HIGH (ref 80.0–100.0)
Monocytes Absolute: 0.4 10*3/uL (ref 0.2–0.9)
Monocytes Relative: 12 %
Neutro Abs: 2.8 10*3/uL (ref 1.4–6.5)
Neutrophils Relative %: 74 %
Platelets: 418 10*3/uL (ref 150–440)
RBC: 2.18 MIL/uL — ABNORMAL LOW (ref 3.80–5.20)
RDW: 20.9 % — ABNORMAL HIGH (ref 11.5–14.5)
WBC: 3.7 10*3/uL (ref 3.6–11.0)

## 2017-03-22 LAB — TYPE AND SCREEN
ABO/RH(D): AB POS
Antibody Screen: NEGATIVE

## 2017-03-22 LAB — SAMPLE TO BLOOD BANK

## 2017-03-22 NOTE — Progress Notes (Deleted)
[place]  [phone]) [phone] [phone]) [phone]  ID: Chaney Born OB: Aug 13, 1929  MR#: [id number]  OVZ#:[id number]  Patient Care Team: [name], MD as PCP - General (Internal Medicine)  CHIEF COMPLAINT: MDS 5q-, CLL  INTERVAL HISTORY: Patient returns to clinic today for repeat laboratory work evaluation, and consideration of additional blood.  Her weakness and fatigue are essentially unchanged.  She otherwise feels well. She has no neurologic complaints. She denies any recent fevers or illnesses. She denies any night sweats or weight loss. She has no chest pain or shortness of breath. She denies any nausea, vomiting, constipation, or diarrhea. She denies any melena or hematochezia. She has no urinary complaints. Patient offers no further specific complaints today.   REVIEW OF SYSTEMS:   Review of Systems  Constitutional: Positive for malaise/fatigue. Negative for fever and weight loss.  Respiratory: Negative.  Negative for cough and shortness of breath.   Cardiovascular: Negative.  Negative for chest pain and leg swelling.  Gastrointestinal: Negative.  Negative for abdominal pain, blood in stool and melena.  Genitourinary: Negative.   Musculoskeletal: Positive for joint pain.  Skin: Negative.  Negative for rash.  Neurological: Positive for weakness. Negative for sensory change.  Endo/Heme/Allergies: Does not bruise/bleed easily.  Psychiatric/Behavioral: Negative.  The patient is not nervous/anxious.     As per HPI. Otherwise, a complete review of systems is negative.  PAST MEDICAL HISTORY: Past Medical History:  Diagnosis Date  . Anemia    Myelodysplastic syndrome  . Arthritis   . Hypertension   . Leukemia, chronic lymphoid (HCC)    CLL  . Ovarian cancer ([place]) 1963    PAST SURGICAL HISTORY: Past Surgical History:  Procedure Laterality Date  . ABDOMINAL HYSTERECTOMY    . APPENDECTOMY    . BREAST SURGERY     cyst removed from right breast   . EYE SURGERY Bilateral    Cataract Extractions  . REVERSE SHOULDER ARTHROPLASTY Right 10/28/2014   Procedure: REVERSE SHOULDER ARTHROPLASTY;  Surgeon: Corky Mull, MD;  Location: [facility] ORS;  Service: Orthopedics;  Laterality: Right;    FAMILY HISTORY: Reviewed and unchanged. No reported history of malignancy or chronic disease.     ADVANCED DIRECTIVES:    HEALTH MAINTENANCE: Social History   Tobacco Use  . Smoking status: Former Smoker    Packs/day: 2.00    Types: Cigarettes  . Smokeless tobacco: Never Used  Substance Use Topics  . Alcohol use: Yes    Alcohol/week: 0.6 oz    Types: 1 Glasses of wine per week    Comment: 1 glass of wine daily  . Drug use: No     Colonoscopy:  PAP:  Bone density:  Lipid panel:  No Known Allergies  Current Outpatient Medications  Medication Sig Dispense Refill  . amLODipine (NORVASC) 10 MG tablet     . aspirin 81 MG tablet Take 81 mg by mouth daily.    . cyanocobalamin (,VITAMIN B-12,) 1000 MCG/ML injection Inject into the muscle.    . diphenhydrAMINE (BENADRYL) 50 MG tablet Take 50 mg by mouth at bedtime as needed for itching.    . Ferrous Sulfate (IRON SLOW RELEASE PO) Take by mouth.    . hydrALAZINE (APRESOLINE) 25 MG tablet Take by mouth.    . hydrochlorothiazide (HYDRODIURIL) 25 MG tablet Take 25 mg by mouth daily.    Marland Kitchen HYDROcodone-acetaminophen (NORCO/VICODIN) 5-325 MG tablet Take 1-2 tablets by mouth every 4 (four) hours as needed for moderate pain or severe pain.  60 tablet 0  . lisinopril (PRINIVIL,ZESTRIL) 40 MG tablet Take 40 mg by mouth every morning.    . Multiple Vitamins-Minerals (MULTIVITAMIN ADULT PO) Take by mouth.    . SF 1.1 % GEL dental gel     . tiZANidine (ZANAFLEX) 2 MG tablet Take 1 tablet (2 mg total) by mouth at bedtime as needed for muscle spasms. 30 tablet 0  . traMADol-acetaminophen (ULTRACET) 37.5-325 MG per tablet Take 1 tablet by mouth every 8 (eight) hours as needed.     No current  facility-administered medications for this visit.     OBJECTIVE: There were no vitals filed for this visit.   There is no height or weight on file to calculate BMI.    ECOG FS:0 - Asymptomatic  General: Well-developed, well-nourished, no acute distress. Eyes: Pink conjunctiva, anicteric sclera. Lungs: Clear to auscultation bilaterally. Heart: Regular rate and rhythm. No rubs, murmurs, or gallops. Abdomen: Soft, nontender, nondistended. No organomegaly noted, normoactive bowel sounds. Musculoskeletal: No edema, cyanosis, or clubbing. Neuro: Alert, answering all questions appropriately. Cranial nerves grossly intact. Skin: No rashes or petechiae noted. Psych: Normal affect.   LAB RESULTS:  Lab Results  Component Value Date   NA 130 (L) 03/16/2017   K 3.3 (L) 03/16/2017   CL 97 (L) 03/16/2017   CO2 22 03/16/2017   GLUCOSE 147 (H) 03/16/2017   BUN 19 03/16/2017   CREATININE 0.90 03/16/2017   CALCIUM 9.5 03/16/2017   PROT 7.3 03/27/2013   ALBUMIN 4.1 03/27/2013   AST 12 (L) 03/27/2013   ALT 20 03/27/2013   ALKPHOS 87 03/27/2013   BILITOT 0.5 03/27/2013   GFRNONAA 57 (L) 03/16/2017   GFRAA >60 03/16/2017    Lab Results  Component Value Date   WBC 3.7 03/22/2017   NEUTROABS 2.8 03/22/2017   HGB 8.1 (L) 03/22/2017   HCT 23.5 (L) 03/22/2017   MCV 108.0 (H) 03/22/2017   PLT 418 03/22/2017   Lab Results  Component Value Date   IRON 186 (H) 02/15/2017   TIBC 370 02/15/2017   IRONPCTSAT 50 (H) 02/15/2017   Lab Results  Component Value Date   FERRITIN 257 02/15/2017     STUDIES: No results found.  ASSESSMENT: MDS 5q-, CLL   PLAN:    1.  CLL: CT scan results from June 09, 2014 were independently reviewed and did not reveal any evidence of recurrent disease. All of her labwork is also either negative or within normal limits. Patient last received single agent Rituxan in February 2015. No treatment is needed at this time. Patient expressed understanding that she  will may need to reinitiate treatment at some point in the future.   2.  Anemia:  Patient's hemoglobin is stable at 7.6.  She received 1 unit of packed red blood cells approximately 1 month ago, but she is still symptomatic.  Proceed with additional unit of packed red blood cells today.  Return to clinic in 1 month with repeat laboratory work and further evaluation.   3.  Thrombocytosis: Resolved. Monitor. 4.  MDS, 5q-:  Continue to monitor, no bone marrow biopsy is not needed at this time, plus patient refuses one anyway.  Patient continues to refuse treatment with Revlimid. 5.  Elevated iron stores: Patient will likely require IV Desferal in the future.  Patient expressed understanding and was in agreement with this plan. She also understands that She can call clinic at any time with any questions, concerns, or complaints.   Lloyd Huger, MD  03/22/2017 11:02 AM

## 2017-03-24 ENCOUNTER — Ambulatory Visit: Payer: Medicare Other | Admitting: Oncology

## 2017-03-24 ENCOUNTER — Ambulatory Visit: Payer: Medicare Other

## 2017-03-29 ENCOUNTER — Other Ambulatory Visit: Payer: Self-pay | Admitting: *Deleted

## 2017-03-29 MED ORDER — HYDROCODONE-ACETAMINOPHEN 5-325 MG PO TABS: 1.0000 | ORAL_TABLET | ORAL | 0 refills | Status: DC | PRN

## 2017-04-06 ENCOUNTER — Other Ambulatory Visit: Payer: Self-pay | Admitting: *Deleted

## 2017-04-06 DIAGNOSIS — D649 Anemia, unspecified: Secondary | ICD-10-CM

## 2017-04-10 NOTE — Progress Notes (Signed)
[place]  [phone]) [phone] Fax:(336) [phone]  ID: Jacqueline Russo OB: 1929-05-03  MR#: [id number]  URK#:[id number]  Patient Care Team: [name], MD as PCP - General (Internal Medicine)  CHIEF COMPLAINT: MDS 5q-, CLL  INTERVAL HISTORY: Patient returns to clinic today for repeat laboratory work evaluation, and consideration of additional blood.  She continues to be active, but complains of chronic weakness and fatigue.  She otherwise feels well. She has no neurologic complaints. She denies any recent fevers or illnesses. She denies any night sweats or weight loss. She has no chest pain or shortness of breath. She denies any nausea, vomiting, constipation, or diarrhea. She denies any melena or hematochezia. She has no urinary complaints. Patient offers no further specific complaints today.   REVIEW OF SYSTEMS:   Review of Systems  Constitutional: Positive for malaise/fatigue. Negative for fever and weight loss.  Respiratory: Negative.  Negative for cough and shortness of breath.   Cardiovascular: Negative.  Negative for chest pain and leg swelling.  Gastrointestinal: Negative.  Negative for abdominal pain, blood in stool and melena.  Genitourinary: Negative.   Musculoskeletal: Positive for joint pain.  Skin: Negative.  Negative for rash.  Neurological: Positive for weakness. Negative for sensory change.  Endo/Heme/Allergies: Does not bruise/bleed easily.  Psychiatric/Behavioral: Negative.  The patient is not nervous/anxious.     As per HPI. Otherwise, a complete review of systems is negative.  PAST MEDICAL HISTORY: Past Medical History:  Diagnosis Date  . Anemia    Myelodysplastic syndrome  . Arthritis   . Hypertension   . Leukemia, chronic lymphoid (HCC)    CLL  . Ovarian cancer ([place]) 1963    PAST SURGICAL HISTORY: Past Surgical History:  Procedure Laterality Date  . ABDOMINAL HYSTERECTOMY    . APPENDECTOMY    . BREAST SURGERY     cyst  removed from right breast  . EYE SURGERY Bilateral    Cataract Extractions  . REVERSE SHOULDER ARTHROPLASTY Right 10/28/2014   Procedure: REVERSE SHOULDER ARTHROPLASTY;  Surgeon: Corky Mull, MD;  Location: [facility] ORS;  Service: Orthopedics;  Laterality: Right;    FAMILY HISTORY: Reviewed and unchanged. No reported history of malignancy or chronic disease.     ADVANCED DIRECTIVES:    HEALTH MAINTENANCE: Social History   Tobacco Use  . Smoking status: Former Smoker    Packs/day: 2.00    Types: Cigarettes  . Smokeless tobacco: Never Used  Substance Use Topics  . Alcohol use: Yes    Alcohol/week: 0.6 oz    Types: 1 Glasses of wine per week    Comment: 1 glass of wine daily  . Drug use: No     Colonoscopy:  PAP:  Bone density:  Lipid panel:  No Known Allergies  Current Outpatient Medications  Medication Sig Dispense Refill  . amLODipine (NORVASC) 10 MG tablet     . aspirin 81 MG tablet Take 81 mg by mouth daily.    . cyanocobalamin (,VITAMIN B-12,) 1000 MCG/ML injection Inject into the muscle.    . diphenhydrAMINE (BENADRYL) 50 MG tablet Take 50 mg by mouth at bedtime as needed for itching.    . Ferrous Sulfate (IRON SLOW RELEASE PO) Take by mouth.    . hydrochlorothiazide (HYDRODIURIL) 25 MG tablet Take 25 mg by mouth daily.    Marland Kitchen HYDROcodone-acetaminophen (NORCO/VICODIN) 5-325 MG tablet Take 1-2 tablets by mouth every 4 (four) hours as needed for moderate pain or severe pain. 60 tablet 0  . lisinopril (PRINIVIL,ZESTRIL)  40 MG tablet Take 40 mg by mouth every morning.    . Multiple Vitamins-Minerals (MULTIVITAMIN ADULT PO) Take by mouth.    . SF 1.1 % GEL dental gel     . tiZANidine (ZANAFLEX) 2 MG tablet Take 1 tablet (2 mg total) by mouth at bedtime as needed for muscle spasms. 30 tablet 0  . traMADol-acetaminophen (ULTRACET) 37.5-325 MG per tablet Take 1 tablet by mouth every 8 (eight) hours as needed.    . hydrALAZINE (APRESOLINE) 25 MG tablet Take by mouth.     No  current facility-administered medications for this visit.     OBJECTIVE: Vitals:   04/14/17 0917  BP: (!) 187/63  Pulse: 86  Resp: 18  Temp: 98.5 F (36.9 C)     Body mass index is 20.3 kg/m.    ECOG FS:0 - Asymptomatic  General: Well-developed, well-nourished, no acute distress. Eyes: Pink conjunctiva, anicteric sclera. Lungs: Clear to auscultation bilaterally. Heart: Regular rate and rhythm. No rubs, murmurs, or gallops. Abdomen: Soft, nontender, nondistended. No organomegaly noted, normoactive bowel sounds. Musculoskeletal: No edema, cyanosis, or clubbing. Neuro: Alert, answering all questions appropriately. Cranial nerves grossly intact. Skin: No rashes or petechiae noted. Psych: Normal affect.   LAB RESULTS:  Lab Results  Component Value Date   NA 130 (L) 03/16/2017   K 3.3 (L) 03/16/2017   CL 97 (L) 03/16/2017   CO2 22 03/16/2017   GLUCOSE 147 (H) 03/16/2017   BUN 19 03/16/2017   CREATININE 0.90 03/16/2017   CALCIUM 9.5 03/16/2017   PROT 7.3 03/27/2013   ALBUMIN 4.1 03/27/2013   AST 12 (L) 03/27/2013   ALT 20 03/27/2013   ALKPHOS 87 03/27/2013   BILITOT 0.5 03/27/2013   GFRNONAA 57 (L) 03/16/2017   GFRAA >60 03/16/2017    Lab Results  Component Value Date   WBC 3.7 04/12/2017   NEUTROABS 2.3 04/12/2017   HGB 7.4 (L) 04/12/2017   HCT 20.9 (L) 04/12/2017   MCV 111.1 (H) 04/12/2017   PLT 411 04/12/2017   Lab Results  Component Value Date   IRON 186 (H) 02/15/2017   TIBC 370 02/15/2017   IRONPCTSAT 50 (H) 02/15/2017   Lab Results  Component Value Date   FERRITIN 257 02/15/2017     STUDIES: No results found.  ASSESSMENT: MDS 5q-, CLL   PLAN:    1. 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  Repeat bone marrow biopsy was discussed today, but patient has refused.  She also continues to refuse treatment with Revlimid.  Port placement was discussed, but patient does not wish one at this time.   Proceed with 1 unit of packed red blood cells today.  Return to clinic in 4 weeks with repeat laboratory work and consideration of additional blood and then in 8 weeks for further evaluation. 2.  Anemia: Likely secondary to progressive MDS.  Proceed with 1 unit packed red blood cells as above. 3.  Elevated iron stores: Patient will likely require IV Desferal in the future. 4.  CLL: CT scan results from June 09, 2014 were independently reviewed and did not reveal any evidence of recurrent disease. All of her labwork is also either negative or within normal limits. Patient last received single agent Rituxan in February 2015. No treatment is needed at this time.    Patient expressed understanding and was in agreement with this plan. She also understands that She can call clinic at any time with any questions, concerns,  or complaints.   Lloyd Huger, MD   04/14/2017 10:15 AM

## 2017-04-12 ENCOUNTER — Inpatient Hospital Stay: Payer: Medicare Other

## 2017-04-12 DIAGNOSIS — D649 Anemia, unspecified: Secondary | ICD-10-CM

## 2017-04-12 DIAGNOSIS — C9111 Chronic lymphocytic leukemia of B-cell type in remission: Secondary | ICD-10-CM | POA: Diagnosis not present

## 2017-04-12 LAB — CBC WITH DIFFERENTIAL/PLATELET
Basophils Absolute: 0.1 10*3/uL (ref 0–0.1)
Basophils Relative: 4 %
Eosinophils Absolute: 0.1 10*3/uL (ref 0–0.7)
Eosinophils Relative: 3 %
HCT: 20.9 % — ABNORMAL LOW (ref 35.0–47.0)
Hemoglobin: 7.4 g/dL — ABNORMAL LOW (ref 12.0–16.0)
Lymphocytes Relative: 21 %
Lymphs Abs: 0.8 10*3/uL — ABNORMAL LOW (ref 1.0–3.6)
MCH: 39 pg — ABNORMAL HIGH (ref 26.0–34.0)
MCHC: 35.1 g/dL (ref 32.0–36.0)
MCV: 111.1 fL — ABNORMAL HIGH (ref 80.0–100.0)
Monocytes Absolute: 0.4 10*3/uL (ref 0.2–0.9)
Monocytes Relative: 12 %
Neutro Abs: 2.3 10*3/uL (ref 1.4–6.5)
Neutrophils Relative %: 60 %
Platelets: 411 10*3/uL (ref 150–440)
RBC: 1.88 MIL/uL — ABNORMAL LOW (ref 3.80–5.20)
RDW: 21 % — ABNORMAL HIGH (ref 11.5–14.5)
WBC: 3.7 10*3/uL (ref 3.6–11.0)

## 2017-04-12 LAB — SAMPLE TO BLOOD BANK

## 2017-04-13 ENCOUNTER — Other Ambulatory Visit: Payer: Self-pay | Admitting: Oncology

## 2017-04-13 ENCOUNTER — Other Ambulatory Visit: Payer: Self-pay | Admitting: *Deleted

## 2017-04-13 DIAGNOSIS — D649 Anemia, unspecified: Secondary | ICD-10-CM

## 2017-04-13 LAB — PREPARE RBC (CROSSMATCH)

## 2017-04-14 ENCOUNTER — Inpatient Hospital Stay: Payer: Medicare Other

## 2017-04-14 ENCOUNTER — Inpatient Hospital Stay (HOSPITAL_BASED_OUTPATIENT_CLINIC_OR_DEPARTMENT_OTHER): Payer: Medicare Other | Admitting: Oncology

## 2017-04-14 VITALS — BP 187/63 | HR 86 | Temp 98.5°F | Resp 18 | Wt 103.9 lb

## 2017-04-14 DIAGNOSIS — D46C Myelodysplastic syndrome with isolated del(5q) chromosomal abnormality: Secondary | ICD-10-CM | POA: Diagnosis not present

## 2017-04-14 DIAGNOSIS — C9111 Chronic lymphocytic leukemia of B-cell type in remission: Secondary | ICD-10-CM

## 2017-04-14 DIAGNOSIS — R5382 Chronic fatigue, unspecified: Secondary | ICD-10-CM | POA: Diagnosis not present

## 2017-04-14 DIAGNOSIS — Z87891 Personal history of nicotine dependence: Secondary | ICD-10-CM

## 2017-04-14 DIAGNOSIS — C9110 Chronic lymphocytic leukemia of B-cell type not having achieved remission: Secondary | ICD-10-CM

## 2017-04-14 DIAGNOSIS — I10 Essential (primary) hypertension: Secondary | ICD-10-CM | POA: Diagnosis not present

## 2017-04-14 DIAGNOSIS — D649 Anemia, unspecified: Secondary | ICD-10-CM | POA: Diagnosis not present

## 2017-04-14 DIAGNOSIS — Z9225 Personal history of immunosupression therapy: Secondary | ICD-10-CM

## 2017-04-14 DIAGNOSIS — Z8543 Personal history of malignant neoplasm of ovary: Secondary | ICD-10-CM

## 2017-04-14 DIAGNOSIS — Z7982 Long term (current) use of aspirin: Secondary | ICD-10-CM | POA: Diagnosis not present

## 2017-04-14 DIAGNOSIS — Z79899 Other long term (current) drug therapy: Secondary | ICD-10-CM

## 2017-04-14 DIAGNOSIS — R531 Weakness: Secondary | ICD-10-CM | POA: Diagnosis not present

## 2017-04-14 DIAGNOSIS — M1990 Unspecified osteoarthritis, unspecified site: Secondary | ICD-10-CM

## 2017-04-14 MED ORDER — ACETAMINOPHEN 325 MG PO TABS
650.0000 mg | ORAL_TABLET | Freq: Once | ORAL | Status: AC
  Administered 2017-04-14: 650 mg via ORAL
  Filled 2017-04-14: qty 2

## 2017-04-14 MED ORDER — DIPHENHYDRAMINE HCL 50 MG/ML IJ SOLN
25.0000 mg | Freq: Once | INTRAMUSCULAR | Status: AC
  Administered 2017-04-14: 25 mg via INTRAVENOUS
  Filled 2017-04-14: qty 1

## 2017-04-14 MED ORDER — SODIUM CHLORIDE 0.9 % IV SOLN
250.0000 mL | Freq: Once | INTRAVENOUS | Status: AC
  Administered 2017-04-14: 250 mL via INTRAVENOUS
  Filled 2017-04-14: qty 250

## 2017-04-14 NOTE — Patient Instructions (Signed)

## 2017-04-14 NOTE — Progress Notes (Signed)
UNMATCHED BLOOD PRODUCT NOTE  Compare the patient ID on the blood tag to the patient ID on the hospital armband and Blood Bank armband. Then confirm the unit number on the blood tag matches the unit number on the blood product.  If a discrepancy is discovered return the product to blood bank immediately.   Blood Product Type: Red Blood Cells  Unit #: \=[id number]\  Product Code #: \=<E0336V00\   Start Time:1025  Starting Rate:120 ml/hr  Rate increase/decreased  (if applicable):  010    ml/hr  Rate changed time (if applicable):    Stop Time: 1215   All Other Documentation should be documented within the Blood Admin Flowsheet per policy.

## 2017-04-14 NOTE — Progress Notes (Signed)
Safety check performed on blood product by myself and Sofie Rower, RN.  Blood bank and lab staff aware that documentation algorithm not working properly for this unit of blood.

## 2017-04-15 LAB — TYPE AND SCREEN
ABO/RH(D): AB POS
Antibody Screen: NEGATIVE
Unit division: 0

## 2017-04-15 LAB — BPAM RBC
Blood Product Expiration Date: 201902072359
ISSUE DATE / TIME: 201901250957
Unit Type and Rh: 6200

## 2017-04-25 ENCOUNTER — Other Ambulatory Visit: Payer: Self-pay | Admitting: *Deleted

## 2017-04-25 MED ORDER — HYDROCODONE-ACETAMINOPHEN 5-325 MG PO TABS: 1.0000 | ORAL_TABLET | ORAL | 0 refills | Status: DC | PRN

## 2017-05-11 ENCOUNTER — Other Ambulatory Visit: Payer: Self-pay | Admitting: *Deleted

## 2017-05-11 ENCOUNTER — Inpatient Hospital Stay: Payer: Medicare Other | Attending: Oncology

## 2017-05-11 ENCOUNTER — Other Ambulatory Visit: Payer: Self-pay | Admitting: Oncology

## 2017-05-11 ENCOUNTER — Telehealth: Payer: Self-pay | Admitting: *Deleted

## 2017-05-11 DIAGNOSIS — D649 Anemia, unspecified: Secondary | ICD-10-CM | POA: Insufficient documentation

## 2017-05-11 DIAGNOSIS — C9111 Chronic lymphocytic leukemia of B-cell type in remission: Secondary | ICD-10-CM | POA: Insufficient documentation

## 2017-05-11 DIAGNOSIS — D46C Myelodysplastic syndrome with isolated del(5q) chromosomal abnormality: Secondary | ICD-10-CM | POA: Diagnosis not present

## 2017-05-11 DIAGNOSIS — Z9225 Personal history of immunosupression therapy: Secondary | ICD-10-CM | POA: Diagnosis not present

## 2017-05-11 DIAGNOSIS — D469 Myelodysplastic syndrome, unspecified: Secondary | ICD-10-CM

## 2017-05-11 LAB — CBC WITH DIFFERENTIAL/PLATELET
Basophils Absolute: 0.1 10*3/uL (ref 0–0.1)
Basophils Relative: 2 %
Eosinophils Absolute: 0.1 10*3/uL (ref 0–0.7)
Eosinophils Relative: 2 %
HCT: 22.5 % — ABNORMAL LOW (ref 35.0–47.0)
Hemoglobin: 7.9 g/dL — ABNORMAL LOW (ref 12.0–16.0)
Lymphocytes Relative: 20 %
Lymphs Abs: 0.8 10*3/uL — ABNORMAL LOW (ref 1.0–3.6)
MCH: 37.4 pg — ABNORMAL HIGH (ref 26.0–34.0)
MCHC: 34.9 g/dL (ref 32.0–36.0)
MCV: 107.1 fL — ABNORMAL HIGH (ref 80.0–100.0)
Monocytes Absolute: 0.5 10*3/uL (ref 0.2–0.9)
Monocytes Relative: 12 %
Neutro Abs: 2.6 10*3/uL (ref 1.4–6.5)
Neutrophils Relative %: 64 %
Platelets: 417 10*3/uL (ref 150–440)
RBC: 2.1 MIL/uL — ABNORMAL LOW (ref 3.80–5.20)
RDW: 24.5 % — ABNORMAL HIGH (ref 11.5–14.5)
WBC: 4 10*3/uL (ref 3.6–11.0)

## 2017-05-11 LAB — SAMPLE TO BLOOD BANK

## 2017-05-11 LAB — PREPARE RBC (CROSSMATCH)

## 2017-05-11 NOTE — Telephone Encounter (Signed)
Ok, let me know if I need to put in orders.

## 2017-05-11 NOTE — Telephone Encounter (Signed)
Hbg 7.9 today, left vm for pt to return call regarding possible blood transfusion in [place].

## 2017-05-12 ENCOUNTER — Inpatient Hospital Stay: Payer: Medicare Other

## 2017-05-12 DIAGNOSIS — D469 Myelodysplastic syndrome, unspecified: Secondary | ICD-10-CM

## 2017-05-12 DIAGNOSIS — C9111 Chronic lymphocytic leukemia of B-cell type in remission: Secondary | ICD-10-CM | POA: Diagnosis not present

## 2017-05-12 MED ORDER — DIPHENHYDRAMINE HCL 50 MG/ML IJ SOLN
25.0000 mg | Freq: Once | INTRAMUSCULAR | Status: AC
  Administered 2017-05-12: 25 mg via INTRAVENOUS
  Filled 2017-05-12: qty 1

## 2017-05-12 MED ORDER — SODIUM CHLORIDE 0.9 % IV SOLN
250.0000 mL | Freq: Once | INTRAVENOUS | Status: AC
  Administered 2017-05-12: 250 mL via INTRAVENOUS
  Filled 2017-05-12: qty 250

## 2017-05-12 MED ORDER — ACETAMINOPHEN 325 MG PO TABS
650.0000 mg | ORAL_TABLET | Freq: Once | ORAL | Status: AC
  Administered 2017-05-12: 650 mg via ORAL
  Filled 2017-05-12: qty 2

## 2017-05-12 NOTE — Patient Instructions (Signed)

## 2017-05-13 LAB — TYPE AND SCREEN
ABO/RH(D): AB POS
Antibody Screen: NEGATIVE
Unit division: 0

## 2017-05-13 LAB — BPAM RBC
Blood Product Expiration Date: 201903072359
ISSUE DATE / TIME: 201902220924
Unit Type and Rh: 6200

## 2017-05-25 ENCOUNTER — Other Ambulatory Visit: Payer: Self-pay | Admitting: *Deleted

## 2017-05-25 MED ORDER — HYDROCODONE-ACETAMINOPHEN 5-325 MG PO TABS: 1.0000 | ORAL_TABLET | ORAL | 0 refills | Status: DC | PRN

## 2017-05-29 ENCOUNTER — Other Ambulatory Visit: Payer: Self-pay | Admitting: *Deleted

## 2017-05-29 MED ORDER — HYDROCODONE-ACETAMINOPHEN 5-325 MG PO TABS: 1.0000 | ORAL_TABLET | ORAL | 0 refills | Status: DC | PRN

## 2017-05-29 NOTE — Telephone Encounter (Signed)
Med did not get to Jacqueline Russo, it went to [facility], out of medicine and needs refill

## 2017-06-04 NOTE — Progress Notes (Signed)
[place]  Telephone:(336) [phone] [phone]) [phone]  ID: Chaney Born OB: 09-28-1929  MR#: [id number]  TJQ#:[id number]  Patient Care Team: [name], MD as PCP - General (Internal Medicine)  CHIEF COMPLAINT: MDS 5q-, CLL  INTERVAL HISTORY: Patient returns to clinic today for repeat laboratory work evaluation, and consideration of additional blood.  She continues to be active, but complains of chronic weakness and fatigue.  She otherwise feels well. She has no neurologic complaints. She denies any recent fevers or illnesses. She denies any night sweats or weight loss. She has no chest pain or shortness of breath. She denies any nausea, vomiting, constipation, or diarrhea. She denies any melena or hematochezia. She has no urinary complaints. Patient offers no further specific complaints today.   REVIEW OF SYSTEMS:   Review of Systems  Constitutional: Positive for malaise/fatigue. Negative for fever and weight loss.  Respiratory: Negative.  Negative for cough and shortness of breath.   Cardiovascular: Negative.  Negative for chest pain and leg swelling.  Gastrointestinal: Negative.  Negative for abdominal pain, blood in stool and melena.  Genitourinary: Negative.   Musculoskeletal: Positive for joint pain.  Skin: Negative.  Negative for rash.  Neurological: Positive for weakness. Negative for sensory change.  Endo/Heme/Allergies: Does not bruise/bleed easily.  Psychiatric/Behavioral: Negative.  The patient is not nervous/anxious.     As per HPI. Otherwise, a complete review of systems is negative.  PAST MEDICAL HISTORY: Past Medical History:  Diagnosis Date  . Anemia    Myelodysplastic syndrome  . Arthritis   . Hypertension   . Leukemia, chronic lymphoid (HCC)    CLL  . Ovarian cancer ([place]) 1963    PAST SURGICAL HISTORY: Past Surgical History:  Procedure Laterality Date  . ABDOMINAL HYSTERECTOMY    . APPENDECTOMY    . BREAST SURGERY     cyst  removed from right breast  . EYE SURGERY Bilateral    Cataract Extractions  . REVERSE SHOULDER ARTHROPLASTY Right 10/28/2014   Procedure: REVERSE SHOULDER ARTHROPLASTY;  Surgeon: Corky Mull, MD;  Location: [facility] ORS;  Service: Orthopedics;  Laterality: Right;    FAMILY HISTORY: Reviewed and unchanged. No reported history of malignancy or chronic disease.     ADVANCED DIRECTIVES:    HEALTH MAINTENANCE: Social History   Tobacco Use  . Smoking status: Former Smoker    Packs/day: 2.00    Types: Cigarettes  . Smokeless tobacco: Never Used  Substance Use Topics  . Alcohol use: Yes    Alcohol/week: 0.6 oz    Types: 1 Glasses of wine per week    Comment: 1 glass of wine daily  . Drug use: No     Colonoscopy:  PAP:  Bone density:  Lipid panel:  No Known Allergies  Current Outpatient Medications  Medication Sig Dispense Refill  . amLODipine (NORVASC) 10 MG tablet     . aspirin 81 MG tablet Take 81 mg by mouth daily.    . cyanocobalamin (,VITAMIN B-12,) 1000 MCG/ML injection Inject into the muscle.    . diphenhydrAMINE (BENADRYL) 50 MG tablet Take 50 mg by mouth at bedtime as needed for itching.    . Ferrous Sulfate (IRON SLOW RELEASE PO) Take by mouth.    . hydrochlorothiazide (HYDRODIURIL) 25 MG tablet Take 25 mg by mouth daily.    Marland Kitchen HYDROcodone-acetaminophen (NORCO/VICODIN) 5-325 MG tablet Take 1-2 tablets by mouth every 4 (four) hours as needed for moderate pain or severe pain. 60 tablet 0  . lisinopril (PRINIVIL,ZESTRIL)  40 MG tablet Take 40 mg by mouth every morning.    . Multiple Vitamins-Minerals (MULTIVITAMIN ADULT PO) Take by mouth.    . SF 1.1 % GEL dental gel     . tiZANidine (ZANAFLEX) 2 MG tablet Take 1 tablet (2 mg total) by mouth at bedtime as needed for muscle spasms. 30 tablet 0  . traMADol-acetaminophen (ULTRACET) 37.5-325 MG per tablet Take 1 tablet by mouth every 8 (eight) hours as needed.    . hydrALAZINE (APRESOLINE) 25 MG tablet Take by mouth.     No  current facility-administered medications for this visit.     OBJECTIVE: Vitals:   06/09/17 0941  BP: (!) 187/69  Pulse: 69  Resp: 20  Temp: 98.7 F (37.1 C)     Body mass index is 20.02 kg/m.    ECOG FS:0 - Asymptomatic  General: Well-developed, well-nourished, no acute distress. Eyes: Pink conjunctiva, anicteric sclera. Lungs: Clear to auscultation bilaterally. Heart: Regular rate and rhythm. No rubs, murmurs, or gallops. Abdomen: Soft, nontender, nondistended. No organomegaly noted, normoactive bowel sounds. Musculoskeletal: No edema, cyanosis, or clubbing. Neuro: Alert, answering all questions appropriately. Cranial nerves grossly intact. Skin: No rashes or petechiae noted. Psych: Normal affect.   LAB RESULTS:  Lab Results  Component Value Date   NA 130 (L) 03/16/2017   K 3.3 (L) 03/16/2017   CL 97 (L) 03/16/2017   CO2 22 03/16/2017   GLUCOSE 147 (H) 03/16/2017   BUN 19 03/16/2017   CREATININE 0.90 03/16/2017   CALCIUM 9.5 03/16/2017   PROT 7.3 03/27/2013   ALBUMIN 4.1 03/27/2013   AST 12 (L) 03/27/2013   ALT 20 03/27/2013   ALKPHOS 87 03/27/2013   BILITOT 0.5 03/27/2013   GFRNONAA 57 (L) 03/16/2017   GFRAA >60 03/16/2017    Lab Results  Component Value Date   WBC 3.9 06/08/2017   NEUTROABS 2.6 05/11/2017   HGB 8.2 (L) 06/08/2017   HCT 23.7 (L) 06/08/2017   MCV 103.0 (H) 06/08/2017   PLT 438 06/08/2017   Lab Results  Component Value Date   IRON 186 (H) 02/15/2017   TIBC 370 02/15/2017   IRONPCTSAT 50 (H) 02/15/2017   Lab Results  Component Value Date   FERRITIN 257 02/15/2017     STUDIES: No results found.  ASSESSMENT: MDS 5q-, CLL   PLAN:    1. 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  Patient has finally agreed to pursue a repeat bone marrow biopsy.  Now that she needs more frequent blood transfusions, she would like to have a port placed at the same time.  A referral was made to  interventional radiology to complete both procedures simultaneously.  Proceed with 1 unit of packed red blood cells today.  Return to clinic in 4 weeks with repeat laboratory work, consideration of additional blood, and further evaluation.   2.  Anemia: Likely secondary to progressive MDS.  Proceed with 1 unit packed red blood cells as above. 3.  Elevated iron stores: Proceed with IV Desferal today. 4.  CLL: CT scan results from June 09, 2014 were independently reviewed and did not reveal any evidence of recurrent disease. All of her labwork is also either negative or within normal limits. Patient last received single agent Rituxan in February 2015.  Bone marrow biopsy as above.   Patient expressed understanding and was in agreement with this plan. She also understands that She can call clinic at any time with any questions,  concerns, or complaints.   Lloyd Huger, MD   06/11/2017 8:55 AM

## 2017-06-08 ENCOUNTER — Other Ambulatory Visit: Payer: Self-pay | Admitting: Oncology

## 2017-06-08 ENCOUNTER — Inpatient Hospital Stay: Payer: Medicare Other | Attending: Oncology

## 2017-06-08 ENCOUNTER — Other Ambulatory Visit: Payer: Self-pay

## 2017-06-08 ENCOUNTER — Other Ambulatory Visit: Payer: Self-pay | Admitting: *Deleted

## 2017-06-08 DIAGNOSIS — D46C Myelodysplastic syndrome with isolated del(5q) chromosomal abnormality: Secondary | ICD-10-CM | POA: Diagnosis not present

## 2017-06-08 DIAGNOSIS — Z87891 Personal history of nicotine dependence: Secondary | ICD-10-CM | POA: Diagnosis not present

## 2017-06-08 DIAGNOSIS — D469 Myelodysplastic syndrome, unspecified: Secondary | ICD-10-CM

## 2017-06-08 DIAGNOSIS — I10 Essential (primary) hypertension: Secondary | ICD-10-CM | POA: Diagnosis not present

## 2017-06-08 DIAGNOSIS — Z7982 Long term (current) use of aspirin: Secondary | ICD-10-CM | POA: Insufficient documentation

## 2017-06-08 DIAGNOSIS — D649 Anemia, unspecified: Secondary | ICD-10-CM | POA: Diagnosis not present

## 2017-06-08 DIAGNOSIS — R5381 Other malaise: Secondary | ICD-10-CM | POA: Diagnosis not present

## 2017-06-08 DIAGNOSIS — Z9225 Personal history of immunosupression therapy: Secondary | ICD-10-CM | POA: Insufficient documentation

## 2017-06-08 DIAGNOSIS — Z79899 Other long term (current) drug therapy: Secondary | ICD-10-CM | POA: Diagnosis not present

## 2017-06-08 DIAGNOSIS — R5382 Chronic fatigue, unspecified: Secondary | ICD-10-CM | POA: Diagnosis not present

## 2017-06-08 DIAGNOSIS — C9110 Chronic lymphocytic leukemia of B-cell type not having achieved remission: Secondary | ICD-10-CM

## 2017-06-08 DIAGNOSIS — Z8543 Personal history of malignant neoplasm of ovary: Secondary | ICD-10-CM | POA: Insufficient documentation

## 2017-06-08 DIAGNOSIS — M1990 Unspecified osteoarthritis, unspecified site: Secondary | ICD-10-CM | POA: Diagnosis not present

## 2017-06-08 DIAGNOSIS — R531 Weakness: Secondary | ICD-10-CM | POA: Insufficient documentation

## 2017-06-08 DIAGNOSIS — C9111 Chronic lymphocytic leukemia of B-cell type in remission: Secondary | ICD-10-CM | POA: Insufficient documentation

## 2017-06-08 LAB — PREPARE RBC (CROSSMATCH)

## 2017-06-08 LAB — CBC
HCT: 23.7 % — ABNORMAL LOW (ref 35.0–47.0)
Hemoglobin: 8.2 g/dL — ABNORMAL LOW (ref 12.0–16.0)
MCH: 35.7 pg — ABNORMAL HIGH (ref 26.0–34.0)
MCHC: 34.7 g/dL (ref 32.0–36.0)
MCV: 103 fL — ABNORMAL HIGH (ref 80.0–100.0)
Platelets: 438 10*3/uL (ref 150–440)
RBC: 2.3 MIL/uL — ABNORMAL LOW (ref 3.80–5.20)
RDW: 27.3 % — ABNORMAL HIGH (ref 11.5–14.5)
WBC: 3.9 10*3/uL (ref 3.6–11.0)

## 2017-06-08 LAB — SAMPLE TO BLOOD BANK

## 2017-06-09 ENCOUNTER — Encounter: Payer: Self-pay | Admitting: Oncology

## 2017-06-09 ENCOUNTER — Inpatient Hospital Stay (HOSPITAL_BASED_OUTPATIENT_CLINIC_OR_DEPARTMENT_OTHER): Payer: Medicare Other | Admitting: Oncology

## 2017-06-09 ENCOUNTER — Inpatient Hospital Stay: Payer: Medicare Other

## 2017-06-09 VITALS — BP 187/69 | HR 69 | Temp 98.7°F | Resp 20 | Wt 102.5 lb

## 2017-06-09 DIAGNOSIS — Z7982 Long term (current) use of aspirin: Secondary | ICD-10-CM

## 2017-06-09 DIAGNOSIS — D649 Anemia, unspecified: Secondary | ICD-10-CM

## 2017-06-09 DIAGNOSIS — D46C Myelodysplastic syndrome with isolated del(5q) chromosomal abnormality: Secondary | ICD-10-CM

## 2017-06-09 DIAGNOSIS — Z79899 Other long term (current) drug therapy: Secondary | ICD-10-CM

## 2017-06-09 DIAGNOSIS — C9110 Chronic lymphocytic leukemia of B-cell type not having achieved remission: Secondary | ICD-10-CM

## 2017-06-09 DIAGNOSIS — R5381 Other malaise: Secondary | ICD-10-CM | POA: Diagnosis not present

## 2017-06-09 DIAGNOSIS — Z87891 Personal history of nicotine dependence: Secondary | ICD-10-CM | POA: Diagnosis not present

## 2017-06-09 DIAGNOSIS — D469 Myelodysplastic syndrome, unspecified: Secondary | ICD-10-CM

## 2017-06-09 DIAGNOSIS — C9111 Chronic lymphocytic leukemia of B-cell type in remission: Secondary | ICD-10-CM

## 2017-06-09 DIAGNOSIS — Z8543 Personal history of malignant neoplasm of ovary: Secondary | ICD-10-CM

## 2017-06-09 DIAGNOSIS — R5382 Chronic fatigue, unspecified: Secondary | ICD-10-CM | POA: Diagnosis not present

## 2017-06-09 DIAGNOSIS — R531 Weakness: Secondary | ICD-10-CM | POA: Diagnosis not present

## 2017-06-09 DIAGNOSIS — I10 Essential (primary) hypertension: Secondary | ICD-10-CM | POA: Diagnosis not present

## 2017-06-09 DIAGNOSIS — M1990 Unspecified osteoarthritis, unspecified site: Secondary | ICD-10-CM | POA: Diagnosis not present

## 2017-06-09 MED ORDER — DIPHENHYDRAMINE HCL 50 MG/ML IJ SOLN
25.0000 mg | Freq: Once | INTRAMUSCULAR | Status: AC
  Administered 2017-06-09: 25 mg via INTRAVENOUS
  Filled 2017-06-09: qty 1

## 2017-06-09 MED ORDER — ACETAMINOPHEN 325 MG PO TABS
650.0000 mg | ORAL_TABLET | Freq: Once | ORAL | Status: AC
  Administered 2017-06-09: 650 mg via ORAL
  Filled 2017-06-09: qty 2

## 2017-06-09 MED ORDER — SODIUM CHLORIDE 0.9 % IV SOLN
15.0000 mg/kg/h | Freq: Once | INTRAVENOUS | Status: AC
  Administered 2017-06-09: 15 mg/kg/h via INTRAVENOUS
  Filled 2017-06-09 (×4): qty 2

## 2017-06-09 MED ORDER — SODIUM CHLORIDE 0.9 % IV SOLN
250.0000 mL | Freq: Once | INTRAVENOUS | Status: AC
  Administered 2017-06-09: 250 mL via INTRAVENOUS
  Filled 2017-06-09: qty 250

## 2017-06-09 NOTE — Progress Notes (Signed)
Patient denies any concerns today.  

## 2017-06-09 NOTE — Patient Instructions (Addendum)
Deferoxamine injection What is this medicine? DEFEROXAMINE (dee fer OX a meen) helps to remove excess iron from the body. This may be necessary in patients who have received multiple blood transfusions and people who have ingested too much iron. This medicine may be used for other purposes; ask your health care provider or pharmacist if you have questions. COMMON BRAND NAME(S): Desferal What should I tell my health care provider before I take this medicine? They need to know if you have any of these conditions: -difficulty passing urine or very little urine -kidney disease -an unusual or allergic reaction to deferoxamine, other medicines, foods, dyes, or preservatives -pregnant or trying to get pregnant -breast-feeding How should I use this medicine? This medicine is for injection into a muscle, slow infusion into a vein, or infusion under the skin. It is usually given by a health care professional in a hospital or clinic setting. Talk to your pediatrician regarding the use of this medicine in children. While this medicine may be prescribed for children as young as 3 years for selected conditions, precautions do apply. Patients over 62 years old may have a stronger reaction and need a smaller dose. Overdosage: If you think you have taken too much of this medicine contact a poison control center or emergency room at once. NOTE: This medicine is only for you. Do not share this medicine with others. What if I miss a dose? This does not apply. What may interact with this medicine? Do not take this medicine with any of the following medications: -preparations containing iron This medicine may also interact with the following medications: -ascorbic acid (vitamin C) -[id number] - used in certain diagnostic tests -prochlorperazine This list may not describe all possible interactions. Give your health care provider a list of all the medicines, herbs, non-prescription drugs, or dietary supplements  you use. Also tell them if you smoke, drink alcohol, or use illegal drugs. Some items may interact with your medicine. What should I watch for while using this medicine? Your condition will be monitored carefully while you are receiving this medicine. Visit your doctor or health care professional for regular checks on your progress. Tell your doctor or health care professional as soon as you can if you notice any change in your sight or hearing. You may get drowsy or dizzy or have problems with your vision. Do not drive, use machinery, or do anything that needs mental alertness until you know how this medicine affects you. Do not stand or sit up quickly, especially if you are an older patient. This reduces the risk of dizzy or fainting spells. While you are receiving this medicine, do not take any vitamin C products unless your doctor or health care professional tells you to. What side effects may I notice from receiving this medicine? Side effects that you should report to your doctor or health care professional as soon as possible: -allergic reactions like skin rash, itching or hives, swelling of the face, lips, or tongue -breathing problems -change in vision -diarrhea -fast heartbeat -feel dizzy, faint -fever -loss of hearing -muscle cramps -pain, swelling where injected -skin flushing, redness -stomach pain Side effects that usually do not require medical attention (report to your doctor or health care professional if they continue or are bothersome): -red coloration of urine This list may not describe all possible side effects. Call your doctor for medical advice about side effects. You may report side effects to FDA at 1-800-FDA-1088. Where should I keep my medicine? This drug is  given in a hospital or clinic and will not be stored at home. NOTE: This sheet is a summary. It may not cover all possible information. If you have questions about this medicine, talk to your doctor,  pharmacist, or health care provider.  2018 Elsevier/Gold Standard (2007-06-28 17:37:06)  Blood Transfusion, Care After This sheet gives you information about how to care for yourself after your procedure. Your doctor may also give you more specific instructions. If you have problems or questions, contact your doctor. Follow these instructions at home:  Take over-the-counter and prescription medicines only as told by your doctor.  Go back to your normal activities as told by your doctor.  Follow instructions from your doctor about how to take care of the area where an IV tube was put into your vein (insertion site). Make sure you: ? Wash your hands with soap and water before you change your bandage (dressing). If there is no soap and water, use hand sanitizer. ? Change your bandage as told by your doctor.  Check your IV insertion site every day for signs of infection. Check for: ? More redness, swelling, or pain. ? More fluid or blood. ? Warmth. ? Pus or a bad smell. Contact a doctor if:  You have more redness, swelling, or pain around the IV insertion site..  You have more fluid or blood coming from the IV insertion site.  Your IV insertion site feels warm to the touch.  You have pus or a bad smell coming from the IV insertion site.  Your pee (urine) turns pink, red, or brown.  You feel weak after doing your normal activities. Get help right away if:  You have signs of a serious allergic or body defense (immune) system reaction, including: ? Itchiness. ? Hives. ? Trouble breathing. ? Anxiety. ? Pain in your chest or lower back. ? Fever, flushing, and chills. ? Fast pulse. ? Rash. ? Watery poop (diarrhea). ? Throwing up (vomiting). ? Dark pee. ? Serious headache. ? Dizziness. ? Stiff neck. ? Yellow color in your face or the white parts of your eyes (jaundice). Summary  After a blood transfusion, return to your normal activities as told by your doctor.  Every  day, check for signs of infection where the IV tube was put into your vein.  Some signs of infection are warm skin, more redness and pain, more fluid or blood, and pus or a bad smell where the needle went in.  Contact your doctor if you feel weak or have any unusual symptoms. This information is not intended to replace advice given to you by your health care provider. Make sure you discuss any questions you have with your health care provider. Document Released: 03/28/2014 Document Revised: 10/30/2015 Document Reviewed: 10/30/2015 Elsevier Interactive Patient Education  2017 [employer].

## 2017-06-09 NOTE — Progress Notes (Signed)
Patient was informed about new medication , Desferal, that we now have to give prior to her blood transfusion.  Patient reported that she had purchased iron supplements and was not able to find the slow release ones.  Dr. Grayland Ormond made aware that patient was taking iron supplement and has since then advised the patient to stop taking the iron.  Patient verbalized understanding.

## 2017-06-10 LAB — TYPE AND SCREEN
ABO/RH(D): AB POS
Antibody Screen: NEGATIVE
Unit division: 0

## 2017-06-10 LAB — BPAM RBC
Blood Product Expiration Date: 201904022359
ISSUE DATE / TIME: 201903221409
Unit Type and Rh: 7300

## 2017-06-12 ENCOUNTER — Other Ambulatory Visit: Payer: Self-pay | Admitting: *Deleted

## 2017-06-12 DIAGNOSIS — D469 Myelodysplastic syndrome, unspecified: Secondary | ICD-10-CM

## 2017-06-13 ENCOUNTER — Telehealth: Payer: Self-pay | Admitting: *Deleted

## 2017-06-13 NOTE — Telephone Encounter (Signed)
Left vm for pt regarding appt details for upcoming biopsy and port placement.

## 2017-06-26 ENCOUNTER — Other Ambulatory Visit: Payer: Self-pay | Admitting: *Deleted

## 2017-06-26 MED ORDER — HYDROCODONE-ACETAMINOPHEN 5-325 MG PO TABS: 1.0000 | ORAL_TABLET | ORAL | 0 refills | Status: DC | PRN

## 2017-06-26 NOTE — Addendum Note (Signed)
Addended by: Betti Cruz on: 06/26/2017 09:27 AM   Modules accepted: Orders

## 2017-06-29 ENCOUNTER — Other Ambulatory Visit: Payer: Self-pay | Admitting: Radiology

## 2017-06-30 ENCOUNTER — Other Ambulatory Visit (HOSPITAL_COMMUNITY)
Admission: RE | Admit: 2017-06-30 | Discharge: 2017-06-30 | Disposition: A | Discharge status: Home / Self Care | Payer: Medicare Other | Source: Ambulatory Visit | Attending: Oncology | Admitting: Oncology

## 2017-06-30 ENCOUNTER — Ambulatory Visit
Admission: RE | Admit: 2017-06-30 | Discharge: 2017-06-30 | Disposition: A | Discharge status: Home / Self Care | Payer: Medicare Other | Source: Ambulatory Visit | Attending: Oncology | Admitting: Oncology

## 2017-06-30 ENCOUNTER — Ambulatory Visit: Payer: Medicare Other

## 2017-06-30 DIAGNOSIS — Z856 Personal history of leukemia: Secondary | ICD-10-CM | POA: Insufficient documentation

## 2017-06-30 DIAGNOSIS — Z87891 Personal history of nicotine dependence: Secondary | ICD-10-CM | POA: Diagnosis not present

## 2017-06-30 DIAGNOSIS — D46C Myelodysplastic syndrome with isolated del(5q) chromosomal abnormality: Secondary | ICD-10-CM

## 2017-06-30 DIAGNOSIS — C9110 Chronic lymphocytic leukemia of B-cell type not having achieved remission: Secondary | ICD-10-CM | POA: Insufficient documentation

## 2017-06-30 DIAGNOSIS — Z8543 Personal history of malignant neoplasm of ovary: Secondary | ICD-10-CM | POA: Insufficient documentation

## 2017-06-30 DIAGNOSIS — I10 Essential (primary) hypertension: Secondary | ICD-10-CM | POA: Diagnosis not present

## 2017-06-30 DIAGNOSIS — Z7982 Long term (current) use of aspirin: Secondary | ICD-10-CM | POA: Diagnosis not present

## 2017-06-30 DIAGNOSIS — D61818 Other pancytopenia: Secondary | ICD-10-CM | POA: Diagnosis not present

## 2017-06-30 DIAGNOSIS — I878 Other specified disorders of veins: Secondary | ICD-10-CM | POA: Insufficient documentation

## 2017-06-30 DIAGNOSIS — D469 Myelodysplastic syndrome, unspecified: Secondary | ICD-10-CM | POA: Diagnosis not present

## 2017-06-30 HISTORY — PX: IR FLUORO GUIDE CV LINE RIGHT: IMG2283

## 2017-06-30 LAB — CBC WITH DIFFERENTIAL/PLATELET
Basophils Absolute: 0.1 10*3/uL (ref 0–0.1)
Basophils Relative: 2 %
Eosinophils Absolute: 0.1 10*3/uL (ref 0–0.7)
Eosinophils Relative: 2 %
HCT: 24.6 % — ABNORMAL LOW (ref 35.0–47.0)
Hemoglobin: 8.6 g/dL — ABNORMAL LOW (ref 12.0–16.0)
Lymphocytes Relative: 22 %
Lymphs Abs: 0.9 10*3/uL — ABNORMAL LOW (ref 1.0–3.6)
MCH: 33.7 pg (ref 26.0–34.0)
MCHC: 34.8 g/dL (ref 32.0–36.0)
MCV: 96.6 fL (ref 80.0–100.0)
Monocytes Absolute: 0.5 10*3/uL (ref 0.2–0.9)
Monocytes Relative: 13 %
Neutro Abs: 2.3 10*3/uL (ref 1.4–6.5)
Neutrophils Relative %: 61 %
Platelets: 432 10*3/uL (ref 150–440)
RBC: 2.55 MIL/uL — ABNORMAL LOW (ref 3.80–5.20)
RDW: 34 % — ABNORMAL HIGH (ref 11.5–14.5)
WBC: 3.8 10*3/uL (ref 3.6–11.0)

## 2017-06-30 LAB — PROTIME-INR
INR: 1.01
Prothrombin Time: 13.2 seconds (ref 11.4–15.2)

## 2017-06-30 MED ORDER — CEFAZOLIN SODIUM-DEXTROSE 1-4 GM/50ML-% IV SOLN
INTRAVENOUS | Status: AC | PRN
  Administered 2017-06-30: 2 g via INTRAVENOUS

## 2017-06-30 MED ORDER — HEPARIN SOD (PORK) LOCK FLUSH 100 UNIT/ML IV SOLN
INTRAVENOUS | Status: AC
  Filled 2017-06-30: qty 5

## 2017-06-30 MED ORDER — MIDAZOLAM HCL 2 MG/2ML IJ SOLN
INTRAMUSCULAR | Status: AC
  Filled 2017-06-30: qty 2

## 2017-06-30 MED ORDER — FENTANYL CITRATE (PF) 100 MCG/2ML IJ SOLN
INTRAMUSCULAR | Status: AC
  Filled 2017-06-30: qty 2

## 2017-06-30 MED ORDER — SODIUM CHLORIDE 0.9 % IV SOLN
INTRAVENOUS | Status: DC
  Administered 2017-06-30: 08:00:00 via INTRAVENOUS

## 2017-06-30 MED ORDER — FENTANYL CITRATE (PF) 100 MCG/2ML IJ SOLN
INTRAMUSCULAR | Status: AC | PRN
  Administered 2017-06-30 (×2): 25 ug via INTRAVENOUS

## 2017-06-30 MED ORDER — LIDOCAINE HCL 1 % IJ SOLN
INTRAMUSCULAR | Status: AC | PRN
  Administered 2017-06-30: 2 mL via INTRADERMAL

## 2017-06-30 MED ORDER — HEPARIN SOD (PORK) LOCK FLUSH 100 UNIT/ML IV SOLN
INTRAVENOUS | Status: AC
Start: 2017-06-30 — End: 2017-06-30
  Filled 2017-06-30: qty 5

## 2017-06-30 MED ORDER — LIDOCAINE-EPINEPHRINE (PF) 1 %-1:200000 IJ SOLN
INTRAMUSCULAR | Status: AC
  Filled 2017-06-30: qty 30

## 2017-06-30 MED ORDER — MIDAZOLAM HCL 2 MG/2ML IJ SOLN
INTRAMUSCULAR | Status: AC | PRN
  Administered 2017-06-30 (×4): 0.5 mg via INTRAVENOUS

## 2017-06-30 MED ORDER — LIDOCAINE-EPINEPHRINE (PF) 1 %-1:200000 IJ SOLN
INTRAMUSCULAR | Status: AC | PRN
  Administered 2017-06-30: 6 mL

## 2017-06-30 MED ORDER — FENTANYL CITRATE (PF) 100 MCG/2ML IJ SOLN
INTRAMUSCULAR | Status: AC | PRN
  Administered 2017-06-30 (×4): 25 ug via INTRAVENOUS

## 2017-06-30 MED ORDER — MIDAZOLAM HCL 2 MG/2ML IJ SOLN
INTRAMUSCULAR | Status: AC | PRN
  Administered 2017-06-30: 1 mg via INTRAVENOUS

## 2017-06-30 MED ORDER — HEPARIN (PORCINE) IN NACL 2-0.9 UNIT/ML-% IJ SOLN
INTRAMUSCULAR | Status: AC
  Filled 2017-06-30: qty 500

## 2017-06-30 MED ORDER — LIDOCAINE-EPINEPHRINE (PF) 1 %-1:200000 IJ SOLN
20.0000 mL | Freq: Once | INTRAMUSCULAR | Status: DC
  Filled 2017-06-30: qty 20

## 2017-06-30 MED ORDER — CEFAZOLIN SODIUM-DEXTROSE 2-4 GM/100ML-% IV SOLN
INTRAVENOUS | Status: AC
  Filled 2017-06-30: qty 100

## 2017-06-30 NOTE — Discharge Instructions (Signed)
Bone Marrow Aspiration and Bone Marrow Biopsy, Adult, Care After °This sheet gives you information about how to care for yourself after your procedure. Your health care provider may also give you more specific instructions. If you have problems or questions, contact your health care provider. °What can I expect after the procedure? °After the procedure, it is common to have: °· Mild pain and tenderness. °· Swelling. °· Bruising. ° °Follow these instructions at home: °· Take over-the-counter or prescription medicines only as told by your health care provider. °· Do not take baths, swim, or use a hot tub until your health care provider approves. Ask if you can take a shower or have a sponge bath. °· Follow instructions from your health care provider about how to take care of the puncture site. Make sure you: °? Wash your hands with soap and water before you change your bandage (dressing). If soap and water are not available, use hand sanitizer. °? Change your dressing as told by your health care provider. °· Check your puncture site every day for signs of infection. Check for: °? More redness, swelling, or pain. °? More fluid or blood. °? Warmth. °? Pus or a bad smell. °· Return to your normal activities as told by your health care provider. Ask your health care provider what activities are safe for you. °· Do not drive for 24 hours if you were given a medicine to help you relax (sedative). °· Keep all follow-up visits as told by your health care provider. This is important. °Contact a health care provider if: °· You have more redness, swelling, or pain around the puncture site. °· You have more fluid or blood coming from the puncture site. °· Your puncture site feels warm to the touch. °· You have pus or a bad smell coming from the puncture site. °· You have a fever. °· Your pain is not controlled with medicine. °This information is not intended to replace advice given to you by your health care provider. Make sure  you discuss any questions you have with your health care provider. °Document Released: 09/24/2004 Document Revised: 09/25/2015 Document Reviewed: 08/19/2015 °Elsevier Interactive Patient Education © 2018 [employer] ° °Moderate Conscious Sedation, Adult, Care After °These instructions provide you with information about caring for yourself after your procedure. Your health care provider may also give you more specific instructions. Your treatment has been planned according to current medical practices, but problems sometimes occur. Call your health care provider if you have any problems or questions after your procedure. °What can I expect after the procedure? °After your procedure, it is common: °· To feel sleepy for several hours. °· To feel clumsy and have poor balance for several hours. °· To have poor judgment for several hours. °· To vomit if you eat too soon. ° °Follow these instructions at home: °For at least 24 hours after the procedure: ° °· Do not: °? Participate in activities where you could fall or become injured. °? Drive. °? Use heavy machinery. °? Drink alcohol. °? Take sleeping pills or medicines that cause drowsiness. °? Make important decisions or sign legal documents. °? Take care of children on your own. °· Rest. °Eating and drinking °· Follow the diet recommended by your health care provider. °· If you vomit: °? Drink water, juice, or soup when you can drink without vomiting. °? Make sure you have little or no nausea before eating solid foods. °General instructions °· Have a responsible adult stay with you until you   are awake and alert.  Take over-the-counter and prescription medicines only as told by your health care provider.  If you smoke, do not smoke without supervision.  Keep all follow-up visits as told by your health care provider. This is important. Contact a health care provider if:  You keep feeling nauseous or you keep vomiting.  You feel light-headed.  You develop a  rash.  You have a fever. Get help right away if:  You have trouble breathing. This information is not intended to replace advice given to you by your health care provider. Make sure you discuss any questions you have with your health care provider. Document Released: 12/26/2012 Document Revised: 08/10/2015 Document Reviewed: 06/27/2015 Elsevier Interactive Patient Education  2018 [employer] [place] Insertion, Care After This sheet gives you information about how to care for yourself after your procedure. Your health care provider may also give you more specific instructions. If you have problems or questions, contact your health care provider. What can I expect after the procedure? After your procedure, it is common to have:  Discomfort at the port insertion site.  Bruising on the skin over the port. This should improve over 3-4 days.  Follow these instructions at home: [facility] care  After your port is placed, you will get a manufacturer's information card. The card has information about your port. Keep this card with you at all times.  Take care of the port as told by your health care provider. Ask your health care provider if you or a family member can get training for taking care of the port at home. A home health care nurse may also take care of the port.  Make sure to remember what type of port you have. Incision care  Follow instructions from your health care provider about how to take care of your port insertion site. Make sure you: ? Wash your hands with soap and water before you change your bandage (dressing). If soap and water are not available, use hand sanitizer. ? Change your dressing as told by your health care provider. ? Leave stitches (sutures), skin glue, or adhesive strips in place. These skin closures may need to stay in place for 2 weeks or longer. If adhesive strip edges start to loosen and curl up, you may trim the loose edges. Do not remove adhesive  strips completely unless your health care provider tells you to do that.  Check your port insertion site every day for signs of infection. Check for: ? More redness, swelling, or pain. ? More fluid or blood. ? Warmth. ? Pus or a bad smell. General instructions  Do not take baths, swim, or use a hot tub until your health care provider approves.  Do not lift anything that is heavier than 10 lb (4.5 kg) for a week, or as told by your health care provider.  Ask your health care provider when it is okay to: ? Return to work or school. ? Resume usual physical activities or sports.  Do not drive for 24 hours if you were given a medicine to help you relax (sedative).  Take over-the-counter and prescription medicines only as told by your health care provider.  Wear a medical alert bracelet in case of an emergency. This will tell any health care providers that you have a port.  Keep all follow-up visits as told by your health care provider. This is important. Contact a health care provider if:  You cannot flush your port with saline  as directed, or you cannot draw blood from the port.  You have a fever or chills.  You have more redness, swelling, or pain around your port insertion site.  You have more fluid or blood coming from your port insertion site.  Your port insertion site feels warm to the touch.  You have pus or a bad smell coming from the port insertion site. Get help right away if:  You have chest pain or shortness of breath.  You have bleeding from your port that you cannot control. Summary  Take care of the port as told by your health care provider.  Change your dressing as told by your health care provider.  Keep all follow-up visits as told by your health care provider. This information is not intended to replace advice given to you by your health care provider. Make sure you discuss any questions you have with your health care provider. Document Released:  12/26/2012 Document Revised: 01/27/2016 Document Reviewed: 01/27/2016 Elsevier Interactive Patient Education  2017 [employer].

## 2017-06-30 NOTE — Consult Note (Signed)
Chief Complaint: CLL; Poor venous access  Referring Physician(s): Finnegan,Timothy J  Patient Status: [facility] - Out-pt  History of Present Illness: Jacqueline Russo is a 84 y.o. female with past mental history significant for myelodysplastic syndrome, hypertension, remote history of ovarian cancer, anemia and CLL who presents today for both CT-guided bone marrow biopsy and aspiration as well as image guided port catheter placement.  The patient is unaccompanied and serves as a own historian.  Patient reports she is in her baseline state of well health.  Specifically, she denies fever or chills.  No chest pain or shortness of breath.  Past Medical History:  Diagnosis Date  . Anemia    Myelodysplastic syndrome  . Arthritis   . Hypertension   . Leukemia, chronic lymphoid (HCC)    CLL  . Ovarian cancer ([place]) 1963    Past Surgical History:  Procedure Laterality Date  . ABDOMINAL HYSTERECTOMY    . APPENDECTOMY    . BREAST SURGERY     cyst removed from right breast  . EYE SURGERY Bilateral    Cataract Extractions  . REVERSE SHOULDER ARTHROPLASTY Right 10/28/2014   Procedure: REVERSE SHOULDER ARTHROPLASTY;  Surgeon: Corky Mull, MD;  Location: [facility] ORS;  Service: Orthopedics;  Laterality: Right;    Allergies: Patient has no known allergies.  Medications: Prior to Admission medications   Medication Sig Start Date End Date Taking? Authorizing Provider  amLODipine (NORVASC) 10 MG tablet  06/24/16  Yes [provider]  aspirin 81 MG tablet Take 81 mg by mouth daily.   Yes [provider]  cyanocobalamin (,VITAMIN B-12,) 1000 MCG/ML injection Inject into the muscle. 03/09/16  Yes [provider]  diphenhydrAMINE (BENADRYL) 50 MG tablet Take 50 mg by mouth at bedtime as needed for itching.   Yes [provider]  hydrALAZINE (APRESOLINE) 25 MG tablet Take by mouth. 05/26/15 06/30/17 Yes [provider]  hydrochlorothiazide (HYDRODIURIL) 25 MG  tablet Take 25 mg by mouth daily.   Yes [provider]  HYDROcodone-acetaminophen (NORCO/VICODIN) 5-325 MG tablet Take 1-2 tablets by mouth every 4 (four) hours as needed for moderate pain or severe pain. 06/26/17  Yes Lloyd Huger, MD  lisinopril (PRINIVIL,ZESTRIL) 40 MG tablet Take 40 mg by mouth every morning.   Yes [provider]  tiZANidine (ZANAFLEX) 2 MG tablet Take 1 tablet (2 mg total) by mouth at bedtime as needed for muscle spasms. 12/29/16  Yes Burns, Wandra Feinstein, NP  traMADol-acetaminophen (ULTRACET) 37.5-325 MG per tablet Take 1 tablet by mouth every 8 (eight) hours as needed.   Yes [provider]  Ferrous Sulfate (IRON SLOW RELEASE PO) Take by mouth.    [provider]  Multiple Vitamins-Minerals (MULTIVITAMIN ADULT PO) Take by mouth.    [provider]  SF 1.1 % GEL dental gel  05/31/16   [provider]     History reviewed. No pertinent family history.  Social History   Socioeconomic History  . Marital status: Widowed    Spouse name: Not on file  . Number of children: Not on file  . Years of education: Not on file  . Highest education level: Not on file  Occupational History  . Not on file  Social Needs  . Financial resource strain: Not on file  . Food insecurity:    Worry: Not on file    Inability: Not on file  . Transportation needs:    Medical: Not on file    Non-medical: Not on  file  Tobacco Use  . Smoking status: Former Smoker    Packs/day: 2.00    Types: Cigarettes  . Smokeless tobacco: Never Used  Substance and Sexual Activity  . Alcohol use: Yes    Alcohol/week: 0.6 oz    Types: 1 Glasses of wine per week    Comment: 1 glass of wine daily  . Drug use: No  . Sexual activity: Not on file  Lifestyle  . Physical activity:    Days per week: Not on file    Minutes per session: Not on file  . Stress: Not on file  Relationships  . Social connections:    Talks on phone: Not on file    Gets  together: Not on file    Attends religious service: Not on file    Active member of club or organization: Not on file    Attends meetings of clubs or organizations: Not on file    Relationship status: Not on file  Other Topics Concern  . Not on file  Social History Narrative  . Not on file    ECOG Status: 0 - Asymptomatic  Review of Systems: A 12 point ROS discussed and pertinent positives are indicated in the HPI above.  All other systems are negative.  Review of Systems  Constitutional: Negative for activity change, appetite change, fatigue and unexpected weight change.  Respiratory: Negative.   Cardiovascular: Negative.   Gastrointestinal: Negative.     Vital Signs: BP (!) 142/84   Pulse 89   Temp 98.3 F (36.8 C) (Oral)   Resp 10   Ht 5' (1.524 m)   Wt 102 lb (46.3 kg)   SpO2 99%   BMI 19.92 kg/m   Physical Exam  Constitutional: She appears well-developed and well-nourished.  HENT:  Head: Normocephalic and atraumatic.  Cardiovascular: Normal rate and regular rhythm.  Pulmonary/Chest: Effort normal and breath sounds normal.  Skin: Skin is warm and dry.  Psychiatric: She has a normal mood and affect. Her behavior is normal.  Nursing note and vitals reviewed.   Imaging: No results found.  Labs:  CBC: Recent Labs    04/12/17 0916 05/11/17 0924 06/08/17 0929 06/30/17 0751  WBC 3.7 4.0 3.9 3.8  HGB 7.4* 7.9* 8.2* 8.6*  HCT 20.9* 22.5* 23.7* 24.6*  PLT 411 417 438 432    COAGS: Recent Labs    06/30/17 0751  INR 1.01    BMP: Recent Labs    10/28/16 1507 03/16/17 0906  NA 136 130*  K 3.2* 3.3*  CL 100* 97*  CO2 21* 22  GLUCOSE 124* 147*  BUN 17 19  CALCIUM 10.6* 9.5  CREATININE 0.72 0.90  GFRNONAA >60 57*  GFRAA >60 >60    LIVER FUNCTION TESTS: No results for input(s): BILITOT, AST, ALT, ALKPHOS, PROT, ALBUMIN in the last 8760 hours.  TUMOR MARKERS: No results for input(s): AFPTM, CEA, CA199, CHROMGRNA in the last 8760  hours.  Assessment and Plan:  Jacqueline Russo is a 84 y.o. female with past mental history significant for myelodysplastic syndrome, hypertension, remote history of ovarian cancer, anemia and CLL who presents today for both CT-guided bone marrow biopsy and aspiration as well as image guided port catheter placement.  Patient reports she is in her baseline state of well health.    Risks and benefits of CT-guided bone marrow biopsy and aspiration discussed with the patient including, but not limited to bleeding, infection, damage to adjacent structures or low yield requiring additional tests.  Risks  and benefits of image guided port-a-catheter placement was discussed with the patient including, but not limited to bleeding, infection, pneumothorax, or fibrin sheath development and need for additional procedures.  All of the patient's questions were answered, patient is agreeable to proceed.  Consent signed and in chart.  Thank you for this interesting consult.  I greatly enjoyed meeting Jacqueline Russo and look forward to participating in their care.  A copy of this report was sent to the requesting provider on this date.  Electronically Signed: Sandi Mariscal, MD 06/30/2017, 9:38 AM   I spent a total of 15 Minutes in face to face in clinical consultation, greater than 50% of which was counseling/coordinating care for CT guided BM Bx and image guided port placement.

## 2017-06-30 NOTE — Procedures (Signed)
Pre-procedure Diagnosis: CLL Post-procedure Diagnosis: Same  Technically successful CT guided bone marrow aspiration and biopsy of left iliac crest.   Complications: None Immediate  EBL: None  SignedSandi Mariscal Pager: [phone] 06/30/2017, 9:43 AM

## 2017-06-30 NOTE — Procedures (Signed)
Pre Procedure Dx: Poor venous access Post Procedural Dx: Same  Successful placement of right IJ approach port-a-cath with tip at the superior caval atrial junction. The catheter is ready for immediate use.  Estimated Blood Loss: Minimal  Complications: None immediate.  Jay Daymon Hora, MD Pager #: [phone]   

## 2017-07-03 NOTE — Progress Notes (Signed)
[place]  [phone] [phone]  ID: Jacqueline Russo OB: 04-12-1929  MR#: [id number]  QJJ#:[id number]  Patient Care Team: [name], MD as PCP - General (Internal Medicine)  CHIEF COMPLAINT: MDS 5q-, CLL  INTERVAL HISTORY: Patient returns to clinic today for further evaluation, consideration of blood transfusion, and discussion of her bone marrow biopsy results.  She continues to have chronic weakness and fatigue, but otherwise feels well.  She also reports increased anxiety. She has no neurologic complaints. She denies any recent fevers or illnesses. She denies any night sweats or weight loss. She has no chest pain or shortness of breath. She denies any nausea, vomiting, constipation, or diarrhea. She denies any melena or hematochezia. She has no urinary complaints. Patient offers no further specific complaints today.   REVIEW OF SYSTEMS:   Review of Systems  Constitutional: Positive for malaise/fatigue. Negative for fever and weight loss.  Respiratory: Negative.  Negative for cough and shortness of breath.   Cardiovascular: Negative.  Negative for chest pain and leg swelling.  Gastrointestinal: Negative.  Negative for abdominal pain, blood in stool and melena.  Genitourinary: Negative.   Musculoskeletal: Positive for joint pain.  Skin: Negative.  Negative for rash.  Neurological: Positive for weakness. Negative for sensory change.  Endo/Heme/Allergies: Does not bruise/bleed easily.  Psychiatric/Behavioral: The patient is nervous/anxious.     As per HPI. Otherwise, a complete review of systems is negative.  PAST MEDICAL HISTORY: Past Medical History:  Diagnosis Date  . Anemia    Myelodysplastic syndrome  . Arthritis   . Hypertension   . Leukemia, chronic lymphoid (HCC)    CLL  . Ovarian cancer ([place]) 1963    PAST SURGICAL HISTORY: Past Surgical History:  Procedure Laterality Date  . ABDOMINAL HYSTERECTOMY    . APPENDECTOMY      . BREAST SURGERY     cyst removed from right breast  . EYE SURGERY Bilateral    Cataract Extractions  . IR FLUORO GUIDE CV LINE RIGHT  06/30/2017  . REVERSE SHOULDER ARTHROPLASTY Right 10/28/2014   Procedure: REVERSE SHOULDER ARTHROPLASTY;  Surgeon: Corky Mull, MD;  Location: [facility] ORS;  Service: Orthopedics;  Laterality: Right;    FAMILY HISTORY: Reviewed and unchanged. No reported history of malignancy or chronic disease.     ADVANCED DIRECTIVES:    HEALTH MAINTENANCE: Social History   Tobacco Use  . Smoking status: Former Smoker    Packs/day: 2.00    Types: Cigarettes  . Smokeless tobacco: Never Used  Substance Use Topics  . Alcohol use: Yes    Alcohol/week: 0.6 oz    Types: 1 Glasses of wine per week    Comment: 1 glass of wine daily  . Drug use: No     Colonoscopy:  PAP:  Bone density:  Lipid panel:  No Known Allergies  Current Outpatient Medications  Medication Sig Dispense Refill  . amLODipine (NORVASC) 10 MG tablet     . aspirin 81 MG tablet Take 81 mg by mouth daily.    . cyanocobalamin (,VITAMIN B-12,) 1000 MCG/ML injection Inject into the muscle.    . diphenhydrAMINE (BENADRYL) 50 MG tablet Take 50 mg by mouth at bedtime as needed for itching.    . Ferrous Sulfate (IRON SLOW RELEASE PO) Take by mouth.    . hydrochlorothiazide (HYDRODIURIL) 25 MG tablet Take 25 mg by mouth daily.    Marland Kitchen HYDROcodone-acetaminophen (NORCO/VICODIN) 5-325 MG tablet Take 1-2 tablets by mouth every 4 (four) hours as  needed for moderate pain or severe pain. 60 tablet 0  . lisinopril (PRINIVIL,ZESTRIL) 40 MG tablet Take 40 mg by mouth every morning.    . SF 1.1 % GEL dental gel     . tiZANidine (ZANAFLEX) 2 MG tablet Take 1 tablet (2 mg total) by mouth at bedtime as needed for muscle spasms. 30 tablet 0  . traMADol-acetaminophen (ULTRACET) 37.5-325 MG per tablet Take 1 tablet by mouth every 8 (eight) hours as needed.    . hydrALAZINE (APRESOLINE) 25 MG tablet Take by mouth.     No  current facility-administered medications for this visit.    Facility-Administered Medications Ordered in Other Visits  Medication Dose Route Frequency Provider Last Rate Last Dose  . diphenhydrAMINE (BENADRYL) injection 25 mg  25 mg Intravenous Once Lloyd Huger, MD      . heparin lock flush 100 unit/mL  250 Units Intracatheter PRN Lloyd Huger, MD      . sodium chloride flush (NS) 0.9 % injection 3 mL  3 mL Intracatheter PRN Grayland Ormond Kathlene November, MD        OBJECTIVE: There were no vitals filed for this visit.   There is no height or weight on file to calculate BMI.    ECOG FS:0 - Asymptomatic  General: Thin, no acute distress. Eyes: Pink conjunctiva, anicteric sclera. Chest wall: Port in place without erythema. Lungs: Clear to auscultation bilaterally. Heart: Regular rate and rhythm. No rubs, murmurs, or gallops. Abdomen: Soft, nontender, nondistended. No organomegaly noted, normoactive bowel sounds. Musculoskeletal: No edema, cyanosis, or clubbing. Neuro: Alert, answering all questions appropriately. Cranial nerves grossly intact. Skin: No rashes or petechiae noted. Psych: Normal affect. Lymphatics: No cervical, calvicular, axillary or inguinal LAD.   LAB RESULTS:  Lab Results  Component Value Date   NA 130 (L) 03/16/2017   K 3.3 (L) 03/16/2017   CL 97 (L) 03/16/2017   CO2 22 03/16/2017   GLUCOSE 147 (H) 03/16/2017   BUN 19 03/16/2017   CREATININE 0.90 03/16/2017   CALCIUM 9.5 03/16/2017   PROT 7.3 03/27/2013   ALBUMIN 4.1 03/27/2013   AST 12 (L) 03/27/2013   ALT 20 03/27/2013   ALKPHOS 87 03/27/2013   BILITOT 0.5 03/27/2013   GFRNONAA 57 (L) 03/16/2017   GFRAA >60 03/16/2017    Lab Results  Component Value Date   WBC 3.4 (L) 07/06/2017   NEUTROABS 1.9 07/06/2017   HGB 7.4 (L) 07/06/2017   HCT 21.3 (L) 07/06/2017   MCV 97.7 07/06/2017   PLT 387 07/06/2017   Lab Results  Component Value Date   IRON 198 (H) 07/06/2017   TIBC 278 07/06/2017    IRONPCTSAT 71 (H) 07/06/2017   Lab Results  Component Value Date   FERRITIN 434 (H) 07/06/2017     STUDIES: Ir Fluoro Guide Cv Line Right  Result Date: 06/30/2017 INDICATION: History of CLL and poor venous access. In need of durable intravenous access for chemotherapy administration. EXAM: IMPLANTED PORT A CATH PLACEMENT WITH ULTRASOUND AND FLUOROSCOPIC GUIDANCE COMPARISON:  None. MEDICATIONS: Ancef 2 gm IV; The antibiotic was administered within an appropriate time interval prior to skin puncture. ANESTHESIA/SEDATION: Moderate (conscious) sedation was employed during this procedure. A total of Versed 1 mg and Fentanyl 50 mcg was administered intravenously. Moderate Sedation Time: 30 minutes. The patient's level of consciousness and vital signs were monitored continuously by radiology nursing throughout the procedure under my direct supervision. CONTRAST:  None FLUOROSCOPY TIME:  54 seconds (6 mGy) COMPLICATIONS: None immediate.  PROCEDURE: The procedure, risks, benefits, and alternatives were explained to the patient. Questions regarding the procedure were encouraged and answered. The patient understands and consents to the procedure. The right neck and chest were prepped with chlorhexidine in a sterile fashion, and a sterile drape was applied covering the operative field. Maximum barrier sterile technique with sterile gowns and gloves were used for the procedure. A timeout was performed prior to the initiation of the procedure. Local anesthesia was provided with 1% lidocaine with epinephrine. After creating a small venotomy incision, a micropuncture kit was utilized to access the internal jugular vein. Real-time ultrasound guidance was utilized for vascular access including the acquisition of a permanent ultrasound image documenting patency of the accessed vessel. The microwire was utilized to measure appropriate catheter length. A subcutaneous port pocket was then created along the upper chest wall  utilizing a combination of sharp and blunt dissection. The pocket was irrigated with sterile saline. A single lumen power injectable port was chosen for placement. The 8 Fr catheter was tunneled from the port pocket site to the venotomy incision. The port was placed in the pocket. The external catheter was trimmed to appropriate length. At the venotomy, an 8 Fr peel-away sheath was placed over a guidewire under fluoroscopic guidance. The catheter was then placed through the sheath and the sheath was removed. Final catheter positioning was confirmed and documented with a fluoroscopic spot radiograph. The port was accessed with a Huber needle, aspirated and flushed with heparinized saline. The venotomy site was closed with an interrupted 4-0 Vicryl suture. The port pocket incision was closed with interrupted 2-0 Vicryl suture and the skin was opposed with a running subcuticular 4-0 Vicryl suture. Dermabond and Steri-strips were applied to both incisions. Dressings were placed. The patient tolerated the procedure well without immediate post procedural complication. FINDINGS: After catheter placement, the tip lies within the superior cavoatrial junction. The catheter aspirates and flushes normally and is ready for immediate use. IMPRESSION: Successful placement of a right internal jugular approach power injectable Port-A-Cath. The catheter is ready for immediate use. Electronically Signed   By: Sandi Mariscal M.D.   On: 06/30/2017 11:31   Ct Biopsy  Result Date: 06/30/2017 INDICATION: History of CLL. Please perform CT-guided bone marrow biopsy for tissue diagnostic purposes. EXAM: CT-GUIDED BONE MARROW BIOPSY AND ASPIRATION MEDICATIONS: None ANESTHESIA/SEDATION: Fentanyl 75 mcg IV; Versed 3 mg IV Sedation Time: 20 Minutes; The patient was continuously monitored during the procedure by the interventional radiology nurse under my direct supervision. COMPLICATIONS: None immediate. PROCEDURE: Informed consent was obtained  from the patient following an explanation of the procedure, risks, benefits and alternatives. The patient understands, agrees and consents for the procedure. All questions were addressed. A time out was performed prior to the initiation of the procedure. The patient was positioned prone and non-contrast localization CT was performed of the pelvis to demonstrate the iliac marrow spaces. The operative site was prepped and draped in the usual sterile fashion. Under sterile conditions and local anesthesia, a 22 gauge spinal needle was utilized for procedural planning. Next, an 11 gauge coaxial bone biopsy needle was advanced into the left iliac marrow space. Needle position was confirmed with CT imaging. Initially, bone marrow aspiration was performed. Next, a bone marrow biopsy was obtained with the 11 gauge outer bone marrow device. The 11 gauge coaxial bone biopsy needle was re-advanced into a slightly different location within the left iliac marrow space, positioning was confirmed and an additional bone marrow biopsy was obtained.  Samples were prepared with the cytotechnologist and deemed adequate. The needle was removed intact. Hemostasis was obtained with compression and a dressing was placed. The patient tolerated the procedure well without immediate post procedural complication. IMPRESSION: Successful CT guided left iliac bone marrow aspiration and core biopsy. Electronically Signed   By: Sandi Mariscal M.D.   On: 06/30/2017 11:32    ASSESSMENT: MDS 5q-, CLL   PLAN:    1. MDS, 5q-: Bone marrow biopsy results from June 30, 2017 reviewed independently confirming progression of patient's MDS, 5q-. She has a hypercellular marrow for age as well as an increased blast count up to 6%.  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  Revlimid was once again discussed, but patient has declined treatment.  We also discussed the possibility of progressing to AML, but patient wishes  no further interventions other than periodic blood transfusions.  Proceed with 1 unit of packed red blood cells today.  Return to clinic in 4 weeks with repeat laboratory work, consideration of additional blood, and further evaluation.   2.  Anemia: Repeat bone marrow biopsy as above.  Secondary to MDS.  Proceed with transfusion as above.  3.  Elevated iron stores: Continue IV Desferal with transfusions. 4.  CLL: Bone marrow biopsy did not mention any involvement of CLL.  White count is now slightly decreased.  CT scan results from June 09, 2014 were independently reviewed and did not reveal any evidence of recurrent disease. All of her labwork is also either negative or within normal limits. Patient last received single agent Rituxan in February 2015.  5.  Anxiety: Patient was given a prescription for Xanax as needed.   Patient expressed understanding and was in agreement with this plan. She also understands that She can call clinic at any time with any questions, concerns, or complaints.   Lloyd Huger, MD   07/07/2017 1:19 PM

## 2017-07-06 ENCOUNTER — Other Ambulatory Visit: Payer: Self-pay | Admitting: Oncology

## 2017-07-06 ENCOUNTER — Inpatient Hospital Stay: Payer: Medicare Other | Attending: Oncology

## 2017-07-06 DIAGNOSIS — R5383 Other fatigue: Secondary | ICD-10-CM | POA: Insufficient documentation

## 2017-07-06 DIAGNOSIS — D46C Myelodysplastic syndrome with isolated del(5q) chromosomal abnormality: Secondary | ICD-10-CM | POA: Insufficient documentation

## 2017-07-06 DIAGNOSIS — I10 Essential (primary) hypertension: Secondary | ICD-10-CM | POA: Insufficient documentation

## 2017-07-06 DIAGNOSIS — Z79899 Other long term (current) drug therapy: Secondary | ICD-10-CM | POA: Diagnosis not present

## 2017-07-06 DIAGNOSIS — Z87891 Personal history of nicotine dependence: Secondary | ICD-10-CM | POA: Diagnosis not present

## 2017-07-06 DIAGNOSIS — C9111 Chronic lymphocytic leukemia of B-cell type in remission: Secondary | ICD-10-CM | POA: Insufficient documentation

## 2017-07-06 DIAGNOSIS — F419 Anxiety disorder, unspecified: Secondary | ICD-10-CM | POA: Insufficient documentation

## 2017-07-06 DIAGNOSIS — Z8543 Personal history of malignant neoplasm of ovary: Secondary | ICD-10-CM | POA: Diagnosis not present

## 2017-07-06 DIAGNOSIS — Z7982 Long term (current) use of aspirin: Secondary | ICD-10-CM | POA: Diagnosis not present

## 2017-07-06 DIAGNOSIS — R531 Weakness: Secondary | ICD-10-CM | POA: Insufficient documentation

## 2017-07-06 DIAGNOSIS — R5381 Other malaise: Secondary | ICD-10-CM | POA: Diagnosis not present

## 2017-07-06 DIAGNOSIS — R5382 Chronic fatigue, unspecified: Secondary | ICD-10-CM | POA: Insufficient documentation

## 2017-07-06 DIAGNOSIS — D469 Myelodysplastic syndrome, unspecified: Secondary | ICD-10-CM

## 2017-07-06 LAB — CBC WITH DIFFERENTIAL/PLATELET
Basophils Absolute: 0.1 10*3/uL (ref 0–0.1)
Basophils Relative: 3 %
Eosinophils Absolute: 0.1 10*3/uL (ref 0–0.7)
Eosinophils Relative: 2 %
HCT: 21.3 % — ABNORMAL LOW (ref 35.0–47.0)
Hemoglobin: 7.4 g/dL — ABNORMAL LOW (ref 12.0–16.0)
Lymphocytes Relative: 23 %
Lymphs Abs: 0.8 10*3/uL — ABNORMAL LOW (ref 1.0–3.6)
MCH: 34 pg (ref 26.0–34.0)
MCHC: 34.8 g/dL (ref 32.0–36.0)
MCV: 97.7 fL (ref 80.0–100.0)
Monocytes Absolute: 0.4 10*3/uL (ref 0.2–0.9)
Monocytes Relative: 13 %
Neutro Abs: 1.9 10*3/uL (ref 1.4–6.5)
Neutrophils Relative %: 59 %
Platelets: 387 10*3/uL (ref 150–440)
RBC: 2.18 MIL/uL — ABNORMAL LOW (ref 3.80–5.20)
RDW: 33.5 % — ABNORMAL HIGH (ref 11.5–14.5)
WBC: 3.4 10*3/uL — ABNORMAL LOW (ref 3.6–11.0)

## 2017-07-06 LAB — IRON AND TIBC
Iron: 198 ug/dL — ABNORMAL HIGH (ref 28–170)
Saturation Ratios: 71 % — ABNORMAL HIGH (ref 10.4–31.8)
TIBC: 278 ug/dL (ref 250–450)
UIBC: 81 ug/dL

## 2017-07-06 LAB — PREPARE RBC (CROSSMATCH)

## 2017-07-06 LAB — FERRITIN: Ferritin: 434 ng/mL — ABNORMAL HIGH (ref 11–307)

## 2017-07-07 ENCOUNTER — Other Ambulatory Visit: Payer: Self-pay

## 2017-07-07 ENCOUNTER — Encounter: Payer: Self-pay | Admitting: Oncology

## 2017-07-07 ENCOUNTER — Inpatient Hospital Stay: Payer: Medicare Other

## 2017-07-07 ENCOUNTER — Inpatient Hospital Stay (HOSPITAL_BASED_OUTPATIENT_CLINIC_OR_DEPARTMENT_OTHER): Payer: Medicare Other | Admitting: Oncology

## 2017-07-07 DIAGNOSIS — D469 Myelodysplastic syndrome, unspecified: Secondary | ICD-10-CM

## 2017-07-07 DIAGNOSIS — D46C Myelodysplastic syndrome with isolated del(5q) chromosomal abnormality: Secondary | ICD-10-CM

## 2017-07-07 DIAGNOSIS — R5381 Other malaise: Secondary | ICD-10-CM

## 2017-07-07 DIAGNOSIS — R5382 Chronic fatigue, unspecified: Secondary | ICD-10-CM | POA: Diagnosis not present

## 2017-07-07 DIAGNOSIS — Z8543 Personal history of malignant neoplasm of ovary: Secondary | ICD-10-CM

## 2017-07-07 DIAGNOSIS — Z7982 Long term (current) use of aspirin: Secondary | ICD-10-CM

## 2017-07-07 DIAGNOSIS — R5383 Other fatigue: Secondary | ICD-10-CM | POA: Diagnosis not present

## 2017-07-07 DIAGNOSIS — C9110 Chronic lymphocytic leukemia of B-cell type not having achieved remission: Secondary | ICD-10-CM

## 2017-07-07 DIAGNOSIS — F419 Anxiety disorder, unspecified: Secondary | ICD-10-CM | POA: Diagnosis not present

## 2017-07-07 DIAGNOSIS — C9111 Chronic lymphocytic leukemia of B-cell type in remission: Secondary | ICD-10-CM | POA: Diagnosis not present

## 2017-07-07 DIAGNOSIS — R531 Weakness: Secondary | ICD-10-CM | POA: Diagnosis not present

## 2017-07-07 DIAGNOSIS — Z87891 Personal history of nicotine dependence: Secondary | ICD-10-CM

## 2017-07-07 DIAGNOSIS — I10 Essential (primary) hypertension: Secondary | ICD-10-CM

## 2017-07-07 DIAGNOSIS — Z79899 Other long term (current) drug therapy: Secondary | ICD-10-CM

## 2017-07-07 MED ORDER — SODIUM CHLORIDE 0.9 % IV SOLN
15.0000 mg/kg/h | Freq: Once | INTRAVENOUS | Status: AC
  Administered 2017-07-07: 15 mg/kg/h via INTRAVENOUS
  Filled 2017-07-07: qty 2

## 2017-07-07 MED ORDER — DIPHENHYDRAMINE HCL 25 MG PO CAPS
25.0000 mg | ORAL_CAPSULE | Freq: Once | ORAL | Status: AC
  Administered 2017-07-07: 25 mg via ORAL

## 2017-07-07 MED ORDER — SODIUM CHLORIDE 0.9 % IV SOLN
250.0000 mL | Freq: Once | INTRAVENOUS | Status: AC
  Administered 2017-07-07: 250 mL via INTRAVENOUS
  Filled 2017-07-07: qty 250

## 2017-07-07 MED ORDER — DIPHENHYDRAMINE HCL 50 MG/ML IJ SOLN: 25.0000 mg | Freq: Once | INTRAMUSCULAR | Status: DC

## 2017-07-07 MED ORDER — SODIUM CHLORIDE 0.9% FLUSH
10.0000 mL | INTRAVENOUS | Status: AC | PRN
  Administered 2017-07-07: 10 mL
  Filled 2017-07-07: qty 10

## 2017-07-07 MED ORDER — HEPARIN SOD (PORK) LOCK FLUSH 100 UNIT/ML IV SOLN
250.0000 [IU] | INTRAVENOUS | Status: DC | PRN
  Filled 2017-07-07: qty 5

## 2017-07-07 MED ORDER — ACETAMINOPHEN 325 MG PO TABS
650.0000 mg | ORAL_TABLET | Freq: Once | ORAL | Status: AC
  Administered 2017-07-07: 650 mg via ORAL
  Filled 2017-07-07: qty 2

## 2017-07-07 MED ORDER — SODIUM CHLORIDE 0.9% FLUSH
3.0000 mL | INTRAVENOUS | Status: DC | PRN
  Filled 2017-07-07: qty 3

## 2017-07-07 MED ORDER — HEPARIN SOD (PORK) LOCK FLUSH 100 UNIT/ML IV SOLN
500.0000 [IU] | Freq: Every day | INTRAVENOUS | Status: AC | PRN
  Administered 2017-07-07: 500 [IU]

## 2017-07-07 MED ORDER — ALPRAZOLAM 0.25 MG PO TABS: 0.2500 mg | ORAL_TABLET | Freq: Every day | ORAL | 0 refills | Status: DC

## 2017-07-07 NOTE — Patient Instructions (Signed)

## 2017-07-08 LAB — TYPE AND SCREEN
ABO/RH(D): AB POS
Antibody Screen: NEGATIVE
Unit division: 0

## 2017-07-08 LAB — BPAM RBC
Blood Product Expiration Date: 201905122359
ISSUE DATE / TIME: 201904191256
Unit Type and Rh: 7300

## 2017-07-10 ENCOUNTER — Other Ambulatory Visit: Payer: Self-pay | Admitting: *Deleted

## 2017-07-10 MED ORDER — ALPRAZOLAM 0.25 MG PO TABS: 0.2500 mg | ORAL_TABLET | Freq: Every day | ORAL | 0 refills | Status: DC

## 2017-07-10 NOTE — Telephone Encounter (Signed)
Ok, please resend to appropriate pharmacy.

## 2017-07-10 NOTE — Telephone Encounter (Signed)
Alprazolam was sent o Mail order and NOT [facility] as requested

## 2017-07-13 ENCOUNTER — Encounter (HOSPITAL_COMMUNITY): Payer: Self-pay | Admitting: Oncology

## 2017-07-13 LAB — CHROMOSOME ANALYSIS, BONE MARROW

## 2017-08-01 NOTE — Progress Notes (Deleted)
Jacqueline Russo  Telephone:(336) [phone] [phone]) [phone]  ID: Chaney Born OB: [birth year]  MR#: [id number]  CWC#:[id number]  Patient Care Team: [name], MD as PCP - General (Internal Medicine)  CHIEF COMPLAINT: MDS 5q-, CLL  INTERVAL HISTORY: Patient returns to clinic today for further evaluation, consideration of blood transfusion, and discussion of her bone marrow biopsy results.  She continues to have chronic weakness and fatigue, but otherwise feels well.  She also reports increased anxiety. She has no neurologic complaints. She denies any recent fevers or illnesses. She denies any night sweats or weight loss. She has no chest pain or shortness of breath. She denies any nausea, vomiting, constipation, or diarrhea. She denies any melena or hematochezia. She has no urinary complaints. Patient offers no further specific complaints today.   REVIEW OF SYSTEMS:   Review of Systems  Constitutional: Positive for malaise/fatigue. Negative for fever and weight loss.  Respiratory: Negative.  Negative for cough and shortness of breath.   Cardiovascular: Negative.  Negative for chest pain and leg swelling.  Gastrointestinal: Negative.  Negative for abdominal pain, blood in stool and melena.  Genitourinary: Negative.   Musculoskeletal: Positive for joint pain.  Skin: Negative.  Negative for rash.  Neurological: Positive for weakness. Negative for sensory change.  Endo/Heme/Allergies: Does not bruise/bleed easily.  Psychiatric/Behavioral: The patient is nervous/anxious.     As per HPI. Otherwise, a complete review of systems is negative.  PAST MEDICAL HISTORY: Past Medical History:  Diagnosis Date  . Anemia    Myelodysplastic syndrome  . Arthritis   . Hypertension   . Leukemia, chronic lymphoid (HCC)    CLL  . Ovarian cancer ([place]) 1963    PAST SURGICAL HISTORY: Past Surgical History:  Procedure Laterality Date  . ABDOMINAL HYSTERECTOMY    . APPENDECTOMY      . BREAST SURGERY     cyst removed from right breast  . EYE SURGERY Bilateral    Cataract Extractions  . IR FLUORO GUIDE CV LINE RIGHT  06/30/2017  . REVERSE SHOULDER ARTHROPLASTY Right 10/28/2014   Procedure: REVERSE SHOULDER ARTHROPLASTY;  Surgeon: Corky Mull, MD;  Location: [facility] ORS;  Service: Orthopedics;  Laterality: Right;    FAMILY HISTORY: Reviewed and unchanged. No reported history of malignancy or chronic disease.     ADVANCED DIRECTIVES:    HEALTH MAINTENANCE: Social History   Tobacco Use  . Smoking status: Former Smoker    Packs/day: 2.00    Types: Cigarettes  . Smokeless tobacco: Never Used  Substance Use Topics  . Alcohol use: Yes    Alcohol/week: 0.6 oz    Types: 1 Glasses of wine per week    Comment: 1 glass of wine daily  . Drug use: No     Colonoscopy:  PAP:  Bone density:  Lipid panel:  No Known Allergies  Current Outpatient Medications  Medication Sig Dispense Refill  . ALPRAZolam (XANAX) 0.25 MG tablet Take 1 tablet (0.25 mg total) by mouth daily. As needed. 30 tablet 0  . amLODipine (NORVASC) 10 MG tablet     . aspirin 81 MG tablet Take 81 mg by mouth daily.    . cyanocobalamin (,VITAMIN B-12,) 1000 MCG/ML injection Inject into the muscle.    . diphenhydrAMINE (BENADRYL) 50 MG tablet Take 50 mg by mouth at bedtime as needed for itching.    . Ferrous Sulfate (IRON SLOW RELEASE PO) Take by mouth.    . hydrALAZINE (APRESOLINE) 25 MG tablet Take by mouth.    Marland Kitchen  hydrochlorothiazide (HYDRODIURIL) 25 MG tablet Take 25 mg by mouth daily.    Marland Kitchen HYDROcodone-acetaminophen (NORCO/VICODIN) 5-325 MG tablet Take 1-2 tablets by mouth every 4 (four) hours as needed for moderate pain or severe pain. 60 tablet 0  . lisinopril (PRINIVIL,ZESTRIL) 40 MG tablet Take 40 mg by mouth every morning.    . SF 1.1 % GEL dental gel     . tiZANidine (ZANAFLEX) 2 MG tablet Take 1 tablet (2 mg total) by mouth at bedtime as needed for muscle spasms. 30 tablet 0  .  traMADol-acetaminophen (ULTRACET) 37.5-325 MG per tablet Take 1 tablet by mouth every 8 (eight) hours as needed.     No current facility-administered medications for this visit.     OBJECTIVE: There were no vitals filed for this visit.   There is no height or weight on file to calculate BMI.    ECOG FS:0 - Asymptomatic  General: Thin, no acute distress. Eyes: Pink conjunctiva, anicteric sclera. Chest wall: Port in place without erythema. Lungs: Clear to auscultation bilaterally. Heart: Regular rate and rhythm. No rubs, murmurs, or gallops. Abdomen: Soft, nontender, nondistended. No organomegaly noted, normoactive bowel sounds. Musculoskeletal: No edema, cyanosis, or clubbing. Neuro: Alert, answering all questions appropriately. Cranial nerves grossly intact. Skin: No rashes or petechiae noted. Psych: Normal affect. Lymphatics: No cervical, calvicular, axillary or inguinal LAD.   LAB RESULTS:  Lab Results  Component Value Date   NA 130 (L) 03/16/2017   K 3.3 (L) 03/16/2017   CL 97 (L) 03/16/2017   CO2 22 03/16/2017   GLUCOSE 147 (H) 03/16/2017   BUN 19 03/16/2017   CREATININE 0.90 03/16/2017   CALCIUM 9.5 03/16/2017   PROT 7.3 03/27/2013   ALBUMIN 4.1 03/27/2013   AST 12 (L) 03/27/2013   ALT 20 03/27/2013   ALKPHOS 87 03/27/2013   BILITOT 0.5 03/27/2013   GFRNONAA 57 (L) 03/16/2017   GFRAA >60 03/16/2017    Lab Results  Component Value Date   WBC 3.4 (L) 07/06/2017   NEUTROABS 1.9 07/06/2017   HGB 7.4 (L) 07/06/2017   HCT 21.3 (L) 07/06/2017   MCV 97.7 07/06/2017   PLT 387 07/06/2017   Lab Results  Component Value Date   IRON 198 (H) 07/06/2017   TIBC 278 07/06/2017   IRONPCTSAT 71 (H) 07/06/2017   Lab Results  Component Value Date   FERRITIN 434 (H) 07/06/2017     STUDIES: No results found.  ASSESSMENT: MDS 5q-, CLL   PLAN:    1. MDS, 5q-: Bone marrow biopsy results from June 30, 2017 reviewed independently confirming progression of patient's  MDS, 5q-. She has a hypercellular marrow for age as well as an increased blast count up to 6%.  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  Revlimid was once again discussed, but patient has declined treatment.  We also discussed the possibility of progressing to AML, but patient wishes no further interventions other than periodic blood transfusions.  Proceed with 1 unit of packed red blood cells today.  Return to clinic in 4 weeks with repeat laboratory work, consideration of additional blood, and further evaluation.   2.  Anemia: Repeat bone marrow biopsy as above.  Secondary to MDS.  Proceed with transfusion as above.  3.  Elevated iron stores: Continue IV Desferal with transfusions. 4.  CLL: Bone marrow biopsy did not mention any involvement of CLL.  White count is now slightly decreased.  CT scan results from June 09, 2014 were  independently reviewed and did not reveal any evidence of recurrent disease. All of her labwork is also either negative or within normal limits. Patient last received single agent Rituxan in February 2015.  5.  Anxiety: Patient was given a prescription for Xanax as needed.   Patient expressed understanding and was in agreement with this plan. She also understands that She can call clinic at any time with any questions, concerns, or complaints.   Lloyd Huger, MD   08/01/2017 12:05 AM

## 2017-08-02 ENCOUNTER — Telehealth: Payer: Self-pay | Admitting: *Deleted

## 2017-08-02 NOTE — Telephone Encounter (Signed)
Per Dr Grayland Ormond, patient to come in for labs tomorrow as planned and we will decide from there. Left message on patient voice mail

## 2017-08-02 NOTE — Telephone Encounter (Signed)
Patient called and reports that she has had diarrhea for 4 days and wants to know if she should put off her tomorrow and Friday appts until she is better. Please advise

## 2017-08-03 ENCOUNTER — Inpatient Hospital Stay: Payer: Medicare Other | Attending: Oncology

## 2017-08-03 DIAGNOSIS — C9111 Chronic lymphocytic leukemia of B-cell type in remission: Secondary | ICD-10-CM | POA: Insufficient documentation

## 2017-08-03 DIAGNOSIS — D46C Myelodysplastic syndrome with isolated del(5q) chromosomal abnormality: Secondary | ICD-10-CM | POA: Diagnosis not present

## 2017-08-03 DIAGNOSIS — C9110 Chronic lymphocytic leukemia of B-cell type not having achieved remission: Secondary | ICD-10-CM

## 2017-08-03 LAB — IRON AND TIBC
Iron: 241 ug/dL — ABNORMAL HIGH (ref 28–170)
Saturation Ratios: 83 % — ABNORMAL HIGH (ref 10.4–31.8)
TIBC: 291 ug/dL (ref 250–450)
UIBC: 50 ug/dL

## 2017-08-03 LAB — CBC WITH DIFFERENTIAL/PLATELET
Basophils Absolute: 0.1 10*3/uL (ref 0–0.1)
Basophils Relative: 2 %
Eosinophils Absolute: 0.1 10*3/uL (ref 0–0.7)
Eosinophils Relative: 2 %
HCT: 23.4 % — ABNORMAL LOW (ref 35.0–47.0)
Hemoglobin: 8.3 g/dL — ABNORMAL LOW (ref 12.0–16.0)
Lymphocytes Relative: 27 %
Lymphs Abs: 1 10*3/uL (ref 1.0–3.6)
MCH: 35.1 pg — ABNORMAL HIGH (ref 26.0–34.0)
MCHC: 35.3 g/dL (ref 32.0–36.0)
MCV: 99.4 fL (ref 80.0–100.0)
Monocytes Absolute: 0.5 10*3/uL (ref 0.2–0.9)
Monocytes Relative: 15 %
Neutro Abs: 1.9 10*3/uL (ref 1.4–6.5)
Neutrophils Relative %: 54 %
Platelets: 386 10*3/uL (ref 150–440)
RBC: 2.36 MIL/uL — ABNORMAL LOW (ref 3.80–5.20)
RDW: 28.5 % — ABNORMAL HIGH (ref 11.5–14.5)
WBC: 3.5 10*3/uL — ABNORMAL LOW (ref 3.6–11.0)

## 2017-08-03 LAB — SAMPLE TO BLOOD BANK

## 2017-08-03 LAB — FERRITIN: Ferritin: 578 ng/mL — ABNORMAL HIGH (ref 11–307)

## 2017-08-04 ENCOUNTER — Inpatient Hospital Stay: Payer: Medicare Other | Admitting: Oncology

## 2017-08-04 ENCOUNTER — Ambulatory Visit: Payer: Medicare Other

## 2017-08-21 ENCOUNTER — Ambulatory Visit (INDEPENDENT_AMBULATORY_CARE_PROVIDER_SITE_OTHER): Payer: BLUE CROSS/BLUE SHIELD | Admitting: Vascular Surgery

## 2017-08-21 ENCOUNTER — Encounter (INDEPENDENT_AMBULATORY_CARE_PROVIDER_SITE_OTHER): Payer: Medicare Other

## 2017-08-23 ENCOUNTER — Other Ambulatory Visit: Payer: Self-pay | Admitting: *Deleted

## 2017-08-23 MED ORDER — HYDROCODONE-ACETAMINOPHEN 5-325 MG PO TABS: 1.0000 | ORAL_TABLET | ORAL | 0 refills | Status: DC | PRN

## 2017-08-24 ENCOUNTER — Other Ambulatory Visit: Payer: Self-pay | Admitting: *Deleted

## 2017-08-24 MED ORDER — HYDROCODONE-ACETAMINOPHEN 5-325 MG PO TABS: 1.0000 | ORAL_TABLET | ORAL | 0 refills | Status: DC | PRN

## 2017-08-25 DIAGNOSIS — E1169 Type 2 diabetes mellitus with other specified complication: Secondary | ICD-10-CM | POA: Insufficient documentation

## 2017-08-27 NOTE — Progress Notes (Signed)
[place]  [phone] [phone]) [phone]  ID: Jacqueline Russo OB: 1929/07/10  MR#: [id number]  OEU#:[id number]  Patient Care Team: [name], MD as PCP - General (Internal Medicine)  CHIEF COMPLAINT: MDS 5q-, CLL  INTERVAL HISTORY: Patient returns to clinic today for further evaluation and consideration of additional blood.  She continues to have chronic weakness and fatigue.  She otherwise feels well and is asymptomatic.  She has no neurologic complaints. She denies any recent fevers or illnesses. She denies any night sweats or weight loss. She has no chest pain or shortness of breath. She denies any nausea, vomiting, constipation, or diarrhea. She denies any melena or hematochezia. She has no urinary complaints.  Patient offers no further specific complaints today.  REVIEW OF SYSTEMS:   Review of Systems  Constitutional: Positive for malaise/fatigue. Negative for fever and weight loss.  Respiratory: Negative.  Negative for cough and shortness of breath.   Cardiovascular: Negative.  Negative for chest pain and leg swelling.  Gastrointestinal: Negative.  Negative for abdominal pain, blood in stool and melena.  Genitourinary: Negative.   Musculoskeletal: Positive for joint pain.  Skin: Negative.  Negative for rash.  Neurological: Positive for weakness. Negative for sensory change.  Endo/Heme/Allergies: Does not bruise/bleed easily.  Psychiatric/Behavioral: Negative.  The patient is not nervous/anxious.     As per HPI. Otherwise, a complete review of systems is negative.  PAST MEDICAL HISTORY: Past Medical History:  Diagnosis Date  . Anemia    Myelodysplastic syndrome  . Arthritis   . Hypertension   . Leukemia, chronic lymphoid (HCC)    CLL  . Ovarian cancer ([place]) 1963    PAST SURGICAL HISTORY: Past Surgical History:  Procedure Laterality Date  . ABDOMINAL HYSTERECTOMY    . APPENDECTOMY    . BREAST SURGERY     cyst removed from right  breast  . EYE SURGERY Bilateral    Cataract Extractions  . IR FLUORO GUIDE CV LINE RIGHT  06/30/2017  . REVERSE SHOULDER ARTHROPLASTY Right 10/28/2014   Procedure: REVERSE SHOULDER ARTHROPLASTY;  Surgeon: Corky Mull, MD;  Location: [facility] ORS;  Service: Orthopedics;  Laterality: Right;    FAMILY HISTORY: Reviewed and unchanged. No reported history of malignancy or chronic disease.     ADVANCED DIRECTIVES:    HEALTH MAINTENANCE: Social History   Tobacco Use  . Smoking status: Former Smoker    Packs/day: 2.00    Types: Cigarettes  . Smokeless tobacco: Never Used  Substance Use Topics  . Alcohol use: Yes    Alcohol/week: 0.6 oz    Types: 1 Glasses of wine per week    Comment: 1 glass of wine daily  . Drug use: No     Colonoscopy:  PAP:  Bone density:  Lipid panel:  No Known Allergies  Current Outpatient Medications  Medication Sig Dispense Refill  . amLODipine (NORVASC) 10 MG tablet     . aspirin 81 MG tablet Take 81 mg by mouth daily.    . cyanocobalamin (,VITAMIN B-12,) 1000 MCG/ML injection Inject into the muscle.    . diphenhydrAMINE (BENADRYL) 50 MG tablet Take 50 mg by mouth at bedtime as needed for itching.    . hydrochlorothiazide (HYDRODIURIL) 25 MG tablet Take 25 mg by mouth daily.    Marland Kitchen HYDROcodone-acetaminophen (NORCO/VICODIN) 5-325 MG tablet Take 1-2 tablets by mouth every 4 (four) hours as needed for moderate pain or severe pain. 60 tablet 0  . olmesartan (BENICAR) 20 MG tablet Take by  mouth.    . tiZANidine (ZANAFLEX) 2 MG tablet Take 1 tablet (2 mg total) by mouth at bedtime as needed for muscle spasms. 30 tablet 0  . ALPRAZolam (XANAX) 0.25 MG tablet Take 1 tablet (0.25 mg total) by mouth daily. As needed. (Patient not taking: Reported on 09/01/2017) 30 tablet 0  . hydrALAZINE (APRESOLINE) 25 MG tablet Take by mouth.    . lidocaine-prilocaine (EMLA) cream Apply 1 application topically as needed. Apply to port 1-2 hours prior to appointment. Cover with plastic  wrap. 30 g 0  . SF 1.1 % GEL dental gel     . traMADol-acetaminophen (ULTRACET) 37.5-325 MG per tablet Take 1 tablet by mouth every 8 (eight) hours as needed.     No current facility-administered medications for this visit.     OBJECTIVE: Vitals:   09/01/17 0841  BP: (!) 204/69  Pulse: 98  Resp: 18  Temp: 97.7 F (36.5 C)     Body mass index is 19.42 kg/m.    ECOG FS:0 - Asymptomatic  General: Thin, no acute distress. Eyes: Pink conjunctiva, anicteric sclera. HEENT: Normocephalic, moist mucous membranes, clear oropharnyx. Lungs: Clear to auscultation bilaterally. Heart: Regular rate and rhythm. No rubs, murmurs, or gallops. Abdomen: Soft, nontender, nondistended. No organomegaly noted, normoactive bowel sounds. Musculoskeletal: No edema, cyanosis, or clubbing. Neuro: Alert, answering all questions appropriately. Cranial nerves grossly intact. Skin: No rashes or petechiae noted. Psych: Normal affect.  LAB RESULTS:  Lab Results  Component Value Date   NA 130 (L) 03/16/2017   K 3.3 (L) 03/16/2017   CL 97 (L) 03/16/2017   CO2 22 03/16/2017   GLUCOSE 147 (H) 03/16/2017   BUN 19 03/16/2017   CREATININE 0.90 03/16/2017   CALCIUM 9.5 03/16/2017   PROT 7.3 03/27/2013   ALBUMIN 4.1 03/27/2013   AST 12 (L) 03/27/2013   ALT 20 03/27/2013   ALKPHOS 87 03/27/2013   BILITOT 0.5 03/27/2013   GFRNONAA 57 (L) 03/16/2017   GFRAA >60 03/16/2017    Lab Results  Component Value Date   WBC 3.2 (L) 08/31/2017   NEUTROABS 1.8 08/31/2017   HGB 7.2 (L) 08/31/2017   HCT 20.8 (L) 08/31/2017   MCV 112.0 (H) 08/31/2017   PLT 454 (H) 08/31/2017   Lab Results  Component Value Date   IRON 187 (H) 08/31/2017   TIBC 311 08/31/2017   IRONPCTSAT 60 (H) 08/31/2017   Lab Results  Component Value Date   FERRITIN 441 (H) 08/31/2017     STUDIES: No results found.  ASSESSMENT: MDS 5q-, CLL   PLAN:    1. MDS, 5q-: Bone marrow biopsy results from June 30, 2017 reviewed  independently confirming progression of patient's MDS, 5q-. She has a hypercellular marrow for age as well as an increased blast count up to 6%.  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  Patient continues to decline treatment with Revlimid.  Previously, we also discussed the possibility of progressing to AML, but patient wishes no further interventions other than periodic blood transfusions.  Proceed with 1 unit of packed red blood cells today.  Return to clinic in 4 weeks for further evaluation and consideration of additional blood. 2.  Anemia: Repeat bone marrow biopsy as above.  Secondary to MDS.  Proceed with transfusion as above.  3.  Elevated iron stores: Continue IV Desferal with transfusions. 4.  CLL: Bone marrow biopsy did not mention any involvement of CLL.  White count is now slightly decreased.  CT scan results from June 09, 2014 were independently reviewed and did not reveal any evidence of recurrent disease. All of her labwork is also either negative or within normal limits. Patient last received single agent Rituxan in February 2015.  5.  Anxiety: Continue Xanax as needed. 6.  Hypertension: Patient's blood pressure is significantly elevated today.  Continue evaluation and treatment per primary care.  I spent a total of 30 minutes face-to-face with the patient of which greater than 50% of the visit was spent in counseling and coordination of care as detailed above.   Patient expressed understanding and was in agreement with this plan. She also understands that She can call clinic at any time with any questions, concerns, or complaints.   Lloyd Huger, MD   09/05/2017 8:47 AM

## 2017-08-31 ENCOUNTER — Inpatient Hospital Stay: Payer: Medicare Other | Attending: Oncology

## 2017-08-31 ENCOUNTER — Other Ambulatory Visit: Payer: Self-pay | Admitting: Oncology

## 2017-08-31 ENCOUNTER — Other Ambulatory Visit: Payer: Self-pay | Admitting: *Deleted

## 2017-08-31 DIAGNOSIS — Z79899 Other long term (current) drug therapy: Secondary | ICD-10-CM | POA: Insufficient documentation

## 2017-08-31 DIAGNOSIS — R5382 Chronic fatigue, unspecified: Secondary | ICD-10-CM | POA: Insufficient documentation

## 2017-08-31 DIAGNOSIS — C9110 Chronic lymphocytic leukemia of B-cell type not having achieved remission: Secondary | ICD-10-CM | POA: Insufficient documentation

## 2017-08-31 DIAGNOSIS — R531 Weakness: Secondary | ICD-10-CM | POA: Insufficient documentation

## 2017-08-31 DIAGNOSIS — Z8543 Personal history of malignant neoplasm of ovary: Secondary | ICD-10-CM | POA: Diagnosis not present

## 2017-08-31 DIAGNOSIS — D649 Anemia, unspecified: Secondary | ICD-10-CM

## 2017-08-31 DIAGNOSIS — F419 Anxiety disorder, unspecified: Secondary | ICD-10-CM | POA: Diagnosis not present

## 2017-08-31 DIAGNOSIS — M1990 Unspecified osteoarthritis, unspecified site: Secondary | ICD-10-CM | POA: Diagnosis not present

## 2017-08-31 DIAGNOSIS — Z87891 Personal history of nicotine dependence: Secondary | ICD-10-CM | POA: Insufficient documentation

## 2017-08-31 DIAGNOSIS — I10 Essential (primary) hypertension: Secondary | ICD-10-CM | POA: Diagnosis not present

## 2017-08-31 DIAGNOSIS — D638 Anemia in other chronic diseases classified elsewhere: Secondary | ICD-10-CM | POA: Diagnosis not present

## 2017-08-31 DIAGNOSIS — R790 Abnormal level of blood mineral: Secondary | ICD-10-CM | POA: Diagnosis not present

## 2017-08-31 DIAGNOSIS — D469 Myelodysplastic syndrome, unspecified: Secondary | ICD-10-CM

## 2017-08-31 DIAGNOSIS — R5381 Other malaise: Secondary | ICD-10-CM | POA: Insufficient documentation

## 2017-08-31 DIAGNOSIS — Z7982 Long term (current) use of aspirin: Secondary | ICD-10-CM | POA: Insufficient documentation

## 2017-08-31 DIAGNOSIS — D46C Myelodysplastic syndrome with isolated del(5q) chromosomal abnormality: Secondary | ICD-10-CM | POA: Insufficient documentation

## 2017-08-31 LAB — CBC WITH DIFFERENTIAL/PLATELET
Basophils Absolute: 0.1 10*3/uL (ref 0–0.1)
Basophils Relative: 4 %
Eosinophils Absolute: 0.1 10*3/uL (ref 0–0.7)
Eosinophils Relative: 4 %
HCT: 20.8 % — ABNORMAL LOW (ref 35.0–47.0)
Hemoglobin: 7.2 g/dL — ABNORMAL LOW (ref 12.0–16.0)
Lymphocytes Relative: 23 %
Lymphs Abs: 0.8 10*3/uL — ABNORMAL LOW (ref 1.0–3.6)
MCH: 38.6 pg — ABNORMAL HIGH (ref 26.0–34.0)
MCHC: 34.5 g/dL (ref 32.0–36.0)
MCV: 112 fL — ABNORMAL HIGH (ref 80.0–100.0)
Monocytes Absolute: 0.4 10*3/uL (ref 0.2–0.9)
Monocytes Relative: 13 %
Neutro Abs: 1.8 10*3/uL (ref 1.4–6.5)
Neutrophils Relative %: 56 %
Platelets: 454 10*3/uL — ABNORMAL HIGH (ref 150–440)
RBC: 1.86 MIL/uL — ABNORMAL LOW (ref 3.80–5.20)
RDW: 24.6 % — ABNORMAL HIGH (ref 11.5–14.5)
WBC: 3.2 10*3/uL — ABNORMAL LOW (ref 3.6–11.0)

## 2017-08-31 LAB — IRON AND TIBC
Iron: 187 ug/dL — ABNORMAL HIGH (ref 28–170)
Saturation Ratios: 60 % — ABNORMAL HIGH (ref 10.4–31.8)
TIBC: 311 ug/dL (ref 250–450)
UIBC: 124 ug/dL

## 2017-08-31 LAB — PREPARE RBC (CROSSMATCH)

## 2017-08-31 LAB — FERRITIN: Ferritin: 441 ng/mL — ABNORMAL HIGH (ref 11–307)

## 2017-09-01 ENCOUNTER — Other Ambulatory Visit: Payer: Self-pay

## 2017-09-01 ENCOUNTER — Ambulatory Visit: Payer: Medicare Other

## 2017-09-01 ENCOUNTER — Inpatient Hospital Stay (HOSPITAL_BASED_OUTPATIENT_CLINIC_OR_DEPARTMENT_OTHER): Payer: Medicare Other | Admitting: Oncology

## 2017-09-01 VITALS — BP 186/67 | HR 91 | Temp 98.3°F | Resp 20

## 2017-09-01 VITALS — BP 204/69 | HR 98 | Temp 97.7°F | Resp 18 | Wt 99.4 lb

## 2017-09-01 DIAGNOSIS — R531 Weakness: Secondary | ICD-10-CM | POA: Diagnosis not present

## 2017-09-01 DIAGNOSIS — M1990 Unspecified osteoarthritis, unspecified site: Secondary | ICD-10-CM | POA: Diagnosis not present

## 2017-09-01 DIAGNOSIS — R5382 Chronic fatigue, unspecified: Secondary | ICD-10-CM | POA: Diagnosis not present

## 2017-09-01 DIAGNOSIS — F419 Anxiety disorder, unspecified: Secondary | ICD-10-CM

## 2017-09-01 DIAGNOSIS — R5381 Other malaise: Secondary | ICD-10-CM | POA: Diagnosis not present

## 2017-09-01 DIAGNOSIS — C9110 Chronic lymphocytic leukemia of B-cell type not having achieved remission: Secondary | ICD-10-CM | POA: Diagnosis not present

## 2017-09-01 DIAGNOSIS — D46C Myelodysplastic syndrome with isolated del(5q) chromosomal abnormality: Secondary | ICD-10-CM | POA: Diagnosis not present

## 2017-09-01 DIAGNOSIS — I10 Essential (primary) hypertension: Secondary | ICD-10-CM

## 2017-09-01 DIAGNOSIS — D638 Anemia in other chronic diseases classified elsewhere: Secondary | ICD-10-CM | POA: Diagnosis not present

## 2017-09-01 DIAGNOSIS — Z7982 Long term (current) use of aspirin: Secondary | ICD-10-CM

## 2017-09-01 DIAGNOSIS — D649 Anemia, unspecified: Secondary | ICD-10-CM

## 2017-09-01 DIAGNOSIS — D469 Myelodysplastic syndrome, unspecified: Secondary | ICD-10-CM

## 2017-09-01 DIAGNOSIS — Z8543 Personal history of malignant neoplasm of ovary: Secondary | ICD-10-CM

## 2017-09-01 DIAGNOSIS — R790 Abnormal level of blood mineral: Secondary | ICD-10-CM | POA: Diagnosis not present

## 2017-09-01 DIAGNOSIS — Z79899 Other long term (current) drug therapy: Secondary | ICD-10-CM

## 2017-09-01 MED ORDER — ACETAMINOPHEN 325 MG PO TABS
650.0000 mg | ORAL_TABLET | Freq: Once | ORAL | Status: AC
  Administered 2017-09-01: 650 mg via ORAL
  Filled 2017-09-01: qty 2

## 2017-09-01 MED ORDER — SODIUM CHLORIDE 0.9% FLUSH
3.0000 mL | INTRAVENOUS | Status: AC | PRN
  Administered 2017-09-01: 10 mL
  Filled 2017-09-01: qty 3

## 2017-09-01 MED ORDER — DIPHENHYDRAMINE HCL 50 MG/ML IJ SOLN
25.0000 mg | Freq: Once | INTRAMUSCULAR | Status: AC
  Administered 2017-09-01: 25 mg via INTRAVENOUS
  Filled 2017-09-01: qty 1

## 2017-09-01 MED ORDER — DEFEROXAMINE MESYLATE 2 G IJ SOLR
15.0000 mg/kg/h | Freq: Once | INTRAMUSCULAR | Status: AC
  Administered 2017-09-01: 15 mg/kg/h via INTRAVENOUS
  Filled 2017-09-01: qty 2

## 2017-09-01 MED ORDER — SODIUM CHLORIDE 0.9 % IV SOLN
250.0000 mL | Freq: Once | INTRAVENOUS | Status: AC
  Administered 2017-09-01: 250 mL via INTRAVENOUS
  Filled 2017-09-01: qty 250

## 2017-09-01 MED ORDER — HEPARIN SOD (PORK) LOCK FLUSH 100 UNIT/ML IV SOLN
250.0000 [IU] | INTRAVENOUS | Status: AC | PRN
  Administered 2017-09-01: 500 [IU]

## 2017-09-01 MED ORDER — LIDOCAINE-PRILOCAINE 2.5-2.5 % EX CREA: 1.0000 "application " | TOPICAL_CREAM | CUTANEOUS | 0 refills | Status: DC | PRN

## 2017-09-01 NOTE — Progress Notes (Signed)
Here for follow up. Overall stated she does not feel well- feeling tried and nervous. Note med changes made -started Benicar.  bp 204/69 -pt denies symptoms stated BP often elevated and saw DR Sabra Heck w BP issues. DR Grayland Ormond aware of BP

## 2017-09-01 NOTE — Progress Notes (Signed)
UNMATCHED BLOOD PRODUCT NOTE  Compare the patient ID on the blood tag to the patient ID on the hospital armband and Blood Bank armband. Then confirm the unit number on the blood tag matches the unit number on the blood product.  If a discrepancy is discovered return the product to blood bank immediately.   Blood Product Type: Red Blood Cells  Unit #: [id number]  Product Code #: A0601V61  Start Time: 1250  Starting Rate: 120 ml/hr  Rate increase/decreased  (if applicable):      ml/hr  Rate changed time (if applicable):    Stop Time: 5379   All Other Documentation should be documented within the Blood Admin Flowsheet per policy.

## 2017-09-01 NOTE — Patient Instructions (Signed)

## 2017-09-02 LAB — TYPE AND SCREEN
ABO/RH(D): AB POS
Antibody Screen: NEGATIVE
Unit division: 0
Unit division: 0

## 2017-09-02 LAB — BPAM RBC
Blood Product Expiration Date: 201906182359
Blood Product Expiration Date: 201907042359
ISSUE DATE / TIME: 201906141234
Unit Type and Rh: 600
Unit Type and Rh: 6200

## 2017-09-24 NOTE — Progress Notes (Signed)
[place]  Telephone:(336) [phone] [phone]) [phone]  ID: Jacqueline Russo OB: July 15, 1929  MR#: [id number]  YWV#:[id number]  Patient Care Team: [name], MD as PCP - General (Internal Medicine)  CHIEF COMPLAINT: MDS 5q-, CLL  INTERVAL HISTORY: Patient returns to clinic today for further evaluation and consideration of blood transfusion.  She continues to have chronic weakness and fatigue, but overall this has remained stable.  She has no neurologic complaints. She denies any recent fevers or illnesses. She denies any night sweats or weight loss. She has no chest pain or shortness of breath. She denies any nausea, vomiting, constipation, or diarrhea. She denies any melena or hematochezia. She has no urinary complaints.  Patient offers no further specific complaints today.  REVIEW OF SYSTEMS:   Review of Systems  Constitutional: Positive for malaise/fatigue. Negative for fever and weight loss.  Respiratory: Negative.  Negative for cough and shortness of breath.   Cardiovascular: Negative.  Negative for chest pain and leg swelling.  Gastrointestinal: Negative.  Negative for abdominal pain, blood in stool and melena.  Genitourinary: Negative.  Negative for dysuria.  Musculoskeletal: Positive for joint pain.  Skin: Negative.  Negative for rash.  Neurological: Positive for weakness. Negative for dizziness, sensory change, focal weakness and headaches.  Endo/Heme/Allergies: Does not bruise/bleed easily.  Psychiatric/Behavioral: Negative.  The patient is not nervous/anxious.     As per HPI. Otherwise, a complete review of systems is negative.  PAST MEDICAL HISTORY: Past Medical History:  Diagnosis Date  . Anemia    Myelodysplastic syndrome  . Arthritis   . Hypertension   . Leukemia, chronic lymphoid (HCC)    CLL  . Ovarian cancer ([place]) 1963    PAST SURGICAL HISTORY: Past Surgical History:  Procedure Laterality Date  . ABDOMINAL HYSTERECTOMY    .  APPENDECTOMY    . BREAST SURGERY     cyst removed from right breast  . EYE SURGERY Bilateral    Cataract Extractions  . IR FLUORO GUIDE CV LINE RIGHT  06/30/2017  . REVERSE SHOULDER ARTHROPLASTY Right 10/28/2014   Procedure: REVERSE SHOULDER ARTHROPLASTY;  Surgeon: Corky Mull, MD;  Location: [facility] ORS;  Service: Orthopedics;  Laterality: Right;    FAMILY HISTORY: Reviewed and unchanged. No reported history of malignancy or chronic disease.     ADVANCED DIRECTIVES:    HEALTH MAINTENANCE: Social History   Tobacco Use  . Smoking status: Former Smoker    Packs/day: 2.00    Types: Cigarettes  . Smokeless tobacco: Never Used  Substance Use Topics  . Alcohol use: Yes    Alcohol/week: 0.6 oz    Types: 1 Glasses of wine per week    Comment: 1 glass of wine daily  . Drug use: No     Colonoscopy:  PAP:  Bone density:  Lipid panel:  No Known Allergies  Current Outpatient Medications  Medication Sig Dispense Refill  . amLODipine (NORVASC) 10 MG tablet     . aspirin 81 MG tablet Take 81 mg by mouth daily.    . cyanocobalamin (,VITAMIN B-12,) 1000 MCG/ML injection Inject into the muscle.    . diphenhydrAMINE (BENADRYL) 50 MG tablet Take 50 mg by mouth at bedtime as needed for itching.    . hydrALAZINE (APRESOLINE) 25 MG tablet Take by mouth.     . hydrochlorothiazide (HYDRODIURIL) 12.5 MG tablet Take 12.5 mg by mouth daily.     Marland Kitchen HYDROcodone-acetaminophen (NORCO/VICODIN) 5-325 MG tablet Take 1-2 tablets by mouth every 4 (four) hours  as needed for moderate pain or severe pain. 60 tablet 0  . olmesartan (BENICAR) 20 MG tablet Take by mouth.    Marland Kitchen tiZANidine (ZANAFLEX) 2 MG tablet Take 1 tablet (2 mg total) by mouth at bedtime as needed for muscle spasms. 30 tablet 0  . ALPRAZolam (XANAX) 0.25 MG tablet Take 1 tablet (0.25 mg total) by mouth daily. As needed. (Patient not taking: Reported on 09/01/2017) 30 tablet 0  . lidocaine-prilocaine (EMLA) cream Apply 1 application topically as  needed. Apply to port 1-2 hours prior to appointment. Cover with plastic wrap. (Patient not taking: Reported on 09/29/2017) 30 g 0  . SF 1.1 % GEL dental gel     . traMADol-acetaminophen (ULTRACET) 37.5-325 MG per tablet Take 1 tablet by mouth every 8 (eight) hours as needed.     No current facility-administered medications for this visit.     OBJECTIVE: Vitals:   09/29/17 0845  BP: (!) 216/65  Pulse: 76  Resp: 18  Temp: (!) 97.5 F (36.4 C)     Body mass index is 19.44 kg/m.    ECOG FS:0 - Asymptomatic  General: Thin, no acute distress. Eyes: Pink conjunctiva, anicteric sclera. HEENT: Normocephalic, moist mucous membranes, clear oropharnyx. Lungs: Clear to auscultation bilaterally. Heart: Regular rate and rhythm. No rubs, murmurs, or gallops. Abdomen: Soft, nontender, nondistended. No organomegaly noted, normoactive bowel sounds. Musculoskeletal: No edema, cyanosis, or clubbing. Neuro: Alert, answering all questions appropriately. Cranial nerves grossly intact. Skin: No rashes or petechiae noted. Psych: Normal affect.  LAB RESULTS:  Lab Results  Component Value Date   NA 130 (L) 03/16/2017   K 3.3 (L) 03/16/2017   CL 97 (L) 03/16/2017   CO2 22 03/16/2017   GLUCOSE 147 (H) 03/16/2017   BUN 19 03/16/2017   CREATININE 0.90 03/16/2017   CALCIUM 9.5 03/16/2017   PROT 7.3 03/27/2013   ALBUMIN 4.1 03/27/2013   AST 12 (L) 03/27/2013   ALT 20 03/27/2013   ALKPHOS 87 03/27/2013   BILITOT 0.5 03/27/2013   GFRNONAA 57 (L) 03/16/2017   GFRAA >60 03/16/2017    Lab Results  Component Value Date   WBC 4.9 09/29/2017   NEUTROABS 3.4 09/29/2017   HGB 8.4 (L) 09/29/2017   HCT 24.5 (L) 09/29/2017   MCV 114.0 (H) 09/29/2017   PLT 340 09/29/2017   Lab Results  Component Value Date   IRON 210 (H) 09/29/2017   TIBC 327 09/29/2017   IRONPCTSAT 64 (H) 09/29/2017   Lab Results  Component Value Date   FERRITIN 498 (H) 09/29/2017     STUDIES: No results  found.  ASSESSMENT: MDS 5q-, CLL   PLAN:    1. MDS, 5q-: Bone marrow biopsy results from June 30, 2017 reviewed independently confirming progression of patient's MDS, 5q-. She has a hypercellular marrow for age as well as an increased blast count up to 6%.  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  Patient continues to decline treatment with Revlimid.  Previously, we also discussed the possibility of progressing to AML, but patient wishes no further interventions other than periodic blood transfusions.  Patient does not require 1 unit of packed red blood cells on Monday.  No intervention is needed.  Return to clinic in 4 weeks for further evaluation and consideration of transfusion.  Patient also requires does follow with all of her transfusions.   2.  Anemia: Repeat bone marrow biopsy as above.  Secondary to MDS.  No transfusion required at  this time.   3.  Elevated iron stores: Continue IV Desferal with transfusions. 4.  CLL: Bone marrow biopsy did not mention any involvement of CLL.  White count is now slightly decreased.  CT scan results from June 09, 2014 were independently reviewed and did not reveal any evidence of recurrent disease. All of her labwork is also either negative or within normal limits. Patient last received single agent Rituxan in February 2015.  5.  Anxiety: Continue Xanax as needed. 6.  Hypertension: Patient blood pressure is significantly elevated today.  She reports she did not take her medications as prescribed.  Results were faxed to primary care physician for further evaluation and follow-up.  I spent a total of 30 minutes face-to-face with the patient of which greater than 50% of the visit was spent in counseling and coordination of care as detailed above.   Patient expressed understanding and was in agreement with this plan. She also understands that She can call clinic at any time with any questions, concerns, or complaints.    Lloyd Huger, MD   10/04/2017 2:23 PM

## 2017-09-28 ENCOUNTER — Other Ambulatory Visit: Payer: Medicare Other

## 2017-09-29 ENCOUNTER — Inpatient Hospital Stay: Payer: Medicare Other

## 2017-09-29 ENCOUNTER — Other Ambulatory Visit: Payer: Self-pay

## 2017-09-29 ENCOUNTER — Inpatient Hospital Stay: Payer: Medicare Other | Attending: Oncology | Admitting: Oncology

## 2017-09-29 ENCOUNTER — Ambulatory Visit: Payer: Medicare Other

## 2017-09-29 VITALS — BP 216/65 | HR 76 | Temp 97.5°F | Resp 18 | Wt 99.5 lb

## 2017-09-29 DIAGNOSIS — I10 Essential (primary) hypertension: Secondary | ICD-10-CM | POA: Insufficient documentation

## 2017-09-29 DIAGNOSIS — M1990 Unspecified osteoarthritis, unspecified site: Secondary | ICD-10-CM | POA: Diagnosis not present

## 2017-09-29 DIAGNOSIS — D638 Anemia in other chronic diseases classified elsewhere: Secondary | ICD-10-CM | POA: Insufficient documentation

## 2017-09-29 DIAGNOSIS — C9110 Chronic lymphocytic leukemia of B-cell type not having achieved remission: Secondary | ICD-10-CM | POA: Insufficient documentation

## 2017-09-29 DIAGNOSIS — D46C Myelodysplastic syndrome with isolated del(5q) chromosomal abnormality: Secondary | ICD-10-CM | POA: Insufficient documentation

## 2017-09-29 DIAGNOSIS — Z7982 Long term (current) use of aspirin: Secondary | ICD-10-CM | POA: Insufficient documentation

## 2017-09-29 DIAGNOSIS — Z79899 Other long term (current) drug therapy: Secondary | ICD-10-CM | POA: Diagnosis not present

## 2017-09-29 DIAGNOSIS — F419 Anxiety disorder, unspecified: Secondary | ICD-10-CM | POA: Diagnosis not present

## 2017-09-29 DIAGNOSIS — Z8543 Personal history of malignant neoplasm of ovary: Secondary | ICD-10-CM | POA: Diagnosis not present

## 2017-09-29 DIAGNOSIS — R5381 Other malaise: Secondary | ICD-10-CM | POA: Insufficient documentation

## 2017-09-29 DIAGNOSIS — R531 Weakness: Secondary | ICD-10-CM | POA: Insufficient documentation

## 2017-09-29 DIAGNOSIS — Z87891 Personal history of nicotine dependence: Secondary | ICD-10-CM | POA: Diagnosis not present

## 2017-09-29 DIAGNOSIS — R5382 Chronic fatigue, unspecified: Secondary | ICD-10-CM | POA: Diagnosis not present

## 2017-09-29 DIAGNOSIS — C9190 Lymphoid leukemia, unspecified not having achieved remission: Secondary | ICD-10-CM

## 2017-09-29 DIAGNOSIS — Z90710 Acquired absence of both cervix and uterus: Secondary | ICD-10-CM | POA: Diagnosis not present

## 2017-09-29 LAB — CBC WITH DIFFERENTIAL/PLATELET
Basophils Absolute: 0.1 10*3/uL (ref 0–0.1)
Basophils Relative: 2 %
Eosinophils Absolute: 0.2 10*3/uL (ref 0–0.7)
Eosinophils Relative: 3 %
HCT: 24.5 % — ABNORMAL LOW (ref 35.0–47.0)
Hemoglobin: 8.4 g/dL — ABNORMAL LOW (ref 12.0–16.0)
Lymphocytes Relative: 18 %
Lymphs Abs: 0.9 10*3/uL — ABNORMAL LOW (ref 1.0–3.6)
MCH: 39.1 pg — ABNORMAL HIGH (ref 26.0–34.0)
MCHC: 34.3 g/dL (ref 32.0–36.0)
MCV: 114 fL — ABNORMAL HIGH (ref 80.0–100.0)
Monocytes Absolute: 0.4 10*3/uL (ref 0.2–0.9)
Monocytes Relative: 8 %
Neutro Abs: 3.4 10*3/uL (ref 1.4–6.5)
Neutrophils Relative %: 69 %
Platelets: 340 10*3/uL (ref 150–440)
RBC: 2.15 MIL/uL — ABNORMAL LOW (ref 3.80–5.20)
RDW: 22.4 % — ABNORMAL HIGH (ref 11.5–14.5)
WBC: 4.9 10*3/uL (ref 3.6–11.0)

## 2017-09-29 LAB — SAMPLE TO BLOOD BANK

## 2017-09-29 LAB — IRON AND TIBC
Iron: 210 ug/dL — ABNORMAL HIGH (ref 28–170)
Saturation Ratios: 64 % — ABNORMAL HIGH (ref 10.4–31.8)
TIBC: 327 ug/dL (ref 250–450)
UIBC: 118 ug/dL

## 2017-09-29 LAB — FERRITIN: Ferritin: 498 ng/mL — ABNORMAL HIGH (ref 11–307)

## 2017-09-29 NOTE — Progress Notes (Signed)
Here for follow up. Stated " Im doing ok -running on fumes-Im tired " pt bp 216/65  Pt asymptomatic- no dizziness,no headache . Pt stated " I have white coat syndrome " Dr Grayland Ormond informed

## 2017-09-29 NOTE — Progress Notes (Signed)
Pt given new handicap form per Dr Gary Fleet request,  copy made for medical records.

## 2017-10-02 ENCOUNTER — Ambulatory Visit (INDEPENDENT_AMBULATORY_CARE_PROVIDER_SITE_OTHER): Payer: Medicare Other | Admitting: Vascular Surgery

## 2017-10-02 ENCOUNTER — Encounter (INDEPENDENT_AMBULATORY_CARE_PROVIDER_SITE_OTHER): Payer: Medicare Other

## 2017-10-02 ENCOUNTER — Ambulatory Visit: Payer: Medicare Other

## 2017-10-05 ENCOUNTER — Encounter: Payer: Self-pay | Admitting: Oncology

## 2017-10-19 ENCOUNTER — Other Ambulatory Visit: Payer: Self-pay | Admitting: *Deleted

## 2017-10-19 MED ORDER — HYDROCODONE-ACETAMINOPHEN 5-325 MG PO TABS: 1.0000 | ORAL_TABLET | ORAL | 0 refills | Status: DC | PRN

## 2017-10-23 NOTE — Progress Notes (Deleted)
[place]  Telephone:(336) [phone] Fax:(336) [phone]  ID: Chaney Born OB: 09-23-1929  MR#: [id number]  ZJI#:[id number]  Patient Care Team: [name], MD as PCP - General (Internal Medicine)  CHIEF COMPLAINT: MDS 5q-, CLL  INTERVAL HISTORY: Patient returns to clinic today for further evaluation and consideration of blood transfusion.  She continues to have chronic weakness and fatigue, but overall this has remained stable.  She has no neurologic complaints. She denies any recent fevers or illnesses. She denies any night sweats or weight loss. She has no chest pain or shortness of breath. She denies any nausea, vomiting, constipation, or diarrhea. She denies any melena or hematochezia. She has no urinary complaints.  Patient offers no further specific complaints today.  REVIEW OF SYSTEMS:   Review of Systems  Constitutional: Positive for malaise/fatigue. Negative for fever and weight loss.  Respiratory: Negative.  Negative for cough and shortness of breath.   Cardiovascular: Negative.  Negative for chest pain and leg swelling.  Gastrointestinal: Negative.  Negative for abdominal pain, blood in stool and melena.  Genitourinary: Negative.  Negative for dysuria.  Musculoskeletal: Positive for joint pain.  Skin: Negative.  Negative for rash.  Neurological: Positive for weakness. Negative for dizziness, sensory change, focal weakness and headaches.  Endo/Heme/Allergies: Does not bruise/bleed easily.  Psychiatric/Behavioral: Negative.  The patient is not nervous/anxious.     As per HPI. Otherwise, a complete review of systems is negative.  PAST MEDICAL HISTORY: Past Medical History:  Diagnosis Date  . Anemia    Myelodysplastic syndrome  . Arthritis   . Hypertension   . Leukemia, chronic lymphoid (HCC)    CLL  . Ovarian cancer ([place]) 1963    PAST SURGICAL HISTORY: Past Surgical History:  Procedure Laterality Date  . ABDOMINAL HYSTERECTOMY    .  APPENDECTOMY    . BREAST SURGERY     cyst removed from right breast  . EYE SURGERY Bilateral    Cataract Extractions  . IR FLUORO GUIDE CV LINE RIGHT  06/30/2017  . REVERSE SHOULDER ARTHROPLASTY Right 10/28/2014   Procedure: REVERSE SHOULDER ARTHROPLASTY;  Surgeon: Corky Mull, MD;  Location: [facility] ORS;  Service: Orthopedics;  Laterality: Right;    FAMILY HISTORY: Reviewed and unchanged. No reported history of malignancy or chronic disease.     ADVANCED DIRECTIVES:    HEALTH MAINTENANCE: Social History   Tobacco Use  . Smoking status: Former Smoker    Packs/day: 2.00    Types: Cigarettes  . Smokeless tobacco: Never Used  Substance Use Topics  . Alcohol use: Yes    Alcohol/week: 0.6 oz    Types: 1 Glasses of wine per week    Comment: 1 glass of wine daily  . Drug use: No     Colonoscopy:  PAP:  Bone density:  Lipid panel:  No Known Allergies  Current Outpatient Medications  Medication Sig Dispense Refill  . ALPRAZolam (XANAX) 0.25 MG tablet Take 1 tablet (0.25 mg total) by mouth daily. As needed. (Patient not taking: Reported on 09/01/2017) 30 tablet 0  . amLODipine (NORVASC) 10 MG tablet     . aspirin 81 MG tablet Take 81 mg by mouth daily.    . cyanocobalamin (,VITAMIN B-12,) 1000 MCG/ML injection Inject into the muscle.    . diphenhydrAMINE (BENADRYL) 50 MG tablet Take 50 mg by mouth at bedtime as needed for itching.    . hydrALAZINE (APRESOLINE) 25 MG tablet Take by mouth.     . hydrochlorothiazide (HYDRODIURIL) 12.5  MG tablet Take 12.5 mg by mouth daily.     Marland Kitchen HYDROcodone-acetaminophen (NORCO/VICODIN) 5-325 MG tablet Take 1-2 tablets by mouth every 4 (four) hours as needed for moderate pain or severe pain. 60 tablet 0  . lidocaine-prilocaine (EMLA) cream Apply 1 application topically as needed. Apply to port 1-2 hours prior to appointment. Cover with plastic wrap. (Patient not taking: Reported on 09/29/2017) 30 g 0  . olmesartan (BENICAR) 20 MG tablet Take by mouth.     . SF 1.1 % GEL dental gel     . tiZANidine (ZANAFLEX) 2 MG tablet Take 1 tablet (2 mg total) by mouth at bedtime as needed for muscle spasms. 30 tablet 0  . traMADol-acetaminophen (ULTRACET) 37.5-325 MG per tablet Take 1 tablet by mouth every 8 (eight) hours as needed.     No current facility-administered medications for this visit.     OBJECTIVE: There were no vitals filed for this visit.   There is no height or weight on file to calculate BMI.    ECOG FS:0 - Asymptomatic  General: Thin, no acute distress. Eyes: Pink conjunctiva, anicteric sclera. HEENT: Normocephalic, moist mucous membranes, clear oropharnyx. Lungs: Clear to auscultation bilaterally. Heart: Regular rate and rhythm. No rubs, murmurs, or gallops. Abdomen: Soft, nontender, nondistended. No organomegaly noted, normoactive bowel sounds. Musculoskeletal: No edema, cyanosis, or clubbing. Neuro: Alert, answering all questions appropriately. Cranial nerves grossly intact. Skin: No rashes or petechiae noted. Psych: Normal affect.  LAB RESULTS:  Lab Results  Component Value Date   NA 130 (L) 03/16/2017   K 3.3 (L) 03/16/2017   CL 97 (L) 03/16/2017   CO2 22 03/16/2017   GLUCOSE 147 (H) 03/16/2017   BUN 19 03/16/2017   CREATININE 0.90 03/16/2017   CALCIUM 9.5 03/16/2017   PROT 7.3 03/27/2013   ALBUMIN 4.1 03/27/2013   AST 12 (L) 03/27/2013   ALT 20 03/27/2013   ALKPHOS 87 03/27/2013   BILITOT 0.5 03/27/2013   GFRNONAA 57 (L) 03/16/2017   GFRAA >60 03/16/2017    Lab Results  Component Value Date   WBC 4.9 09/29/2017   NEUTROABS 3.4 09/29/2017   HGB 8.4 (L) 09/29/2017   HCT 24.5 (L) 09/29/2017   MCV 114.0 (H) 09/29/2017   PLT 340 09/29/2017   Lab Results  Component Value Date   IRON 210 (H) 09/29/2017   TIBC 327 09/29/2017   IRONPCTSAT 64 (H) 09/29/2017   Lab Results  Component Value Date   FERRITIN 498 (H) 09/29/2017     STUDIES: No results found.  ASSESSMENT: MDS 5q-, CLL   PLAN:    1.  MDS, 5q-: Bone marrow biopsy results from June 30, 2017 reviewed independently confirming progression of patient's MDS, 5q-. She has a hypercellular marrow for age as well as an increased blast count up to 6%.  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  Patient continues to decline treatment with Revlimid.  Previously, we also discussed the possibility of progressing to AML, but patient wishes no further interventions other than periodic blood transfusions.  Patient does not require 1 unit of packed red blood cells on Monday.  No intervention is needed.  Return to clinic in 4 weeks for further evaluation and consideration of transfusion.  Patient also requires does follow with all of her transfusions.   2.  Anemia: Repeat bone marrow biopsy as above.  Secondary to MDS.  No transfusion required at this time.   3.  Elevated iron stores: Continue IV  Desferal with transfusions. 4.  CLL: Bone marrow biopsy did not mention any involvement of CLL.  White count is now slightly decreased.  CT scan results from June 09, 2014 were independently reviewed and did not reveal any evidence of recurrent disease. All of her labwork is also either negative or within normal limits. Patient last received single agent Rituxan in February 2015.  5.  Anxiety: Continue Xanax as needed. 6.  Hypertension: Patient blood pressure is significantly elevated today.  She reports she did not take her medications as prescribed.  Results were faxed to primary care physician for further evaluation and follow-up.  I spent a total of 30 minutes face-to-face with the patient of which greater than 50% of the visit was spent in counseling and coordination of care as detailed above.   Patient expressed understanding and was in agreement with this plan. She also understands that She can call clinic at any time with any questions, concerns, or complaints.   Lloyd Huger, MD   10/23/2017 2:30  PM

## 2017-10-26 ENCOUNTER — Inpatient Hospital Stay: Payer: Medicare Other | Attending: Oncology

## 2017-10-26 DIAGNOSIS — D46C Myelodysplastic syndrome with isolated del(5q) chromosomal abnormality: Secondary | ICD-10-CM | POA: Diagnosis not present

## 2017-10-26 DIAGNOSIS — D638 Anemia in other chronic diseases classified elsewhere: Secondary | ICD-10-CM | POA: Diagnosis not present

## 2017-10-26 DIAGNOSIS — C9190 Lymphoid leukemia, unspecified not having achieved remission: Secondary | ICD-10-CM | POA: Insufficient documentation

## 2017-10-26 LAB — CBC WITH DIFFERENTIAL/PLATELET
Basophils Absolute: 0.1 10*3/uL (ref 0–0.1)
Basophils Relative: 4 %
Eosinophils Absolute: 0.1 10*3/uL (ref 0–0.7)
Eosinophils Relative: 4 %
HCT: 23.7 % — ABNORMAL LOW (ref 35.0–47.0)
Hemoglobin: 8.1 g/dL — ABNORMAL LOW (ref 12.0–16.0)
Lymphocytes Relative: 27 %
Lymphs Abs: 0.9 10*3/uL — ABNORMAL LOW (ref 1.0–3.6)
MCH: 40.4 pg — ABNORMAL HIGH (ref 26.0–34.0)
MCHC: 34.4 g/dL (ref 32.0–36.0)
MCV: 117.5 fL — ABNORMAL HIGH (ref 80.0–100.0)
Monocytes Absolute: 0.4 10*3/uL (ref 0.2–0.9)
Monocytes Relative: 11 %
Neutro Abs: 1.9 10*3/uL (ref 1.4–6.5)
Neutrophils Relative %: 54 %
Platelets: 340 10*3/uL (ref 150–440)
RBC: 2.02 MIL/uL — ABNORMAL LOW (ref 3.80–5.20)
RDW: 21.1 % — ABNORMAL HIGH (ref 11.5–14.5)
WBC: 3.4 10*3/uL — ABNORMAL LOW (ref 3.6–11.0)

## 2017-10-26 LAB — IRON AND TIBC
Iron: 220 ug/dL — ABNORMAL HIGH (ref 28–170)
Saturation Ratios: 57 % — ABNORMAL HIGH (ref 10.4–31.8)
TIBC: 385 ug/dL (ref 250–450)
UIBC: 165 ug/dL

## 2017-10-26 LAB — SAMPLE TO BLOOD BANK

## 2017-10-26 LAB — FERRITIN: Ferritin: 629 ng/mL — ABNORMAL HIGH (ref 11–307)

## 2017-10-27 ENCOUNTER — Inpatient Hospital Stay: Payer: Medicare Other | Admitting: Oncology

## 2017-10-27 ENCOUNTER — Other Ambulatory Visit: Payer: Medicare Other

## 2017-10-27 ENCOUNTER — Inpatient Hospital Stay: Payer: Medicare Other

## 2017-10-30 ENCOUNTER — Ambulatory Visit: Payer: Medicare Other

## 2017-11-16 ENCOUNTER — Other Ambulatory Visit: Payer: Self-pay | Admitting: *Deleted

## 2017-11-16 MED ORDER — HYDROCODONE-ACETAMINOPHEN 5-325 MG PO TABS: 1.0000 | ORAL_TABLET | ORAL | 0 refills | Status: DC | PRN

## 2017-11-20 NOTE — Progress Notes (Signed)
[place]  Telephone:(336) [phone] Fax:(336) [phone]  ID: Jacqueline Russo OB: 30-Oct-1929  MR#: [id number]  PFX#:[id number]  Patient Care Team: [name], MD as PCP - General (Internal Medicine)  CHIEF COMPLAINT: MDS 5q-, CLL  INTERVAL HISTORY: Patient returns to clinic today for further evaluation and blood transfusion.  She continues to have chronic weakness and fatigue that has been slightly worse over the past 2 to 3 weeks.  She also complains of chronic back pain today.  She otherwise feels well.  She has no neurologic complaints. She denies any recent fevers or illnesses. She denies any night sweats or weight loss. She has no chest pain or shortness of breath. She denies any nausea, vomiting, constipation, or diarrhea. She denies any melena or hematochezia. She has no urinary complaints.  Patient offers no further specific complaints today.  REVIEW OF SYSTEMS:   Review of Systems  Constitutional: Positive for malaise/fatigue. Negative for fever and weight loss.  Respiratory: Negative.  Negative for cough and shortness of breath.   Cardiovascular: Negative.  Negative for chest pain and leg swelling.  Gastrointestinal: Negative.  Negative for abdominal pain, blood in stool and melena.  Genitourinary: Negative.  Negative for dysuria.  Musculoskeletal: Positive for back pain and joint pain.  Skin: Negative.  Negative for rash.  Neurological: Positive for weakness. Negative for dizziness, sensory change, focal weakness and headaches.  Endo/Heme/Allergies: Does not bruise/bleed easily.  Psychiatric/Behavioral: Negative.  The patient is not nervous/anxious.     As per HPI. Otherwise, a complete review of systems is negative.  PAST MEDICAL HISTORY: Past Medical History:  Diagnosis Date  . Anemia    Myelodysplastic syndrome  . Arthritis   . Hypertension   . Leukemia, chronic lymphoid (HCC)    CLL  . Ovarian cancer ([place]) 1963    PAST SURGICAL HISTORY: Past  Surgical History:  Procedure Laterality Date  . ABDOMINAL HYSTERECTOMY    . APPENDECTOMY    . BREAST SURGERY     cyst removed from right breast  . EYE SURGERY Bilateral    Cataract Extractions  . IR FLUORO GUIDE CV LINE RIGHT  06/30/2017  . REVERSE SHOULDER ARTHROPLASTY Right 10/28/2014   Procedure: REVERSE SHOULDER ARTHROPLASTY;  Surgeon: Corky Mull, MD;  Location: [facility] ORS;  Service: Orthopedics;  Laterality: Right;    FAMILY HISTORY: Reviewed and unchanged. No reported history of malignancy or chronic disease.     ADVANCED DIRECTIVES:    HEALTH MAINTENANCE: Social History   Tobacco Use  . Smoking status: Former Smoker    Packs/day: 2.00    Types: Cigarettes  . Smokeless tobacco: Never Used  Substance Use Topics  . Alcohol use: Yes    Alcohol/week: 1.0 standard drinks    Types: 1 Glasses of wine per week    Comment: 1 glass of wine daily  . Drug use: No     Colonoscopy:  PAP:  Bone density:  Lipid panel:  No Known Allergies  Current Outpatient Medications  Medication Sig Dispense Refill  . amLODipine (NORVASC) 10 MG tablet     . aspirin 81 MG tablet Take 81 mg by mouth daily.    . cyanocobalamin (,VITAMIN B-12,) 1000 MCG/ML injection Inject into the muscle.    . diphenhydrAMINE (BENADRYL) 50 MG tablet Take 50 mg by mouth at bedtime as needed for itching.    . hydrochlorothiazide (HYDRODIURIL) 12.5 MG tablet Take 12.5 mg by mouth daily.     Marland Kitchen HYDROcodone-acetaminophen (NORCO/VICODIN) 5-325 MG tablet  Take 1-2 tablets by mouth every 4 (four) hours as needed for moderate pain or severe pain. 60 tablet 0  . lidocaine-prilocaine (EMLA) cream Apply 1 application topically as needed. Apply to port 1-2 hours prior to appointment. Cover with plastic wrap. 30 g 0  . olmesartan (BENICAR) 20 MG tablet Take by mouth.    . SF 1.1 % GEL dental gel     . tiZANidine (ZANAFLEX) 2 MG tablet Take 1 tablet (2 mg total) by mouth at bedtime as needed for muscle spasms. 30 tablet 0  .  traMADol-acetaminophen (ULTRACET) 37.5-325 MG per tablet Take 1 tablet by mouth every 8 (eight) hours as needed.    . ALPRAZolam (XANAX) 0.25 MG tablet Take 1 tablet (0.25 mg total) by mouth daily. As needed. (Patient not taking: Reported on 09/01/2017) 30 tablet 0  . hydrALAZINE (APRESOLINE) 25 MG tablet Take by mouth.      No current facility-administered medications for this visit.    Facility-Administered Medications Ordered in Other Visits  Medication Dose Route Frequency Provider Last Rate Last Dose  . 0.9 %  sodium chloride infusion (Manually program via Guardrails IV Fluids)  250 mL Intravenous Once Lloyd Huger, MD        OBJECTIVE: Vitals:   11/24/17 0908  BP: (!) 202/66  Pulse: 83  Resp: 18  Temp: 98.5 F (36.9 C)     Body mass index is 21.29 kg/m.    ECOG FS:0 - Asymptomatic  General: Thin, no acute distress. Eyes: Pink conjunctiva, anicteric sclera. HEENT: Normocephalic, moist mucous membranes. Lungs: Clear to auscultation bilaterally. Heart: Regular rate and rhythm. No rubs, murmurs, or gallops. Abdomen: Soft, nontender, nondistended. No organomegaly noted, normoactive bowel sounds. Musculoskeletal: No edema, cyanosis, or clubbing. Neuro: Alert, answering all questions appropriately. Cranial nerves grossly intact. Skin: No rashes or petechiae noted. Psych: Normal affect.  LAB RESULTS:  Lab Results  Component Value Date   NA 130 (L) 03/16/2017   K 3.3 (L) 03/16/2017   CL 97 (L) 03/16/2017   CO2 22 03/16/2017   GLUCOSE 147 (H) 03/16/2017   BUN 19 03/16/2017   CREATININE 0.90 03/16/2017   CALCIUM 9.5 03/16/2017   PROT 7.3 03/27/2013   ALBUMIN 4.1 03/27/2013   AST 12 (L) 03/27/2013   ALT 20 03/27/2013   ALKPHOS 87 03/27/2013   BILITOT 0.5 03/27/2013   GFRNONAA 57 (L) 03/16/2017   GFRAA >60 03/16/2017    Lab Results  Component Value Date   WBC 4.1 11/23/2017   NEUTROABS 2.2 11/23/2017   HGB 7.5 (L) 11/23/2017   HCT 22.7 (L) 11/23/2017   MCV  121.0 (H) 11/23/2017   PLT 311 11/23/2017   Lab Results  Component Value Date   IRON 164 11/23/2017   TIBC 352 11/23/2017   IRONPCTSAT 47 (H) 11/23/2017   Lab Results  Component Value Date   FERRITIN 593 (H) 11/23/2017     STUDIES: No results found.  ASSESSMENT: MDS 5q-, CLL   PLAN:    1. MDS, 5q-: Bone marrow biopsy results from June 30, 2017 reviewed independently confirming progression of patient's MDS, 5q-. She has a hypercellular marrow for age as well as an increased blast count up to 6%.  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  Patient continues to decline treatment with Revlimid.  Previously, we also discussed the possibility of progressing to AML, but patient wishes no further interventions other than periodic blood transfusions.  Proceed with 1 unit packed red  blood cells today.  Patient will also require Desferal with all of her infusions.  Return to clinic in 1 month with repeat laboratory work and further evaluation.   2.  Anemia: Repeat bone marrow biopsy as above.  Secondary to MDS.  Transfusion as above.   3.  Elevated iron stores: Desferal as above.   4.  CLL: Bone marrow biopsy did not mention any involvement of CLL.  White count is within normal limits.  CT scan results from June 09, 2014 were independently reviewed and did not reveal any evidence of recurrent disease. All of her labwork is also either negative or within normal limits. Patient last received single agent Rituxan in February 2015.  5.  Anxiety: Continue Xanax as needed. 6.  Hypertension: Patient's blood pressure remains significantly elevated today.  Unclear her compliance with her medications.  Continue follow-up and treatment per primary care. 7.  Back pain: Patient reports that she is seeing a chiropractor in the next several weeks.   Patient expressed understanding and was in agreement with this plan. She also understands that She can call clinic at  any time with any questions, concerns, or complaints.   Lloyd Huger, MD   11/24/2017 11:51 AM

## 2017-11-21 ENCOUNTER — Other Ambulatory Visit: Payer: Self-pay | Admitting: *Deleted

## 2017-11-21 MED ORDER — HYDROCODONE-ACETAMINOPHEN 5-325 MG PO TABS: 1.0000 | ORAL_TABLET | ORAL | 0 refills | Status: DC | PRN

## 2017-11-21 NOTE — Telephone Encounter (Signed)
Norco prescription went to wrong pharmacy

## 2017-11-21 NOTE — Telephone Encounter (Signed)
[facility] 

## 2017-11-21 NOTE — Telephone Encounter (Signed)
[place], what pharmacy?

## 2017-11-23 ENCOUNTER — Inpatient Hospital Stay: Payer: Medicare Other | Attending: Oncology

## 2017-11-23 ENCOUNTER — Other Ambulatory Visit: Payer: Self-pay | Admitting: *Deleted

## 2017-11-23 ENCOUNTER — Telehealth: Payer: Self-pay | Admitting: *Deleted

## 2017-11-23 ENCOUNTER — Other Ambulatory Visit: Payer: Self-pay | Admitting: Oncology

## 2017-11-23 DIAGNOSIS — D46C Myelodysplastic syndrome with isolated del(5q) chromosomal abnormality: Secondary | ICD-10-CM | POA: Diagnosis not present

## 2017-11-23 DIAGNOSIS — C9110 Chronic lymphocytic leukemia of B-cell type not having achieved remission: Secondary | ICD-10-CM | POA: Diagnosis present

## 2017-11-23 DIAGNOSIS — Z8543 Personal history of malignant neoplasm of ovary: Secondary | ICD-10-CM | POA: Insufficient documentation

## 2017-11-23 DIAGNOSIS — I10 Essential (primary) hypertension: Secondary | ICD-10-CM | POA: Insufficient documentation

## 2017-11-23 DIAGNOSIS — G8929 Other chronic pain: Secondary | ICD-10-CM | POA: Insufficient documentation

## 2017-11-23 DIAGNOSIS — R5381 Other malaise: Secondary | ICD-10-CM | POA: Insufficient documentation

## 2017-11-23 DIAGNOSIS — R5382 Chronic fatigue, unspecified: Secondary | ICD-10-CM | POA: Diagnosis not present

## 2017-11-23 DIAGNOSIS — F418 Other specified anxiety disorders: Secondary | ICD-10-CM | POA: Insufficient documentation

## 2017-11-23 DIAGNOSIS — D638 Anemia in other chronic diseases classified elsewhere: Secondary | ICD-10-CM | POA: Diagnosis not present

## 2017-11-23 DIAGNOSIS — M1990 Unspecified osteoarthritis, unspecified site: Secondary | ICD-10-CM | POA: Diagnosis not present

## 2017-11-23 DIAGNOSIS — Z79899 Other long term (current) drug therapy: Secondary | ICD-10-CM | POA: Insufficient documentation

## 2017-11-23 DIAGNOSIS — R531 Weakness: Secondary | ICD-10-CM | POA: Diagnosis not present

## 2017-11-23 DIAGNOSIS — M549 Dorsalgia, unspecified: Secondary | ICD-10-CM | POA: Insufficient documentation

## 2017-11-23 DIAGNOSIS — Z7982 Long term (current) use of aspirin: Secondary | ICD-10-CM | POA: Insufficient documentation

## 2017-11-23 DIAGNOSIS — Z87891 Personal history of nicotine dependence: Secondary | ICD-10-CM | POA: Diagnosis not present

## 2017-11-23 LAB — CBC WITH DIFFERENTIAL/PLATELET
Basophils Absolute: 0.1 10*3/uL (ref 0–0.1)
Basophils Relative: 4 %
Eosinophils Absolute: 0.2 10*3/uL (ref 0–0.7)
Eosinophils Relative: 6 %
HCT: 22.7 % — ABNORMAL LOW (ref 35.0–47.0)
Hemoglobin: 7.5 g/dL — ABNORMAL LOW (ref 12.0–16.0)
Lymphocytes Relative: 27 %
Lymphs Abs: 1.1 10*3/uL (ref 1.0–3.6)
MCH: 40.2 pg — ABNORMAL HIGH (ref 26.0–34.0)
MCHC: 33.2 g/dL (ref 32.0–36.0)
MCV: 121 fL — ABNORMAL HIGH (ref 80.0–100.0)
Monocytes Absolute: 0.5 10*3/uL (ref 0.2–0.9)
Monocytes Relative: 12 %
Neutro Abs: 2.2 10*3/uL (ref 1.4–6.5)
Neutrophils Relative %: 51 %
Platelets: 311 10*3/uL (ref 150–440)
RBC: 1.87 MIL/uL — ABNORMAL LOW (ref 3.80–5.20)
RDW: 19.3 % — ABNORMAL HIGH (ref 11.5–14.5)
WBC: 4.1 10*3/uL (ref 3.6–11.0)

## 2017-11-23 LAB — IRON AND TIBC
Iron: 164 ug/dL (ref 28–170)
Saturation Ratios: 47 % — ABNORMAL HIGH (ref 10.4–31.8)
TIBC: 352 ug/dL (ref 250–450)
UIBC: 188 ug/dL

## 2017-11-23 LAB — SAMPLE TO BLOOD BANK

## 2017-11-23 LAB — PREPARE RBC (CROSSMATCH)

## 2017-11-23 LAB — FERRITIN: Ferritin: 593 ng/mL — ABNORMAL HIGH (ref 11–307)

## 2017-11-23 NOTE — Telephone Encounter (Signed)
Patient called to see if she needed that medication prior tor eceiving blood tomm since she has not had a transfusion in a couple of months. I spoke to Dr Gary Fleet nurse who stated the patient does need desferal due to her last iron saturation laevels. Informed patient of above.

## 2017-11-24 ENCOUNTER — Inpatient Hospital Stay (HOSPITAL_BASED_OUTPATIENT_CLINIC_OR_DEPARTMENT_OTHER): Payer: Medicare Other | Admitting: Oncology

## 2017-11-24 ENCOUNTER — Encounter: Payer: Self-pay | Admitting: Oncology

## 2017-11-24 ENCOUNTER — Inpatient Hospital Stay: Payer: Medicare Other

## 2017-11-24 VITALS — BP 202/66 | HR 83 | Temp 98.5°F | Resp 18 | Wt 109.0 lb

## 2017-11-24 VITALS — BP 212/73 | HR 97 | Temp 97.2°F | Resp 20

## 2017-11-24 DIAGNOSIS — F418 Other specified anxiety disorders: Secondary | ICD-10-CM

## 2017-11-24 DIAGNOSIS — D638 Anemia in other chronic diseases classified elsewhere: Secondary | ICD-10-CM

## 2017-11-24 DIAGNOSIS — M549 Dorsalgia, unspecified: Secondary | ICD-10-CM

## 2017-11-24 DIAGNOSIS — I10 Essential (primary) hypertension: Secondary | ICD-10-CM

## 2017-11-24 DIAGNOSIS — M1990 Unspecified osteoarthritis, unspecified site: Secondary | ICD-10-CM

## 2017-11-24 DIAGNOSIS — C9110 Chronic lymphocytic leukemia of B-cell type not having achieved remission: Secondary | ICD-10-CM | POA: Diagnosis not present

## 2017-11-24 DIAGNOSIS — R531 Weakness: Secondary | ICD-10-CM

## 2017-11-24 DIAGNOSIS — D46C Myelodysplastic syndrome with isolated del(5q) chromosomal abnormality: Secondary | ICD-10-CM

## 2017-11-24 DIAGNOSIS — Z79899 Other long term (current) drug therapy: Secondary | ICD-10-CM

## 2017-11-24 DIAGNOSIS — G8929 Other chronic pain: Secondary | ICD-10-CM

## 2017-11-24 DIAGNOSIS — R5381 Other malaise: Secondary | ICD-10-CM

## 2017-11-24 DIAGNOSIS — D649 Anemia, unspecified: Secondary | ICD-10-CM

## 2017-11-24 DIAGNOSIS — R5382 Chronic fatigue, unspecified: Secondary | ICD-10-CM

## 2017-11-24 DIAGNOSIS — Z8543 Personal history of malignant neoplasm of ovary: Secondary | ICD-10-CM

## 2017-11-24 DIAGNOSIS — Z87891 Personal history of nicotine dependence: Secondary | ICD-10-CM

## 2017-11-24 MED ORDER — DIPHENHYDRAMINE HCL 50 MG/ML IJ SOLN
25.0000 mg | Freq: Once | INTRAMUSCULAR | Status: AC
  Administered 2017-11-24: 25 mg via INTRAVENOUS
  Filled 2017-11-24: qty 1

## 2017-11-24 MED ORDER — ACETAMINOPHEN 325 MG PO TABS
650.0000 mg | ORAL_TABLET | Freq: Once | ORAL | Status: AC
  Administered 2017-11-24: 650 mg via ORAL
  Filled 2017-11-24: qty 2

## 2017-11-24 MED ORDER — SODIUM CHLORIDE 0.9% FLUSH
10.0000 mL | INTRAVENOUS | Status: DC | PRN
  Administered 2017-11-24: 10 mL via INTRAVENOUS
  Filled 2017-11-24: qty 10

## 2017-11-24 MED ORDER — SODIUM CHLORIDE 0.9 % IV SOLN: 15.0000 mg/kg/h | Freq: Once | INTRAVENOUS | Status: DC

## 2017-11-24 MED ORDER — SODIUM CHLORIDE 0.9 % IV SOLN
Freq: Once | INTRAVENOUS | Status: AC
  Administered 2017-11-24: 10:00:00 via INTRAVENOUS
  Filled 2017-11-24: qty 250

## 2017-11-24 MED ORDER — AMLODIPINE BESYLATE 5 MG PO TABS
5.0000 mg | ORAL_TABLET | Freq: Once | ORAL | Status: AC
  Administered 2017-11-24: 5 mg via ORAL

## 2017-11-24 MED ORDER — SODIUM CHLORIDE 0.9% IV SOLUTION
250.0000 mL | Freq: Once | INTRAVENOUS | Status: AC
  Administered 2017-11-24: 250 mL via INTRAVENOUS
  Filled 2017-11-24: qty 250

## 2017-11-24 MED ORDER — SODIUM CHLORIDE 0.9 % IV SOLN
15.0000 mg/kg/h | Freq: Once | INTRAVENOUS | Status: AC
  Administered 2017-11-24: 15 mg/kg/h via INTRAVENOUS
  Filled 2017-11-24: qty 2

## 2017-11-24 MED ORDER — HEPARIN SOD (PORK) LOCK FLUSH 100 UNIT/ML IV SOLN
500.0000 [IU] | Freq: Once | INTRAVENOUS | Status: AC
  Administered 2017-11-24: 500 [IU] via INTRAVENOUS

## 2017-11-24 NOTE — Patient Instructions (Signed)

## 2017-11-24 NOTE — Progress Notes (Signed)
Patient denies any concerns today.  

## 2017-11-24 NOTE — Progress Notes (Signed)
Patient highly anxious this morning while waiting to be seen by the MD.  MD notified and patient brought to the infusion suite to start infusion of Desferyl.  Blood pressure elevated today with highest reading of 220/73.  Consulted with MD and Norvasc ordered and given to patient.  Blood pressure now down to 203/73.  Will continue to monitor during infusion.  Patient does not express any side effects or complaints from elevated pressure.

## 2017-11-25 LAB — TYPE AND SCREEN
ABO/RH(D): AB POS
Antibody Screen: NEGATIVE
Unit division: 0

## 2017-11-25 LAB — BPAM RBC
Blood Product Expiration Date: 201910042359
ISSUE DATE / TIME: 201909061245
Unit Type and Rh: 6200

## 2017-11-28 ENCOUNTER — Encounter (INDEPENDENT_AMBULATORY_CARE_PROVIDER_SITE_OTHER): Payer: Self-pay | Admitting: Nurse Practitioner

## 2017-11-28 ENCOUNTER — Ambulatory Visit (INDEPENDENT_AMBULATORY_CARE_PROVIDER_SITE_OTHER): Payer: Medicare Other

## 2017-11-28 ENCOUNTER — Ambulatory Visit (INDEPENDENT_AMBULATORY_CARE_PROVIDER_SITE_OTHER): Payer: Medicare Other | Admitting: Nurse Practitioner

## 2017-11-28 VITALS — BP 197/88 | HR 84 | Resp 16 | Ht 60.0 in | Wt 107.8 lb

## 2017-11-28 DIAGNOSIS — E119 Type 2 diabetes mellitus without complications: Secondary | ICD-10-CM | POA: Diagnosis not present

## 2017-11-28 DIAGNOSIS — I10 Essential (primary) hypertension: Secondary | ICD-10-CM

## 2017-11-28 DIAGNOSIS — I6523 Occlusion and stenosis of bilateral carotid arteries: Secondary | ICD-10-CM | POA: Diagnosis not present

## 2017-11-28 DIAGNOSIS — E785 Hyperlipidemia, unspecified: Secondary | ICD-10-CM | POA: Diagnosis not present

## 2017-11-28 NOTE — Progress Notes (Signed)
Subjective:    Patient ID: Jacqueline Russo Born, female    DOB: [birth year], 84 y.o.   MRN: [id number] No chief complaint on file.   HPI  Jacqueline Russo is a 84 y.o. female who presents for follow up evaluation of carotid stenosis. The carotid stenosis followed by ultrasound.   The patient denies amaurosis fugax. There is no recent history of TIA symptoms or focal motor deficits. There is no prior documented CVA.  The patient is taking enteric-coated aspirin 81 mg daily.  There is no history of migraine headaches. There is no history of seizures.  The patient has a history of coronary artery disease, no recent episodes of angina or shortness of breath. The patient denies PAD or claudication symptoms. There is a history of hyperlipidemia which is being treated with a statin.    Carotid Duplex done today shows 40 to 59% stenosis on the bilateral internal carotid arteries.  No change compared to last study in 02/17/2017.   Constitutional: [] Weight loss  [] Fever  [] Chills Cardiac: [] Chest pain   [] Chest pressure   [] Palpitations   [] Shortness of breath when laying flat   [] Shortness of breath with exertion. Vascular:  [] Pain in legs with walking   [] Pain in legs with standing  [] History of DVT   [] Phlebitis   [] Swelling in legs   [] Varicose veins   [] Non-healing ulcers Pulmonary:   [] Uses home oxygen   [] Productive cough   [] Hemoptysis   [] Wheeze  [] COPD   [] Asthma Neurologic:  [] Dizziness   [] Seizures   [] History of stroke   [] History of TIA  [] Aphasia   [] Vissual changes   [] Weakness or numbness in arm   [] Weakness or numbness in leg Musculoskeletal:   [] Joint swelling   [] Joint pain   [] Low back pain Hematologic:  [] Easy bruising  [] Easy bleeding   [] Hypercoagulable state   [] Anemic Gastrointestinal:  [] Diarrhea   [] Vomiting  [] Gastroesophageal reflux/heartburn   [] Difficulty swallowing. Genitourinary:  [] Chronic kidney disease   [] Difficult urination  [] Frequent urination   [] Blood in  urine Skin:  [] Rashes   [] Ulcers  Psychological:  [] History of anxiety   []  History of major depression.     Objective:   Physical Exam  BP (!) 197/88 (BP Location: Left Arm)   Pulse 84   Resp 16   Ht 5' (1.524 m)   Wt 107 lb 12.8 oz (48.9 kg)   BMI 21.05 kg/m    Past Medical History:  Diagnosis Date  . Anemia    Myelodysplastic syndrome  . Arthritis   . Hypertension   . Leukemia, chronic lymphoid (HCC)    CLL  . Ovarian cancer ([place]) 1963     Gen: WD/WN, NAD Head: [place]/AT, No temporalis wasting.  Ear/Nose/Throat: Hearing grossly intact, nares w/o erythema or drainage Eyes: PER, EOMI, sclera nonicteric.  Neck: Supple, no masses.  No JVD.  Pulmonary:  Good air movement, no use of accessory muscles.  Cardiac: RRR, right Port-A-Cath Vascular:  No bruits appreciated Vessel Right Left  Radial  palpable  palpable  Gastrointestinal: soft, non-distended. No guarding/no peritoneal signs.  Musculoskeletal: M/S 5/5 throughout.  No deformity or atrophy.  Neurologic: Pain and light touch intact in extremities.  Symmetrical.  Speech is fluent. Motor exam as listed above. Psychiatric: Judgment intact, Mood & affect appropriate for pt's clinical situation. Dermatologic: No Venous rashes. No Ulcers Noted.  No changes consistent with cellulitis. Lymph : No Cervical lymphadenopathy, no lichenification or skin changes of chronic lymphedema.   Social History  Socioeconomic History  . Marital status: Widowed    Spouse name: Not on file  . Number of children: Not on file  . Years of education: Not on file  . Highest education level: Not on file  Occupational History  . Not on file  Social Needs  . Financial resource strain: Not on file  . Food insecurity:    Worry: Not on file    Inability: Not on file  . Transportation needs:    Medical: Not on file    Non-medical: Not on file  Tobacco Use  . Smoking status: Former Smoker    Packs/day: 2.00    Types: Cigarettes  .  Smokeless tobacco: Never Used  Substance and Sexual Activity  . Alcohol use: Yes    Alcohol/week: 1.0 standard drinks    Types: 1 Glasses of wine per week    Comment: 1 glass of wine daily  . Drug use: No  . Sexual activity: Not on file  Lifestyle  . Physical activity:    Days per week: Not on file    Minutes per session: Not on file  . Stress: Not on file  Relationships  . Social connections:    Talks on phone: Not on file    Gets together: Not on file    Attends religious service: Not on file    Active member of club or organization: Not on file    Attends meetings of clubs or organizations: Not on file    Relationship status: Not on file  . Intimate partner violence:    Fear of current or ex partner: Not on file    Emotionally abused: Not on file    Physically abused: Not on file    Forced sexual activity: Not on file  Other Topics Concern  . Not on file  Social History Narrative  . Not on file    Past Surgical History:  Procedure Laterality Date  . ABDOMINAL HYSTERECTOMY    . APPENDECTOMY    . BREAST SURGERY     cyst removed from right breast  . EYE SURGERY Bilateral    Cataract Extractions  . IR FLUORO GUIDE CV LINE RIGHT  06/30/2017  . REVERSE SHOULDER ARTHROPLASTY Right 10/28/2014   Procedure: REVERSE SHOULDER ARTHROPLASTY;  Surgeon: Corky Mull, MD;  Location: [facility] ORS;  Service: Orthopedics;  Laterality: Right;    No family history on file.  No Known Allergies     Assessment & Plan:   1. Bilateral carotid artery stenosis Recommend:  Given the patient's asymptomatic subcritical stenosis no further invasive testing or surgery at this time.  Duplex ultrasound shows 40-59% stenosis bilaterally.  Continue antiplatelet therapy as prescribed Continue management of CAD, HTN and Hyperlipidemia Healthy heart diet,  encouraged exercise at least 4 times per week Follow up in 6 months with duplex ultrasound and physical exam   2. Essential hypertension The  patient's blood pressure was elevated on this visit.  According to the patient it has been elevated during other doctor visits.  Patient states her primary care physician has changed around several medicines urged her to follow-up with her primary care physician for management of her hypertension.  3. Diet-controlled type 2 diabetes mellitus ([place]) Continue diet interventions as already ordered, these medications have been reviewed and there are no changes at this time.  Hgb A1C to be monitored as already arranged by primary service   4. Hyperlipidemia, unspecified hyperlipidemia type Continue statin as ordered and reviewed, no changes at  this time    Current Outpatient Medications on File Prior to Visit  Medication Sig Dispense Refill  . ALPRAZolam (XANAX) 0.25 MG tablet Take 1 tablet (0.25 mg total) by mouth daily. As needed. (Patient not taking: Reported on 09/01/2017) 30 tablet 0  . amLODipine (NORVASC) 10 MG tablet     . aspirin 81 MG tablet Take 81 mg by mouth daily.    . cyanocobalamin (,VITAMIN B-12,) 1000 MCG/ML injection Inject into the muscle.    . diphenhydrAMINE (BENADRYL) 50 MG tablet Take 50 mg by mouth at bedtime as needed for itching.    . hydrALAZINE (APRESOLINE) 25 MG tablet Take by mouth.     . hydrochlorothiazide (HYDRODIURIL) 12.5 MG tablet Take 12.5 mg by mouth daily.     Marland Kitchen HYDROcodone-acetaminophen (NORCO/VICODIN) 5-325 MG tablet Take 1-2 tablets by mouth every 4 (four) hours as needed for moderate pain or severe pain. 60 tablet 0  . lidocaine-prilocaine (EMLA) cream Apply 1 application topically as needed. Apply to port 1-2 hours prior to appointment. Cover with plastic wrap. 30 g 0  . olmesartan (BENICAR) 20 MG tablet Take by mouth.    . SF 1.1 % GEL dental gel     . tiZANidine (ZANAFLEX) 2 MG tablet Take 1 tablet (2 mg total) by mouth at bedtime as needed for muscle spasms. 30 tablet 0  . traMADol-acetaminophen (ULTRACET) 37.5-325 MG per tablet Take 1 tablet by  mouth every 8 (eight) hours as needed.     No current facility-administered medications on file prior to visit.     There are no Patient Instructions on file for this visit. No follow-ups on file.   Kris Hartmann, NP

## 2017-11-29 ENCOUNTER — Other Ambulatory Visit: Payer: Self-pay | Admitting: *Deleted

## 2017-11-29 MED ORDER — ALPRAZOLAM 0.25 MG PO TABS: 0.2500 mg | ORAL_TABLET | Freq: Every day | ORAL | 0 refills | Status: DC

## 2017-12-18 NOTE — Progress Notes (Deleted)
[facility]  [phone] [phone]  ID: Chaney Born OB: August 29, 1929  MR#: [id number]  ZWC#:[id number]  Patient Care Team: [name], MD as PCP - General (Internal Medicine)  CHIEF COMPLAINT: MDS 5q-, CLL  INTERVAL HISTORY: Patient returns to clinic today for further evaluation and blood transfusion.  She continues to have chronic weakness and fatigue that has been slightly worse over the past 2 to 3 weeks.  She also complains of chronic back pain today.  She otherwise feels well.  She has no neurologic complaints. She denies any recent fevers or illnesses. She denies any night sweats or weight loss. She has no chest pain or shortness of breath. She denies any nausea, vomiting, constipation, or diarrhea. She denies any melena or hematochezia. She has no urinary complaints.  Patient offers no further specific complaints today.  REVIEW OF SYSTEMS:   Review of Systems  Constitutional: Positive for malaise/fatigue. Negative for fever and weight loss.  Respiratory: Negative.  Negative for cough and shortness of breath.   Cardiovascular: Negative.  Negative for chest pain and leg swelling.  Gastrointestinal: Negative.  Negative for abdominal pain, blood in stool and melena.  Genitourinary: Negative.  Negative for dysuria.  Musculoskeletal: Positive for back pain and joint pain.  Skin: Negative.  Negative for rash.  Neurological: Positive for weakness. Negative for dizziness, sensory change, focal weakness and headaches.  Endo/Heme/Allergies: Does not bruise/bleed easily.  Psychiatric/Behavioral: Negative.  The patient is not nervous/anxious.     As per HPI. Otherwise, a complete review of systems is negative.  PAST MEDICAL HISTORY: Past Medical History:  Diagnosis Date  . Anemia    Myelodysplastic syndrome  . Arthritis   . Hypertension   . Leukemia, chronic lymphoid (HCC)    CLL  . Ovarian cancer ([place]) 1963    PAST SURGICAL HISTORY: Past  Surgical History:  Procedure Laterality Date  . ABDOMINAL HYSTERECTOMY    . APPENDECTOMY    . BREAST SURGERY     cyst removed from right breast  . EYE SURGERY Bilateral    Cataract Extractions  . IR FLUORO GUIDE CV LINE RIGHT  06/30/2017  . REVERSE SHOULDER ARTHROPLASTY Right 10/28/2014   Procedure: REVERSE SHOULDER ARTHROPLASTY;  Surgeon: Corky Mull, MD;  Location: [facility] ORS;  Service: Orthopedics;  Laterality: Right;    FAMILY HISTORY: Reviewed and unchanged. No reported history of malignancy or chronic disease.     ADVANCED DIRECTIVES:    HEALTH MAINTENANCE: Social History   Tobacco Use  . Smoking status: Former Smoker    Packs/day: 2.00    Types: Cigarettes  . Smokeless tobacco: Never Used  Substance Use Topics  . Alcohol use: Yes    Alcohol/week: 1.0 standard drinks    Types: 1 Glasses of wine per week    Comment: 1 glass of wine daily  . Drug use: No     Colonoscopy:  PAP:  Bone density:  Lipid panel:  No Known Allergies  Current Outpatient Medications  Medication Sig Dispense Refill  . ALPRAZolam (XANAX) 0.25 MG tablet Take 1 tablet (0.25 mg total) by mouth daily. As needed. 30 tablet 0  . amLODipine (NORVASC) 10 MG tablet     . aspirin 81 MG tablet Take 81 mg by mouth daily.    . cyanocobalamin (,VITAMIN B-12,) 1000 MCG/ML injection Inject into the muscle.    . diphenhydrAMINE (BENADRYL) 50 MG tablet Take 50 mg by mouth at bedtime as needed for itching.    Marland Kitchen  hydrALAZINE (APRESOLINE) 25 MG tablet Take by mouth.     . hydrochlorothiazide (HYDRODIURIL) 12.5 MG tablet Take 12.5 mg by mouth daily.     Marland Kitchen HYDROcodone-acetaminophen (NORCO/VICODIN) 5-325 MG tablet Take 1-2 tablets by mouth every 4 (four) hours as needed for moderate pain or severe pain. 60 tablet 0  . lidocaine-prilocaine (EMLA) cream Apply 1 application topically as needed. Apply to port 1-2 hours prior to appointment. Cover with plastic wrap. 30 g 0  . olmesartan (BENICAR) 20 MG tablet Take by  mouth.    . SF 1.1 % GEL dental gel     . tiZANidine (ZANAFLEX) 2 MG tablet Take 1 tablet (2 mg total) by mouth at bedtime as needed for muscle spasms. 30 tablet 0  . traMADol-acetaminophen (ULTRACET) 37.5-325 MG per tablet Take 1 tablet by mouth every 8 (eight) hours as needed.     No current facility-administered medications for this visit.     OBJECTIVE: There were no vitals filed for this visit.   There is no height or weight on file to calculate BMI.    ECOG FS:0 - Asymptomatic  General: Thin, no acute distress. Eyes: Pink conjunctiva, anicteric sclera. HEENT: Normocephalic, moist mucous membranes. Lungs: Clear to auscultation bilaterally. Heart: Regular rate and rhythm. No rubs, murmurs, or gallops. Abdomen: Soft, nontender, nondistended. No organomegaly noted, normoactive bowel sounds. Musculoskeletal: No edema, cyanosis, or clubbing. Neuro: Alert, answering all questions appropriately. Cranial nerves grossly intact. Skin: No rashes or petechiae noted. Psych: Normal affect.  LAB RESULTS:  Lab Results  Component Value Date   NA 130 (L) 03/16/2017   K 3.3 (L) 03/16/2017   CL 97 (L) 03/16/2017   CO2 22 03/16/2017   GLUCOSE 147 (H) 03/16/2017   BUN 19 03/16/2017   CREATININE 0.90 03/16/2017   CALCIUM 9.5 03/16/2017   PROT 7.3 03/27/2013   ALBUMIN 4.1 03/27/2013   AST 12 (L) 03/27/2013   ALT 20 03/27/2013   ALKPHOS 87 03/27/2013   BILITOT 0.5 03/27/2013   GFRNONAA 57 (L) 03/16/2017   GFRAA >60 03/16/2017    Lab Results  Component Value Date   WBC 4.1 11/23/2017   NEUTROABS 2.2 11/23/2017   HGB 7.5 (L) 11/23/2017   HCT 22.7 (L) 11/23/2017   MCV 121.0 (H) 11/23/2017   PLT 311 11/23/2017   Lab Results  Component Value Date   IRON 164 11/23/2017   TIBC 352 11/23/2017   IRONPCTSAT 47 (H) 11/23/2017   Lab Results  Component Value Date   FERRITIN 593 (H) 11/23/2017     STUDIES: No results found.  ASSESSMENT: MDS 5q-, CLL   PLAN:    1. MDS, 5q-: Bone  marrow biopsy results from June 30, 2017 reviewed independently confirming progression of patient's MDS, 5q-. She has a hypercellular marrow for age as well as an increased blast count up to 6%.  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  Patient continues to decline treatment with Revlimid.  Previously, we also discussed the possibility of progressing to AML, but patient wishes no further interventions other than periodic blood transfusions.  Proceed with 1 unit packed red blood cells today.  Patient will also require Desferal with all of her infusions.  Return to clinic in 1 month with repeat laboratory work and further evaluation.   2.  Anemia: Repeat bone marrow biopsy as above.  Secondary to MDS.  Transfusion as above.   3.  Elevated iron stores: Desferal as above.   4.  CLL: Bone marrow biopsy did not mention any involvement of CLL.  White count is within normal limits.  CT scan results from June 09, 2014 were independently reviewed and did not reveal any evidence of recurrent disease. All of her labwork is also either negative or within normal limits. Patient last received single agent Rituxan in February 2015.  5.  Anxiety: Continue Xanax as needed. 6.  Hypertension: Patient's blood pressure remains significantly elevated today.  Unclear her compliance with her medications.  Continue follow-up and treatment per primary care. 7.  Back pain: Patient reports that she is seeing a chiropractor in the next several weeks.   Patient expressed understanding and was in agreement with this plan. She also understands that She can call clinic at any time with any questions, concerns, or complaints.   Lloyd Huger, MD   12/18/2017 10:58 PM

## 2017-12-21 ENCOUNTER — Inpatient Hospital Stay: Payer: Medicare Other | Attending: Oncology

## 2017-12-21 DIAGNOSIS — D638 Anemia in other chronic diseases classified elsewhere: Secondary | ICD-10-CM | POA: Insufficient documentation

## 2017-12-21 DIAGNOSIS — D46C Myelodysplastic syndrome with isolated del(5q) chromosomal abnormality: Secondary | ICD-10-CM

## 2017-12-21 DIAGNOSIS — C9110 Chronic lymphocytic leukemia of B-cell type not having achieved remission: Secondary | ICD-10-CM | POA: Insufficient documentation

## 2017-12-21 LAB — CBC WITH DIFFERENTIAL/PLATELET
Basophils Absolute: 0.1 10*3/uL (ref 0–0.1)
Basophils Relative: 4 %
Eosinophils Absolute: 0.1 10*3/uL (ref 0–0.7)
Eosinophils Relative: 4 %
HCT: 26.3 % — ABNORMAL LOW (ref 35.0–47.0)
Hemoglobin: 8.9 g/dL — ABNORMAL LOW (ref 12.0–16.0)
Lymphocytes Relative: 27 %
Lymphs Abs: 0.9 10*3/uL — ABNORMAL LOW (ref 1.0–3.6)
MCH: 38.1 pg — ABNORMAL HIGH (ref 26.0–34.0)
MCHC: 33.9 g/dL (ref 32.0–36.0)
MCV: 112.4 fL — ABNORMAL HIGH (ref 80.0–100.0)
Monocytes Absolute: 0.4 10*3/uL (ref 0.2–0.9)
Monocytes Relative: 11 %
Neutro Abs: 1.8 10*3/uL (ref 1.4–6.5)
Neutrophils Relative %: 54 %
Platelets: 275 10*3/uL (ref 150–440)
RBC: 2.34 MIL/uL — ABNORMAL LOW (ref 3.80–5.20)
RDW: 20.8 % — ABNORMAL HIGH (ref 11.5–14.5)
WBC: 3.3 10*3/uL — ABNORMAL LOW (ref 3.6–11.0)

## 2017-12-21 LAB — SAMPLE TO BLOOD BANK

## 2017-12-21 LAB — IRON AND TIBC
Iron: 193 ug/dL — ABNORMAL HIGH (ref 28–170)
Saturation Ratios: 54 % — ABNORMAL HIGH (ref 10.4–31.8)
TIBC: 361 ug/dL (ref 250–450)
UIBC: 168 ug/dL

## 2017-12-22 ENCOUNTER — Inpatient Hospital Stay: Payer: Medicare Other | Admitting: Oncology

## 2017-12-22 ENCOUNTER — Inpatient Hospital Stay: Payer: Medicare Other

## 2017-12-27 ENCOUNTER — Other Ambulatory Visit: Payer: Self-pay | Admitting: Internal Medicine

## 2017-12-27 ENCOUNTER — Ambulatory Visit
Admission: RE | Admit: 2017-12-27 | Discharge: 2017-12-27 | Disposition: A | Discharge status: Home / Self Care | Payer: Medicare Other | Source: Ambulatory Visit | Attending: Internal Medicine | Admitting: Internal Medicine

## 2017-12-27 DIAGNOSIS — J439 Emphysema, unspecified: Secondary | ICD-10-CM | POA: Insufficient documentation

## 2017-12-27 DIAGNOSIS — R140 Abdominal distension (gaseous): Secondary | ICD-10-CM | POA: Diagnosis not present

## 2017-12-27 DIAGNOSIS — N289 Disorder of kidney and ureter, unspecified: Secondary | ICD-10-CM | POA: Diagnosis not present

## 2017-12-27 DIAGNOSIS — R1084 Generalized abdominal pain: Secondary | ICD-10-CM | POA: Insufficient documentation

## 2017-12-27 DIAGNOSIS — N323 Diverticulum of bladder: Secondary | ICD-10-CM | POA: Insufficient documentation

## 2017-12-27 MED ORDER — IOHEXOL 300 MG/ML  SOLN
75.0000 mL | Freq: Once | INTRAMUSCULAR | Status: AC | PRN
  Administered 2017-12-27: 75 mL via INTRAVENOUS

## 2017-12-28 ENCOUNTER — Other Ambulatory Visit: Payer: Self-pay | Admitting: Internal Medicine

## 2017-12-28 DIAGNOSIS — N281 Cyst of kidney, acquired: Secondary | ICD-10-CM

## 2018-01-09 ENCOUNTER — Other Ambulatory Visit: Payer: Self-pay | Admitting: Oncology

## 2018-01-09 ENCOUNTER — Other Ambulatory Visit: Payer: Self-pay | Admitting: *Deleted

## 2018-01-09 MED ORDER — ALPRAZOLAM 0.25 MG PO TABS: 0.2500 mg | ORAL_TABLET | Freq: Every day | ORAL | 0 refills | Status: DC

## 2018-01-09 NOTE — Progress Notes (Signed)
Patient called cancer center requesting refill of Xanax 0.25 mg daily.    [place] Controlled Substance Reporting System reviewed and refill is appropriate on or after 12/31/17. Medication e-scribed to her pharmacy ([facility]) using [employer] 2-step verification process.    [place] reviewed:     Faythe Casa, NP 01/09/2018 2:18 PM [phone]

## 2018-01-15 NOTE — Progress Notes (Signed)
Jacqueline Russo  Telephone:([phone]) [phone] [phone]) [phone]  ID: Chaney Born OB: Sep 21, 1929  MR#: [id number]  LFY#:[id number]  Patient Care Team: [name], MD as PCP - General (Internal Medicine)  CHIEF COMPLAINT: MDS 5q-, CLL  INTERVAL HISTORY: Patient returns to clinic today for further evaluation and blood transfusion.  She continues to have chronic weakness and fatigue.  She recently had imaging completed for increased abdominal bloating and mild tenderness.  She otherwise has felt well. She has no neurologic complaints. She denies any recent fevers or illnesses. She denies any night sweats or weight loss. She has no chest pain or shortness of breath. She denies any nausea, vomiting, constipation, or diarrhea. She denies any melena or hematochezia. She has no urinary complaints.  Patient offers no further specific complaints today.    REVIEW OF SYSTEMS:   Review of Systems  Constitutional: Positive for malaise/fatigue. Negative for fever and weight loss.  Respiratory: Negative.  Negative for cough and shortness of breath.   Cardiovascular: Negative.  Negative for chest pain and leg swelling.  Gastrointestinal: Negative.  Negative for abdominal pain, blood in stool and melena.  Genitourinary: Negative.  Negative for dysuria.  Musculoskeletal: Positive for back pain and joint pain.  Skin: Negative.  Negative for rash.  Neurological: Positive for weakness. Negative for dizziness, sensory change, focal weakness and headaches.  Endo/Heme/Allergies: Does not bruise/bleed easily.  Psychiatric/Behavioral: Negative.  The patient is not nervous/anxious.     As per HPI. Otherwise, a complete review of systems is negative.  PAST MEDICAL HISTORY: Past Medical History:  Diagnosis Date  . Anemia    Myelodysplastic syndrome  . Arthritis   . Hypertension   . Leukemia, chronic lymphoid (HCC)    CLL  . Ovarian cancer ([place]) 1963    PAST SURGICAL HISTORY: Past Surgical  History:  Procedure Laterality Date  . ABDOMINAL HYSTERECTOMY    . APPENDECTOMY    . BREAST SURGERY     cyst removed from right breast  . EYE SURGERY Bilateral    Cataract Extractions  . IR FLUORO GUIDE CV LINE RIGHT  06/30/2017  . REVERSE SHOULDER ARTHROPLASTY Right 10/28/2014   Procedure: REVERSE SHOULDER ARTHROPLASTY;  Surgeon: Corky Mull, MD;  Location: [facility] ORS;  Service: Orthopedics;  Laterality: Right;    FAMILY HISTORY: Reviewed and unchanged. No reported history of malignancy or chronic disease.     ADVANCED DIRECTIVES:    HEALTH MAINTENANCE: Social History   Tobacco Use  . Smoking status: Former Smoker    Packs/day: 2.00    Types: Cigarettes  . Smokeless tobacco: Never Used  Substance Use Topics  . Alcohol use: Yes    Alcohol/week: 1.0 standard drinks    Types: 1 Glasses of wine per week    Comment: 1 glass of wine daily  . Drug use: No     Colonoscopy:  PAP:  Bone density:  Lipid panel:  No Known Allergies  Current Outpatient Medications  Medication Sig Dispense Refill  . ALPRAZolam (XANAX) 0.25 MG tablet Take 1 tablet (0.25 mg total) by mouth daily. As needed. 30 tablet 0  . amLODipine (NORVASC) 10 MG tablet     . aspirin 81 MG tablet Take 81 mg by mouth daily.    . cyanocobalamin (,VITAMIN B-12,) 1000 MCG/ML injection Inject into the muscle.    . diphenhydrAMINE (BENADRYL) 50 MG tablet Take 50 mg by mouth at bedtime as needed for itching.    . hydrochlorothiazide (HYDRODIURIL) 12.5 MG tablet Take  12.5 mg by mouth daily.     Marland Kitchen HYDROcodone-acetaminophen (NORCO/VICODIN) 5-325 MG tablet Take 1-2 tablets by mouth every 4 (four) hours as needed for moderate pain or severe pain. 60 tablet 0  . lidocaine-prilocaine (EMLA) cream Apply 1 application topically as needed. Apply to port 1-2 hours prior to appointment. Cover with plastic wrap. 30 g 0  . olmesartan (BENICAR) 20 MG tablet Take by mouth.    . SF 1.1 % GEL dental gel     . tiZANidine (ZANAFLEX) 2 MG  tablet Take 1 tablet (2 mg total) by mouth at bedtime as needed for muscle spasms. 30 tablet 0  . traMADol-acetaminophen (ULTRACET) 37.5-325 MG per tablet Take 1 tablet by mouth every 8 (eight) hours as needed.    . hydrALAZINE (APRESOLINE) 25 MG tablet Take by mouth.      No current facility-administered medications for this visit.     OBJECTIVE: Vitals:   01/19/18 0917  BP: (!) 200/71  Pulse: 86  Resp: 20  Temp: 98.4 F (36.9 C)     Body mass index is 21.1 kg/m.    ECOG FS:0 - Asymptomatic  General: Thin, no acute distress. Eyes: Pink conjunctiva, anicteric sclera. HEENT: Normocephalic, moist mucous membranes. Lungs: Clear to auscultation bilaterally. Heart: Regular rate and rhythm. No rubs, murmurs, or gallops. Abdomen: Soft, nontender, nondistended. No organomegaly noted, normoactive bowel sounds. Musculoskeletal: No edema, cyanosis, or clubbing. Neuro: Alert, answering all questions appropriately. Cranial nerves grossly intact. Skin: No rashes or petechiae noted. Psych: Normal affect.  LAB RESULTS:  Lab Results  Component Value Date   NA 130 (L) 03/16/2017   K 3.3 (L) 03/16/2017   CL 97 (L) 03/16/2017   CO2 22 03/16/2017   GLUCOSE 147 (H) 03/16/2017   BUN 19 03/16/2017   CREATININE 0.90 03/16/2017   CALCIUM 9.5 03/16/2017   PROT 7.3 03/27/2013   ALBUMIN 4.1 03/27/2013   AST 12 (L) 03/27/2013   ALT 20 03/27/2013   ALKPHOS 87 03/27/2013   BILITOT 0.5 03/27/2013   GFRNONAA 57 (L) 03/16/2017   GFRAA >60 03/16/2017    Lab Results  Component Value Date   WBC 3.9 (L) 01/18/2018   NEUTROABS 2.3 01/18/2018   HGB 7.9 (L) 01/18/2018   HCT 22.7 (L) 01/18/2018   MCV 114.6 (H) 01/18/2018   PLT 308 01/18/2018   Lab Results  Component Value Date   IRON 162 01/18/2018   TIBC 339 01/18/2018   IRONPCTSAT 48 (H) 01/18/2018   Lab Results  Component Value Date   FERRITIN 887 (H) 01/18/2018     STUDIES: Mr Abdomen Wwo Contrast  Result Date:  01/16/2018 CLINICAL DATA:  Renal lesion on CT EXAM: MRI ABDOMEN WITHOUT AND WITH CONTRAST TECHNIQUE: Multiplanar multisequence MR imaging of the abdomen was performed both before and after the administration of intravenous contrast. CONTRAST:  5 mL Gadovist IV COMPARISON:  CT abdomen/pelvis dated 12/27/2017 FINDINGS: Motion degraded images. Lower chest: Lung bases are clear. Hepatobiliary: Scattered hepatic cysts, measuring up to 4.6 cm in segment 7 (series 6/image 4). Moderate hepatic steatosis. Gallbladder is unremarkable. No intrahepatic or extrahepatic ductal dilatation. Pancreas:  Within normal limits. Spleen:  Mild scarring. Adrenals/Urinary Tract:  Adrenal glands are within normal limits. Simple renal cysts, including a dominant 2.9 cm interpolar left renal cyst (series 6/image 12), benign (Bosniak I). Multiple hemorrhagic right renal cysts, including a 15 mm cyst in the posterior right upper kidney (series 11/image 45) and a 16 mm cyst in the anterior right lower  kidney (series 11/image 51), without enhancement following contrast administration, benign (Bosniak II). 1.7 x 1.7 x 1.7 cm cystic lesion with thickened, enhancing septations in the anterior right upper kidney (series 14/image 46), suspicious for cystic renal neoplasm (Bosniak IV). No hydronephrosis. Stomach/Bowel: Stomach is within normal limits. Visualized bowel is unremarkable. Vascular/Lymphatic: No evidence of abdominal aortic aneurysm. Single right renal artery and vein. No renal vein invasion. No suspicious abdominal lymphadenopathy. Other:  No abdominal ascites. Musculoskeletal: No suspicious osseous lesions. IMPRESSION: 1.7 cm cystic lesion with enhancing thickened septations in the anterior right upper kidney, suspicious for postictal neoplasm (Bosniak IV). Single right renal artery and vein. No renal vein invasion. No evidence of metastatic disease. Additional hemorrhagic right renal cysts measuring up to 1.6 cm, benign (Bosniak II).  Additional simple renal cysts measuring up to 2.9 cm in the interpolar left kidney, benign (Bosniak I). Electronically Signed   By: Julian Hy M.D.   On: 01/16/2018 12:28   Ct Abdomen Pelvis W Contrast  Result Date: 12/27/2017 CLINICAL DATA:  Abdominal distension and bloating for 2 weeks. Appendectomy. Ovarian cancer and leukemia. EXAM: CT ABDOMEN AND PELVIS WITH CONTRAST TECHNIQUE: Multidetector CT imaging of the abdomen and pelvis was performed using the standard protocol following bolus administration of intravenous contrast. CONTRAST:  29m OMNIPAQUE IOHEXOL 300 MG/ML  SOLN COMPARISON:  06/09/2014 and renal ultrasound of 09/27/2016. FINDINGS: Lower chest: Emphysema. Mild cardiomegaly with right coronary artery atherosclerosis. Hepatobiliary: Well-circumscribed low-density liver lesions are similar in size and distribution to on the prior, most consistent with cysts. Largest lesion is 4.1 cm in the posterior right hepatic lobe. Focus of hyperenhancement in the lateral segment left liver lobe on image 16/2 likely a perfusion anomaly or flash fill hemangioma, present back in 2016. Normal gallbladder, without biliary ductal dilatation. Pancreas: Normal, without mass or ductal dilatation. Spleen: Calcifications along the splenic capsule likely relate to prior trauma or infection. Adrenals/Urinary Tract: Normal adrenal glands. An upper pole right renal lesion measures greater than fluid density and 1.5 cm on image 31/2. This is enlarged from 7 mm back in 2016. Interpolar right renal lesion of 1.0 cm this too small to entirely characterize but also measures greater than fluid density on image 37/2. Enlarged since the prior. Lower pole right renal lesion measures 1.6 cm and significantly greater than fluid density on image 40/2. Significantly enlarged since the prior. Central inter/upper polar right renal lesion measures 1.2 cm and greater than fluid density on image 32/2. Heterogeneity at the upper pole  right kidney, including on the order of 1.4 cm on image 21/7. A left renal lesion measures 3.1 cm in the interpolar region and is similar in size to on the prior exam, most consistent with a cyst. Upper pole left renal lesion of 1.4 cm is fluid density, consistent with a cyst. No hydronephrosis. A small left-sided bladder diverticulum on image 59/2. Stomach/Bowel: Normal stomach, without wall thickening. Normal colon and terminal ileum. Normal small bowel. Vascular/Lymphatic: Aortic and branch vessel atherosclerosis. No abdominopelvic adenopathy. Reproductive: Hysterectomy.  No adnexal mass. Other: No significant free fluid. Mild to moderate pelvic floor laxity. Trace ventral abdominal wall laxity or tiny hernia at the level of the umbilicus. Musculoskeletal: Remote fractures of the right pubic rami. Degenerative changes of the left hip. Remote lower left rib fractures, including incompletely healed ninth posterolateral left fracture. Grade 1 L4-5 anterolisthesis. Prominent disc bulge at L3-4. Convex left lumbar spine curvature. IMPRESSION: 1. No acute process or explanation for abdominal distension/bloating. 2. Multiple indeterminate  right renal lesions. These could represent complex cysts or 1 or more solid neoplasms. Recommend further evaluation with dedicated pre and post contrast abdominal MRI. Dedicated renal protocol pre and post contrast abdominal CT could alternatively be performed but is less favored. 3. Aortic atherosclerosis (ICD10-I70.0), coronary artery atherosclerosis and emphysema (ICD10-J43.9). 4. Small left-sided bladder diverticulum. 5. No findings of metastatic disease or lymphoproliferative process. These results will be called to the ordering clinician or representative by the Radiologist Assistant, and communication documented in the PACS or zVision Dashboard. Electronically Signed   By: Abigail Miyamoto M.D.   On: 12/27/2017 13:19    ASSESSMENT: MDS 5q-, CLL   PLAN:    1. MDS, 5q-: Bone  marrow biopsy results from June 30, 2017 reviewed independently confirming progression of patient's MDS, 5q-. She has a hypercellular marrow for age as well as an increased blast count up to 6%.  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  Patient continues to decline treatment with Revlimid.  Previously, we also discussed the possibility of progressing to AML, but patient wishes no further interventions other than periodic blood transfusions.  Proceed with 1 unit of packed red blood cells today.  Patient also requires Desferal with every infusion.  Return to clinic in 1 month with repeat laboratory work, further evaluation, and consideration of additional blood.   2.  Anemia: Repeat bone marrow biopsy as above.  Secondary to MDS.  Transfusion as above.   3.  Elevated iron stores: Improving.  Desferal as above.   4.  CLL: Bone marrow biopsy did not mention any involvement of CLL.  White count is within normal limits.  CT scan results from June 09, 2014 were independently reviewed and did not reveal any evidence of recurrent disease. All of her labwork is also either negative or within normal limits. Patient last received single agent Rituxan in February 2015.  5.  Anxiety: Continue Xanax as needed. 6.  Hypertension: Patient's blood pressure remains significantly elevated today.  Unclear her compliance with her medications.  Will forward results to primary care for further evaluation. 7.  Back pain: Patient does not complain of this today. 8.  Abdominal bloating: MRI results from January 16, 2018 reviewed independently report as above.  No distinct etiology was noted, but incidentally a 1.7 cm cystic lesion suspicious for renal neoplasm was reported.  Continue follow-up with primary care.  Patient will likely need reimaging in 6 to 12 months to assess interval change.   Patient expressed understanding and was in agreement with this plan. She also understands that  She can call clinic at any time with any questions, concerns, or complaints.   Lloyd Huger, MD   01/20/2018 8:33 AM

## 2018-01-16 ENCOUNTER — Other Ambulatory Visit: Payer: Self-pay | Admitting: *Deleted

## 2018-01-16 ENCOUNTER — Ambulatory Visit
Admission: RE | Admit: 2018-01-16 | Discharge: 2018-01-16 | Disposition: A | Discharge status: Home / Self Care | Payer: Medicare Other | Source: Ambulatory Visit | Attending: Internal Medicine | Admitting: Internal Medicine

## 2018-01-16 DIAGNOSIS — N281 Cyst of kidney, acquired: Secondary | ICD-10-CM

## 2018-01-16 MED ORDER — HYDROCODONE-ACETAMINOPHEN 5-325 MG PO TABS: 1.0000 | ORAL_TABLET | ORAL | 0 refills | Status: DC | PRN

## 2018-01-16 MED ORDER — GADOBUTROL 1 MMOL/ML IV SOLN
7.5000 mL | Freq: Once | INTRAVENOUS | Status: AC | PRN
  Administered 2018-01-16: 5 mL via INTRAVENOUS

## 2018-01-16 NOTE — Telephone Encounter (Signed)
Patient called cancer center requesting refill of Oxycodone 5/325 tab.   As mandated by the [place] STOP Act (Strengthen Opioid Misuse Prevention), the [place] Controlled Substance Reporting System ([place]) was reviewed for this patient.  Below is the past 79-months of controlled substance prescriptions as displayed by the registry.  I have personally consulted with my supervising physician, Dr. Grayland Ormond, who agrees that continuation of opiate therapy is medically appropriate at this time and agrees to provide continual monitoring, including urine/blood drug screens, as indicated. Refill is appropriate on or after 11/29/17.  NCCSRS reviewed:    Faythe Casa, NP 01/16/2018 3:57 PM [phone]

## 2018-01-18 ENCOUNTER — Other Ambulatory Visit: Payer: Self-pay | Admitting: Oncology

## 2018-01-18 ENCOUNTER — Inpatient Hospital Stay: Payer: Medicare Other

## 2018-01-18 ENCOUNTER — Other Ambulatory Visit: Payer: Self-pay | Admitting: *Deleted

## 2018-01-18 DIAGNOSIS — D649 Anemia, unspecified: Secondary | ICD-10-CM

## 2018-01-18 DIAGNOSIS — C9110 Chronic lymphocytic leukemia of B-cell type not having achieved remission: Secondary | ICD-10-CM | POA: Diagnosis not present

## 2018-01-18 DIAGNOSIS — D469 Myelodysplastic syndrome, unspecified: Secondary | ICD-10-CM

## 2018-01-18 DIAGNOSIS — D46C Myelodysplastic syndrome with isolated del(5q) chromosomal abnormality: Secondary | ICD-10-CM

## 2018-01-18 LAB — CBC WITH DIFFERENTIAL/PLATELET
Abs Immature Granulocytes: 0.04 10*3/uL (ref 0.00–0.07)
Basophils Absolute: 0.1 10*3/uL (ref 0.0–0.1)
Basophils Relative: 2 %
Eosinophils Absolute: 0.2 10*3/uL (ref 0.0–0.5)
Eosinophils Relative: 4 %
HCT: 22.7 % — ABNORMAL LOW (ref 36.0–46.0)
Hemoglobin: 7.9 g/dL — ABNORMAL LOW (ref 12.0–15.0)
Immature Granulocytes: 1 %
Lymphocytes Relative: 22 %
Lymphs Abs: 0.9 10*3/uL (ref 0.7–4.0)
MCH: 39.9 pg — ABNORMAL HIGH (ref 26.0–34.0)
MCHC: 34.8 g/dL (ref 30.0–36.0)
MCV: 114.6 fL — ABNORMAL HIGH (ref 80.0–100.0)
Monocytes Absolute: 0.5 10*3/uL (ref 0.1–1.0)
Monocytes Relative: 13 %
Neutro Abs: 2.3 10*3/uL (ref 1.7–7.7)
Neutrophils Relative %: 58 %
Platelets: 308 10*3/uL (ref 150–400)
RBC: 1.98 MIL/uL — ABNORMAL LOW (ref 3.87–5.11)
RDW: 18 % — ABNORMAL HIGH (ref 11.5–15.5)
WBC: 3.9 10*3/uL — ABNORMAL LOW (ref 4.0–10.5)
nRBC: 0 % (ref 0.0–0.2)

## 2018-01-18 LAB — FERRITIN: Ferritin: 887 ng/mL — ABNORMAL HIGH (ref 11–307)

## 2018-01-18 LAB — SAMPLE TO BLOOD BANK

## 2018-01-18 LAB — IRON AND TIBC
Iron: 162 ug/dL (ref 28–170)
Saturation Ratios: 48 % — ABNORMAL HIGH (ref 10.4–31.8)
TIBC: 339 ug/dL (ref 250–450)
UIBC: 177 ug/dL

## 2018-01-18 LAB — PREPARE RBC (CROSSMATCH)

## 2018-01-19 ENCOUNTER — Inpatient Hospital Stay: Payer: Medicare Other | Attending: Oncology | Admitting: Oncology

## 2018-01-19 ENCOUNTER — Inpatient Hospital Stay: Payer: Medicare Other

## 2018-01-19 VITALS — BP 200/71 | HR 86 | Temp 98.4°F | Resp 20 | Wt 108.0 lb

## 2018-01-19 VITALS — BP 185/90 | HR 68 | Temp 97.8°F | Resp 20

## 2018-01-19 DIAGNOSIS — Z87891 Personal history of nicotine dependence: Secondary | ICD-10-CM | POA: Diagnosis not present

## 2018-01-19 DIAGNOSIS — D649 Anemia, unspecified: Secondary | ICD-10-CM | POA: Insufficient documentation

## 2018-01-19 DIAGNOSIS — D46C Myelodysplastic syndrome with isolated del(5q) chromosomal abnormality: Secondary | ICD-10-CM | POA: Diagnosis not present

## 2018-01-19 DIAGNOSIS — I6523 Occlusion and stenosis of bilateral carotid arteries: Secondary | ICD-10-CM | POA: Diagnosis not present

## 2018-01-19 DIAGNOSIS — D469 Myelodysplastic syndrome, unspecified: Secondary | ICD-10-CM

## 2018-01-19 MED ORDER — SODIUM CHLORIDE 0.9% IV SOLUTION
250.0000 mL | Freq: Once | INTRAVENOUS | Status: AC
  Administered 2018-01-19: 250 mL via INTRAVENOUS
  Filled 2018-01-19: qty 250

## 2018-01-19 MED ORDER — SODIUM CHLORIDE 0.9 % IV SOLN
15.0000 mg/kg/h | Freq: Once | INTRAVENOUS | Status: AC
  Administered 2018-01-19: 15 mg/kg/h via INTRAVENOUS
  Filled 2018-01-19: qty 2

## 2018-01-19 MED ORDER — ACETAMINOPHEN 325 MG PO TABS
650.0000 mg | ORAL_TABLET | Freq: Once | ORAL | Status: AC
  Administered 2018-01-19: 650 mg via ORAL
  Filled 2018-01-19: qty 2

## 2018-01-19 MED ORDER — HEPARIN SOD (PORK) LOCK FLUSH 100 UNIT/ML IV SOLN
500.0000 [IU] | Freq: Every day | INTRAVENOUS | Status: AC | PRN
  Administered 2018-01-19: 500 [IU]
  Filled 2018-01-19: qty 5

## 2018-01-19 MED ORDER — DIPHENHYDRAMINE HCL 25 MG PO CAPS
ORAL_CAPSULE | ORAL | Status: AC
  Filled 2018-01-19: qty 1

## 2018-01-19 MED ORDER — DIPHENHYDRAMINE HCL 25 MG PO TABS
25.0000 mg | ORAL_TABLET | Freq: Once | ORAL | Status: AC
  Administered 2018-01-19: 25 mg via ORAL
  Filled 2018-01-19: qty 1

## 2018-01-19 MED ORDER — SODIUM CHLORIDE 0.9% FLUSH
10.0000 mL | INTRAVENOUS | Status: AC | PRN
  Administered 2018-01-19: 10 mL
  Filled 2018-01-19: qty 10

## 2018-01-19 NOTE — Progress Notes (Signed)
Patient denies any concerns today.  

## 2018-01-20 LAB — BPAM RBC
Blood Product Expiration Date: 201911052359
ISSUE DATE / TIME: 201911011226
Unit Type and Rh: 6200

## 2018-01-20 LAB — TYPE AND SCREEN
ABO/RH(D): AB POS
Antibody Screen: NEGATIVE
Unit division: 0

## 2018-01-29 ENCOUNTER — Encounter (INDEPENDENT_AMBULATORY_CARE_PROVIDER_SITE_OTHER): Payer: Medicare Other

## 2018-01-29 ENCOUNTER — Ambulatory Visit (INDEPENDENT_AMBULATORY_CARE_PROVIDER_SITE_OTHER): Payer: Medicare Other | Admitting: Vascular Surgery

## 2018-01-29 ENCOUNTER — Encounter

## 2018-02-05 ENCOUNTER — Ambulatory Visit (INDEPENDENT_AMBULATORY_CARE_PROVIDER_SITE_OTHER): Payer: Medicare Other | Admitting: Urology

## 2018-02-05 ENCOUNTER — Encounter: Payer: Self-pay | Admitting: Urology

## 2018-02-05 VITALS — BP 159/73 | HR 83 | Ht 60.0 in | Wt 109.4 lb

## 2018-02-05 DIAGNOSIS — N2889 Other specified disorders of kidney and ureter: Secondary | ICD-10-CM | POA: Diagnosis not present

## 2018-02-05 LAB — URINALYSIS, COMPLETE
Bilirubin, UA: NEGATIVE
Glucose, UA: NEGATIVE
Ketones, UA: NEGATIVE
Nitrite, UA: NEGATIVE
Protein, UA: NEGATIVE
RBC, UA: NEGATIVE
Specific Gravity, UA: 1.015 (ref 1.005–1.030)
Urobilinogen, Ur: 0.2 mg/dL (ref 0.2–1.0)
pH, UA: 6 (ref 5.0–7.5)

## 2018-02-05 LAB — MICROSCOPIC EXAMINATION

## 2018-02-05 NOTE — Progress Notes (Signed)
02/05/2018 12:50 PM   Jacqueline Russo 1930-02-06 [id number]  Referring provider: [name], MD [place], [place] [address]  CC: Right renal mass  HPI: I saw Jacqueline Russo in urology clinic today in consultation for a right 1.7 cm enhancing renal mass from Dr. Sabra Heck.  She is an 84 year old female with CLL/MDS who was found to have a 1.7 cm lesion in the right renal upper pole on CT performed for abdominal bloating in October 2019.  Radiology recommended a follow-up MRI, which confirmed a 1.7 cm cystic lesion with enhancing thickened septations suspicious for neoplasm.  There is no evidence of metastatic disease.  She denies any weight loss, flank pain, or hematuria.  There is no family history of kidney cancer.  There are no aggravating or alleviating factors.  She also reports some weak stream with urination, however she has never required a catheter before.  There is no hydronephrosis on above imaging.   PMH: Past Medical History:  Diagnosis Date  . Anemia    Myelodysplastic syndrome  . Arthritis   . Hypertension   . Leukemia, chronic lymphoid (HCC)    CLL  . Ovarian cancer [place]) 1963    Surgical History: Past Surgical History:  Procedure Laterality Date  . ABDOMINAL HYSTERECTOMY    . APPENDECTOMY    . BREAST SURGERY     cyst removed from right breast  . EYE SURGERY Bilateral    Cataract Extractions  . IR FLUORO GUIDE CV LINE RIGHT  06/30/2017  . REVERSE SHOULDER ARTHROPLASTY Right 10/28/2014   Procedure: REVERSE SHOULDER ARTHROPLASTY;  Surgeon: Corky Mull, MD;  Location: [facility] ORS;  Service: Orthopedics;  Laterality: Right;    Allergies: No Known Allergies  Family History: No family history on file.  Social History:  reports that she has quit smoking. Her smoking use included cigarettes. She smoked 2.00 packs per day. She has never used smokeless tobacco. She reports that she drinks about 1.0 standard drinks of  alcohol per week. She reports that she does not use drugs.  ROS: Please see flowsheet from today's date for complete review of systems.  Physical Exam: BP (!) 159/73 (BP Location: Left Arm, Patient Position: Sitting, Cuff Size: Normal)   Pulse 83   Ht 5' (1.524 m)   Wt 109 lb 6.4 oz (49.6 kg)   BMI 21.37 kg/m    Constitutional:  Alert and oriented, No acute distress. Cardiovascular: No clubbing, cyanosis, or edema. Respiratory: Normal respiratory effort, no increased work of breathing. GI: Abdomen is soft, nontender, nondistended, no abdominal masses GU: No CVA tenderness Lymph: No cervical or inguinal lymphadenopathy. Skin: No rashes, bruises or suspicious lesions. Neurologic: Grossly intact, no focal deficits, moving all 4 extremities. Psychiatric: Normal mood and affect.  Pertinent Imaging: I have personally reviewed the CT and MRI from October 2019.  1.7 cm right upper pole cystic lesion with enhancing septations concerning for malignancy.  Assessment & Plan:   In summary, Jacqueline Russo is an 84 year old female with CLL/MDS who presents with incidental finding of a 1.7 cm enhancing right cystic renal mass concerning for malignancy.  We had a very long conversation about enhancing small renal masses, and options moving forward including possible biopsy, active surveillance, partial nephrectomy, or tumor ablation.  We discussed that the rate of developing metastatic disease in renal mass smaller than 4 cm is less than 3 to 4% at 10 to 15 years.  With her comorbidities and advanced age,  active surveillance is certainly the best option.  She is in agreement.  She will follow-up in 6 months with a repeat renal ultrasound, if the mass is significantly enlarging at that time could consider biopsy and/or ablation.  Return in about 6 months (around 08/06/2018) for renal ultrasound.  Billey Co, [facility] [address], [place], [place]  [address] [phone]

## 2018-02-12 ENCOUNTER — Other Ambulatory Visit: Payer: Self-pay | Admitting: *Deleted

## 2018-02-12 MED ORDER — HYDROCODONE-ACETAMINOPHEN 5-325 MG PO TABS: 1.0000 | ORAL_TABLET | ORAL | 0 refills | Status: DC | PRN

## 2018-02-12 NOTE — Telephone Encounter (Signed)
Patient called Jacqueline Russo requesting refill of Norco.   As mandated by the [place] STOP Act (Strengthen Opioid Misuse Prevention), the [place] Controlled Substance Reporting System ([place]) was reviewed for this patient.  Below is the past 38-months of controlled substance prescriptions as displayed by the registry.  I have personally consulted with my supervising physician, Dr. Grayland Ormond, who agrees that continuation of opiate therapy is medically appropriate at this time and agrees to provide continual monitoring, including urine/blood drug screens, as indicated. Prescription sent electronically using [employer] secure transmission to requested pharmacy.   [place] Reviewed:     Beckey Rutter, DNP, AGNP-C [place] [facility] [phone] (work cell) [phone] (office) 02/12/18 4:06 PM

## 2018-02-13 ENCOUNTER — Other Ambulatory Visit: Payer: Self-pay | Admitting: *Deleted

## 2018-02-13 MED ORDER — HYDROCODONE-ACETAMINOPHEN 5-325 MG PO TABS: 1.0000 | ORAL_TABLET | ORAL | 0 refills | Status: DC | PRN

## 2018-02-13 NOTE — Telephone Encounter (Signed)
She was just presrcibed Xanax by Dr. Emily Filbert on 11/11 (90 tablets) for 90 days. How do I reject the xanax? The Norco is ok.

## 2018-02-13 NOTE — Telephone Encounter (Signed)
Medicine sent to wrong pharmacy

## 2018-02-19 NOTE — Progress Notes (Deleted)
[place]  [phone] [phone]) [phone]  ID: Chaney Born OB: Oct 15, 1929  MR#: [id number]  HGD#:[id number]  Patient Care Team: [name], MD as PCP - General (Internal Medicine)  CHIEF COMPLAINT: MDS 5q-, CLL  INTERVAL HISTORY: Patient returns to clinic today for further evaluation and blood transfusion.  She continues to have chronic weakness and fatigue.  She recently had imaging completed for increased abdominal bloating and mild tenderness.  She otherwise has felt well. She has no neurologic complaints. She denies any recent fevers or illnesses. She denies any night sweats or weight loss. She has no chest pain or shortness of breath. She denies any nausea, vomiting, constipation, or diarrhea. She denies any melena or hematochezia. She has no urinary complaints.  Patient offers no further specific complaints today.    REVIEW OF SYSTEMS:   Review of Systems  Constitutional: Positive for malaise/fatigue. Negative for fever and weight loss.  Respiratory: Negative.  Negative for cough and shortness of breath.   Cardiovascular: Negative.  Negative for chest pain and leg swelling.  Gastrointestinal: Negative.  Negative for abdominal pain, blood in stool and melena.  Genitourinary: Negative.  Negative for dysuria.  Musculoskeletal: Positive for back pain and joint pain.  Skin: Negative.  Negative for rash.  Neurological: Positive for weakness. Negative for dizziness, sensory change, focal weakness and headaches.  Endo/Heme/Allergies: Does not bruise/bleed easily.  Psychiatric/Behavioral: Negative.  The patient is not nervous/anxious.     As per HPI. Otherwise, a complete review of systems is negative.  PAST MEDICAL HISTORY: Past Medical History:  Diagnosis Date  . Anemia    Myelodysplastic syndrome  . Arthritis   . Hypertension   . Leukemia, chronic lymphoid (HCC)    CLL  . Ovarian cancer ([place]) 1963    PAST SURGICAL HISTORY: Past Surgical  History:  Procedure Laterality Date  . ABDOMINAL HYSTERECTOMY    . APPENDECTOMY    . BREAST SURGERY     cyst removed from right breast  . EYE SURGERY Bilateral    Cataract Extractions  . IR FLUORO GUIDE CV LINE RIGHT  06/30/2017  . REVERSE SHOULDER ARTHROPLASTY Right 10/28/2014   Procedure: REVERSE SHOULDER ARTHROPLASTY;  Surgeon: Corky Mull, MD;  Location: [facility] ORS;  Service: Orthopedics;  Laterality: Right;    FAMILY HISTORY: Reviewed and unchanged. No reported history of malignancy or chronic disease.     ADVANCED DIRECTIVES:    HEALTH MAINTENANCE: Social History   Tobacco Use  . Smoking status: Former Smoker    Packs/day: 2.00    Types: Cigarettes  . Smokeless tobacco: Never Used  Substance Use Topics  . Alcohol use: Yes    Alcohol/week: 1.0 standard drinks    Types: 1 Glasses of wine per week    Comment: 1 glass of wine daily  . Drug use: No     Colonoscopy:  PAP:  Bone density:  Lipid panel:  No Known Allergies  Current Outpatient Medications  Medication Sig Dispense Refill  . ALPRAZolam (XANAX) 0.25 MG tablet Take 1 tablet (0.25 mg total) by mouth daily. As needed. 30 tablet 0  . amLODipine (NORVASC) 10 MG tablet     . aspirin 81 MG tablet Take 81 mg by mouth daily.    . cyanocobalamin (,VITAMIN B-12,) 1000 MCG/ML injection Inject into the muscle.    . diphenhydrAMINE (BENADRYL) 50 MG tablet Take 50 mg by mouth at bedtime as needed for itching.    . hydrALAZINE (APRESOLINE) 25 MG tablet Take  by mouth.     . hydrochlorothiazide (HYDRODIURIL) 12.5 MG tablet Take 12.5 mg by mouth daily.     Marland Kitchen HYDROcodone-acetaminophen (NORCO/VICODIN) 5-325 MG tablet Take 1-2 tablets by mouth every 4 (four) hours as needed for moderate pain or severe pain. 60 tablet 0  . lidocaine-prilocaine (EMLA) cream Apply 1 application topically as needed. Apply to port 1-2 hours prior to appointment. Cover with plastic wrap. 30 g 0  . olmesartan (BENICAR) 20 MG tablet Take by mouth.    .  SF 1.1 % GEL dental gel     . tiZANidine (ZANAFLEX) 2 MG tablet Take 1 tablet (2 mg total) by mouth at bedtime as needed for muscle spasms. 30 tablet 0  . traMADol-acetaminophen (ULTRACET) 37.5-325 MG per tablet Take 1 tablet by mouth every 8 (eight) hours as needed.     No current facility-administered medications for this visit.     OBJECTIVE: There were no vitals filed for this visit.   There is no height or weight on file to calculate BMI.    ECOG FS:0 - Asymptomatic  General: Thin, no acute distress. Eyes: Pink conjunctiva, anicteric sclera. HEENT: Normocephalic, moist mucous membranes. Lungs: Clear to auscultation bilaterally. Heart: Regular rate and rhythm. No rubs, murmurs, or gallops. Abdomen: Soft, nontender, nondistended. No organomegaly noted, normoactive bowel sounds. Musculoskeletal: No edema, cyanosis, or clubbing. Neuro: Alert, answering all questions appropriately. Cranial nerves grossly intact. Skin: No rashes or petechiae noted. Psych: Normal affect.  LAB RESULTS:  Lab Results  Component Value Date   NA 130 (L) 03/16/2017   K 3.3 (L) 03/16/2017   CL 97 (L) 03/16/2017   CO2 22 03/16/2017   GLUCOSE 147 (H) 03/16/2017   BUN 19 03/16/2017   CREATININE 0.90 03/16/2017   CALCIUM 9.5 03/16/2017   PROT 7.3 03/27/2013   ALBUMIN 4.1 03/27/2013   AST 12 (L) 03/27/2013   ALT 20 03/27/2013   ALKPHOS 87 03/27/2013   BILITOT 0.5 03/27/2013   GFRNONAA 57 (L) 03/16/2017   GFRAA >60 03/16/2017    Lab Results  Component Value Date   WBC 3.9 (L) 01/18/2018   NEUTROABS 2.3 01/18/2018   HGB 7.9 (L) 01/18/2018   HCT 22.7 (L) 01/18/2018   MCV 114.6 (H) 01/18/2018   PLT 308 01/18/2018   Lab Results  Component Value Date   IRON 162 01/18/2018   TIBC 339 01/18/2018   IRONPCTSAT 48 (H) 01/18/2018   Lab Results  Component Value Date   FERRITIN 887 (H) 01/18/2018     STUDIES: No results found.  ASSESSMENT: MDS 5q-, CLL   PLAN:    1. MDS, 5q-: Bone marrow  biopsy results from June 30, 2017 reviewed independently confirming progression of patient's MDS, 5q-. She has a hypercellular marrow for age as well as an increased blast count up to 6%.  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  Patient continues to decline treatment with Revlimid.  Previously, we also discussed the possibility of progressing to AML, but patient wishes no further interventions other than periodic blood transfusions.  Proceed with 1 unit of packed red blood cells today.  Patient also requires Desferal with every infusion.  Return to clinic in 1 month with repeat laboratory work, further evaluation, and consideration of additional blood.   2.  Anemia: Repeat bone marrow biopsy as above.  Secondary to MDS.  Transfusion as above.   3.  Elevated iron stores: Improving.  Desferal as above.   4.  CLL:  Bone marrow biopsy did not mention any involvement of CLL.  White count is within normal limits.  CT scan results from June 09, 2014 were independently reviewed and did not reveal any evidence of recurrent disease. All of her labwork is also either negative or within normal limits. Patient last received single agent Rituxan in February 2015.  5.  Anxiety: Continue Xanax as needed. 6.  Hypertension: Patient's blood pressure remains significantly elevated today.  Unclear her compliance with her medications.  Will forward results to primary care for further evaluation. 7.  Back pain: Patient does not complain of this today. 8.  Abdominal bloating: MRI results from January 16, 2018 reviewed independently report as above.  No distinct etiology was noted, but incidentally a 1.7 cm cystic lesion suspicious for renal neoplasm was reported.  Continue follow-up with primary care.  Patient will likely need reimaging in 6 to 12 months to assess interval change.   Patient expressed understanding and was in agreement with this plan. She also understands that She can  call clinic at any time with any questions, concerns, or complaints.   Lloyd Huger, MD   02/19/2018 12:05 AM

## 2018-02-22 ENCOUNTER — Inpatient Hospital Stay: Payer: Medicare Other | Attending: Oncology

## 2018-02-22 DIAGNOSIS — D46C Myelodysplastic syndrome with isolated del(5q) chromosomal abnormality: Secondary | ICD-10-CM | POA: Diagnosis not present

## 2018-02-22 LAB — CBC WITH DIFFERENTIAL/PLATELET
Abs Immature Granulocytes: 0.07 10*3/uL (ref 0.00–0.07)
Basophils Absolute: 0.1 10*3/uL (ref 0.0–0.1)
Basophils Relative: 2 %
Eosinophils Absolute: 0.1 10*3/uL (ref 0.0–0.5)
Eosinophils Relative: 3 %
HCT: 25.1 % — ABNORMAL LOW (ref 36.0–46.0)
Hemoglobin: 8.6 g/dL — ABNORMAL LOW (ref 12.0–15.0)
Immature Granulocytes: 2 %
Lymphocytes Relative: 19 %
Lymphs Abs: 0.9 10*3/uL (ref 0.7–4.0)
MCH: 38.4 pg — ABNORMAL HIGH (ref 26.0–34.0)
MCHC: 34.3 g/dL (ref 30.0–36.0)
MCV: 112.1 fL — ABNORMAL HIGH (ref 80.0–100.0)
Monocytes Absolute: 0.5 10*3/uL (ref 0.1–1.0)
Monocytes Relative: 10 %
Neutro Abs: 3 10*3/uL (ref 1.7–7.7)
Neutrophils Relative %: 64 %
Platelets: 308 10*3/uL (ref 150–400)
RBC: 2.24 MIL/uL — ABNORMAL LOW (ref 3.87–5.11)
RDW: 20.4 % — ABNORMAL HIGH (ref 11.5–15.5)
WBC: 4.7 10*3/uL (ref 4.0–10.5)
nRBC: 0 % (ref 0.0–0.2)

## 2018-02-22 LAB — SAMPLE TO BLOOD BANK

## 2018-02-22 LAB — IRON AND TIBC
Iron: 249 ug/dL — ABNORMAL HIGH (ref 28–170)
Saturation Ratios: 70 % — ABNORMAL HIGH (ref 10.4–31.8)
TIBC: 358 ug/dL (ref 250–450)
UIBC: 109 ug/dL

## 2018-02-22 LAB — FERRITIN: Ferritin: 965 ng/mL — ABNORMAL HIGH (ref 11–307)

## 2018-02-23 ENCOUNTER — Ambulatory Visit: Payer: Medicare Other

## 2018-02-23 ENCOUNTER — Ambulatory Visit: Payer: Medicare Other | Admitting: Oncology

## 2018-02-28 DIAGNOSIS — N2889 Other specified disorders of kidney and ureter: Secondary | ICD-10-CM | POA: Insufficient documentation

## 2018-03-16 NOTE — Progress Notes (Deleted)
[place]  Telephone:(336) [phone] Fax:(336) [phone]  ID: Jacqueline Russo Born OB: 10-09-1929  MR#: [id number]  EHU#:[id number]  Patient Care Team: [name], MD as PCP - General (Internal Medicine)  CHIEF COMPLAINT: MDS 5q-, CLL  INTERVAL HISTORY: Patient returns to clinic today for further evaluation and blood transfusion.  She continues to have chronic weakness and fatigue.  She recently had imaging completed for increased abdominal bloating and mild tenderness.  She otherwise has felt well. She has no neurologic complaints. She denies any recent fevers or illnesses. She denies any night sweats or weight loss. She has no chest pain or shortness of breath. She denies any nausea, vomiting, constipation, or diarrhea. She denies any melena or hematochezia. She has no urinary complaints.  Patient offers no further specific complaints today.    REVIEW OF SYSTEMS:   Review of Systems  Constitutional: Positive for malaise/fatigue. Negative for fever and weight loss.  Respiratory: Negative.  Negative for cough and shortness of breath.   Cardiovascular: Negative.  Negative for chest pain and leg swelling.  Gastrointestinal: Negative.  Negative for abdominal pain, blood in stool and melena.  Genitourinary: Negative.  Negative for dysuria.  Musculoskeletal: Positive for back pain and joint pain.  Skin: Negative.  Negative for rash.  Neurological: Positive for weakness. Negative for dizziness, sensory change, focal weakness and headaches.  Endo/Heme/Allergies: Does not bruise/bleed easily.  Psychiatric/Behavioral: Negative.  The patient is not nervous/anxious.     As per HPI. Otherwise, a complete review of systems is negative.  PAST MEDICAL HISTORY: Past Medical History:  Diagnosis Date  . Anemia    Myelodysplastic syndrome  . Arthritis   . Hypertension   . Leukemia, chronic lymphoid (HCC)    CLL  . Ovarian cancer ([place]) 1963    PAST SURGICAL HISTORY: Past Surgical  History:  Procedure Laterality Date  . ABDOMINAL HYSTERECTOMY    . APPENDECTOMY    . BREAST SURGERY     cyst removed from right breast  . EYE SURGERY Bilateral    Cataract Extractions  . IR FLUORO GUIDE CV LINE RIGHT  06/30/2017  . REVERSE SHOULDER ARTHROPLASTY Right 10/28/2014   Procedure: REVERSE SHOULDER ARTHROPLASTY;  Surgeon: Corky Mull, MD;  Location: [facility] ORS;  Service: Orthopedics;  Laterality: Right;    FAMILY HISTORY: Reviewed and unchanged. No reported history of malignancy or chronic disease.     ADVANCED DIRECTIVES:    HEALTH MAINTENANCE: Social History   Tobacco Use  . Smoking status: Former Smoker    Packs/day: 2.00    Types: Cigarettes  . Smokeless tobacco: Never Used  Substance Use Topics  . Alcohol use: Yes    Alcohol/week: 1.0 standard drinks    Types: 1 Glasses of wine per week    Comment: 1 glass of wine daily  . Drug use: No     Colonoscopy:  PAP:  Bone density:  Lipid panel:  No Known Allergies  Current Outpatient Medications  Medication Sig Dispense Refill  . ALPRAZolam (XANAX) 0.25 MG tablet Take 1 tablet (0.25 mg total) by mouth daily. As needed. 30 tablet 0  . amLODipine (NORVASC) 10 MG tablet     . aspirin 81 MG tablet Take 81 mg by mouth daily.    . cyanocobalamin (,VITAMIN B-12,) 1000 MCG/ML injection Inject into the muscle.    . diphenhydrAMINE (BENADRYL) 50 MG tablet Take 50 mg by mouth at bedtime as needed for itching.    . hydrALAZINE (APRESOLINE) 25 MG tablet Take  by mouth.     . hydrochlorothiazide (HYDRODIURIL) 12.5 MG tablet Take 12.5 mg by mouth daily.     Marland Kitchen HYDROcodone-acetaminophen (NORCO/VICODIN) 5-325 MG tablet Take 1-2 tablets by mouth every 4 (four) hours as needed for moderate pain or severe pain. 60 tablet 0  . lidocaine-prilocaine (EMLA) cream Apply 1 application topically as needed. Apply to port 1-2 hours prior to appointment. Cover with plastic wrap. 30 g 0  . olmesartan (BENICAR) 20 MG tablet Take by mouth.    .  SF 1.1 % GEL dental gel     . tiZANidine (ZANAFLEX) 2 MG tablet Take 1 tablet (2 mg total) by mouth at bedtime as needed for muscle spasms. 30 tablet 0  . traMADol-acetaminophen (ULTRACET) 37.5-325 MG per tablet Take 1 tablet by mouth every 8 (eight) hours as needed.     No current facility-administered medications for this visit.     OBJECTIVE: There were no vitals filed for this visit.   There is no height or weight on file to calculate BMI.    ECOG FS:0 - Asymptomatic  General: Thin, no acute distress. Eyes: Pink conjunctiva, anicteric sclera. HEENT: Normocephalic, moist mucous membranes. Lungs: Clear to auscultation bilaterally. Heart: Regular rate and rhythm. No rubs, murmurs, or gallops. Abdomen: Soft, nontender, nondistended. No organomegaly noted, normoactive bowel sounds. Musculoskeletal: No edema, cyanosis, or clubbing. Neuro: Alert, answering all questions appropriately. Cranial nerves grossly intact. Skin: No rashes or petechiae noted. Psych: Normal affect.  LAB RESULTS:  Lab Results  Component Value Date   NA 130 (L) 03/16/2017   K 3.3 (L) 03/16/2017   CL 97 (L) 03/16/2017   CO2 22 03/16/2017   GLUCOSE 147 (H) 03/16/2017   BUN 19 03/16/2017   CREATININE 0.90 03/16/2017   CALCIUM 9.5 03/16/2017   PROT 7.3 03/27/2013   ALBUMIN 4.1 03/27/2013   AST 12 (L) 03/27/2013   ALT 20 03/27/2013   ALKPHOS 87 03/27/2013   BILITOT 0.5 03/27/2013   GFRNONAA 57 (L) 03/16/2017   GFRAA >60 03/16/2017    Lab Results  Component Value Date   WBC 4.7 02/22/2018   NEUTROABS 3.0 02/22/2018   HGB 8.6 (L) 02/22/2018   HCT 25.1 (L) 02/22/2018   MCV 112.1 (H) 02/22/2018   PLT 308 02/22/2018   Lab Results  Component Value Date   IRON 249 (H) 02/22/2018   TIBC 358 02/22/2018   IRONPCTSAT 70 (H) 02/22/2018   Lab Results  Component Value Date   FERRITIN 965 (H) 02/22/2018     STUDIES: No results found.  ASSESSMENT: MDS 5q-, CLL   PLAN:    1. MDS, 5q-: Bone marrow  biopsy results from June 30, 2017 reviewed independently confirming progression of patient's MDS, 5q-. She has a hypercellular marrow for age as well as an increased blast count up to 6%.  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  Patient continues to decline treatment with Revlimid.  Previously, we also discussed the possibility of progressing to AML, but patient wishes no further interventions other than periodic blood transfusions.  Proceed with 1 unit of packed red blood cells today.  Patient also requires Desferal with every infusion.  Return to clinic in 1 month with repeat laboratory work, further evaluation, and consideration of additional blood.   2.  Anemia: Repeat bone marrow biopsy as above.  Secondary to MDS.  Transfusion as above.   3.  Elevated iron stores: Improving.  Desferal as above.   4.  CLL:  Bone marrow biopsy did not mention any involvement of CLL.  White count is within normal limits.  CT scan results from June 09, 2014 were independently reviewed and did not reveal any evidence of recurrent disease. All of her labwork is also either negative or within normal limits. Patient last received single agent Rituxan in February 2015.  5.  Anxiety: Continue Xanax as needed. 6.  Hypertension: Patient's blood pressure remains significantly elevated today.  Unclear her compliance with her medications.  Will forward results to primary care for further evaluation. 7.  Back pain: Patient does not complain of this today. 8.  Abdominal bloating: MRI results from January 16, 2018 reviewed independently report as above.  No distinct etiology was noted, but incidentally a 1.7 cm cystic lesion suspicious for renal neoplasm was reported.  Continue follow-up with primary care.  Patient will likely need reimaging in 6 to 12 months to assess interval change.   Patient expressed understanding and was in agreement with this plan. She also understands that She can  call clinic at any time with any questions, concerns, or complaints.   Lloyd Huger, MD   03/16/2018 11:41 AM

## 2018-03-22 ENCOUNTER — Inpatient Hospital Stay: Payer: Medicare Other | Attending: Oncology

## 2018-03-22 DIAGNOSIS — D46C Myelodysplastic syndrome with isolated del(5q) chromosomal abnormality: Secondary | ICD-10-CM | POA: Diagnosis not present

## 2018-03-22 LAB — IRON AND TIBC
Iron: 265 ug/dL — ABNORMAL HIGH (ref 28–170)
Saturation Ratios: 80 % — ABNORMAL HIGH (ref 10.4–31.8)
TIBC: 333 ug/dL (ref 250–450)
UIBC: 68 ug/dL

## 2018-03-22 LAB — CBC WITH DIFFERENTIAL/PLATELET
Abs Immature Granulocytes: 0.02 K/uL (ref 0.00–0.07)
Basophils Absolute: 0.1 K/uL (ref 0.0–0.1)
Basophils Relative: 2 %
Eosinophils Absolute: 0.2 K/uL (ref 0.0–0.5)
Eosinophils Relative: 5 %
HCT: 25.5 % — ABNORMAL LOW (ref 36.0–46.0)
Hemoglobin: 8.6 g/dL — ABNORMAL LOW (ref 12.0–15.0)
Immature Granulocytes: 1 %
Lymphocytes Relative: 31 %
Lymphs Abs: 1.2 K/uL (ref 0.7–4.0)
MCH: 39.1 pg — ABNORMAL HIGH (ref 26.0–34.0)
MCHC: 33.7 g/dL (ref 30.0–36.0)
MCV: 115.9 fL — ABNORMAL HIGH (ref 80.0–100.0)
Monocytes Absolute: 0.4 K/uL (ref 0.1–1.0)
Monocytes Relative: 11 %
Neutro Abs: 1.9 K/uL (ref 1.7–7.7)
Neutrophils Relative %: 50 %
Platelets: 399 K/uL (ref 150–400)
RBC: 2.2 MIL/uL — ABNORMAL LOW (ref 3.87–5.11)
RDW: 17.6 % — ABNORMAL HIGH (ref 11.5–15.5)
WBC: 3.7 K/uL — ABNORMAL LOW (ref 4.0–10.5)
nRBC: 0 % (ref 0.0–0.2)

## 2018-03-22 LAB — FERRITIN: Ferritin: 1031 ng/mL — ABNORMAL HIGH (ref 11–307)

## 2018-03-22 LAB — SAMPLE TO BLOOD BANK

## 2018-03-23 ENCOUNTER — Ambulatory Visit: Payer: Medicare Other | Admitting: Oncology

## 2018-03-23 ENCOUNTER — Ambulatory Visit: Payer: Medicare Other

## 2018-04-03 ENCOUNTER — Other Ambulatory Visit: Payer: Self-pay | Admitting: *Deleted

## 2018-04-03 MED ORDER — HYDROCODONE-ACETAMINOPHEN 5-325 MG PO TABS: 1.0000 | ORAL_TABLET | ORAL | 0 refills | Status: DC | PRN

## 2018-04-03 NOTE — Telephone Encounter (Signed)
Narcotic refill request: Norco  As mandated by the [place] STOP Act (Strengthen Opioid Misuse Prevention), the [place] reviewed prior to consideration of refills as below:     Given a current oncology diagnosis, this patient has the potential to experience significant cancer related pain. Benefits versus risks associated with continued therapy considered. Will continue pain management with opioids as previously prescribed.   Patient educated that medications should not be bitten, chewed, or crushed. Additionally, safety precautions reviewed. Patient verbalized understanding that medications should not be sold or shared, taken with alcohol, or used while driving. She has been made aware of the side effects of using this medication. Patient understands that this medication can cause CNS depression, increase her risk of falls, and even lead to overdose that may result in death, if used outside of the parameters that she and I discussed.   With all of this in mind, she accepts the risks and responsibilities associated with therapy and elects to continue to use the prescribed interventions. As supervising physician, Dr. Grayland Ormond, agrees that continuation of opioid therapy is medically appropriate at this time and agrees to provide continual monitoring, including urine/blood drug screens, as indicated.   Refill prescription sent electronically using [employer] secure transmission to requested pharmacy:  1. Quinn, AGNP-C [place] [facility] [phone] (work cell) [phone] (office)

## 2018-04-15 NOTE — Progress Notes (Deleted)
[facility]  [phone] [phone]) [phone]  ID: Chaney Born OB: [birth year]  MR#: [id number]  KCL#:[id number]  Patient Care Team: [name], MD as PCP - General (Internal Medicine)  CHIEF COMPLAINT: MDS 5q-, CLL  INTERVAL HISTORY: Patient returns to clinic today for further evaluation and blood transfusion.  She continues to have chronic weakness and fatigue.  She recently had imaging completed for increased abdominal bloating and mild tenderness.  She otherwise has felt well. She has no neurologic complaints. She denies any recent fevers or illnesses. She denies any night sweats or weight loss. She has no chest pain or shortness of breath. She denies any nausea, vomiting, constipation, or diarrhea. She denies any melena or hematochezia. She has no urinary complaints.  Patient offers no further specific complaints today.    REVIEW OF SYSTEMS:   Review of Systems  Constitutional: Positive for malaise/fatigue. Negative for fever and weight loss.  Respiratory: Negative.  Negative for cough and shortness of breath.   Cardiovascular: Negative.  Negative for chest pain and leg swelling.  Gastrointestinal: Negative.  Negative for abdominal pain, blood in stool and melena.  Genitourinary: Negative.  Negative for dysuria.  Musculoskeletal: Positive for back pain and joint pain.  Skin: Negative.  Negative for rash.  Neurological: Positive for weakness. Negative for dizziness, sensory change, focal weakness and headaches.  Endo/Heme/Allergies: Does not bruise/bleed easily.  Psychiatric/Behavioral: Negative.  The patient is not nervous/anxious.     As per HPI. Otherwise, a complete review of systems is negative.  PAST MEDICAL HISTORY: Past Medical History:  Diagnosis Date  . Anemia    Myelodysplastic syndrome  . Arthritis   . Hypertension   . Leukemia, chronic lymphoid (HCC)    CLL  . Ovarian cancer ([place]) 1963    PAST SURGICAL HISTORY: Past Surgical  History:  Procedure Laterality Date  . ABDOMINAL HYSTERECTOMY    . APPENDECTOMY    . BREAST SURGERY     cyst removed from right breast  . EYE SURGERY Bilateral    Cataract Extractions  . IR FLUORO GUIDE CV LINE RIGHT  06/30/2017  . REVERSE SHOULDER ARTHROPLASTY Right 10/28/2014   Procedure: REVERSE SHOULDER ARTHROPLASTY;  Surgeon: Corky Mull, MD;  Location: [facility] ORS;  Service: Orthopedics;  Laterality: Right;    FAMILY HISTORY: Reviewed and unchanged. No reported history of malignancy or chronic disease.     ADVANCED DIRECTIVES:    HEALTH MAINTENANCE: Social History   Tobacco Use  . Smoking status: Former Smoker    Packs/day: 2.00    Types: Cigarettes  . Smokeless tobacco: Never Used  Substance Use Topics  . Alcohol use: Yes    Alcohol/week: 1.0 standard drinks    Types: 1 Glasses of wine per week    Comment: 1 glass of wine daily  . Drug use: No     Colonoscopy:  PAP:  Bone density:  Lipid panel:  No Known Allergies  Current Outpatient Medications  Medication Sig Dispense Refill  . ALPRAZolam (XANAX) 0.25 MG tablet Take 1 tablet (0.25 mg total) by mouth daily. As needed. 30 tablet 0  . amLODipine (NORVASC) 10 MG tablet     . aspirin 81 MG tablet Take 81 mg by mouth daily.    . cyanocobalamin (,VITAMIN B-12,) 1000 MCG/ML injection Inject into the muscle.    . diphenhydrAMINE (BENADRYL) 50 MG tablet Take 50 mg by mouth at bedtime as needed for itching.    . hydrALAZINE (APRESOLINE) 25 MG tablet Take  by mouth.     . hydrochlorothiazide (HYDRODIURIL) 12.5 MG tablet Take 12.5 mg by mouth daily.     Marland Kitchen HYDROcodone-acetaminophen (NORCO/VICODIN) 5-325 MG tablet Take 1-2 tablets by mouth every 4 (four) hours as needed for moderate pain or severe pain. 60 tablet 0  . lidocaine-prilocaine (EMLA) cream Apply 1 application topically as needed. Apply to port 1-2 hours prior to appointment. Cover with plastic wrap. 30 g 0  . olmesartan (BENICAR) 20 MG tablet Take by mouth.    .  SF 1.1 % GEL dental gel     . tiZANidine (ZANAFLEX) 2 MG tablet Take 1 tablet (2 mg total) by mouth at bedtime as needed for muscle spasms. 30 tablet 0  . traMADol-acetaminophen (ULTRACET) 37.5-325 MG per tablet Take 1 tablet by mouth every 8 (eight) hours as needed.     No current facility-administered medications for this visit.     OBJECTIVE: There were no vitals filed for this visit.   There is no height or weight on file to calculate BMI.    ECOG FS:0 - Asymptomatic  General: Thin, no acute distress. Eyes: Pink conjunctiva, anicteric sclera. HEENT: Normocephalic, moist mucous membranes. Lungs: Clear to auscultation bilaterally. Heart: Regular rate and rhythm. No rubs, murmurs, or gallops. Abdomen: Soft, nontender, nondistended. No organomegaly noted, normoactive bowel sounds. Musculoskeletal: No edema, cyanosis, or clubbing. Neuro: Alert, answering all questions appropriately. Cranial nerves grossly intact. Skin: No rashes or petechiae noted. Psych: Normal affect.  LAB RESULTS:  Lab Results  Component Value Date   NA 130 (L) 03/16/2017   K 3.3 (L) 03/16/2017   CL 97 (L) 03/16/2017   CO2 22 03/16/2017   GLUCOSE 147 (H) 03/16/2017   BUN 19 03/16/2017   CREATININE 0.90 03/16/2017   CALCIUM 9.5 03/16/2017   PROT 7.3 03/27/2013   ALBUMIN 4.1 03/27/2013   AST 12 (L) 03/27/2013   ALT 20 03/27/2013   ALKPHOS 87 03/27/2013   BILITOT 0.5 03/27/2013   GFRNONAA 57 (L) 03/16/2017   GFRAA >60 03/16/2017    Lab Results  Component Value Date   WBC 3.7 (L) 03/22/2018   NEUTROABS 1.9 03/22/2018   HGB 8.6 (L) 03/22/2018   HCT 25.5 (L) 03/22/2018   MCV 115.9 (H) 03/22/2018   PLT 399 03/22/2018   Lab Results  Component Value Date   IRON 265 (H) 03/22/2018   TIBC 333 03/22/2018   IRONPCTSAT 80 (H) 03/22/2018   Lab Results  Component Value Date   FERRITIN 1,031 (H) 03/22/2018     STUDIES: No results found.  ASSESSMENT: MDS 5q-, CLL   PLAN:    1. MDS, 5q-: Bone  marrow biopsy results from June 30, 2017 reviewed independently confirming progression of patient's MDS, 5q-. She has a hypercellular marrow for age as well as an increased blast count up to 6%.  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  Patient continues to decline treatment with Revlimid.  Previously, we also discussed the possibility of progressing to AML, but patient wishes no further interventions other than periodic blood transfusions.  Proceed with 1 unit of packed red blood cells today.  Patient also requires Desferal with every infusion.  Return to clinic in 1 month with repeat laboratory work, further evaluation, and consideration of additional blood.   2.  Anemia: Repeat bone marrow biopsy as above.  Secondary to MDS.  Transfusion as above.   3.  Elevated iron stores: Improving.  Desferal as above.   4.  CLL: Bone marrow biopsy did not mention any involvement of CLL.  White count is within normal limits.  CT scan results from June 09, 2014 were independently reviewed and did not reveal any evidence of recurrent disease. All of her labwork is also either negative or within normal limits. Patient last received single agent Rituxan in February 2015.  5.  Anxiety: Continue Xanax as needed. 6.  Hypertension: Patient's blood pressure remains significantly elevated today.  Unclear her compliance with her medications.  Will forward results to primary care for further evaluation. 7.  Back pain: Patient does not complain of this today. 8.  Abdominal bloating: MRI results from January 16, 2018 reviewed independently report as above.  No distinct etiology was noted, but incidentally a 1.7 cm cystic lesion suspicious for renal neoplasm was reported.  Continue follow-up with primary care.  Patient will likely need reimaging in 6 to 12 months to assess interval change.   Patient expressed understanding and was in agreement with this plan. She also understands that  She can call clinic at any time with any questions, concerns, or complaints.   Lloyd Huger, MD   04/15/2018 11:33 PM

## 2018-04-19 ENCOUNTER — Inpatient Hospital Stay: Payer: Medicare Other

## 2018-04-19 DIAGNOSIS — D46C Myelodysplastic syndrome with isolated del(5q) chromosomal abnormality: Secondary | ICD-10-CM | POA: Diagnosis not present

## 2018-04-19 LAB — CBC WITH DIFFERENTIAL/PLATELET
Abs Immature Granulocytes: 0.11 10*3/uL — ABNORMAL HIGH (ref 0.00–0.07)
Basophils Absolute: 0.1 10*3/uL (ref 0.0–0.1)
Basophils Relative: 3 %
Eosinophils Absolute: 0.1 10*3/uL (ref 0.0–0.5)
Eosinophils Relative: 3 %
HCT: 25.8 % — ABNORMAL LOW (ref 36.0–46.0)
Hemoglobin: 8.5 g/dL — ABNORMAL LOW (ref 12.0–15.0)
Immature Granulocytes: 2 %
Lymphocytes Relative: 20 %
Lymphs Abs: 1 10*3/uL (ref 0.7–4.0)
MCH: 39.5 pg — ABNORMAL HIGH (ref 26.0–34.0)
MCHC: 32.9 g/dL (ref 30.0–36.0)
MCV: 120 fL — ABNORMAL HIGH (ref 80.0–100.0)
Monocytes Absolute: 0.4 10*3/uL (ref 0.1–1.0)
Monocytes Relative: 8 %
Neutro Abs: 3.5 10*3/uL (ref 1.7–7.7)
Neutrophils Relative %: 64 %
Platelets: 329 10*3/uL (ref 150–400)
RBC: 2.15 MIL/uL — ABNORMAL LOW (ref 3.87–5.11)
RDW: 16.5 % — ABNORMAL HIGH (ref 11.5–15.5)
WBC: 5.3 10*3/uL (ref 4.0–10.5)
nRBC: 0 % (ref 0.0–0.2)

## 2018-04-19 LAB — FERRITIN: Ferritin: 877 ng/mL — ABNORMAL HIGH (ref 11–307)

## 2018-04-19 LAB — SAMPLE TO BLOOD BANK

## 2018-04-19 LAB — IRON AND TIBC
Iron: 290 ug/dL — ABNORMAL HIGH (ref 28–170)
Saturation Ratios: 87 % — ABNORMAL HIGH (ref 10.4–31.8)
TIBC: 332 ug/dL (ref 250–450)
UIBC: 42 ug/dL

## 2018-04-20 ENCOUNTER — Ambulatory Visit: Payer: Medicare Other | Admitting: Oncology

## 2018-04-20 ENCOUNTER — Ambulatory Visit: Payer: Medicare Other

## 2018-04-24 ENCOUNTER — Telehealth: Payer: Self-pay | Admitting: *Deleted

## 2018-04-24 NOTE — Telephone Encounter (Signed)
Patient reports that her port has not been flushed in quite a while. Please schedule appointment for this as necessary. It looks like perhaps November was the last flush

## 2018-04-26 ENCOUNTER — Inpatient Hospital Stay: Payer: Medicare Other | Attending: Oncology

## 2018-04-26 DIAGNOSIS — D46C Myelodysplastic syndrome with isolated del(5q) chromosomal abnormality: Secondary | ICD-10-CM | POA: Insufficient documentation

## 2018-04-26 DIAGNOSIS — Z95828 Presence of other vascular implants and grafts: Secondary | ICD-10-CM

## 2018-04-26 MED ORDER — SODIUM CHLORIDE 0.9% FLUSH
10.0000 mL | Freq: Once | INTRAVENOUS | Status: AC
  Administered 2018-04-26: 10 mL via INTRAVENOUS
  Filled 2018-04-26: qty 10

## 2018-04-26 MED ORDER — HEPARIN SOD (PORK) LOCK FLUSH 100 UNIT/ML IV SOLN
INTRAVENOUS | Status: AC
  Filled 2018-04-26: qty 5

## 2018-04-26 MED ORDER — HEPARIN SOD (PORK) LOCK FLUSH 100 UNIT/ML IV SOLN
500.0000 [IU] | Freq: Once | INTRAVENOUS | Status: AC
  Administered 2018-04-26: 500 [IU] via INTRAVENOUS

## 2018-05-02 ENCOUNTER — Other Ambulatory Visit: Payer: Self-pay | Admitting: *Deleted

## 2018-05-02 MED ORDER — HYDROCODONE-ACETAMINOPHEN 5-325 MG PO TABS: 1.0000 | ORAL_TABLET | ORAL | 0 refills | Status: DC | PRN

## 2018-05-16 ENCOUNTER — Other Ambulatory Visit: Payer: Self-pay | Admitting: *Deleted

## 2018-05-16 MED ORDER — ALPRAZOLAM 0.25 MG PO TABS: 0.2500 mg | ORAL_TABLET | Freq: Every day | ORAL | 0 refills | Status: DC | PRN

## 2018-05-16 NOTE — Telephone Encounter (Signed)
Patient called [facility] requesting refill of xanax.  As mandated by the [place] STOP Act (Strengthen Opioid Misuse Prevention), the [place] Controlled Substance Reporting System ([place]) was reviewed for this patient.  Below is the past 96-months of controlled substance prescriptions as displayed by the registry.  I have personally consulted with my supervising physician, Dr. Grayland Ormond, who agrees that continuation of therapy is medically appropriate at this time and agrees to provide continual monitoring, including urine/blood drug screens, as indicated. Prescription sent electronically using [employer] secure transmission to requested pharmacy.   [place] Reviewed and updated in PDMP review in epic.   Beckey Rutter, DNP, AGNP-C [facility] (work cell) [phone] (office) @TODAY @ 2:58 PM

## 2018-05-21 NOTE — Progress Notes (Signed)
[facility]  Telephone:([phone]) [phone] Fax:(336) [phone]  ID: Chaney Born OB: 14-Jun-1929  MR#: [id number]  NTI#:[id number]  Patient Care Team: [name], MD as PCP - General (Internal Medicine)  CHIEF COMPLAINT: MDS 5q-, CLL  INTERVAL HISTORY: Patient returns to clinic today for repeat laboratory and further evaluation.  She continues to have chronic weakness and fatigue which is relatively unchanged. She has no neurologic complaints. She denies any recent fevers or illnesses. She denies any night sweats or weight loss. She has no chest pain or shortness of breath. She denies any nausea, vomiting, constipation, or diarrhea. She denies any melena or hematochezia. She has no urinary complaints.  Patient offers no further specific complaints today.  REVIEW OF SYSTEMS:   Review of Systems  Constitutional: Positive for malaise/fatigue. Negative for fever and weight loss.  Respiratory: Negative.  Negative for cough and shortness of breath.   Cardiovascular: Negative.  Negative for chest pain and leg swelling.  Gastrointestinal: Negative.  Negative for abdominal pain, blood in stool and melena.  Genitourinary: Negative.  Negative for dysuria.  Musculoskeletal: Negative.  Negative for back pain and joint pain.  Skin: Negative.  Negative for rash.  Neurological: Positive for weakness. Negative for dizziness, sensory change, focal weakness and headaches.  Endo/Heme/Allergies: Does not bruise/bleed easily.  Psychiatric/Behavioral: Negative.  The patient is not nervous/anxious.     As per HPI. Otherwise, a complete review of systems is negative.  PAST MEDICAL HISTORY: Past Medical History:  Diagnosis Date  . Anemia    Myelodysplastic syndrome  . Arthritis   . Hypertension   . Leukemia, chronic lymphoid (HCC)    CLL  . Ovarian cancer ([place]) 1963    PAST SURGICAL HISTORY: Past Surgical History:  Procedure Laterality Date  . ABDOMINAL HYSTERECTOMY    .  APPENDECTOMY    . BREAST SURGERY     cyst removed from right breast  . EYE SURGERY Bilateral    Cataract Extractions  . IR FLUORO GUIDE CV LINE RIGHT  06/30/2017  . REVERSE SHOULDER ARTHROPLASTY Right 10/28/2014   Procedure: REVERSE SHOULDER ARTHROPLASTY;  Surgeon: Corky Mull, MD;  Location: [facility] ORS;  Service: Orthopedics;  Laterality: Right;    FAMILY HISTORY: Reviewed and unchanged. No reported history of malignancy or chronic disease.     ADVANCED DIRECTIVES:    HEALTH MAINTENANCE: Social History   Tobacco Use  . Smoking status: Former Smoker    Packs/day: 2.00    Types: Cigarettes  . Smokeless tobacco: Never Used  Substance Use Topics  . Alcohol use: Yes    Alcohol/week: 1.0 standard drinks    Types: 1 Glasses of wine per week    Comment: 1 glass of wine daily  . Drug use: No     Colonoscopy:  PAP:  Bone density:  Lipid panel:  No Known Allergies  Current Outpatient Medications  Medication Sig Dispense Refill  . ALPRAZolam (XANAX) 0.25 MG tablet Take 1 tablet (0.25 mg total) by mouth daily as needed for anxiety. 90 tablet 0  . amLODipine (NORVASC) 10 MG tablet     . aspirin 81 MG tablet Take 81 mg by mouth daily.    . cyanocobalamin (,VITAMIN B-12,) 1000 MCG/ML injection Inject into the muscle.    . diphenhydrAMINE (BENADRYL) 50 MG tablet Take 50 mg by mouth at bedtime as needed for itching.    . hydrochlorothiazide (HYDRODIURIL) 12.5 MG tablet Take 12.5 mg by mouth daily.     Marland Kitchen HYDROcodone-acetaminophen (NORCO/VICODIN) 5-325  MG tablet Take 1-2 tablets by mouth every 4 (four) hours as needed for moderate pain or severe pain. 60 tablet 0  . lidocaine-prilocaine (EMLA) cream Apply 1 application topically as needed. Apply to port 1-2 hours prior to appointment. Cover with plastic wrap. 30 g 0  . olmesartan (BENICAR) 20 MG tablet Take by mouth.    . SF 1.1 % GEL dental gel     . tiZANidine (ZANAFLEX) 2 MG tablet Take 1 tablet (2 mg total) by mouth at bedtime as  needed for muscle spasms. 30 tablet 0  . traMADol-acetaminophen (ULTRACET) 37.5-325 MG per tablet Take 1 tablet by mouth every 8 (eight) hours as needed.    . hydrALAZINE (APRESOLINE) 25 MG tablet Take by mouth.      No current facility-administered medications for this visit.     OBJECTIVE: Vitals:   05/25/18 0956  BP: (!) 164/80  Pulse: 81  Temp: 98.1 F (36.7 C)     Body mass index is 21.68 kg/m.    ECOG FS:0 - Asymptomatic  General: Thin, no acute distress. Eyes: Pink conjunctiva, anicteric sclera. HEENT: Normocephalic, moist mucous membranes, clear oropharnyx. Lungs: Clear to auscultation bilaterally. Heart: Regular rate and rhythm. No rubs, murmurs, or gallops. Abdomen: Soft, nontender, nondistended. No organomegaly noted, normoactive bowel sounds. Musculoskeletal: No edema, cyanosis, or clubbing. Neuro: Alert, answering all questions appropriately. Cranial nerves grossly intact. Skin: No rashes or petechiae noted. Psych: Normal affect.  LAB RESULTS:  Lab Results  Component Value Date   NA 130 (L) 03/16/2017   K 3.3 (L) 03/16/2017   CL 97 (L) 03/16/2017   CO2 22 03/16/2017   GLUCOSE 147 (H) 03/16/2017   BUN 19 03/16/2017   CREATININE 0.90 03/16/2017   CALCIUM 9.5 03/16/2017   PROT 7.3 03/27/2013   ALBUMIN 4.1 03/27/2013   AST 12 (L) 03/27/2013   ALT 20 03/27/2013   ALKPHOS 87 03/27/2013   BILITOT 0.5 03/27/2013   GFRNONAA 57 (L) 03/16/2017   GFRAA >60 03/16/2017    Lab Results  Component Value Date   WBC 4.4 05/24/2018   NEUTROABS 2.3 05/24/2018   HGB 8.4 (L) 05/24/2018   HCT 24.0 (L) 05/24/2018   MCV 114.8 (H) 05/24/2018   PLT 277 05/24/2018   Lab Results  Component Value Date   IRON 218 (H) 05/24/2018   TIBC 338 05/24/2018   IRONPCTSAT 65 (H) 05/24/2018   Lab Results  Component Value Date   FERRITIN 1,439 (H) 05/24/2018     STUDIES: No results found.  ASSESSMENT: MDS 5q-, CLL   PLAN:    1. MDS, 5q-: Bone marrow biopsy results from  June 30, 2017 reviewed independently confirming progression of patient's MDS, 5q-. She has a hypercellular marrow for age as well as an increased blast count up to 6%.  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  Patient continues to decline treatment with Revlimid.  Previously, we also discussed the possibility of progressing to AML, but patient wishes no further interventions other than periodic blood transfusions.  She declined transfusion today.  Patient's last blood transfusion was in November 2019.  She has required less frequent follow-up, therefore she will return to clinic in 2 months with repeat laboratory work, further evaluation, and consideration of additional blood.  Patient also requires Desferal with every infusion.    2.  Anemia: Repeat bone marrow biopsy as above.  Secondary to MDS.  Transfusion as above.   3.  Elevated iron stores: Chronic  and unchanged.  Desferal as above.   4.  CLL: Bone marrow biopsy did not mention any involvement of CLL.  White count continues to be within normal limits.  CT scan results from June 09, 2014 were independently reviewed and did not reveal any evidence of recurrent disease. All of her labwork is also either negative or within normal limits. Patient last received single agent Rituxan in February 2015.  5.  Anxiety: Continue Xanax as needed. 6.  Hypertension: Patient's blood pressure remains elevated today.  Unclear her compliance with her medications.  Continue follow-up and treatment per primary care. 7.  Back pain: Patient does not complain of this today. 8.  Abdominal bloating: MRI results from January 16, 2018 reviewed independently with no distinct etiology was noted, but incidentally a 1.7 cm cystic lesion suspicious for renal neoplasm was reported.  Continue follow-up with primary care.  Reimage in approximately October 2020.  Patient expressed understanding and was in agreement with this plan. She also  understands that She can call clinic at any time with any questions, concerns, or complaints.   Lloyd Huger, MD   05/26/2018 3:30 PM

## 2018-05-23 ENCOUNTER — Other Ambulatory Visit: Payer: Medicare Other

## 2018-05-24 ENCOUNTER — Ambulatory Visit: Payer: Medicare Other | Admitting: Oncology

## 2018-05-24 ENCOUNTER — Telehealth: Payer: Self-pay

## 2018-05-24 ENCOUNTER — Ambulatory Visit: Payer: Medicare Other

## 2018-05-24 ENCOUNTER — Other Ambulatory Visit: Payer: Self-pay

## 2018-05-24 ENCOUNTER — Inpatient Hospital Stay: Payer: Medicare Other | Attending: Oncology

## 2018-05-24 DIAGNOSIS — M1990 Unspecified osteoarthritis, unspecified site: Secondary | ICD-10-CM | POA: Diagnosis not present

## 2018-05-24 DIAGNOSIS — Z8543 Personal history of malignant neoplasm of ovary: Secondary | ICD-10-CM | POA: Diagnosis not present

## 2018-05-24 DIAGNOSIS — D46C Myelodysplastic syndrome with isolated del(5q) chromosomal abnormality: Secondary | ICD-10-CM

## 2018-05-24 DIAGNOSIS — Z79899 Other long term (current) drug therapy: Secondary | ICD-10-CM | POA: Diagnosis not present

## 2018-05-24 DIAGNOSIS — Z87891 Personal history of nicotine dependence: Secondary | ICD-10-CM | POA: Insufficient documentation

## 2018-05-24 DIAGNOSIS — R5382 Chronic fatigue, unspecified: Secondary | ICD-10-CM | POA: Insufficient documentation

## 2018-05-24 DIAGNOSIS — R5381 Other malaise: Secondary | ICD-10-CM | POA: Insufficient documentation

## 2018-05-24 DIAGNOSIS — I10 Essential (primary) hypertension: Secondary | ICD-10-CM | POA: Insufficient documentation

## 2018-05-24 DIAGNOSIS — R531 Weakness: Secondary | ICD-10-CM | POA: Diagnosis not present

## 2018-05-24 DIAGNOSIS — Z7982 Long term (current) use of aspirin: Secondary | ICD-10-CM | POA: Diagnosis not present

## 2018-05-24 LAB — CBC WITH DIFFERENTIAL/PLATELET
Abs Immature Granulocytes: 0.02 10*3/uL (ref 0.00–0.07)
Basophils Absolute: 0.1 10*3/uL (ref 0.0–0.1)
Basophils Relative: 2 %
Eosinophils Absolute: 0.2 10*3/uL (ref 0.0–0.5)
Eosinophils Relative: 3 %
HCT: 24 % — ABNORMAL LOW (ref 36.0–46.0)
Hemoglobin: 8.4 g/dL — ABNORMAL LOW (ref 12.0–15.0)
Immature Granulocytes: 1 %
Lymphocytes Relative: 27 %
Lymphs Abs: 1.2 10*3/uL (ref 0.7–4.0)
MCH: 40.2 pg — ABNORMAL HIGH (ref 26.0–34.0)
MCHC: 35 g/dL (ref 30.0–36.0)
MCV: 114.8 fL — ABNORMAL HIGH (ref 80.0–100.0)
Monocytes Absolute: 0.6 10*3/uL (ref 0.1–1.0)
Monocytes Relative: 14 %
Neutro Abs: 2.3 10*3/uL (ref 1.7–7.7)
Neutrophils Relative %: 53 %
Platelets: 277 10*3/uL (ref 150–400)
RBC: 2.09 MIL/uL — ABNORMAL LOW (ref 3.87–5.11)
RDW: 15.1 % (ref 11.5–15.5)
WBC: 4.4 10*3/uL (ref 4.0–10.5)
nRBC: 0 % (ref 0.0–0.2)

## 2018-05-24 LAB — SAMPLE TO BLOOD BANK

## 2018-05-24 LAB — IRON AND TIBC
Iron: 218 ug/dL — ABNORMAL HIGH (ref 28–170)
Saturation Ratios: 65 % — ABNORMAL HIGH (ref 10.4–31.8)
TIBC: 338 ug/dL (ref 250–450)
UIBC: 120 ug/dL

## 2018-05-24 LAB — FERRITIN: Ferritin: 1439 ng/mL — ABNORMAL HIGH (ref 11–307)

## 2018-05-24 NOTE — Telephone Encounter (Signed)
Called patient to ask her if she was coming in tomorrow since her Hbg was 8.5. Patient stated that she usually doesn't come in but that tomorrow she will come in since she had not seen Dr. Grayland Ormond in four months. I reminded her of her appointment at 10:00 AM.

## 2018-05-25 ENCOUNTER — Inpatient Hospital Stay (HOSPITAL_BASED_OUTPATIENT_CLINIC_OR_DEPARTMENT_OTHER): Payer: Medicare Other | Admitting: Oncology

## 2018-05-25 ENCOUNTER — Encounter: Payer: Self-pay | Admitting: Oncology

## 2018-05-25 ENCOUNTER — Other Ambulatory Visit: Payer: Self-pay

## 2018-05-25 ENCOUNTER — Inpatient Hospital Stay: Payer: Medicare Other

## 2018-05-25 VITALS — BP 164/80 | HR 81 | Temp 98.1°F | Wt 111.0 lb

## 2018-05-25 DIAGNOSIS — M1990 Unspecified osteoarthritis, unspecified site: Secondary | ICD-10-CM

## 2018-05-25 DIAGNOSIS — R5382 Chronic fatigue, unspecified: Secondary | ICD-10-CM | POA: Diagnosis not present

## 2018-05-25 DIAGNOSIS — D46C Myelodysplastic syndrome with isolated del(5q) chromosomal abnormality: Secondary | ICD-10-CM

## 2018-05-25 DIAGNOSIS — R5381 Other malaise: Secondary | ICD-10-CM

## 2018-05-25 DIAGNOSIS — I10 Essential (primary) hypertension: Secondary | ICD-10-CM

## 2018-05-25 DIAGNOSIS — Z79899 Other long term (current) drug therapy: Secondary | ICD-10-CM

## 2018-05-25 DIAGNOSIS — D649 Anemia, unspecified: Secondary | ICD-10-CM

## 2018-05-25 DIAGNOSIS — R531 Weakness: Secondary | ICD-10-CM

## 2018-05-25 DIAGNOSIS — Z87891 Personal history of nicotine dependence: Secondary | ICD-10-CM

## 2018-05-25 DIAGNOSIS — Z8543 Personal history of malignant neoplasm of ovary: Secondary | ICD-10-CM

## 2018-05-25 DIAGNOSIS — Z7982 Long term (current) use of aspirin: Secondary | ICD-10-CM

## 2018-05-25 NOTE — Progress Notes (Signed)
Patient denies any concerns today.  

## 2018-05-29 ENCOUNTER — Other Ambulatory Visit (INDEPENDENT_AMBULATORY_CARE_PROVIDER_SITE_OTHER): Payer: Self-pay | Admitting: Nurse Practitioner

## 2018-05-29 ENCOUNTER — Ambulatory Visit (INDEPENDENT_AMBULATORY_CARE_PROVIDER_SITE_OTHER): Payer: Medicare Other

## 2018-05-29 ENCOUNTER — Ambulatory Visit (INDEPENDENT_AMBULATORY_CARE_PROVIDER_SITE_OTHER): Payer: Medicare Other | Admitting: Nurse Practitioner

## 2018-05-29 ENCOUNTER — Encounter (INDEPENDENT_AMBULATORY_CARE_PROVIDER_SITE_OTHER): Payer: Self-pay | Admitting: Nurse Practitioner

## 2018-05-29 ENCOUNTER — Other Ambulatory Visit: Payer: Self-pay

## 2018-05-29 VITALS — BP 208/78 | HR 86 | Resp 10 | Ht 59.0 in | Wt 112.0 lb

## 2018-05-29 DIAGNOSIS — Z87891 Personal history of nicotine dependence: Secondary | ICD-10-CM | POA: Diagnosis not present

## 2018-05-29 DIAGNOSIS — E119 Type 2 diabetes mellitus without complications: Secondary | ICD-10-CM | POA: Diagnosis not present

## 2018-05-29 DIAGNOSIS — Z7902 Long term (current) use of antithrombotics/antiplatelets: Secondary | ICD-10-CM

## 2018-05-29 DIAGNOSIS — Z79899 Other long term (current) drug therapy: Secondary | ICD-10-CM

## 2018-05-29 DIAGNOSIS — I6523 Occlusion and stenosis of bilateral carotid arteries: Secondary | ICD-10-CM

## 2018-05-29 DIAGNOSIS — I10 Essential (primary) hypertension: Secondary | ICD-10-CM | POA: Diagnosis not present

## 2018-05-29 DIAGNOSIS — Z7982 Long term (current) use of aspirin: Secondary | ICD-10-CM

## 2018-05-30 ENCOUNTER — Other Ambulatory Visit: Payer: Self-pay | Admitting: *Deleted

## 2018-05-30 ENCOUNTER — Encounter (INDEPENDENT_AMBULATORY_CARE_PROVIDER_SITE_OTHER): Payer: Self-pay | Admitting: Nurse Practitioner

## 2018-05-30 MED ORDER — HYDROCODONE-ACETAMINOPHEN 5-325 MG PO TABS: 1.0000 | ORAL_TABLET | ORAL | 0 refills | Status: DC | PRN

## 2018-05-30 NOTE — Progress Notes (Signed)
SUBJECTIVE:  Patient ID: Jacqueline Russo, female    DOB: Sep 10, 1929, 85 y.o.   MRN: [id number] Chief Complaint  Patient presents with  . Follow-up    HPI  Jacqueline Russo is a 85 y.o. female The patient is seen for follow up evaluation of carotid stenosis. The carotid stenosis followed by ultrasound.   The patient denies amaurosis fugax. There is no recent history of TIA symptoms or focal motor deficits. There is no prior documented CVA.  The patient is taking enteric-coated aspirin 81 mg daily.  There is no history of migraine headaches. There is no history of seizures.  The patient has a history of coronary artery disease, no recent episodes of angina or shortness of breath. The patient denies PAD or claudication symptoms. There is a history of hyperlipidemia which is being treated with a statin.    Carotid Duplex done today shows 40-59%.  No change compared to last study in 11/28/2017.  Past Medical History:  Diagnosis Date  . Anemia    Myelodysplastic syndrome  . Arthritis   . Hypertension   . Leukemia, chronic lymphoid (HCC)    CLL  . Ovarian cancer ([place]) 1963    Past Surgical History:  Procedure Laterality Date  . ABDOMINAL HYSTERECTOMY    . APPENDECTOMY    . BREAST SURGERY     cyst removed from right breast  . EYE SURGERY Bilateral    Cataract Extractions  . IR FLUORO GUIDE CV LINE RIGHT  06/30/2017  . REVERSE SHOULDER ARTHROPLASTY Right 10/28/2014   Procedure: REVERSE SHOULDER ARTHROPLASTY;  Surgeon: Corky Mull, MD;  Location: [facility] ORS;  Service: Orthopedics;  Laterality: Right;    Social History   Socioeconomic History  . Marital status: Widowed    Spouse name: Not on file  . Number of children: Not on file  . Years of education: Not on file  . Highest education level: Not on file  Occupational History  . Not on file  Social Needs  . Financial resource strain: Not on file  . Food insecurity:    Worry: Not on file    Inability: Not on file  .  Transportation needs:    Medical: Not on file    Non-medical: Not on file  Tobacco Use  . Smoking status: Former Smoker    Packs/day: 2.00    Types: Cigarettes    Last attempt to quit: 1963    Years since quitting: 57.2  . Smokeless tobacco: Never Used  Substance and Sexual Activity  . Alcohol use: Yes    Alcohol/week: 1.0 standard drinks    Types: 1 Glasses of wine per week    Comment: 1 glass of wine daily  . Drug use: No  . Sexual activity: Not on file  Lifestyle  . Physical activity:    Days per week: Not on file    Minutes per session: Not on file  . Stress: Not on file  Relationships  . Social connections:    Talks on phone: Not on file    Gets together: Not on file    Attends religious service: Not on file    Active member of club or organization: Not on file    Attends meetings of clubs or organizations: Not on file    Relationship status: Not on file  . Intimate partner violence:    Fear of current or ex partner: Not on file    Emotionally abused: Not on file    Physically abused: Not  on file    Forced sexual activity: Not on file  Other Topics Concern  . Not on file  Social History Narrative  . Not on file    History reviewed. No pertinent family history.  No Known Allergies   Review of Systems   Review of Systems: Negative Unless Checked Constitutional: [] Weight loss  [] Fever  [] Chills Cardiac: [] Chest pain   []  Atrial Fibrillation  [] Palpitations   [] Shortness of breath when laying flat   [] Shortness of breath with exertion. [] Shortness of breath at rest Vascular:  [] Pain in legs with walking   [] Pain in legs with standing [] Pain in legs when laying flat   [] Claudication    [] Pain in feet when laying flat    [] History of DVT   [] Phlebitis   [] Swelling in legs   [] Varicose veins   [] Non-healing ulcers Pulmonary:   [] Uses home oxygen   [] Productive cough   [] Hemoptysis   [] Wheeze  [] COPD   [] Asthma Neurologic:  [] Dizziness   [] Seizures  [] Blackouts  [] History of stroke   [] History of TIA  [] Aphasia   [] Temporary Blindness   [] Weakness or numbness in arm   [] Weakness or numbness in leg Musculoskeletal:   [x] Joint swelling   [] Joint pain   [] Low back pain  []  History of Knee Replacement [x] Arthritis [] back Surgeries  []  Spinal Stenosis    Hematologic:  [] Easy bruising  [] Easy bleeding   [] Hypercoagulable state   [] Anemic Gastrointestinal:  [] Diarrhea   [] Vomiting  [] Gastroesophageal reflux/heartburn   [] Difficulty swallowing. [] Abdominal pain Genitourinary:  [x] Chronic kidney disease   [] Difficult urination  [] Anuric   [] Blood in urine [] Frequent urination  [] Burning with urination   [] Hematuria Skin:  [] Rashes   [] Ulcers [] Wounds Psychological:  [] History of anxiety   []  History of major depression  []  Memory Difficulties      OBJECTIVE:   Physical Exam  BP (!) 208/78 (BP Location: Left Arm, Patient Position: Sitting, Cuff Size: Small)   Pulse 86   Resp 10   Ht 4\' 11"  (1.499 m)   Wt 112 lb (50.8 kg)   BMI 22.62 kg/m   Gen: WD/WN, NAD Head: [place]/AT, No temporalis wasting.  Ear/Nose/Throat: Hearing grossly intact, nares w/o erythema or drainage Eyes: PER, EOMI, sclera nonicteric.  Neck: Supple, no masses.  No JVD.  Pulmonary:  Good air movement, no use of accessory muscles.  Cardiac: RRR Vascular:  Vessel Right Left  Radial Palpable Palpable   Gastrointestinal: soft, non-distended. No guarding/no peritoneal signs.  Musculoskeletal: M/S 5/5 throughout.  No deformity or atrophy.  Neurologic: Pain and light touch intact in extremities.  Symmetrical.  Speech is fluent. Motor exam as listed above. Psychiatric: Judgment intact, Mood & affect appropriate for pt's clinical situation. Dermatologic: No Venous rashes. No Ulcers Noted.  No changes consistent with cellulitis. Lymph : No Cervical lymphadenopathy, no lichenification or skin changes of chronic lymphedema.       ASSESSMENT AND PLAN:  1. Bilateral carotid artery stenosis  Recommend:  Given the patient's asymptomatic subcritical stenosis no further invasive testing or surgery at this time.  Duplex ultrasound shows 40-59% stenosis bilaterally.  Continue antiplatelet therapy as prescribed Continue management of CAD, HTN and Hyperlipidemia Healthy heart diet,  encouraged exercise at least 4 times per week Follow up in 12 months with duplex ultrasound and physical exam   2. Essential hypertension Continue antihypertensive medications as already ordered, these medications have been reviewed and there are no changes at this time.   3. Diet-controlled type 2  diabetes mellitus ([place]) Continue hypoglycemic medications as already ordered, these medications have been reviewed and there are no changes at this time.  Hgb A1C to be monitored as already arranged by primary service    Current Outpatient Medications on File Prior to Visit  Medication Sig Dispense Refill  . ALPRAZolam (XANAX) 0.25 MG tablet Take 1 tablet (0.25 mg total) by mouth daily as needed for anxiety. 90 tablet 0  . amLODipine (NORVASC) 10 MG tablet     . aspirin 81 MG tablet Take 81 mg by mouth daily.    . cyanocobalamin (,VITAMIN B-12,) 1000 MCG/ML injection Inject into the muscle.    . diphenhydrAMINE (BENADRYL) 50 MG tablet Take 50 mg by mouth at bedtime as needed for itching.    . hydrALAZINE (APRESOLINE) 25 MG tablet Take by mouth.     . hydrochlorothiazide (HYDRODIURIL) 12.5 MG tablet Take 12.5 mg by mouth daily.     Marland Kitchen HYDROcodone-acetaminophen (NORCO/VICODIN) 5-325 MG tablet Take 1-2 tablets by mouth every 4 (four) hours as needed for moderate pain or severe pain. 60 tablet 0  . lidocaine-prilocaine (EMLA) cream Apply 1 application topically as needed. Apply to port 1-2 hours prior to appointment. Cover with plastic wrap. 30 g 0  . olmesartan (BENICAR) 20 MG tablet Take by mouth.    . traMADol-acetaminophen (ULTRACET) 37.5-325 MG per tablet Take 1 tablet by mouth every 8 (eight) hours as  needed.    . SF 1.1 % GEL dental gel     . tiZANidine (ZANAFLEX) 2 MG tablet Take 1 tablet (2 mg total) by mouth at bedtime as needed for muscle spasms. (Patient not taking: Reported on 05/29/2018) 30 tablet 0   No current facility-administered medications on file prior to visit.     There are no Patient Instructions on file for this visit. No follow-ups on file.   Kris Hartmann, NP  This note was completed with Sales executive.  Any errors are purely unintentional.

## 2018-06-26 ENCOUNTER — Other Ambulatory Visit: Payer: Self-pay | Admitting: *Deleted

## 2018-06-26 MED ORDER — HYDROCODONE-ACETAMINOPHEN 5-325 MG PO TABS: 1.0000 | ORAL_TABLET | ORAL | 0 refills | Status: DC | PRN

## 2018-07-04 DIAGNOSIS — M1A00X0 Idiopathic chronic gout, unspecified site, without tophus (tophi): Secondary | ICD-10-CM | POA: Insufficient documentation

## 2018-07-19 ENCOUNTER — Other Ambulatory Visit: Payer: Self-pay | Admitting: *Deleted

## 2018-07-19 MED ORDER — HYDROCODONE-ACETAMINOPHEN 5-325 MG PO TABS: 1.0000 | ORAL_TABLET | ORAL | 0 refills | Status: DC | PRN

## 2018-07-24 ENCOUNTER — Inpatient Hospital Stay: Payer: Medicare Other

## 2018-07-24 ENCOUNTER — Telehealth: Payer: Self-pay | Admitting: *Deleted

## 2018-07-24 ENCOUNTER — Other Ambulatory Visit: Payer: Self-pay

## 2018-07-24 ENCOUNTER — Inpatient Hospital Stay: Payer: Medicare Other | Attending: Hematology and Oncology

## 2018-07-24 ENCOUNTER — Other Ambulatory Visit: Payer: Self-pay | Admitting: *Deleted

## 2018-07-24 VITALS — Temp 98.3°F

## 2018-07-24 DIAGNOSIS — Z95828 Presence of other vascular implants and grafts: Secondary | ICD-10-CM

## 2018-07-24 DIAGNOSIS — D46C Myelodysplastic syndrome with isolated del(5q) chromosomal abnormality: Secondary | ICD-10-CM | POA: Insufficient documentation

## 2018-07-24 LAB — CBC WITH DIFFERENTIAL/PLATELET
Abs Immature Granulocytes: 0.09 10*3/uL — ABNORMAL HIGH (ref 0.00–0.07)
Basophils Absolute: 0.1 10*3/uL (ref 0.0–0.1)
Basophils Relative: 1 %
Eosinophils Absolute: 0.2 10*3/uL (ref 0.0–0.5)
Eosinophils Relative: 3 %
HCT: 25.7 % — ABNORMAL LOW (ref 36.0–46.0)
Hemoglobin: 8.6 g/dL — ABNORMAL LOW (ref 12.0–15.0)
Immature Granulocytes: 1 %
Lymphocytes Relative: 6 %
Lymphs Abs: 0.5 10*3/uL — ABNORMAL LOW (ref 0.7–4.0)
MCH: 40.6 pg — ABNORMAL HIGH (ref 26.0–34.0)
MCHC: 33.5 g/dL (ref 30.0–36.0)
MCV: 121.2 fL — ABNORMAL HIGH (ref 80.0–100.0)
Monocytes Absolute: 0.5 10*3/uL (ref 0.1–1.0)
Monocytes Relative: 6 %
Neutro Abs: 6.7 10*3/uL (ref 1.7–7.7)
Neutrophils Relative %: 83 %
Platelets: 236 10*3/uL (ref 150–400)
RBC: 2.12 MIL/uL — ABNORMAL LOW (ref 3.87–5.11)
RDW: 15.3 % (ref 11.5–15.5)
WBC: 8 10*3/uL (ref 4.0–10.5)
nRBC: 0 % (ref 0.0–0.2)

## 2018-07-24 LAB — SAMPLE TO BLOOD BANK

## 2018-07-24 MED ORDER — HEPARIN SOD (PORK) LOCK FLUSH 100 UNIT/ML IV SOLN
500.0000 [IU] | Freq: Once | INTRAVENOUS | Status: AC
  Administered 2018-07-24: 500 [IU] via INTRAVENOUS

## 2018-07-24 MED ORDER — SODIUM CHLORIDE 0.9% FLUSH
10.0000 mL | INTRAVENOUS | Status: DC | PRN
  Administered 2018-07-24: 10 mL via INTRAVENOUS
  Filled 2018-07-24: qty 10

## 2018-07-24 NOTE — Telephone Encounter (Signed)
3 months is fine.

## 2018-07-24 NOTE — Telephone Encounter (Signed)
Per Dr. Grayland Ormond patient does not need to keep appointment for follow up and transfusion tomorrow. Patient to be rescheduled in 2 months. Appointments have been cancelled and schedule message sent. I spoke with the [facility] and they will relay message about cancellation for tomorrow to patient.

## 2018-07-24 NOTE — Telephone Encounter (Signed)
Patient called asking for todays results and to schedule he next appointment with Dr Grayland Ormond. She also asks if she can wait 3 months before having her labs checked again.  CBC with Differential/Platelet  Order: [id number]  Status:  Final result  Visible to patient:  No (Not Released)  Next appt:  08/03/2018 at 01:00 PM in Radiology ([facility])  Dx:  MDS (myelodysplastic syndrome) with 5...   Ref Range & Units 09:34 70mo ago 83mo ago 19mo ago 48mo ago 24mo ago 70mo ago  WBC 4.0 - 10.5 K/uL 8.0  4.4  5.3  3.7Low   4.7  3.9Low   3.3Low  R  RBC 3.87 - 5.11 MIL/uL 2.12Low   2.09Low   2.15Low   2.20Low   2.24Low   1.98Low   2.34Low  R  Hemoglobin 12.0 - 15.0 g/dL 8.6Low   8.4Low   8.5Low   8.6Low   8.6Low   7.9Low   8.9Low  R  HCT 36.0 - 46.0 % 25.7Low   24.0Low   25.8Low   25.5Low   25.1Low   22.7Low   26.3Low  R  MCV 80.0 - 100.0 fL 121.2High   114.8High   120.0High   115.9High   112.1High   114.6High   112.4High    MCH 26.0 - 34.0 pg 40.6High   40.2High   39.5High   39.1High   38.4High   39.9High   38.1High    MCHC 30.0 - 36.0 g/dL 33.5  35.0  32.9  33.7  34.3  34.8  33.9 R  RDW 11.5 - 15.5 % 15.3  15.1  16.5High   17.6High   20.4High   18.0High   20.8High  R  Platelets 150 - 400 K/uL 236  277  329  399  308  308  275 R  nRBC 0.0 - 0.2 % 0.0  0.0  0.0  0.0  0.0  0.0    Neutrophils Relative % % 83  53  64  50  64  58  54   Neutro Abs 1.7 - 7.7 K/uL 6.7  2.3  3.5  1.9  3.0  2.3  1.8 R  Lymphocytes Relative % 6  27  20  31  19  22  27    Lymphs Abs 0.7 - 4.0 K/uL 0.5Low   1.2  1.0  1.2  0.9  0.9  0.9Low  R  Monocytes Relative % 6  14  8  11  10  13  11    Monocytes Absolute 0.1 - 1.0 K/uL 0.5  0.6  0.4  0.4  0.5  0.5  0.4 R  Eosinophils Relative % 3  3  3  5  3  4  4    Eosinophils Absolute 0.0 - 0.5 K/uL 0.2  0.2  0.1  0.2  0.1  0.2  0.1 R  Basophils Relative % 1  2  3  2  2  2  4    Basophils Absolute 0.0 - 0.1 K/uL 0.1  0.1  0.1  0.1  0.1  0.1  0.1 R, CM  Immature Granulocytes % 1  1  2  1  2  1      Abs Immature Granulocytes 0.00 - 0.07 K/uL 0.09High   0.02 CM 0.11High  CM 0.02 CM 0.07 CM 0.04 CM   Comment: Performed at [facility], [address] [place], [place] [address]  Resulting Agency  [facility] CL[facility] LAB [place] [facility] LAB [place] [facility] [place]  LAB [place] [facility] [place] [facility] [place] [facility]      Specimen Collected: 07/24/18 09:34  Last Resulted: 07/24/18 10:07

## 2018-07-25 ENCOUNTER — Inpatient Hospital Stay: Payer: Medicare Other

## 2018-07-25 ENCOUNTER — Inpatient Hospital Stay: Payer: Medicare Other | Admitting: Oncology

## 2018-08-03 ENCOUNTER — Other Ambulatory Visit: Payer: Self-pay

## 2018-08-03 ENCOUNTER — Ambulatory Visit
Admission: RE | Admit: 2018-08-03 | Discharge: 2018-08-03 | Disposition: A | Discharge status: Home / Self Care | Payer: Medicare Other | Source: Ambulatory Visit | Attending: Urology | Admitting: Urology

## 2018-08-03 DIAGNOSIS — N2889 Other specified disorders of kidney and ureter: Secondary | ICD-10-CM | POA: Diagnosis not present

## 2018-08-08 ENCOUNTER — Telehealth (INDEPENDENT_AMBULATORY_CARE_PROVIDER_SITE_OTHER): Payer: Medicare Other | Admitting: Urology

## 2018-08-08 ENCOUNTER — Telehealth: Payer: Medicare Other | Admitting: Urology

## 2018-08-08 ENCOUNTER — Other Ambulatory Visit: Payer: Self-pay

## 2018-08-08 DIAGNOSIS — N2889 Other specified disorders of kidney and ureter: Secondary | ICD-10-CM | POA: Diagnosis not present

## 2018-08-08 NOTE — Progress Notes (Signed)
Virtual Visit via Telephone Note  I connected with Jacqueline Russo on 08/08/18 at 11:30 AM EDT by telephone and verified that I am speaking with the correct person using two identifiers.   I discussed the limitations, risks, security and privacy concerns of performing an evaluation and management service by telephone and the availability of in person appointments. We discussed the impact of the COVID-19 on the healthcare system, and the importance of social distancing and reducing patient and provider exposure. I also discussed with the patient that there may be a patient responsible charge related to this service. The patient expressed understanding and agreed to proceed.  Reason for visit: Right renal mass on active surveillance  History of Present Illness: I had a phone conversation with [name] today regarding her right-sided renal mass on active surveillance.  To briefly summarize, she is an 85 year old female with CLL/MDS and distant history of ovarian cancer in her 59s who was found to have a 1.7 cm enhancing lesion in the right upper kidney on CT and MRI in October 2019 worrisome for possible malignancy.  There is no evidence of metastatic disease.  She remains asymptomatic with no flank pain, weight loss, or hematuria.  Overall she is feeling very well.  We elected for active surveillance with a renal ultrasound.  Ultrasound performed 08/03/2018 showed possible slight increase of approximately 0.5cm of the mass, however with different imaging modalities difficult to compare.  She is very adamant that she does not want to undergo biopsy or intervention unless absolutely necessary, which is certainly very reasonable with her age and comorbidities.  Assessment and Plan: In summary, the patient is an 85 year old female with CLL/MDS and distant history of ovarian cancer with a relatively stable approximately 2 cm right renal lesion worrisome for possible malignancy.  We discussed without biopsy,  we cannot differentiate malignancy from a benign tumor or cyst.  We again discussed treatment options including ongoing active surveillance with repeat imaging, renal biopsy, or minimally invasive treatment options including partial nephrectomy or percutaneous ablation.  She would like to continue active surveillance, which is very reasonable.  Follow Up: RTC 12/2018 for repeat cross-sectional imaging with MRI If lesion stable at that time, consider spacing imaging to yearly intervals   I discussed the assessment and treatment plan with the patient. The patient was provided an opportunity to ask questions and all were answered. The patient agreed with the plan and demonstrated an understanding of the instructions.   The patient was advised to call back or seek an in-person evaluation if the symptoms worsen or if the condition fails to improve as anticipated.  I provided 15 minutes of non-face-to-face time during this encounter.   Billey Co, MD

## 2018-08-16 ENCOUNTER — Other Ambulatory Visit: Payer: Self-pay | Admitting: *Deleted

## 2018-08-16 MED ORDER — ALPRAZOLAM 0.25 MG PO TABS: 0.2500 mg | ORAL_TABLET | Freq: Every day | ORAL | 0 refills | Status: DC | PRN

## 2018-08-24 ENCOUNTER — Other Ambulatory Visit: Payer: Self-pay | Admitting: *Deleted

## 2018-08-24 MED ORDER — HYDROCODONE-ACETAMINOPHEN 5-325 MG PO TABS: 1.0000 | ORAL_TABLET | ORAL | 0 refills | Status: DC | PRN

## 2018-08-24 NOTE — Telephone Encounter (Signed)
Patient called Jacqueline Russo requesting refill of norco.   As mandated by the [place] STOP Act (Strengthen Opioid Misuse Prevention), the [place] Controlled Substance Reporting System ([place]) was reviewed for this patient.  Below is the past 22-months of controlled substance prescriptions as displayed by the registry.  I have personally consulted with my supervising physician, Dr. Grayland Ormond, who agrees that continuation of opiate therapy is medically appropriate at this time and agrees to provide continual monitoring, including urine/blood drug screens, as indicated. Prescription sent electronically using [employer] secure transmission to requested pharmacy.   [place] Reviewed & PDMP reviewed in Epic.    Beckey Rutter, DNP, AGNP-C [place] at [facility] [phone] (work cell) [phone] (office)

## 2018-08-30 ENCOUNTER — Telehealth: Payer: Self-pay | Admitting: *Deleted

## 2018-08-30 NOTE — Telephone Encounter (Signed)
Patient called reporting that she had an appointment with her PCP today he told to to speak with Dr Grayland Ormond. She did not say why and Dr Sanjuan Dame note is not completed [phone]

## 2018-08-30 NOTE — Telephone Encounter (Signed)
Called patient back and she stated that she said her PCP-Dr. Sabra Heck today and that they went over her lab results. Dr. Sabra Heck told patient to call us to let us know that she needed blood transfusion. I told patient that I would let Dr. Grayland Ormond know and that I would call her once he reviewed her lab results. Patient agreed and had no further questions.

## 2018-09-02 NOTE — Progress Notes (Signed)
[place]  Telephone:([phone]) [phone] [phone]) [phone]  ID: Jacqueline Russo OB: March 17, 1930  MR#: [id number]  GYK#:[id number]  Patient Care Team: [name], MD as PCP - General (Internal Medicine)  CHIEF COMPLAINT: MDS 5q-, CLL  INTERVAL HISTORY: Patient returns to clinic today for further evaluation and consideration of red blood cell transfusion.  Her chronic weakness and fatigue has been worse over the past several weeks.  She otherwise feels well.  She has no neurologic complaints. She denies any recent fevers or illnesses. She denies any night sweats or weight loss.  She denies any chest pain, shortness of breath, cough, or hemoptysis.  She denies any nausea, vomiting, constipation, or diarrhea. She denies any melena or hematochezia. She has no urinary complaints.  Patient offers no further specific complaints today.  REVIEW OF SYSTEMS:   Review of Systems  Constitutional: Positive for malaise/fatigue. Negative for fever and weight loss.  Respiratory: Negative.  Negative for cough and shortness of breath.   Cardiovascular: Negative.  Negative for chest pain and leg swelling.  Gastrointestinal: Negative.  Negative for abdominal pain, blood in stool and melena.  Genitourinary: Negative.  Negative for dysuria.  Musculoskeletal: Negative.  Negative for back pain and joint pain.  Skin: Negative.  Negative for rash.  Neurological: Positive for weakness. Negative for dizziness, sensory change, focal weakness and headaches.  Endo/Heme/Allergies: Does not bruise/bleed easily.  Psychiatric/Behavioral: Negative.  The patient is not nervous/anxious.     As per HPI. Otherwise, a complete review of systems is negative.  PAST MEDICAL HISTORY: Past Medical History:  Diagnosis Date  . Anemia    Myelodysplastic syndrome  . Arthritis   . Hypertension   . Leukemia, chronic lymphoid (HCC)    CLL  . Ovarian cancer ([place]) 1963    PAST SURGICAL HISTORY: Past Surgical  History:  Procedure Laterality Date  . ABDOMINAL HYSTERECTOMY    . APPENDECTOMY    . BREAST SURGERY     cyst removed from right breast  . EYE SURGERY Bilateral    Cataract Extractions  . IR FLUORO GUIDE CV LINE RIGHT  06/30/2017  . REVERSE SHOULDER ARTHROPLASTY Right 10/28/2014   Procedure: REVERSE SHOULDER ARTHROPLASTY;  Surgeon: Corky Mull, MD;  Location: [facility] ORS;  Service: Orthopedics;  Laterality: Right;    FAMILY HISTORY: Reviewed and unchanged. No reported history of malignancy or chronic disease.     ADVANCED DIRECTIVES:    HEALTH MAINTENANCE: Social History   Tobacco Use  . Smoking status: Former Smoker    Packs/day: 2.00    Types: Cigarettes    Quit date: 1963    Years since quitting: 57.5  . Smokeless tobacco: Never Used  Substance Use Topics  . Alcohol use: Yes    Alcohol/week: 1.0 standard drinks    Types: 1 Glasses of wine per week    Comment: 1 glass of wine daily  . Drug use: No     Colonoscopy:  PAP:  Bone density:  Lipid panel:  No Known Allergies  Current Outpatient Medications  Medication Sig Dispense Refill  . ALPRAZolam (XANAX) 0.25 MG tablet Take 1 tablet (0.25 mg total) by mouth daily as needed for anxiety. 90 tablet 0  . amLODipine (NORVASC) 10 MG tablet     . aspirin 81 MG tablet Take 81 mg by mouth daily.    . cyanocobalamin (,VITAMIN B-12,) 1000 MCG/ML injection Inject into the muscle.    . diphenhydrAMINE (BENADRYL) 50 MG tablet Take 50 mg by mouth at  bedtime as needed for itching.    . hydrochlorothiazide (HYDRODIURIL) 12.5 MG tablet Take 12.5 mg by mouth daily.     Marland Kitchen HYDROcodone-acetaminophen (NORCO/VICODIN) 5-325 MG tablet Take 1-2 tablets by mouth every 4 (four) hours as needed for moderate pain or severe pain. 90 tablet 0  . lidocaine-prilocaine (EMLA) cream Apply 1 application topically as needed. Apply to port 1-2 hours prior to appointment. Cover with plastic wrap. 30 g 0  . SF 1.1 % GEL dental gel     . tiZANidine (ZANAFLEX)  2 MG tablet Take 1 tablet (2 mg total) by mouth at bedtime as needed for muscle spasms. 30 tablet 0  . traMADol-acetaminophen (ULTRACET) 37.5-325 MG per tablet Take 1 tablet by mouth every 8 (eight) hours as needed.    . hydrALAZINE (APRESOLINE) 25 MG tablet Take by mouth.     . olmesartan (BENICAR) 20 MG tablet Take by mouth.     No current facility-administered medications for this visit.    Facility-Administered Medications Ordered in Other Visits  Medication Dose Route Frequency Provider Last Rate Last Dose  . heparin lock flush 100 unit/mL  500 Units Intravenous Once Lloyd Huger, MD      . sodium chloride flush (NS) 0.9 % injection 10 mL  10 mL Intravenous PRN Lloyd Huger, MD   10 mL at 09/05/18 0822    OBJECTIVE: Vitals:   09/05/18 0831  BP: (!) 176/71  Pulse: 92  Temp: 97.8 F (36.6 C)     Body mass index is 21.68 kg/m.    ECOG FS:0 - Asymptomatic  General: Thin, no acute distress. Eyes: Pink conjunctiva, anicteric sclera. HEENT: Normocephalic, moist mucous membranes, clear oropharnyx. Lungs: Clear to auscultation bilaterally. Heart: Regular rate and rhythm. No rubs, murmurs, or gallops. Abdomen: Soft, nontender, nondistended. No organomegaly noted, normoactive bowel sounds. Musculoskeletal: No edema, cyanosis, or clubbing. Neuro: Alert, answering all questions appropriately. Cranial nerves grossly intact. Skin: No rashes or petechiae noted. Psych: Normal affect.  LAB RESULTS:  Lab Results  Component Value Date   NA 130 (L) 03/16/2017   K 3.3 (L) 03/16/2017   CL 97 (L) 03/16/2017   CO2 22 03/16/2017   GLUCOSE 147 (H) 03/16/2017   BUN 19 03/16/2017   CREATININE 0.90 03/16/2017   CALCIUM 9.5 03/16/2017   PROT 7.3 03/27/2013   ALBUMIN 4.1 03/27/2013   AST 12 (L) 03/27/2013   ALT 20 03/27/2013   ALKPHOS 87 03/27/2013   BILITOT 0.5 03/27/2013   GFRNONAA 57 (L) 03/16/2017   GFRAA >60 03/16/2017    Lab Results  Component Value Date   WBC 4.3  09/05/2018   NEUTROABS 2.6 09/05/2018   HGB 7.2 (L) 09/05/2018   HCT 20.7 (L) 09/05/2018   MCV 117.6 (H) 09/05/2018   PLT 352 09/05/2018   Lab Results  Component Value Date   IRON 241 (H) 09/05/2018   TIBC 336 09/05/2018   IRONPCTSAT 72 (H) 09/05/2018   Lab Results  Component Value Date   FERRITIN 1,156 (H) 09/05/2018     STUDIES: No results found.  ASSESSMENT: MDS 5q-, CLL   PLAN:    1. MDS, 5q-: Bone marrow biopsy results from June 30, 2017 reviewed independently confirming progression of patient's MDS, 5q-. She has a hypercellular marrow for age as well as an increased blast count up to 6%.  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  Patient continues to decline treatment with Revlimid.  Previously, we also discussed the  possibility of progressing to AML, but patient wishes no further interventions other than periodic blood transfusions.  Patient will return to clinic tomorrow for 1 unit of packed red blood cells.  Prior to this, patient's last blood transfusion was in November 2019.  Return to clinic in 1 month with repeat laboratory work, further evaluation, and consideration of blood and Desferal.   3.  Elevated iron stores: Chronic and unchanged.  Desferal as above.   4.  CLL: Bone marrow biopsy did not mention any involvement of CLL.  White count continues to be within normal limits.  CT scan results from June 09, 2014 were independently reviewed and did not reveal any evidence of recurrent disease.  Previously, all of her other labwork was either negative or within normal limits. Patient last received single agent Rituxan in February 2015.  5.  Anxiety: Continue Xanax as needed. 6.  Hypertension: Chronic and unchanged.  Unclear her compliance with her medications.  Continue follow-up and treatment per primary care. 7.  Back pain: Patient does not complain of this today. 8.  Abdominal bloating: MRI results from January 16, 2018  reviewed independently with no distinct etiology was noted, but incidentally a 1.7 cm cystic lesion suspicious for renal neoplasm was reported.  Continue follow-up with primary care.  Reimage in approximately October 2020.  Patient expressed understanding and was in agreement with this plan. She also understands that She can call clinic at any time with any questions, concerns, or complaints.   Lloyd Huger, MD   09/06/2018 6:24 AM

## 2018-09-04 ENCOUNTER — Telehealth: Payer: Self-pay | Admitting: Oncology

## 2018-09-04 ENCOUNTER — Other Ambulatory Visit: Payer: Self-pay

## 2018-09-04 DIAGNOSIS — D649 Anemia, unspecified: Secondary | ICD-10-CM

## 2018-09-04 NOTE — Telephone Encounter (Signed)
Spoke to pt and completed travel screen. Also explained about addl screening questions they will be asked, new guidelines about mask req, no visitors, and fever checks °

## 2018-09-05 ENCOUNTER — Inpatient Hospital Stay: Payer: Medicare Other

## 2018-09-05 ENCOUNTER — Other Ambulatory Visit: Payer: Self-pay

## 2018-09-05 ENCOUNTER — Ambulatory Visit: Payer: Medicare Other

## 2018-09-05 ENCOUNTER — Inpatient Hospital Stay: Payer: Medicare Other | Attending: Oncology

## 2018-09-05 ENCOUNTER — Inpatient Hospital Stay (HOSPITAL_BASED_OUTPATIENT_CLINIC_OR_DEPARTMENT_OTHER): Payer: Medicare Other | Admitting: Oncology

## 2018-09-05 ENCOUNTER — Encounter: Payer: Self-pay | Admitting: Oncology

## 2018-09-05 VITALS — BP 176/71 | HR 92 | Temp 97.8°F | Ht 60.0 in | Wt 111.0 lb

## 2018-09-05 DIAGNOSIS — F419 Anxiety disorder, unspecified: Secondary | ICD-10-CM | POA: Insufficient documentation

## 2018-09-05 DIAGNOSIS — D649 Anemia, unspecified: Secondary | ICD-10-CM

## 2018-09-05 DIAGNOSIS — Z87891 Personal history of nicotine dependence: Secondary | ICD-10-CM | POA: Diagnosis not present

## 2018-09-05 DIAGNOSIS — Z79899 Other long term (current) drug therapy: Secondary | ICD-10-CM

## 2018-09-05 DIAGNOSIS — Z7982 Long term (current) use of aspirin: Secondary | ICD-10-CM | POA: Diagnosis not present

## 2018-09-05 DIAGNOSIS — I10 Essential (primary) hypertension: Secondary | ICD-10-CM

## 2018-09-05 DIAGNOSIS — M1990 Unspecified osteoarthritis, unspecified site: Secondary | ICD-10-CM | POA: Diagnosis not present

## 2018-09-05 DIAGNOSIS — Z8543 Personal history of malignant neoplasm of ovary: Secondary | ICD-10-CM

## 2018-09-05 DIAGNOSIS — D46C Myelodysplastic syndrome with isolated del(5q) chromosomal abnormality: Secondary | ICD-10-CM | POA: Diagnosis not present

## 2018-09-05 LAB — IRON AND TIBC
Iron: 241 ug/dL — ABNORMAL HIGH (ref 28–170)
Saturation Ratios: 72 % — ABNORMAL HIGH (ref 10.4–31.8)
TIBC: 336 ug/dL (ref 250–450)
UIBC: 95 ug/dL

## 2018-09-05 LAB — CBC WITH DIFFERENTIAL/PLATELET
Abs Immature Granulocytes: 0.04 10*3/uL (ref 0.00–0.07)
Basophils Absolute: 0.1 10*3/uL (ref 0.0–0.1)
Basophils Relative: 3 %
Eosinophils Absolute: 0.1 10*3/uL (ref 0.0–0.5)
Eosinophils Relative: 3 %
HCT: 20.7 % — ABNORMAL LOW (ref 36.0–46.0)
Hemoglobin: 7.2 g/dL — ABNORMAL LOW (ref 12.0–15.0)
Immature Granulocytes: 1 %
Lymphocytes Relative: 18 %
Lymphs Abs: 0.8 10*3/uL (ref 0.7–4.0)
MCH: 40.9 pg — ABNORMAL HIGH (ref 26.0–34.0)
MCHC: 34.8 g/dL (ref 30.0–36.0)
MCV: 117.6 fL — ABNORMAL HIGH (ref 80.0–100.0)
Monocytes Absolute: 0.6 10*3/uL (ref 0.1–1.0)
Monocytes Relative: 14 %
Neutro Abs: 2.6 10*3/uL (ref 1.7–7.7)
Neutrophils Relative %: 61 %
Platelets: 352 10*3/uL (ref 150–400)
RBC: 1.76 MIL/uL — ABNORMAL LOW (ref 3.87–5.11)
RDW: 16.4 % — ABNORMAL HIGH (ref 11.5–15.5)
WBC: 4.3 10*3/uL (ref 4.0–10.5)
nRBC: 0 % (ref 0.0–0.2)

## 2018-09-05 LAB — SAMPLE TO BLOOD BANK

## 2018-09-05 LAB — FERRITIN: Ferritin: 1156 ng/mL — ABNORMAL HIGH (ref 11–307)

## 2018-09-05 MED ORDER — HEPARIN SOD (PORK) LOCK FLUSH 100 UNIT/ML IV SOLN: 500.0000 [IU] | Freq: Once | INTRAVENOUS | Status: AC

## 2018-09-05 MED ORDER — SODIUM CHLORIDE 0.9% FLUSH
10.0000 mL | INTRAVENOUS | Status: AC | PRN
  Administered 2018-09-05: 10 mL via INTRAVENOUS
  Filled 2018-09-05: qty 10

## 2018-09-05 NOTE — Progress Notes (Signed)
Patient stated that she stays tired and weak all the time.

## 2018-09-06 ENCOUNTER — Other Ambulatory Visit: Payer: Self-pay

## 2018-09-06 ENCOUNTER — Inpatient Hospital Stay: Payer: Medicare Other

## 2018-09-06 DIAGNOSIS — D46C Myelodysplastic syndrome with isolated del(5q) chromosomal abnormality: Secondary | ICD-10-CM | POA: Diagnosis not present

## 2018-09-06 LAB — PREPARE RBC (CROSSMATCH)

## 2018-09-06 MED ORDER — HEPARIN SOD (PORK) LOCK FLUSH 100 UNIT/ML IV SOLN
500.0000 [IU] | Freq: Once | INTRAVENOUS | Status: AC
  Administered 2018-09-06: 500 [IU] via INTRAVENOUS
  Filled 2018-09-06: qty 5

## 2018-09-06 MED ORDER — ACETAMINOPHEN 325 MG PO TABS
650.0000 mg | ORAL_TABLET | Freq: Once | ORAL | Status: AC
  Administered 2018-09-06: 650 mg via ORAL
  Filled 2018-09-06: qty 2

## 2018-09-06 MED ORDER — SODIUM CHLORIDE 0.9 % IV SOLN
15.0000 mg/kg/h | Freq: Once | INTRAVENOUS | Status: DC
  Filled 2018-09-06: qty 2

## 2018-09-06 MED ORDER — SODIUM CHLORIDE 0.9% IV SOLUTION
250.0000 mL | Freq: Once | INTRAVENOUS | Status: AC
  Administered 2018-09-06: 250 mL via INTRAVENOUS
  Filled 2018-09-06: qty 250

## 2018-09-06 MED ORDER — SODIUM CHLORIDE 0.9 % IV SOLN
15.0000 mg/kg/h | Freq: Once | INTRAVENOUS | Status: AC
  Administered 2018-09-06: 15 mg/kg/h via INTRAVENOUS
  Filled 2018-09-06: qty 2

## 2018-09-06 MED ORDER — DIPHENHYDRAMINE HCL 50 MG/ML IJ SOLN
25.0000 mg | Freq: Once | INTRAMUSCULAR | Status: AC
  Administered 2018-09-06: 25 mg via INTRAVENOUS
  Filled 2018-09-06: qty 1

## 2018-09-06 MED ORDER — SODIUM CHLORIDE 0.9 % IV SOLN: 15.0000 mg/kg/h | Freq: Once | INTRAVENOUS | Status: DC

## 2018-09-06 NOTE — Progress Notes (Signed)
Dr. Grayland Ormond was made aware of Jacqueline Russo's elevated BP prior to her blood transfusion. Ordered to continue with blood transfusion. Patient tolerated treatment well.

## 2018-09-07 LAB — TYPE AND SCREEN
ABO/RH(D): AB POS
Antibody Screen: NEGATIVE
Unit division: 0

## 2018-09-07 LAB — BPAM RBC
Blood Product Expiration Date: 202006192359
ISSUE DATE / TIME: 202006181303
Unit Type and Rh: 600

## 2018-09-25 ENCOUNTER — Other Ambulatory Visit: Payer: Medicare Other

## 2018-09-26 ENCOUNTER — Ambulatory Visit: Payer: Medicare Other

## 2018-09-26 ENCOUNTER — Ambulatory Visit: Payer: Medicare Other | Admitting: Oncology

## 2018-10-02 ENCOUNTER — Other Ambulatory Visit: Payer: Self-pay | Admitting: *Deleted

## 2018-10-02 ENCOUNTER — Other Ambulatory Visit: Payer: Self-pay

## 2018-10-02 DIAGNOSIS — D649 Anemia, unspecified: Secondary | ICD-10-CM

## 2018-10-02 MED ORDER — HYDROCODONE-ACETAMINOPHEN 5-325 MG PO TABS: 1.0000 | ORAL_TABLET | ORAL | 0 refills | Status: DC | PRN

## 2018-10-03 ENCOUNTER — Other Ambulatory Visit: Payer: Self-pay

## 2018-10-04 ENCOUNTER — Inpatient Hospital Stay: Payer: Medicare Other | Attending: Oncology

## 2018-10-04 ENCOUNTER — Encounter: Payer: Self-pay | Admitting: Oncology

## 2018-10-04 ENCOUNTER — Other Ambulatory Visit: Payer: Self-pay

## 2018-10-04 ENCOUNTER — Inpatient Hospital Stay (HOSPITAL_BASED_OUTPATIENT_CLINIC_OR_DEPARTMENT_OTHER): Payer: Medicare Other | Admitting: Oncology

## 2018-10-04 VITALS — BP 219/84 | HR 91 | Temp 99.1°F | Wt 110.0 lb

## 2018-10-04 DIAGNOSIS — F419 Anxiety disorder, unspecified: Secondary | ICD-10-CM

## 2018-10-04 DIAGNOSIS — R0600 Dyspnea, unspecified: Secondary | ICD-10-CM | POA: Insufficient documentation

## 2018-10-04 DIAGNOSIS — R0609 Other forms of dyspnea: Secondary | ICD-10-CM | POA: Insufficient documentation

## 2018-10-04 DIAGNOSIS — I10 Essential (primary) hypertension: Secondary | ICD-10-CM | POA: Diagnosis not present

## 2018-10-04 DIAGNOSIS — Z7982 Long term (current) use of aspirin: Secondary | ICD-10-CM | POA: Insufficient documentation

## 2018-10-04 DIAGNOSIS — Z8543 Personal history of malignant neoplasm of ovary: Secondary | ICD-10-CM | POA: Insufficient documentation

## 2018-10-04 DIAGNOSIS — Z79899 Other long term (current) drug therapy: Secondary | ICD-10-CM

## 2018-10-04 DIAGNOSIS — D469 Myelodysplastic syndrome, unspecified: Secondary | ICD-10-CM

## 2018-10-04 DIAGNOSIS — Z87891 Personal history of nicotine dependence: Secondary | ICD-10-CM | POA: Insufficient documentation

## 2018-10-04 DIAGNOSIS — C9110 Chronic lymphocytic leukemia of B-cell type not having achieved remission: Secondary | ICD-10-CM | POA: Insufficient documentation

## 2018-10-04 DIAGNOSIS — Z90710 Acquired absence of both cervix and uterus: Secondary | ICD-10-CM

## 2018-10-04 DIAGNOSIS — D46C Myelodysplastic syndrome with isolated del(5q) chromosomal abnormality: Secondary | ICD-10-CM

## 2018-10-04 DIAGNOSIS — D649 Anemia, unspecified: Secondary | ICD-10-CM

## 2018-10-04 LAB — CBC WITH DIFFERENTIAL/PLATELET
Abs Immature Granulocytes: 0.04 10*3/uL (ref 0.00–0.07)
Basophils Absolute: 0.1 10*3/uL (ref 0.0–0.1)
Basophils Relative: 2 %
Eosinophils Absolute: 0.1 10*3/uL (ref 0.0–0.5)
Eosinophils Relative: 3 %
HCT: 24.5 % — ABNORMAL LOW (ref 36.0–46.0)
Hemoglobin: 8.3 g/dL — ABNORMAL LOW (ref 12.0–15.0)
Immature Granulocytes: 1 %
Lymphocytes Relative: 26 %
Lymphs Abs: 0.9 10*3/uL (ref 0.7–4.0)
MCH: 38.4 pg — ABNORMAL HIGH (ref 26.0–34.0)
MCHC: 33.9 g/dL (ref 30.0–36.0)
MCV: 113.4 fL — ABNORMAL HIGH (ref 80.0–100.0)
Monocytes Absolute: 0.5 10*3/uL (ref 0.1–1.0)
Monocytes Relative: 13 %
Neutro Abs: 2 10*3/uL (ref 1.7–7.7)
Neutrophils Relative %: 55 %
Platelets: 265 10*3/uL (ref 150–400)
RBC: 2.16 MIL/uL — ABNORMAL LOW (ref 3.87–5.11)
RDW: 21.8 % — ABNORMAL HIGH (ref 11.5–15.5)
Smear Review: ADEQUATE
WBC Morphology: ABNORMAL
WBC: 3.7 10*3/uL — ABNORMAL LOW (ref 4.0–10.5)
nRBC: 0 % (ref 0.0–0.2)

## 2018-10-04 NOTE — Progress Notes (Signed)
Patient stated that she had been doing well with no complaints. 

## 2018-10-05 ENCOUNTER — Inpatient Hospital Stay: Payer: Medicare Other

## 2018-10-05 LAB — SAMPLE TO BLOOD BANK

## 2018-10-05 NOTE — Progress Notes (Signed)
[place]  [phone] [phone]  ID: Chaney Born OB: July 10, 1929  MR#: [id number]  AOZ#:[id number]  Patient Care Team: [name], MD as PCP - General (Internal Medicine)  CHIEF COMPLAINT: MDS 5q-, CLL  INTERVAL HISTORY: Patient returns to clinic today for repeat laboratory work, further evaluation, consideration of blood transfusion.  She continues to have chronic weakness and fatigue, but this is improved since receiving a blood transfusion approximately 1 month ago.  She otherwise feels well and is asymptomatic. She has no neurologic complaints. She denies any recent fevers or illnesses. She denies any night sweats or weight loss.  She denies any chest pain, shortness of breath, cough, or hemoptysis.  She denies any nausea, vomiting, constipation, or diarrhea. She denies any melena or hematochezia. She has no urinary complaints.  Patient offers no further specific complaints today.  REVIEW OF SYSTEMS:   Review of Systems  Constitutional: Positive for malaise/fatigue. Negative for fever and weight loss.  Respiratory: Negative.  Negative for cough and shortness of breath.   Cardiovascular: Negative.  Negative for chest pain and leg swelling.  Gastrointestinal: Negative.  Negative for abdominal pain, blood in stool and melena.  Genitourinary: Negative.  Negative for dysuria.  Musculoskeletal: Negative.  Negative for back pain and joint pain.  Skin: Negative.  Negative for rash.  Neurological: Positive for weakness. Negative for dizziness, sensory change, focal weakness and headaches.  Endo/Heme/Allergies: Does not bruise/bleed easily.  Psychiatric/Behavioral: Negative.  The patient is not nervous/anxious.     As per HPI. Otherwise, a complete review of systems is negative.  PAST MEDICAL HISTORY: Past Medical History:  Diagnosis Date  . Anemia    Myelodysplastic syndrome  . Arthritis   . Hypertension   . Leukemia, chronic lymphoid  (HCC)    CLL  . Ovarian cancer ([place]) 1963    PAST SURGICAL HISTORY: Past Surgical History:  Procedure Laterality Date  . ABDOMINAL HYSTERECTOMY    . APPENDECTOMY    . BREAST SURGERY     cyst removed from right breast  . EYE SURGERY Bilateral    Cataract Extractions  . IR FLUORO GUIDE CV LINE RIGHT  06/30/2017  . REVERSE SHOULDER ARTHROPLASTY Right 10/28/2014   Procedure: REVERSE SHOULDER ARTHROPLASTY;  Surgeon: Corky Mull, MD;  Location: [facility] ORS;  Service: Orthopedics;  Laterality: Right;    FAMILY HISTORY: Reviewed and unchanged. No reported history of malignancy or chronic disease.     ADVANCED DIRECTIVES:    HEALTH MAINTENANCE: Social History   Tobacco Use  . Smoking status: Former Smoker    Packs/day: 2.00    Types: Cigarettes    Quit date: 1963    Years since quitting: 57.5  . Smokeless tobacco: Never Used  Substance Use Topics  . Alcohol use: Yes    Alcohol/week: 1.0 standard drinks    Types: 1 Glasses of wine per week    Comment: 1 glass of wine daily  . Drug use: No     Colonoscopy:  PAP:  Bone density:  Lipid panel:  No Known Allergies  Current Outpatient Medications  Medication Sig Dispense Refill  . ALPRAZolam (XANAX) 0.25 MG tablet Take 1 tablet (0.25 mg total) by mouth daily as needed for anxiety. 90 tablet 0  . amLODipine (NORVASC) 10 MG tablet     . aspirin 81 MG tablet Take 81 mg by mouth daily.    . Colchicine 0.6 MG CAPS Take 1 tablet by mouth 2 (two) times daily as needed.    Marland Kitchen  cyanocobalamin (,VITAMIN B-12,) 1000 MCG/ML injection Inject into the muscle.    . diphenhydrAMINE (BENADRYL) 50 MG tablet Take 50 mg by mouth at bedtime as needed for itching.    . hydrALAZINE (APRESOLINE) 25 MG tablet Take by mouth.     . hydrochlorothiazide (HYDRODIURIL) 12.5 MG tablet Take 12.5 mg by mouth daily.     Marland Kitchen HYDROcodone-acetaminophen (NORCO/VICODIN) 5-325 MG tablet Take 1-2 tablets by mouth every 4 (four) hours as needed for moderate pain or severe  pain. 90 tablet 0  . lidocaine-prilocaine (EMLA) cream Apply 1 application topically as needed. Apply to port 1-2 hours prior to appointment. Cover with plastic wrap. 30 g 0  . MITIGARE 0.6 MG CAPS Take 1 capsule by mouth 1 day or 1 dose.    . olmesartan (BENICAR) 20 MG tablet Take by mouth.    . SF 1.1 % GEL dental gel     . tiZANidine (ZANAFLEX) 2 MG tablet Take 1 tablet (2 mg total) by mouth at bedtime as needed for muscle spasms. 30 tablet 0  . traMADol-acetaminophen (ULTRACET) 37.5-325 MG per tablet Take 1 tablet by mouth every 8 (eight) hours as needed.     No current facility-administered medications for this visit.    Facility-Administered Medications Ordered in Other Visits  Medication Dose Route Frequency Provider Last Rate Last Dose  . heparin lock flush 100 unit/mL  500 Units Intravenous Once Lloyd Huger, MD      . sodium chloride flush (NS) 0.9 % injection 10 mL  10 mL Intravenous PRN Lloyd Huger, MD   10 mL at 09/05/18 0822    OBJECTIVE: Vitals:   10/04/18 1036  BP: (!) 219/84  Pulse: 91  Temp: 99.1 F (37.3 C)     Body mass index is 21.48 kg/m.    ECOG FS:0 - Asymptomatic  General: Thin, no acute distress. Eyes: Pink conjunctiva, anicteric sclera. HEENT: Normocephalic, moist mucous membranes. Lungs: Clear to auscultation bilaterally. Heart: Regular rate and rhythm. No rubs, murmurs, or gallops. Abdomen: Soft, nontender, nondistended. No organomegaly noted, normoactive bowel sounds. Musculoskeletal: No edema, cyanosis, or clubbing. Neuro: Alert, answering all questions appropriately. Cranial nerves grossly intact. Skin: No rashes or petechiae noted. Psych: Normal affect.  LAB RESULTS:  Lab Results  Component Value Date   NA 130 (L) 03/16/2017   K 3.3 (L) 03/16/2017   CL 97 (L) 03/16/2017   CO2 22 03/16/2017   GLUCOSE 147 (H) 03/16/2017   BUN 19 03/16/2017   CREATININE 0.90 03/16/2017   CALCIUM 9.5 03/16/2017   PROT 7.3 03/27/2013    ALBUMIN 4.1 03/27/2013   AST 12 (L) 03/27/2013   ALT 20 03/27/2013   ALKPHOS 87 03/27/2013   BILITOT 0.5 03/27/2013   GFRNONAA 57 (L) 03/16/2017   GFRAA >60 03/16/2017    Lab Results  Component Value Date   WBC 3.7 (L) 10/04/2018   NEUTROABS 2.0 10/04/2018   HGB 8.3 (L) 10/04/2018   HCT 24.5 (L) 10/04/2018   MCV 113.4 (H) 10/04/2018   PLT 265 10/04/2018   Lab Results  Component Value Date   IRON 241 (H) 09/05/2018   TIBC 336 09/05/2018   IRONPCTSAT 72 (H) 09/05/2018   Lab Results  Component Value Date   FERRITIN 1,156 (H) 09/05/2018     STUDIES: No results found.  ASSESSMENT: MDS 5q-, CLL   PLAN:    1. MDS, 5q-: Bone marrow biopsy results from June 30, 2017 reviewed independently confirming progression of patient's MDS, 5q-. She  has a hypercellular marrow for age as well as an increased blast count up to 6%.  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  Patient continues to decline treatment with Revlimid.  Previously, we also discussed the possibility of progressing to AML, but patient wishes no further interventions other than periodic blood transfusions.  Patient last received transfusion in June 2020.  No intervention is needed at this time.  Return to clinic in 2 months with repeat laboratory, further evaluation, and consideration of blood and Desferal.   3.  Elevated iron stores: Chronic and unchanged.  Desferal as above.   4.  CLL: Bone marrow biopsy did not mention any involvement of CLL.  White count continues to be within normal limits.  CT scan results from June 09, 2014 were independently reviewed and did not reveal any evidence of recurrent disease.  Previously, all of her other labwork was either negative or within normal limits. Patient last received single agent Rituxan in February 2015.  5.  Anxiety: Continue Xanax as needed. 6.  Hypertension: Chronic and unchanged.  Unclear her compliance with her medications.   Continue follow-up and treatment per primary care. 7.  Back pain: Patient does not complain of this today. 8.  Abdominal bloating: MRI results from January 16, 2018 reviewed independently with no distinct etiology was noted, but incidentally a 1.7 cm cystic lesion suspicious for renal neoplasm was reported.  Continue follow-up with primary care.  Reimage in approximately October 2020.  Patient expressed understanding and was in agreement with this plan. She also understands that She can call clinic at any time with any questions, concerns, or complaints.   Lloyd Huger, MD   10/05/2018 7:10 AM

## 2018-10-08 ENCOUNTER — Other Ambulatory Visit: Payer: Self-pay | Admitting: *Deleted

## 2018-10-15 ENCOUNTER — Other Ambulatory Visit: Payer: Self-pay | Admitting: *Deleted

## 2018-10-15 MED ORDER — ALPRAZOLAM 0.25 MG PO TABS: 0.2500 mg | ORAL_TABLET | Freq: Every day | ORAL | 0 refills | Status: DC | PRN

## 2018-10-15 NOTE — Telephone Encounter (Addendum)
I called patient and asked her if she wants prescription [facility] to [facility] or [facility] and she asked that it go to [facility]

## 2018-10-22 ENCOUNTER — Other Ambulatory Visit: Payer: Medicare Other

## 2018-10-24 ENCOUNTER — Ambulatory Visit: Payer: Medicare Other | Admitting: Oncology

## 2018-10-24 ENCOUNTER — Ambulatory Visit: Payer: Medicare Other

## 2018-11-12 ENCOUNTER — Other Ambulatory Visit: Payer: Self-pay | Admitting: *Deleted

## 2018-11-12 MED ORDER — ALPRAZOLAM 0.25 MG PO TABS: 0.2500 mg | ORAL_TABLET | Freq: Every day | ORAL | 0 refills | Status: DC | PRN

## 2018-11-12 MED ORDER — HYDROCODONE-ACETAMINOPHEN 5-325 MG PO TABS: 1.0000 | ORAL_TABLET | ORAL | 0 refills | Status: DC | PRN

## 2018-11-12 NOTE — Telephone Encounter (Signed)
Patient called cancer center requesting refill of Xanax 0.25mg  PRN daily for anxiety and Norco 5-325 mg tab PRN q 4 hours for pain.   As mandated by the [place] STOP Act (Strengthen Opioid Misuse Prevention), the [place] Controlled Substance Reporting System ([place]) was reviewed for this patient. Below is the past 50-months of controlled substance prescriptions as displayed by the registry. I have personally consulted with my supervising physician, Dr. Grayland Ormond, who agrees that continuation of opiate therapy is medically appropriate at this time and agrees to provide continual monitoring, including urine/blood drug screens, as indicated. Refill is appropriate on or after   [place] reviewed: Reviewed and ok for refill.   Xanax filled last on 10/17/18 30 tabs. Ok to refill on 11/14/18. Adjusted RX.     Faythe Casa, NP 11/12/2018 10:15 AM [phone]

## 2018-11-21 ENCOUNTER — Other Ambulatory Visit: Payer: Self-pay

## 2018-11-21 ENCOUNTER — Encounter: Payer: Self-pay | Admitting: Oncology

## 2018-11-21 ENCOUNTER — Other Ambulatory Visit: Payer: Self-pay | Admitting: Nurse Practitioner

## 2018-11-21 ENCOUNTER — Inpatient Hospital Stay: Payer: Medicare Other | Attending: Oncology | Admitting: *Deleted

## 2018-11-21 DIAGNOSIS — F419 Anxiety disorder, unspecified: Secondary | ICD-10-CM | POA: Insufficient documentation

## 2018-11-21 DIAGNOSIS — C9110 Chronic lymphocytic leukemia of B-cell type not having achieved remission: Secondary | ICD-10-CM | POA: Diagnosis present

## 2018-11-21 DIAGNOSIS — Z7982 Long term (current) use of aspirin: Secondary | ICD-10-CM | POA: Insufficient documentation

## 2018-11-21 DIAGNOSIS — R5381 Other malaise: Secondary | ICD-10-CM | POA: Insufficient documentation

## 2018-11-21 DIAGNOSIS — D649 Anemia, unspecified: Secondary | ICD-10-CM

## 2018-11-21 DIAGNOSIS — Z79899 Other long term (current) drug therapy: Secondary | ICD-10-CM | POA: Insufficient documentation

## 2018-11-21 DIAGNOSIS — I10 Essential (primary) hypertension: Secondary | ICD-10-CM | POA: Insufficient documentation

## 2018-11-21 DIAGNOSIS — R531 Weakness: Secondary | ICD-10-CM | POA: Insufficient documentation

## 2018-11-21 DIAGNOSIS — M549 Dorsalgia, unspecified: Secondary | ICD-10-CM | POA: Diagnosis not present

## 2018-11-21 DIAGNOSIS — D46C Myelodysplastic syndrome with isolated del(5q) chromosomal abnormality: Secondary | ICD-10-CM

## 2018-11-21 DIAGNOSIS — R5383 Other fatigue: Secondary | ICD-10-CM | POA: Insufficient documentation

## 2018-11-21 DIAGNOSIS — Z87891 Personal history of nicotine dependence: Secondary | ICD-10-CM | POA: Diagnosis not present

## 2018-11-21 LAB — CBC WITH DIFFERENTIAL/PLATELET
Abs Immature Granulocytes: 0 10*3/uL (ref 0.00–0.07)
Basophils Absolute: 0.1 10*3/uL (ref 0.0–0.1)
Basophils Relative: 2 %
Eosinophils Absolute: 0.1 10*3/uL (ref 0.0–0.5)
Eosinophils Relative: 4 %
HCT: 22.4 % — ABNORMAL LOW (ref 36.0–46.0)
Hemoglobin: 7.5 g/dL — ABNORMAL LOW (ref 12.0–15.0)
Lymphocytes Relative: 32 %
Lymphs Abs: 1.2 10*3/uL (ref 0.7–4.0)
MCH: 40.5 pg — ABNORMAL HIGH (ref 26.0–34.0)
MCHC: 33.5 g/dL (ref 30.0–36.0)
MCV: 121.1 fL — ABNORMAL HIGH (ref 80.0–100.0)
Metamyelocytes Relative: 1 %
Monocytes Absolute: 0.4 10*3/uL (ref 0.1–1.0)
Monocytes Relative: 11 %
Neutro Abs: 1.8 10*3/uL (ref 1.7–7.7)
Neutrophils Relative %: 50 %
Platelets: 231 10*3/uL (ref 150–400)
RBC: 1.85 MIL/uL — ABNORMAL LOW (ref 3.87–5.11)
RDW: 18.1 % — ABNORMAL HIGH (ref 11.5–15.5)
Smear Review: ADEQUATE
WBC Morphology: ABNORMAL
WBC: 3.6 10*3/uL — ABNORMAL LOW (ref 4.0–10.5)
nRBC: 0 % (ref 0.0–0.2)

## 2018-11-21 LAB — SAMPLE TO BLOOD BANK

## 2018-11-21 LAB — PREPARE RBC (CROSSMATCH)

## 2018-11-21 NOTE — Progress Notes (Signed)
Patient stated that she had been feeling tired and fatigued. Patient also stated that she had been having some abdominal pain on her left side. Patient would also want a Valium when she gets her blood.

## 2018-11-22 ENCOUNTER — Other Ambulatory Visit: Payer: Self-pay

## 2018-11-22 ENCOUNTER — Inpatient Hospital Stay: Payer: Medicare Other

## 2018-11-22 ENCOUNTER — Inpatient Hospital Stay (HOSPITAL_BASED_OUTPATIENT_CLINIC_OR_DEPARTMENT_OTHER): Payer: Medicare Other | Admitting: Oncology

## 2018-11-22 DIAGNOSIS — C9110 Chronic lymphocytic leukemia of B-cell type not having achieved remission: Secondary | ICD-10-CM | POA: Diagnosis not present

## 2018-11-22 DIAGNOSIS — D46C Myelodysplastic syndrome with isolated del(5q) chromosomal abnormality: Secondary | ICD-10-CM

## 2018-11-22 DIAGNOSIS — I6523 Occlusion and stenosis of bilateral carotid arteries: Secondary | ICD-10-CM | POA: Diagnosis not present

## 2018-11-22 DIAGNOSIS — D649 Anemia, unspecified: Secondary | ICD-10-CM

## 2018-11-22 MED ORDER — ACETAMINOPHEN 325 MG PO TABS
650.0000 mg | ORAL_TABLET | Freq: Once | ORAL | Status: AC
  Administered 2018-11-22: 650 mg via ORAL
  Filled 2018-11-22: qty 2

## 2018-11-22 MED ORDER — HEPARIN SOD (PORK) LOCK FLUSH 100 UNIT/ML IV SOLN
500.0000 [IU] | Freq: Every day | INTRAVENOUS | Status: AC | PRN
  Administered 2018-11-22: 500 [IU]
  Filled 2018-11-22 (×2): qty 5

## 2018-11-22 MED ORDER — DIAZEPAM 5 MG PO TABS
5.0000 mg | ORAL_TABLET | Freq: Once | ORAL | Status: AC
  Administered 2018-11-22: 5 mg via ORAL
  Filled 2018-11-22: qty 1

## 2018-11-22 MED ORDER — DIPHENHYDRAMINE HCL 25 MG PO CAPS
25.0000 mg | ORAL_CAPSULE | Freq: Once | ORAL | Status: AC
  Administered 2018-11-22: 25 mg via ORAL
  Filled 2018-11-22: qty 1

## 2018-11-22 MED ORDER — SODIUM CHLORIDE 0.9 % IV SOLN
15.0000 mg/kg/h | Freq: Once | INTRAVENOUS | Status: AC
  Administered 2018-11-22: 15 mg/kg/h via INTRAVENOUS
  Filled 2018-11-22: qty 2

## 2018-11-22 MED ORDER — SODIUM CHLORIDE 0.9% FLUSH
10.0000 mL | INTRAVENOUS | Status: AC | PRN
  Administered 2018-11-22: 10 mL
  Filled 2018-11-22: qty 10

## 2018-11-22 MED ORDER — SODIUM CHLORIDE 0.9% IV SOLUTION
250.0000 mL | Freq: Once | INTRAVENOUS | Status: AC
  Administered 2018-11-22: 250 mL via INTRAVENOUS
  Filled 2018-11-22: qty 250

## 2018-11-22 NOTE — Progress Notes (Signed)
MD aware of pt.'s BP elevated prior to blood transfusion- BP-184/79, P-80. Pt is not having any complaints and states she feels fine. Verbal order to continue with blood transfusion. Pt received pre medications, tylenol and benadryl at 0953. Her desferal infusion ended at 1300. MD made aware, and verbal order to reorder tylenol 650mg  PO and benadryl 25mg  PO prior to blood transfusion. MD made aware of pt.'s elevated blood pressure of 189/74 P-90 at the 15 minute mark after the blood was started at 1318, order to proceed with blood transfusion. Pt is asymptomatic and states, "I feel fine, I have always had high blood pressure and I took my morning medications." MD made aware of BP of 185/84 P-87 after blood transfusion was completed at 1500. Pt is not having any complaints and states she feels fine. Per MD pt may be discharged home.   Jacqueline Russo [employer]

## 2018-11-22 NOTE — Progress Notes (Signed)
[place]  Telephone:(336) [phone] [phone]) [phone]  ID: Chaney Born OB: [birth year]  MR#: [id number]  JSH#:[id number]  Patient Care Team: [name], MD as PCP - General (Internal Medicine)  CHIEF COMPLAINT: MDS 5q-, CLL  INTERVAL HISTORY: Patient returns to clinic today for repeat laboratory, further evaluation, and consideration of blood transfusion.  Her weakness and fatigue have been worse over the past couple weeks.  She also reports an episode of "shaking all over" approximately 2 weeks ago that resolved without intervention and patient did not seek medical evaluation.  She has no neurologic complaints. She denies any recent fevers or illnesses. She denies any night sweats or weight loss.  She denies any chest pain, shortness of breath, cough, or hemoptysis.  She denies any nausea, vomiting, constipation, or diarrhea. She denies any melena or hematochezia. She has no urinary complaints.  Patient offers no further specific complaints today.  REVIEW OF SYSTEMS:   Review of Systems  Constitutional: Positive for malaise/fatigue. Negative for fever and weight loss.  Respiratory: Negative.  Negative for cough and shortness of breath.   Cardiovascular: Negative.  Negative for chest pain and leg swelling.  Gastrointestinal: Negative.  Negative for abdominal pain, blood in stool and melena.  Genitourinary: Negative.  Negative for dysuria.  Musculoskeletal: Negative.  Negative for back pain and joint pain.  Skin: Negative.  Negative for rash.  Neurological: Positive for weakness. Negative for dizziness, sensory change, focal weakness and headaches.  Endo/Heme/Allergies: Does not bruise/bleed easily.  Psychiatric/Behavioral: The patient is nervous/anxious.     As per HPI. Otherwise, a complete review of systems is negative.  PAST MEDICAL HISTORY: Past Medical History:  Diagnosis Date  . Anemia    Myelodysplastic syndrome  . Arthritis   . Hypertension   .  Leukemia, chronic lymphoid (HCC)    CLL  . Ovarian cancer ([place]) 1963    PAST SURGICAL HISTORY: Past Surgical History:  Procedure Laterality Date  . ABDOMINAL HYSTERECTOMY    . APPENDECTOMY    . BREAST SURGERY     cyst removed from right breast  . EYE SURGERY Bilateral    Cataract Extractions  . IR FLUORO GUIDE CV LINE RIGHT  06/30/2017  . REVERSE SHOULDER ARTHROPLASTY Right 10/28/2014   Procedure: REVERSE SHOULDER ARTHROPLASTY;  Surgeon: Corky Mull, MD;  Location: [facility] ORS;  Service: Orthopedics;  Laterality: Right;    FAMILY HISTORY: Reviewed and unchanged. No reported history of malignancy or chronic disease.     ADVANCED DIRECTIVES:    HEALTH MAINTENANCE: Social History   Tobacco Use  . Smoking status: Former Smoker    Packs/day: 2.00    Types: Cigarettes    Quit date: 1963    Years since quitting: 57.7  . Smokeless tobacco: Never Used  Substance Use Topics  . Alcohol use: Yes    Alcohol/week: 1.0 standard drinks    Types: 1 Glasses of wine per week    Comment: 1 glass of wine daily  . Drug use: No     Colonoscopy:  PAP:  Bone density:  Lipid panel:  No Known Allergies  Current Outpatient Medications  Medication Sig Dispense Refill  . ALPRAZolam (XANAX) 0.25 MG tablet Take 1 tablet (0.25 mg total) by mouth daily as needed for anxiety. 90 tablet 0  . aspirin 81 MG tablet Take 81 mg by mouth daily.    . cyanocobalamin (,VITAMIN B-12,) 1000 MCG/ML injection Inject into the muscle.    . diltiazem (CARDIZEM CD) 240 MG  24 hr capsule Take 1 capsule by mouth at bedtime.    . diphenhydrAMINE (BENADRYL) 50 MG tablet Take 50 mg by mouth at bedtime as needed for itching.    . diphenhydrAMINE (SOMINEX) 25 MG tablet Take 25 mg by mouth at bedtime.    Marland Kitchen HYDROcodone-acetaminophen (NORCO/VICODIN) 5-325 MG tablet Take 1-2 tablets by mouth every 4 (four) hours as needed for moderate pain or severe pain. 90 tablet 0  . Multiple Vitamins-Minerals (PRESERVISION AREDS 2+MULTI  VIT PO) Take 1 tablet by mouth 1 day or 1 dose.    . olmesartan (BENICAR) 20 MG tablet Take by mouth.    Marland Kitchen tiZANidine (ZANAFLEX) 2 MG tablet Take 1 tablet (2 mg total) by mouth at bedtime as needed for muscle spasms. 30 tablet 0  . lidocaine-prilocaine (EMLA) cream Apply 1 application topically as needed. Apply to port 1-2 hours prior to appointment. Cover with plastic wrap. (Patient not taking: Reported on 11/21/2018) 30 g 0   Current Facility-Administered Medications  Medication Dose Route Frequency Provider Last Rate Last Dose  . deferoxamine (DESFERAL) 2,000 mg in sodium chloride 0.9 % 250 mL infusion  15 mg/kg/hr Intravenous Once Lloyd Huger, MD       Facility-Administered Medications Ordered in Other Visits  Medication Dose Route Frequency Provider Last Rate Last Dose  . diazepam (VALIUM) tablet 5 mg  5 mg Oral Once Lloyd Huger, MD      . heparin lock flush 100 unit/mL  500 Units Intravenous Once Lloyd Huger, MD      . sodium chloride flush (NS) 0.9 % injection 10 mL  10 mL Intravenous PRN Lloyd Huger, MD   10 mL at 09/05/18 0822    OBJECTIVE: Vitals:   11/22/18 0901  BP: (!) 189/77  Resp: 16  Temp: 99.2 F (37.3 C)     Body mass index is 21.5 kg/m.    ECOG FS:1 - Symptomatic but completely ambulatory  General: Thin, no acute distress. Eyes: Pink conjunctiva, anicteric sclera. HEENT: Normocephalic, moist mucous membranes. Lungs: Clear to auscultation bilaterally. Heart: Regular rate and rhythm. No rubs, murmurs, or gallops. Abdomen: Soft, nontender, nondistended. No organomegaly noted, normoactive bowel sounds. Musculoskeletal: No edema, cyanosis, or clubbing. Neuro: Alert, answering all questions appropriately. Cranial nerves grossly intact. Skin: No rashes or petechiae noted. Psych: Normal affect.  LAB RESULTS:  Lab Results  Component Value Date   NA 130 (L) 03/16/2017   K 3.3 (L) 03/16/2017   CL 97 (L) 03/16/2017   CO2 22 03/16/2017    GLUCOSE 147 (H) 03/16/2017   BUN 19 03/16/2017   CREATININE 0.90 03/16/2017   CALCIUM 9.5 03/16/2017   PROT 7.3 03/27/2013   ALBUMIN 4.1 03/27/2013   AST 12 (L) 03/27/2013   ALT 20 03/27/2013   ALKPHOS 87 03/27/2013   BILITOT 0.5 03/27/2013   GFRNONAA 57 (L) 03/16/2017   GFRAA >60 03/16/2017    Lab Results  Component Value Date   WBC 3.6 (L) 11/21/2018   NEUTROABS 1.8 11/21/2018   HGB 7.5 (L) 11/21/2018   HCT 22.4 (L) 11/21/2018   MCV 121.1 (H) 11/21/2018   PLT 231 11/21/2018   Lab Results  Component Value Date   IRON 241 (H) 09/05/2018   TIBC 336 09/05/2018   IRONPCTSAT 72 (H) 09/05/2018   Lab Results  Component Value Date   FERRITIN 1,156 (H) 09/05/2018     STUDIES: No results found.  ASSESSMENT: MDS 5q-, CLL   PLAN:    1.  MDS, 5q-: Bone marrow biopsy results from June 30, 2017 reviewed independently confirming progression of patient's MDS, 5q-. She has a hypercellular marrow for age as well as an increased blast count up to 6%.  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  Patient continues to decline treatment with Revlimid.  Previously, we also discussed the possibility of progressing to AML, but patient wishes no further interventions other than periodic blood transfusions.  Patient last received transfusion in June 2020.  Patient's hemoglobin has decreased to 7.5 and she is symptomatic, therefore will proceed with 1 unit of packed red blood cells today.  Patient also gets cholesterol with a confusion.  Return to clinic in 8 weeks for repeat laboratory work, further evaluation no intervention is needed at this time.  2.  Elevated iron stores: Chronic and unchanged.  Desferal as above.   3.  CLL: Bone marrow biopsy did not mention any involvement of CLL.  White count is mildly decreased today.  CT scan results from June 09, 2014 were independently reviewed and did not reveal any evidence of recurrent disease.  Previously,  all of her other labwork was either negative or within normal limits. Patient last received single agent Rituxan in February 2015.  4.  Anxiety: Continue Xanax as needed.  Patient also received Valium in clinic today. 6.  Hypertension: Chronic and unchanged.  Unclear her compliance with her medications.  Continue follow-up and treatment per primary care. 5.  Back pain: Patient does not complain of this today. 6.  Abdominal bloating: Patient does not complain of this today.  MRI results from January 16, 2018 reviewed independently with no distinct etiology was noted, but incidentally a 1.7 cm cystic lesion suspicious for renal neoplasm was reported.  Continue follow-up with primary care.  Consider reimaging in approximately October 2020. 7.  "Shaking all over": Unclear etiology, but based on description patient gives could have possibly been hypoglycemia.  She did not seek medical attention at that time.  She has been instructed if she has any further episodes like this to seek medical care immediately.  Patient expressed understanding and was in agreement with this plan. She also understands that She can call clinic at any time with any questions, concerns, or complaints.   Lloyd Huger, MD   11/22/2018 9:41 AM

## 2018-11-23 LAB — TYPE AND SCREEN
ABO/RH(D): AB POS
Antibody Screen: NEGATIVE
Unit division: 0

## 2018-11-23 LAB — BPAM RBC
Blood Product Expiration Date: 202009222359
ISSUE DATE / TIME: 202009031309
Unit Type and Rh: 6200

## 2018-12-03 ENCOUNTER — Other Ambulatory Visit: Payer: Medicare Other

## 2018-12-05 ENCOUNTER — Ambulatory Visit: Payer: Medicare Other

## 2018-12-05 ENCOUNTER — Ambulatory Visit: Payer: Medicare Other | Admitting: Oncology

## 2018-12-12 ENCOUNTER — Telehealth: Payer: Self-pay | Admitting: *Deleted

## 2018-12-12 NOTE — Telephone Encounter (Signed)
#   90 tabs were ordered on 8/24, but per [facility], only 30 tabs were dispensed due to insurance so patient has 2 refills on current prescription. They will only refill 2 days early, so patient has to call them on Friday for her refill. Jacqueline Russo informed of this and will call them Friday

## 2018-12-24 ENCOUNTER — Other Ambulatory Visit: Payer: Self-pay | Admitting: *Deleted

## 2018-12-24 MED ORDER — HYDROCODONE-ACETAMINOPHEN 5-325 MG PO TABS: 1.0000 | ORAL_TABLET | ORAL | 0 refills | Status: DC | PRN

## 2019-01-02 ENCOUNTER — Other Ambulatory Visit: Payer: Self-pay

## 2019-01-02 ENCOUNTER — Ambulatory Visit
Admission: RE | Admit: 2019-01-02 | Discharge: 2019-01-02 | Disposition: A | Discharge status: Home / Self Care | Payer: Medicare Other | Source: Ambulatory Visit | Attending: Urology | Admitting: Urology

## 2019-01-02 DIAGNOSIS — N2889 Other specified disorders of kidney and ureter: Secondary | ICD-10-CM | POA: Diagnosis present

## 2019-01-02 LAB — POCT I-STAT CREATININE: Creatinine, Ser: 0.9 mg/dL (ref 0.44–1.00)

## 2019-01-02 MED ORDER — GADOBUTROL 1 MMOL/ML IV SOLN
5.0000 mL | Freq: Once | INTRAVENOUS | Status: AC | PRN
  Administered 2019-01-02: 5 mL via INTRAVENOUS

## 2019-01-03 ENCOUNTER — Encounter: Payer: Self-pay | Admitting: Urology

## 2019-01-03 ENCOUNTER — Ambulatory Visit (INDEPENDENT_AMBULATORY_CARE_PROVIDER_SITE_OTHER): Payer: Medicare Other | Admitting: Urology

## 2019-01-03 VITALS — BP 146/70 | HR 93 | Ht 60.0 in | Wt 111.0 lb

## 2019-01-03 DIAGNOSIS — N2889 Other specified disorders of kidney and ureter: Secondary | ICD-10-CM | POA: Diagnosis not present

## 2019-01-03 NOTE — Progress Notes (Signed)
   01/03/2019 4:51 PM   Shakara Darel Hong [birth year] [id number]  Reason for visit: Follow up small renal mass  HPI: I saw Ms. Sandhu back in urology clinic for follow-up of a small right renal mass.  She is an 85 year old female with a history of ovarian cancer, leukemia, and MDS who I originally saw in November 2019 for an incidental finding of an enhancing 1.7 cm right upper pole cystic lesion worrisome for neoplasm.  She had elected for active surveillance, and was adamantly opposed to any biopsy or intervention, which is very reasonable.  Initial follow-up renal ultrasound in May 2020 showed a 2.8 cm mass correlating to the 1.7 cm mass seen on MRI, with the understanding that we could not compare size with the 2 different modalities.  She underwent an additional MRI 01/02/2019 that showed only slight enlargement of the multilocular cystic lesion in the upper pole of the right kidney to 2 cm from 1.7 cm prior.  She continues to deny any symptoms of weight loss, flank pain, or gross hematuria.  Again, she is adamantly opposed to any intervention including biopsy, partial nephrectomy, or ablation.  We reviewed these options again at length, as well as active surveillance, which I agree is the most reasonable option with her age and comorbidities.  We discussed the very low, but not 0, risk of developing metastatic disease while on active surveillance.  We will follow-up in 1 year with renal ultrasound and chest x-ray.  She had significant pain with her MRI yesterday as they had a difficult time getting venous access.  RTC 1 year with renal ultrasound and chest x-ray for active surveillance of small right renal mass  A total of 25 minutes were spent face-to-face with the patient, greater than 50% was spent in patient education, counseling, and coordination of care regarding small renal mass and treatment options.  B[place]ley [place], [facility] [address] [place], [place] [address] [phone]

## 2019-01-03 NOTE — Patient Instructions (Signed)
Kidney Cancer  Kidney cancer is an abnormal growth of cells in one or both kidneys. The kidneys filter waste from your blood and produce urine. Kidney cancer may spread to other parts of your body. This type of cancer may also be called renal cell carcinoma. What are the causes? The cause of this condition is not always known. In some cases, abnormal changes to genes (genetic mutations) can cause cells to form cancer. What increases the risk? You may be more likely to develop kidney cancer if you:  Are over age 60. The risk increases with age.  Have a family history of kidney cancer.  Are of African-American, Native American, or Native [place] descent.  Smoke.  Are female.  Are obese.  Have high blood pressure (hypertension).  Have advanced kidney disease, especially if you need long-term dialysis.  Have certain conditions that are passed from parent to child (inherited), such as von Hippel-Lindau disease, tuberous sclerosis, or hereditary papillary renal carcinoma.  Have been exposed to certain chemicals. What are the signs or symptoms? In the early stages, kidney cancer does not cause symptoms. As the cancer grows, symptoms may include:  Blood in the urine.  Pain in the upper back or abdomen, just below the rib cage. You may feel pain on one or both sides of the body.  Fatigue.  Unexplained weight loss.  Fever. How is this diagnosed? This condition may be diagnosed based on:  Your symptoms and medical history.  A physical exam.  Blood and urine tests.  X-rays.  Imaging tests, such as CT scans, MRIs, and PET scans.  Having dye injected into your blood through an IV, and then having X-rays taken of: ? Your kidneys and the rest of the organs involved in making and storing urine (intravenous pyelogram). ? Your blood vessels (angiogram).  Removal and testing of a kidney tissue sample (biopsy). Your cancer will be assessed (staged), based on how severe it is and how  much it has spread. How is this treated? Treatment depends on the type and stage of the cancer. Treatment may include one or more of the following:  Surgery. This may include surgery to remove: ? Just the tumor (nephron-sparing surgery). ? The entire kidney (nephrectomy). ? The kidney, some of the surrounding healthy tissue, nearby lymph nodes, and the adrenal gland in certain cases (radical nephrectomy).  Medicines that kill cancer cells (chemotherapy).  High-energy rays that kill cancer cells (radiation therapy).  Targeted therapy. This targets specific parts of cancer cells and the area around them to block the growth and the spread of the cancer. Targeted therapy can help to limit the damage to healthy cells.  Medicines that help your body's disease-fighting system (immune system) fight cancer cells (immunotherapy).  Freezing cancer cells using gas or liquid that is delivered through a needle (cryoablation).  Destroying cancer cells using high-energy radio waves that are delivered through a needle-like probe (radiofrequency ablation).  A procedure to block the artery that supplies blood to the tumor, which kills the cancer cells (embolization). Follow these instructions at home: Eating and drinking  Some of your treatments might affect your appetite and your ability to chew and swallow. If you are having problems eating, or if you do not have an appetite, meet with a diet and nutrition specialist (dietitian).  If you have side effects that affect eating, it may help to: ? Eat smaller meals and snacks often. ? Drink high-nutrition and high-calorie shakes or supplements. ? Eat bland and soft   foods that are easy to eat. ? Not eat foods that are hot, spicy, or hard to swallow. Lifestyle  Do not drink alcohol.  Do not use any products that contain nicotine or tobacco, such as cigarettes and e-cigarettes. If you need help quitting, ask your health care provider. General  instructions   Take over-the-counter and prescription medicines only as told by your health care provider. This includes vitamins, supplements, and herbal products.  Consider joining a support group to help you cope with the stress of having kidney cancer.  Work with your health care provider to manage any side effects of treatment.  Keep all follow-up visits as told by your health care provider. This is important. Where to find more information  American Cancer Society: https://www.cancer.org  National Cancer Institute (NCI): https://www.cancer.gov Contact a health care provider if you:  Notice that you bruise or bleed easily.  Are losing weight without trying.  Have new or increased fatigue or weakness. Get help right away if you have:  Blood in your urine.  A sudden increase in pain.  A fever.  Shortness of breath.  Chest pain.  Yellow skin or whites of your eyes (jaundice). Summary  Kidney cancer is an abnormal growth of cells (tumor) in one or both kidneys. Tumors may spread to other parts of your body.  In the early stages, kidney cancer does not cause symptoms. As the cancer grows, symptoms may include blood in the urine, pain in the upper back or abdomen, unexplained weight loss, fatigue, and fever.  Treatment depends on the type and stage of the cancer. It may include surgery to remove the tumor, procedures and medicines to kill the cancer cells, or medicines to help your body fight cancer cells. This information is not intended to replace advice given to you by your health care provider. Make sure you discuss any questions you have with your health care provider. Document Released: 01/16/2004 Document Revised: 03/29/2017 Document Reviewed: 03/26/2017 Elsevier Patient Education  2020 Elsevier Inc.  

## 2019-01-07 ENCOUNTER — Telehealth: Payer: Self-pay | Admitting: *Deleted

## 2019-01-07 NOTE — Telephone Encounter (Signed)
Patient called requesting to speak with Dr Grayland Ormond and NOT a nurse. Please return her call [phone]

## 2019-01-08 NOTE — Telephone Encounter (Signed)
Pt would like to start her infusion immediately when she gets here on Friday, I will see her in infusion. Thanks!

## 2019-01-13 NOTE — Progress Notes (Signed)
[facility]  [phone] [phone]  ID: Jacqueline Russo OB: 12-03-1929  MR#: [id number]  BWL#:[id number]  Patient Care Team: [name], MD as PCP - General (Internal Medicine)  CHIEF COMPLAINT: MDS 5q-, CLL  INTERVAL HISTORY: Patient returns to clinic today for repeat laboratory, further evaluation, and consideration of blood transfusion.  She continues to have chronic weakness and fatigue, but otherwise feels well.  She has no neurologic complaints. She denies any recent fevers or illnesses. She denies any night sweats or weight loss.  She denies any chest pain, shortness of breath, cough, or hemoptysis.  She denies any nausea, vomiting, constipation, or diarrhea. She denies any melena or hematochezia. She has no urinary complaints.  Patient offers no further specific complaints today.  REVIEW OF SYSTEMS:   Review of Systems  Constitutional: Positive for malaise/fatigue. Negative for fever and weight loss.  Respiratory: Negative.  Negative for cough and shortness of breath.   Cardiovascular: Negative.  Negative for chest pain and leg swelling.  Gastrointestinal: Negative.  Negative for abdominal pain, blood in stool and melena.  Genitourinary: Negative.  Negative for dysuria.  Musculoskeletal: Negative.  Negative for back pain and joint pain.  Skin: Negative.  Negative for rash.  Neurological: Positive for weakness. Negative for dizziness, sensory change, focal weakness and headaches.  Endo/Heme/Allergies: Does not bruise/bleed easily.  Psychiatric/Behavioral: The patient is nervous/anxious.     As per HPI. Otherwise, a complete review of systems is negative.  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 No reported history of malignancy or chronic disease.     ADVANCED DIRECTIVES:    HEALTH MAINTENANCE: Social History   Tobacco Use   Smoking status: Former Smoker    Packs/day: 2.00    Types: Cigarettes    Quit date: 1963    Years since quitting: 57.8   Smokeless tobacco: Never Used  Substance Use Topics   Alcohol use: Yes    Alcohol/week: 1.0 standard drinks    Types: 1 Glasses of wine per week    Comment: 1 glass of wine daily   Drug use: No     Colonoscopy:  PAP:  Bone density:  Lipid panel:  No Known Allergies  Current Outpatient Medications  Medication Sig Dispense Refill   ALPRAZolam (XANAX) 0.25 MG tablet Take 1 tablet (0.25 mg total) by mouth daily as needed for anxiety. 90 tablet 0   aspirin 81 MG tablet Take 81 mg by mouth daily.     cyanocobalamin (,VITAMIN B-12,) 1000 MCG/ML injection Inject into the muscle.     diltiazem (CARDIZEM CD) 240 MG 24 hr capsule Take 1 capsule by mouth at bedtime.     diphenhydrAMINE (BENADRYL) 50 MG tablet Take 50 mg by mouth at  bedtime as needed for itching.     diphenhydrAMINE (SOMINEX) 25 MG tablet Take 25 mg by mouth at bedtime.     HYDROcodone-acetaminophen (NORCO/VICODIN) 5-325 MG tablet Take 1-2 tablets by mouth every 4 (four) hours as needed for moderate pain or severe pain. 90 tablet 0   lidocaine-prilocaine (EMLA) cream Apply 1 application topically as needed. Apply to port 1-2 hours prior to appointment. Cover with plastic wrap. 30 g 0   Multiple Vitamins-Minerals (PRESERVISION AREDS  2+MULTI VIT PO) Take 1 tablet by mouth 1 day or 1 dose.     olmesartan (BENICAR) 20 MG tablet Take by mouth.     tiZANidine (ZANAFLEX) 2 MG tablet Take 1 tablet (2 mg total) by mouth at bedtime as needed for muscle spasms. 30 tablet 0   No current facility-administered medications for this visit.    Facility-Administered Medications Ordered in Other Visits  Medication Dose Route Frequency Provider Last Rate Last Dose   heparin lock flush 100 unit/mL  500 Units Intravenous Once Lloyd Huger, MD       sodium chloride flush (NS) 0.9 % injection 10 mL  10 mL Intravenous PRN Lloyd Huger, MD   10 mL at 09/05/18 8588    OBJECTIVE: There were no vitals filed for this visit.   There is no height or weight on file to calculate BMI.    ECOG FS:1 - Symptomatic but completely ambulatory  General: Thin, no acute distress. Eyes: Pink conjunctiva, anicteric sclera. HEENT: Normocephalic, moist mucous membranes. Lungs: Clear to auscultation bilaterally. Heart: Regular rate and rhythm. No rubs, murmurs, or gallops. Abdomen: Soft, nontender, nondistended. No organomegaly noted, normoactive bowel sounds. Musculoskeletal: No edema, cyanosis, or clubbing. Neuro: Alert, answering all questions appropriately. Cranial nerves grossly intact. Skin: No rashes or petechiae noted. Psych: Normal affect.  LAB RESULTS:  Lab Results  Component Value Date   NA 130 (L) 03/16/2017   K 3.3 (L) 03/16/2017   CL 97 (L) 03/16/2017   CO2 22 03/16/2017   GLUCOSE 147 (H) 03/16/2017   BUN 19 03/16/2017   CREATININE 0.90 01/02/2019   CALCIUM 9.5 03/16/2017   PROT 7.3 03/27/2013   ALBUMIN 4.1 03/27/2013   AST 12 (L) 03/27/2013   ALT 20 03/27/2013   ALKPHOS 87 03/27/2013   BILITOT 0.5 03/27/2013   GFRNONAA 57 (L) 03/16/2017   GFRAA >60 03/16/2017    Lab Results  Component Value Date   WBC 4.0 01/16/2019   NEUTROABS 2.4 01/16/2019   HGB 7.7 (L) 01/16/2019   HCT 22.2 (L) 01/16/2019   MCV 113.3  (H) 01/16/2019   PLT 195 01/16/2019   Lab Results  Component Value Date   IRON 129 01/16/2019   TIBC 322 01/16/2019   IRONPCTSAT 40 (H) 01/16/2019   Lab Results  Component Value Date   FERRITIN 1,063 (H) 01/16/2019     STUDIES: Mr Abdomen W Wo Contrast  Result Date: 01/02/2019 CLINICAL DATA:  85 year old female with history of renal mass. Follow-up study. EXAM: MRI ABDOMEN WITHOUT AND WITH CONTRAST TECHNIQUE: Multiplanar multisequence MR imaging of the abdomen was performed both before and after the administration of intravenous contrast. CONTRAST:  72m GADAVIST GADOBUTROL 1 MMOL/ML IV SOLN COMPARISON:  Abdominal MRI 01/16/2018. FINDINGS: Lower chest: Visualized portions are unremarkable. Hepatobiliary: Diffuse loss of signal intensity throughout the hepatic parenchyma on out of phase dual echo images, indicative of a background of hepatic steatosis. Multiple T1 hypointense, T2 hyperintense, nonenhancing lesions in the liver, compatible with simple cysts, largest  of which measures 4.8 x 3.8 cm in segment 7 of the liver. Small hypervascular lesion in segment 2 of the liver (axial image 26 of series 15) measuring 1.0 x 0.8 cm which appears to be fed by a portal vein branch and drained by a hepatic vein branch, compatible with a small portohepatic shunt. No other suspicious hepatic lesions. No intra or extrahepatic biliary ductal dilatation. No filling defects in the gallbladder. Gallbladder is normal in appearance. Pancreas: No pancreatic mass. Mild diffuse pancreatic ductal dilatation measuring up to 5 mm in the head of the pancreas, similar to the prior study. No peripancreatic fluid collections or inflammatory changes. Spleen:  Unremarkable. Adrenals/Urinary Tract: When compared to the prior examination the previously noted multilocular cystic lesion in the upper pole of the right kidney has enlarged, currently measuring 1.8 x 1.8 x 2.0 cm (axial image 48 of series 17 and coronal image 33 of  series 21). Multiple areas of mural thickening and thick internal enhancing septations are again noted within this lesion. This lesion remains encapsulated within Gerota's fascia, and is well separated from the right renal vein which is widely patent. No other suspicious renal lesions. Multiple other T1 hyperintense, T2 hypointense, nonenhancing lesions in the right kidney, compatible with proteinaceous/hemorrhagic cysts, largest of which is in the lower pole measuring 1.5 cm (axial image 62 of series 14). Other T1 hypointense, T2 hyperintense, nonenhancing lesion in the interpolar region of the left kidney measuring 3 cm in diameter, compatible with a simple cyst. No hydroureteronephrosis in the visualized portions of the abdomen. Bilateral adrenal glands are normal in appearance. Stomach/Bowel: Visualized portions are unremarkable. Vascular/Lymphatic: Aortic atherosclerosis, without evidence of aneurysm in the abdominal vasculature. No lymphadenopathy noted in the abdomen. Other: No significant volume of ascites noted in the visualized portions of the peritoneal cavity. Musculoskeletal: No aggressive appearing osseous lesions are noted in the visualized portions of the skeleton. IMPRESSION: 1. Continued growth of multilocular thick-walled cystic neoplasm in the upper pole of the right kidney, highly concerning for enlarging renal cell carcinoma. This remains encapsulated within Gerota's fascia, is separate from the right renal vein which is patent, and is not associated with definite lymphadenopathy or signs of metastatic disease in the abdomen or pelvis at this time. 2. Hepatic steatosis. 3. Aortic atherosclerosis. 4. Additional incidental findings, similar to the prior study, as above. Electronically Signed   By: Vinnie Langton M.D.   On: 01/02/2019 09:24    ASSESSMENT: MDS 5q-, CLL   PLAN:    1. MDS, 5q-: Bone marrow biopsy results from June 30, 2017 reviewed independently confirming progression of  patient's MDS, 5q-. She has a hypercellular marrow for age as well as an increased blast count up to 6%.  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  Patient continues to decline treatment with Revlimid.  Previously, we also discussed the possibility of progressing to AML, but patient wishes no further interventions other than periodic blood transfusions.  Although patient's hemoglobin is 7.7, she is symptomatic therefore will proceed with 1 unit of packed red blood cells today.  Patient also should receive Desferal with every infusion.  Return to clinic in 8 weeks for laboratory work on day 1 and then blood/Desferal on day 2. 2.  Elevated iron stores: Chronic and unchanged.  Desferal as above.   3.  CLL: Bone marrow biopsy did not mention any involvement of CLL.  White blood cell count is within normal limits today.  CT scan results  from June 09, 2014 were independently reviewed and did not reveal any evidence of recurrent disease.  Previously, all of her other labwork was either negative or within normal limits. Patient last received single agent Rituxan in February 2015.  4.  Anxiety: Continue Xanax as needed.  Patient also received Valium in clinic today. 6.  Hypertension: Chronic and unchanged.  Unclear her compliance with her medications.  Continue follow-up and treatment per primary care. 5.  Back pain: Patient does not complain of this today. 6.  Abdominal bloating: Patient does not complain of this today.  MRI results from January 16, 2018 reviewed independently with no distinct etiology was noted, but incidentally a 1.7 cm cystic lesion suspicious for renal neoplasm was reported.  Continue follow-up with primary care.  Consider reimaging in approximately October 2020.   Patient expressed understanding and was in agreement with this plan. She also understands that She can call clinic at any time with any questions, concerns, or complaints.   Lloyd Huger, MD   01/18/2019 6:35 AM

## 2019-01-15 ENCOUNTER — Other Ambulatory Visit: Payer: Self-pay

## 2019-01-16 ENCOUNTER — Inpatient Hospital Stay: Payer: Medicare Other | Attending: Oncology

## 2019-01-16 ENCOUNTER — Other Ambulatory Visit: Payer: Self-pay | Admitting: Oncology

## 2019-01-16 ENCOUNTER — Telehealth: Payer: Self-pay

## 2019-01-16 ENCOUNTER — Other Ambulatory Visit: Payer: Self-pay

## 2019-01-16 DIAGNOSIS — D469 Myelodysplastic syndrome, unspecified: Secondary | ICD-10-CM

## 2019-01-16 DIAGNOSIS — D649 Anemia, unspecified: Secondary | ICD-10-CM

## 2019-01-16 DIAGNOSIS — Z7982 Long term (current) use of aspirin: Secondary | ICD-10-CM | POA: Insufficient documentation

## 2019-01-16 DIAGNOSIS — C9110 Chronic lymphocytic leukemia of B-cell type not having achieved remission: Secondary | ICD-10-CM | POA: Insufficient documentation

## 2019-01-16 DIAGNOSIS — M549 Dorsalgia, unspecified: Secondary | ICD-10-CM | POA: Diagnosis not present

## 2019-01-16 DIAGNOSIS — F419 Anxiety disorder, unspecified: Secondary | ICD-10-CM | POA: Insufficient documentation

## 2019-01-16 DIAGNOSIS — F17210 Nicotine dependence, cigarettes, uncomplicated: Secondary | ICD-10-CM | POA: Insufficient documentation

## 2019-01-16 DIAGNOSIS — I10 Essential (primary) hypertension: Secondary | ICD-10-CM | POA: Diagnosis not present

## 2019-01-16 DIAGNOSIS — Z79899 Other long term (current) drug therapy: Secondary | ICD-10-CM | POA: Insufficient documentation

## 2019-01-16 LAB — CBC WITH DIFFERENTIAL/PLATELET
Abs Immature Granulocytes: 0.03 10*3/uL (ref 0.00–0.07)
Basophils Absolute: 0.1 10*3/uL (ref 0.0–0.1)
Basophils Relative: 1 %
Eosinophils Absolute: 0.1 10*3/uL (ref 0.0–0.5)
Eosinophils Relative: 3 %
HCT: 22.2 % — ABNORMAL LOW (ref 36.0–46.0)
Hemoglobin: 7.7 g/dL — ABNORMAL LOW (ref 12.0–15.0)
Immature Granulocytes: 1 %
Lymphocytes Relative: 22 %
Lymphs Abs: 0.9 10*3/uL (ref 0.7–4.0)
MCH: 39.3 pg — ABNORMAL HIGH (ref 26.0–34.0)
MCHC: 34.7 g/dL (ref 30.0–36.0)
MCV: 113.3 fL — ABNORMAL HIGH (ref 80.0–100.0)
Monocytes Absolute: 0.5 10*3/uL (ref 0.1–1.0)
Monocytes Relative: 12 %
Neutro Abs: 2.4 10*3/uL (ref 1.7–7.7)
Neutrophils Relative %: 61 %
Platelets: 195 10*3/uL (ref 150–400)
RBC: 1.96 MIL/uL — ABNORMAL LOW (ref 3.87–5.11)
RDW: 18 % — ABNORMAL HIGH (ref 11.5–15.5)
Smear Review: NORMAL
WBC: 4 10*3/uL (ref 4.0–10.5)
nRBC: 0 % (ref 0.0–0.2)

## 2019-01-16 LAB — SAMPLE TO BLOOD BANK

## 2019-01-16 LAB — IRON AND TIBC
Iron: 129 ug/dL (ref 28–170)
Saturation Ratios: 40 % — ABNORMAL HIGH (ref 10.4–31.8)
TIBC: 322 ug/dL (ref 250–450)
UIBC: 193 ug/dL

## 2019-01-16 LAB — FERRITIN: Ferritin: 1063 ng/mL — ABNORMAL HIGH (ref 11–307)

## 2019-01-16 NOTE — Telephone Encounter (Signed)
Spoke with patient and she is ok with getting blood tomorrow she feels she really needs this and knows to go straight to infusion and Grayland Ormond will see her there.

## 2019-01-16 NOTE — Progress Notes (Signed)
Patient pre screened for office appointment, no questions or concerns today. 

## 2019-01-17 ENCOUNTER — Inpatient Hospital Stay (HOSPITAL_BASED_OUTPATIENT_CLINIC_OR_DEPARTMENT_OTHER): Payer: Medicare Other | Admitting: Oncology

## 2019-01-17 ENCOUNTER — Other Ambulatory Visit: Payer: Self-pay | Admitting: Oncology

## 2019-01-17 ENCOUNTER — Other Ambulatory Visit: Payer: Self-pay

## 2019-01-17 ENCOUNTER — Inpatient Hospital Stay: Payer: Medicare Other

## 2019-01-17 DIAGNOSIS — I6523 Occlusion and stenosis of bilateral carotid arteries: Secondary | ICD-10-CM | POA: Diagnosis not present

## 2019-01-17 DIAGNOSIS — D469 Myelodysplastic syndrome, unspecified: Secondary | ICD-10-CM

## 2019-01-17 DIAGNOSIS — C9110 Chronic lymphocytic leukemia of B-cell type not having achieved remission: Secondary | ICD-10-CM | POA: Diagnosis not present

## 2019-01-17 LAB — PREPARE RBC (CROSSMATCH)

## 2019-01-17 MED ORDER — HEPARIN SOD (PORK) LOCK FLUSH 100 UNIT/ML IV SOLN
500.0000 [IU] | Freq: Every day | INTRAVENOUS | Status: AC | PRN
  Administered 2019-01-17: 500 [IU]
  Filled 2019-01-17 (×2): qty 5

## 2019-01-17 MED ORDER — SODIUM CHLORIDE 0.9% FLUSH
10.0000 mL | INTRAVENOUS | Status: AC | PRN
  Administered 2019-01-17: 10 mL
  Filled 2019-01-17: qty 10

## 2019-01-17 MED ORDER — SODIUM CHLORIDE 0.9 % IV SOLN
15.0000 mg/kg/h | Freq: Once | INTRAVENOUS | Status: AC
  Administered 2019-01-17: 15 mg/kg/h via INTRAVENOUS
  Filled 2019-01-17: qty 2

## 2019-01-17 MED ORDER — SODIUM CHLORIDE 0.9 % IV SOLN
15.0000 mg/kg/h | Freq: Once | INTRAVENOUS | Status: DC
  Filled 2019-01-17: qty 2

## 2019-01-17 MED ORDER — ACETAMINOPHEN 325 MG PO TABS
650.0000 mg | ORAL_TABLET | Freq: Once | ORAL | Status: AC
  Administered 2019-01-17: 650 mg via ORAL
  Filled 2019-01-17: qty 2

## 2019-01-17 MED ORDER — DIAZEPAM 5 MG PO TABS
5.0000 mg | ORAL_TABLET | Freq: Once | ORAL | Status: AC
  Administered 2019-01-17: 5 mg via ORAL
  Filled 2019-01-17: qty 1

## 2019-01-17 MED ORDER — SODIUM CHLORIDE 0.9% IV SOLUTION
250.0000 mL | Freq: Once | INTRAVENOUS | Status: AC
  Administered 2019-01-17: 250 mL via INTRAVENOUS
  Filled 2019-01-17: qty 250

## 2019-01-17 MED ORDER — DIPHENHYDRAMINE HCL 50 MG/ML IJ SOLN
25.0000 mg | Freq: Once | INTRAMUSCULAR | Status: AC
  Administered 2019-01-17: 25 mg via INTRAVENOUS
  Filled 2019-01-17: qty 1

## 2019-01-18 LAB — TYPE AND SCREEN
ABO/RH(D): AB POS
Antibody Screen: NEGATIVE
Unit division: 0
Unit division: 0

## 2019-01-18 LAB — BPAM RBC
Blood Product Expiration Date: 202010292359
Blood Product Expiration Date: 202011092359
ISSUE DATE / TIME: 202010291315
Unit Type and Rh: 600
Unit Type and Rh: 9500

## 2019-02-07 ENCOUNTER — Other Ambulatory Visit: Payer: Self-pay | Admitting: *Deleted

## 2019-02-07 MED ORDER — HYDROCODONE-ACETAMINOPHEN 5-325 MG PO TABS: 1.0000 | ORAL_TABLET | ORAL | 0 refills | Status: DC | PRN

## 2019-02-11 ENCOUNTER — Other Ambulatory Visit: Payer: Self-pay | Admitting: *Deleted

## 2019-02-11 MED ORDER — ALPRAZOLAM 0.25 MG PO TABS: 0.2500 mg | ORAL_TABLET | Freq: Every day | ORAL | 0 refills | Status: DC | PRN

## 2019-02-18 ENCOUNTER — Encounter: Payer: Self-pay | Admitting: Nurse Practitioner

## 2019-02-18 ENCOUNTER — Other Ambulatory Visit: Payer: Self-pay

## 2019-02-18 ENCOUNTER — Inpatient Hospital Stay: Payer: Medicare Other | Attending: Nurse Practitioner | Admitting: Nurse Practitioner

## 2019-02-18 ENCOUNTER — Telehealth: Payer: Self-pay | Admitting: *Deleted

## 2019-02-18 VITALS — BP 193/69 | HR 84 | Temp 99.3°F | Resp 18 | Wt 107.0 lb

## 2019-02-18 DIAGNOSIS — Z79899 Other long term (current) drug therapy: Secondary | ICD-10-CM | POA: Insufficient documentation

## 2019-02-18 DIAGNOSIS — I10 Essential (primary) hypertension: Secondary | ICD-10-CM

## 2019-02-18 DIAGNOSIS — Z90710 Acquired absence of both cervix and uterus: Secondary | ICD-10-CM | POA: Diagnosis not present

## 2019-02-18 DIAGNOSIS — Z8543 Personal history of malignant neoplasm of ovary: Secondary | ICD-10-CM | POA: Diagnosis not present

## 2019-02-18 DIAGNOSIS — R42 Dizziness and giddiness: Secondary | ICD-10-CM | POA: Diagnosis not present

## 2019-02-18 DIAGNOSIS — Z87891 Personal history of nicotine dependence: Secondary | ICD-10-CM | POA: Diagnosis not present

## 2019-02-18 DIAGNOSIS — Z95828 Presence of other vascular implants and grafts: Secondary | ICD-10-CM

## 2019-02-18 DIAGNOSIS — D469 Myelodysplastic syndrome, unspecified: Secondary | ICD-10-CM | POA: Insufficient documentation

## 2019-02-18 DIAGNOSIS — Z7982 Long term (current) use of aspirin: Secondary | ICD-10-CM | POA: Diagnosis not present

## 2019-02-18 NOTE — Telephone Encounter (Signed)
smc please.

## 2019-02-18 NOTE — Telephone Encounter (Signed)
Patient called reporting that she has soreness and lumps over her port site and is asking what she should do , come in? Please advise

## 2019-02-18 NOTE — Progress Notes (Signed)
Symptom Management [place]  Telephone:([phone] Fax:([phone]) [phone]  Patient Care Team: [name], MD as PCP - General (Internal Medicine)   Name of the patient: Jacqueline Russo  [id number]  03-07-1930   Date of visit: 02/18/19  Diagnosis-MDS  Chief complaint/ Reason for visit-nodularity at port site  Interval history- Jacqueline Russo, 85 year old female with history of MDS, currently transfusion dependent, presents to symptom management clinic for nodularity at port site.  She says that approximately 3 days ago after showering she noticed 3 nodules overlying her port site.  Describes this hard, nonmovable.  Not painful.  No surrounding erythema, swelling, or drainage.  She has had her port for approximately 2 years.  She is concerned of possible infection.  Reports that she has intermittent dizziness which is been ongoing.  She is very anxious and has not been sleeping due to concern of possibly needing her port removed due to acute issue.  Otherwise she feels well and denies specific complaints.  ECOG FS:1 - Symptomatic but completely ambulatory  Review of systems- Review of Systems  Constitutional: Positive for weight loss (approximately 3 lbs). Negative for chills, fever and malaise/fatigue.  HENT: Negative for hearing loss, nosebleeds, sore throat and tinnitus.   Eyes: Negative for blurred vision and double vision.  Respiratory: Negative for cough, hemoptysis, shortness of breath and wheezing.   Cardiovascular: Negative for chest pain, palpitations and leg swelling.  Gastrointestinal: Negative for abdominal pain, blood in stool, constipation, diarrhea, melena, nausea and vomiting.  Genitourinary: Negative for dysuria and urgency.  Musculoskeletal: Negative for back pain, falls, joint pain and myalgias.  Skin: Negative for itching and rash.  Neurological: Positive for dizziness (intermittent). Negative for tingling, sensory change, loss of  consciousness, weakness and headaches.  Endo/Heme/Allergies: Negative for environmental allergies. Does not bruise/bleed easily.  Psychiatric/Behavioral: Negative for depression. The patient is not nervous/anxious and does not have insomnia.     Current treatment-transfusion dependent  No Known Allergies  Past Medical History:  Diagnosis Date  . Anemia    Myelodysplastic syndrome  . Arthritis   . Hypertension   . Leukemia, chronic lymphoid (HCC)    CLL  . Ovarian cancer ([place]) 1963    Past Surgical History:  Procedure Laterality Date  . ABDOMINAL HYSTERECTOMY    . APPENDECTOMY    . BREAST SURGERY     cyst removed from right breast  . EYE SURGERY Bilateral    Cataract Extractions  . IR FLUORO GUIDE CV LINE RIGHT  06/30/2017  . REVERSE SHOULDER ARTHROPLASTY Right 10/28/2014   Procedure: REVERSE SHOULDER ARTHROPLASTY;  Surgeon: Corky Mull, MD;  Location: [facility] ORS;  Service: Orthopedics;  Laterality: Right;    Social History   Socioeconomic History  . Marital status: Widowed    Spouse name: Not on file  . Number of children: Not on file  . Years of education: Not on file  . Highest education level: Not on file  Occupational History  . Not on file  Social Needs  . Financial resource strain: Not on file  . Food insecurity    Worry: Not on file    Inability: Not on file  . Transportation needs    Medical: Not on file    Non-medical: Not on file  Tobacco Use  . Smoking status: Former Smoker    Packs/day: 2.00    Types: Cigarettes    Quit date: 1963    Years since quitting: 57.9  . Smokeless tobacco: Never  Used  Substance and Sexual Activity  . Alcohol use: Yes    Alcohol/week: 1.0 standard drinks    Types: 1 Glasses of wine per week    Comment: 1 glass of wine daily  . Drug use: No  . Sexual activity: Not on file  Lifestyle  . Physical activity    Days per week: Not on file    Minutes per session: Not on file  . Stress: Not on file  Relationships  . Social  Herbalist on phone: Not on file    Gets together: Not on file    Attends religious service: Not on file    Active member of club or organization: Not on file    Attends meetings of clubs or organizations: Not on file    Relationship status: Not on file  . Intimate partner violence    Fear of current or ex partner: Not on file    Emotionally abused: Not on file    Physically abused: Not on file    Forced sexual activity: Not on file  Other Topics Concern  . Not on file  Social History Narrative  . Not on file    No family history on file.   Current Outpatient Medications:  .  ALPRAZolam (XANAX) 0.25 MG tablet, Take 1 tablet (0.25 mg total) by mouth daily as needed for anxiety., Disp: 90 tablet, Rfl: 0 .  aspirin 81 MG tablet, Take 81 mg by mouth daily., Disp: , Rfl:  .  cyanocobalamin (,VITAMIN B-12,) 1000 MCG/ML injection, Inject into the muscle., Disp: , Rfl:  .  diltiazem (CARDIZEM CD) 240 MG 24 hr capsule, Take 1 capsule by mouth at bedtime., Disp: , Rfl:  .  diphenhydrAMINE (BENADRYL) 50 MG tablet, Take 50 mg by mouth at bedtime as needed for itching., Disp: , Rfl:  .  diphenhydrAMINE (SOMINEX) 25 MG tablet, Take 25 mg by mouth at bedtime., Disp: , Rfl:  .  HYDROcodone-acetaminophen (NORCO/VICODIN) 5-325 MG tablet, Take 1-2 tablets by mouth every 4 (four) hours as needed for moderate pain or severe pain., Disp: 90 tablet, Rfl: 0 .  Multiple Vitamins-Minerals (PRESERVISION AREDS 2+MULTI VIT PO), Take 1 tablet by mouth 1 day or 1 dose., Disp: , Rfl:  .  olmesartan (BENICAR) 20 MG tablet, Take by mouth 2 (two) times daily. , Disp: , Rfl:  .  tiZANidine (ZANAFLEX) 2 MG tablet, Take 1 tablet (2 mg total) by mouth at bedtime as needed for muscle spasms., Disp: 30 tablet, Rfl: 0 .  lidocaine-prilocaine (EMLA) cream, Apply 1 application topically as needed. Apply to port 1-2 hours prior to appointment. Cover with plastic wrap. (Patient not taking: Reported on 02/18/2019), Disp:  30 g, Rfl: 0 No current facility-administered medications for this visit.   Facility-Administered Medications Ordered in Other Visits:  .  heparin lock flush 100 unit/mL, 500 Units, Intravenous, Once, Finnegan, Kathlene November, MD .  sodium chloride flush (NS) 0.9 % injection 10 mL, 10 mL, Intravenous, PRN, Lloyd Huger, MD, 10 mL at 09/05/18 0822  Physical exam:  Vitals:   02/18/19 1135 02/18/19 1136 02/18/19 1142  BP:  (!) 193/69   Pulse:  84 84  Resp: 18 18   Temp: 99.3 F (37.4 C) 99.3 F (37.4 C)   TempSrc: Tympanic Tympanic   SpO2:   98%  Weight: 107 lb (48.5 kg)     Physical Exam Constitutional:      General: She is not in acute distress. Cardiovascular:  Rate and Rhythm: Normal rate and regular rhythm.  Pulmonary:     Effort: No respiratory distress.  Skin:    General: Skin is warm and dry.     Comments: Right upper chest- port in place. Nodularity consistent with normal appearing port. No evidence of secondary infection, erythema, swelling, or drainage.   Neurological:     Mental Status: She is alert and oriented to person, place, and time.     Gait: Gait normal.  Psychiatric:        Behavior: Behavior normal.      CMP Latest Ref Rng & Units 01/02/2019  Glucose 65 - 99 mg/dL -  BUN 6 - 20 mg/dL -  Creatinine 0.44 - 1.00 mg/dL 0.90  Sodium 135 - 145 mmol/L -  Potassium 3.5 - 5.1 mmol/L -  Chloride 101 - 111 mmol/L -  CO2 22 - 32 mmol/L -  Calcium 8.9 - 10.3 mg/dL -  Total Protein 6.4 - 8.2 g/dL -  Total Bilirubin 0.2 - 1.0 mg/dL -  Alkaline Phos Unit/L -  AST 15 - 37 Unit/L -  ALT 12 - 78 U/L -   CBC Latest Ref Rng & Units 01/16/2019  WBC 4.0 - 10.5 K/uL 4.0  Hemoglobin 12.0 - 15.0 g/dL 7.7(L)  Hematocrit 36.0 - 46.0 % 22.2(L)  Platelets 150 - 400 K/uL 195    No images are attached to the encounter.  No results found.  Assessment and plan- Patient is a 85 y.o. female diagnosed with MDS, transfusion dependent, who presents to symptom  management clinic for evaluation of nodularity at port site.  1.  Port-A-Cath-palpable nodules are secondary normal architecture of Mediport, likely more noticeable now due to weight loss.  Discussed signs and symptoms of infection.  Continue to monitor.  2.  Hypertension- BP 193/69 in clinic.  Patient reports intermittent dizziness.  Advised patient to go to ER to have work-up which she declined.  She says that she has had significant hypertension for many years and this is managed by her PCP.  She refuses ER.  It appears that it has been in the normal range when she is at her PCPs office.  I reviewed previous notes and it appears there has been lengthy discussions regarding risks versus benefits of up titration of her blood pressure medications.  She vehemently declines further intervention today and says she is aware of consequences.  I encouraged her to call her PCP to advise him.  Disposition: Return to clinic as needed.  Continue follow-up with Dr. Grayland Ormond for MDS   Visit Diagnosis 1. Essential hypertension   2. Port-A-Cath in place     Patient expressed understanding and was in agreement with this plan. She also understands that She can call clinic at any time with any questions, concerns, or complaints.   Thank you for allowing me to participate in the care of this very pleasant patient.   Beckey Rutter, DNP, AGNP-C [place] at [facility] [place] [phone] (clinic)  CC: Dr. Grayland Ormond, Dr. Emily Filbert

## 2019-02-18 NOTE — Telephone Encounter (Signed)
Patient accepts 1130 appointment for Symptom Management Clinic

## 2019-03-11 ENCOUNTER — Other Ambulatory Visit: Payer: Self-pay | Admitting: *Deleted

## 2019-03-11 MED ORDER — ALPRAZOLAM 0.25 MG PO TABS: 0.2500 mg | ORAL_TABLET | Freq: Every day | ORAL | 0 refills | Status: DC | PRN

## 2019-03-13 NOTE — Progress Notes (Signed)
[place]  Telephone:(336) [phone] [phone]) [phone]  ID: Chaney Born OB: 06-13-1929  MR#: [id number]  JJO#:[id number]  Patient Care Team: [name], MD as PCP - General (Internal Medicine) Lloyd Huger, MD as Consulting Physician (Hematology and Oncology)  CHIEF COMPLAINT: MDS 5q-, CLL  INTERVAL HISTORY: Patient returns to clinic today for repeat laboratory work, further evaluation, and consideration of Desferal and packed red blood cells.  Patient states she recently finished treatment for UTI, but also had difficulty with the initial antibiotic prescribed.  She also had a recent fall causing ecchymosis on her right face, but no other injury.  She has significant weakness and fatigue today.  She has no neurologic complaints. She denies any recent fevers or illnesses. She denies any night sweats or weight loss.  She denies any chest pain, shortness of breath, cough, or hemoptysis.  She denies any nausea, vomiting, constipation, or diarrhea. She denies any melena or hematochezia. She has no urinary complaints.  Patient offers no further specific complaints today.  REVIEW OF SYSTEMS:   Review of Systems  Constitutional: Positive for malaise/fatigue. Negative for fever and weight loss.  Eyes: Negative.  Negative for blurred vision, double vision and pain.  Respiratory: Negative.  Negative for cough and shortness of breath.   Cardiovascular: Negative.  Negative for chest pain and leg swelling.  Gastrointestinal: Negative.  Negative for abdominal pain, blood in stool and melena.  Genitourinary: Negative.  Negative for dysuria.  Musculoskeletal: Negative.  Negative for back pain and joint pain.  Skin: Negative.  Negative for rash.  Neurological: Positive for weakness. Negative for dizziness, sensory change, focal weakness and headaches.  Endo/Heme/Allergies: Does not bruise/bleed easily.  Psychiatric/Behavioral: Negative.  The patient is not nervous/anxious.       As per HPI. Otherwise, a complete review of systems is negative.  PAST MEDICAL HISTORY: Past Medical History:  Diagnosis Date   Anemia    Myelodysplastic syndrome   Arthritis    Hypertension    Leukemia, chronic lymphoid ([place])    CLL   Ovarian cancer ([place]) 1963    PAST SURGICAL HISTORY: Past Surgical History:  Procedure Laterality Date   ABDOMINAL HYSTERECTOMY     APPENDECTOMY     BREAST SURGERY     cyst removed from right breast   EYE SURGERY Bilateral    Cataract Extractions   IR FLUORO GUIDE CV LINE RIGHT  06/30/2017   REVERSE SHOULDER ARTHROPLASTY Right 10/28/2014   Procedure: REVERSE SHOULDER ARTHROPLASTY;  Surgeon: Corky Mull, MD;  Location: [facility] ORS;  Service: Orthopedics;  Laterality: Right;    FAMILY HISTORY: Reviewed and unchanged. No reported history of malignancy or chronic disease.     ADVANCED DIRECTIVES:    HEALTH MAINTENANCE: Social History   Tobacco Use   Smoking status: Former Smoker    Packs/day: 2.00    Types: Cigarettes    Quit date: 1963    Years since quitting: 58.0   Smokeless tobacco: Never Used  Substance Use Topics   Alcohol use: Yes    Alcohol/week: 1.0 standard drinks    Types: 1 Glasses of wine per week    Comment: 1 glass of wine daily   Drug use: No     Colonoscopy:  PAP:  Bone density:  Lipid panel:  No Known Allergies  Current Outpatient Medications  Medication Sig Dispense Refill   ALPRAZolam (XANAX) 0.25 MG tablet Take 1 tablet (0.25 mg total) by mouth daily as needed for anxiety. 90 tablet  0   aspirin 81 MG tablet Take 81 mg by mouth daily.     cyanocobalamin (,VITAMIN B-12,) 1000 MCG/ML injection Inject into the muscle.     diltiazem (CARDIZEM CD) 240 MG 24 hr capsule Take 1 capsule by mouth at bedtime.     diphenhydrAMINE (BENADRYL) 50 MG tablet Take 50 mg by mouth at bedtime as needed for itching.     diphenhydrAMINE (SOMINEX) 25 MG tablet Take 25 mg by mouth at bedtime.      HYDROcodone-acetaminophen (NORCO/VICODIN) 5-325 MG tablet Take 1-2 tablets by mouth every 4 (four) hours as needed for moderate pain or severe pain. 90 tablet 0   lidocaine-prilocaine (EMLA) cream Apply 1 application topically as needed. Apply to port 1-2 hours prior to appointment. Cover with plastic wrap. 30 g 0   Multiple Vitamins-Minerals (PRESERVISION AREDS 2+MULTI VIT PO) Take 1 tablet by mouth 1 day or 1 dose.     olmesartan (BENICAR) 20 MG tablet Take by mouth 2 (two) times daily.      tiZANidine (ZANAFLEX) 2 MG tablet Take 1 tablet (2 mg total) by mouth at bedtime as needed for muscle spasms. 30 tablet 0   Current Facility-Administered Medications  Medication Dose Route Frequency Provider Last Rate Last Admin   [START ON 04/24/2019] deferoxamine (DESFERAL) 2,000 mg in sodium chloride 0.9 % 250 mL infusion  15 mg/kg/hr Intravenous UD Grayland Ormond, Kathlene November, MD       Facility-Administered Medications Ordered in Other Visits  Medication Dose Route Frequency Provider Last Rate Last Admin   0.9 %  sodium chloride infusion (Manually program via Guardrails IV Fluids)  250 mL Intravenous Once Lloyd Huger, MD       acetaminophen (TYLENOL) tablet 650 mg  650 mg Oral Once Lloyd Huger, MD       diphenhydrAMINE (BENADRYL) injection 25 mg  25 mg Intravenous Once Lloyd Huger, MD       heparin lock flush 100 unit/mL  500 Units Intravenous Once Lloyd Huger, MD       heparin lock flush 100 unit/mL  500 Units Intracatheter Daily PRN Lloyd Huger, MD       sodium chloride flush (NS) 0.9 % injection 10 mL  10 mL Intravenous PRN Lloyd Huger, MD   10 mL at 09/05/18 3570    OBJECTIVE: There were no vitals filed for this visit.   There is no height or weight on file to calculate BMI.    ECOG FS:1 - Symptomatic but completely ambulatory  General: Thin, no acute distress. Eyes: Pink conjunctiva, anicteric sclera.  Right eye with large ecchymosis. HEENT:  Normocephalic, moist mucous membranes. Lungs: No audible wheezing or coughing. Heart: Regular rate and rhythm. Abdomen: Soft, nontender, no obvious distention. Musculoskeletal: No edema, cyanosis, or clubbing. Neuro: Alert, answering all questions appropriately. Cranial nerves grossly intact. Skin: No rashes or petechiae noted. Psych: Normal affect.   LAB RESULTS:  Lab Results  Component Value Date   NA 130 (L) 03/16/2017   K 3.3 (L) 03/16/2017   CL 97 (L) 03/16/2017   CO2 22 03/16/2017   GLUCOSE 147 (H) 03/16/2017   BUN 19 03/16/2017   CREATININE 0.90 01/02/2019   CALCIUM 9.5 03/16/2017   PROT 7.3 03/27/2013   ALBUMIN 4.1 03/27/2013   AST 12 (L) 03/27/2013   ALT 20 03/27/2013   ALKPHOS 87 03/27/2013   BILITOT 0.5 03/27/2013   GFRNONAA 57 (L) 03/16/2017   GFRAA >60 03/16/2017    Lab  Results  Component Value Date   WBC 6.3 03/19/2019   NEUTROABS 4.6 03/19/2019   HGB 6.8 (L) 03/19/2019   HCT 20.4 (L) 03/19/2019   MCV 114.0 (H) 03/19/2019   PLT 274 03/19/2019   Lab Results  Component Value Date   IRON 129 01/16/2019   TIBC 322 01/16/2019   IRONPCTSAT 40 (H) 01/16/2019   Lab Results  Component Value Date   FERRITIN 1,063 (H) 01/16/2019     STUDIES: No results found.  ASSESSMENT: MDS 5q-, CLL   PLAN:    1. MDS, 5q-: Bone marrow biopsy results from June 30, 2017 reviewed independently confirming progression of patient's MDS, 5q-. She has a hypercellular marrow for age as well as an increased blast count up to 6%.  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  Patient continues to decline treatment with Revlimid.  Previously, we also discussed the possibility of progressing to AML, but patient wishes no further interventions other than periodic blood transfusions.  Patient's hemoglobin is significantly reduced to 6.8, therefore will proceed with 1 unit of packed red blood cells today.  She also requires Desferal with every  infusion.  Return to clinic in 6 weeks for laboratory work on day 1 and then blood in Desferal on day 2. 2.  Elevated iron stores: Chronic and unchanged.  Desferal as above.   3.  CLL: Bone marrow biopsy did not mention any involvement of CLL.  White blood cell count is within normal limits today.  CT scan results from June 09, 2014 were independently reviewed and did not reveal any evidence of recurrent disease.  Previously, all of her other labwork was either negative or within normal limits. Patient last received single agent Rituxan in February 2015.  4.  Anxiety: Chronic and unchanged.  Continue Xanax as needed. 6.  Hypertension: Chronic and unchanged.  Unclear her compliance with her medications.  Continue follow-up and treatment per primary care. 5.  Back pain: Patient does not complain of this today. 6.  Abdominal bloating: Patient does not complain of this today.  MRI results from January 16, 2018 reviewed independently with no distinct etiology was noted, but incidentally a 1.7 cm cystic lesion suspicious for renal neoplasm was reported.  Continue follow-up with primary care.  Consider reimaging in approximately October 2020.  I spent a total of 25 minutes face-to-face with the patient and reviewing chart data of which greater than 50% of the visit was spent in counseling and coordination of care as detailed above.    Patient expressed understanding and was in agreement with this plan. She also understands that She can call clinic at any time with any questions, concerns, or complaints.   Lloyd Huger, MD   03/20/2019 11:30 AM

## 2019-03-18 ENCOUNTER — Other Ambulatory Visit: Payer: Self-pay

## 2019-03-18 ENCOUNTER — Other Ambulatory Visit: Payer: Self-pay | Admitting: Emergency Medicine

## 2019-03-18 DIAGNOSIS — D469 Myelodysplastic syndrome, unspecified: Secondary | ICD-10-CM

## 2019-03-19 ENCOUNTER — Inpatient Hospital Stay: Payer: Medicare Other | Attending: Oncology

## 2019-03-19 ENCOUNTER — Other Ambulatory Visit: Payer: Self-pay

## 2019-03-19 ENCOUNTER — Other Ambulatory Visit: Payer: Self-pay | Admitting: Oncology

## 2019-03-19 DIAGNOSIS — I10 Essential (primary) hypertension: Secondary | ICD-10-CM | POA: Diagnosis not present

## 2019-03-19 DIAGNOSIS — Z79899 Other long term (current) drug therapy: Secondary | ICD-10-CM | POA: Diagnosis not present

## 2019-03-19 DIAGNOSIS — Z90710 Acquired absence of both cervix and uterus: Secondary | ICD-10-CM | POA: Insufficient documentation

## 2019-03-19 DIAGNOSIS — Z7982 Long term (current) use of aspirin: Secondary | ICD-10-CM | POA: Diagnosis not present

## 2019-03-19 DIAGNOSIS — Z87891 Personal history of nicotine dependence: Secondary | ICD-10-CM | POA: Insufficient documentation

## 2019-03-19 DIAGNOSIS — D469 Myelodysplastic syndrome, unspecified: Secondary | ICD-10-CM

## 2019-03-19 DIAGNOSIS — M1990 Unspecified osteoarthritis, unspecified site: Secondary | ICD-10-CM | POA: Insufficient documentation

## 2019-03-19 DIAGNOSIS — C9110 Chronic lymphocytic leukemia of B-cell type not having achieved remission: Secondary | ICD-10-CM | POA: Diagnosis not present

## 2019-03-19 DIAGNOSIS — F419 Anxiety disorder, unspecified: Secondary | ICD-10-CM | POA: Diagnosis not present

## 2019-03-19 DIAGNOSIS — Z8543 Personal history of malignant neoplasm of ovary: Secondary | ICD-10-CM | POA: Insufficient documentation

## 2019-03-19 DIAGNOSIS — D46C Myelodysplastic syndrome with isolated del(5q) chromosomal abnormality: Secondary | ICD-10-CM

## 2019-03-19 LAB — CBC WITH DIFFERENTIAL/PLATELET
Abs Immature Granulocytes: 0.05 10*3/uL (ref 0.00–0.07)
Basophils Absolute: 0.1 10*3/uL (ref 0.0–0.1)
Basophils Relative: 1 %
Eosinophils Absolute: 0.1 10*3/uL (ref 0.0–0.5)
Eosinophils Relative: 2 %
HCT: 20.4 % — ABNORMAL LOW (ref 36.0–46.0)
Hemoglobin: 6.8 g/dL — ABNORMAL LOW (ref 12.0–15.0)
Immature Granulocytes: 1 %
Lymphocytes Relative: 11 %
Lymphs Abs: 0.7 10*3/uL (ref 0.7–4.0)
MCH: 38 pg — ABNORMAL HIGH (ref 26.0–34.0)
MCHC: 33.3 g/dL (ref 30.0–36.0)
MCV: 114 fL — ABNORMAL HIGH (ref 80.0–100.0)
Monocytes Absolute: 0.7 10*3/uL (ref 0.1–1.0)
Monocytes Relative: 12 %
Neutro Abs: 4.6 10*3/uL (ref 1.7–7.7)
Neutrophils Relative %: 73 %
Platelets: 274 10*3/uL (ref 150–400)
RBC: 1.79 MIL/uL — ABNORMAL LOW (ref 3.87–5.11)
RDW: 19.4 % — ABNORMAL HIGH (ref 11.5–15.5)
WBC: 6.3 10*3/uL (ref 4.0–10.5)
nRBC: 0 % (ref 0.0–0.2)

## 2019-03-19 LAB — PREPARE RBC (CROSSMATCH)

## 2019-03-19 LAB — SAMPLE TO BLOOD BANK

## 2019-03-19 NOTE — Progress Notes (Signed)
Patient pre screened for office appointment, no questions or concerns today. Patient reminded of upcoming appointment time and date. Patient has been experiencing a UTI and had a reaction to the ABT she has since been prescribed a different ABT but it is not in her record yet and she does not know the name. She fell last night and sustained bruising to her eye.

## 2019-03-20 ENCOUNTER — Inpatient Hospital Stay: Payer: Medicare Other

## 2019-03-20 ENCOUNTER — Other Ambulatory Visit: Payer: Self-pay

## 2019-03-20 ENCOUNTER — Inpatient Hospital Stay (HOSPITAL_BASED_OUTPATIENT_CLINIC_OR_DEPARTMENT_OTHER): Payer: Medicare Other | Admitting: Oncology

## 2019-03-20 DIAGNOSIS — C9110 Chronic lymphocytic leukemia of B-cell type not having achieved remission: Secondary | ICD-10-CM | POA: Diagnosis not present

## 2019-03-20 DIAGNOSIS — D46C Myelodysplastic syndrome with isolated del(5q) chromosomal abnormality: Secondary | ICD-10-CM

## 2019-03-20 DIAGNOSIS — I6523 Occlusion and stenosis of bilateral carotid arteries: Secondary | ICD-10-CM

## 2019-03-20 DIAGNOSIS — D469 Myelodysplastic syndrome, unspecified: Secondary | ICD-10-CM

## 2019-03-20 MED ORDER — SODIUM CHLORIDE 0.9 % IV SOLN
15.0000 mg/kg/h | INTRAVENOUS | Status: DC
  Filled 2019-03-20: qty 2

## 2019-03-20 MED ORDER — SODIUM CHLORIDE 0.9 % IV SOLN
Freq: Once | INTRAVENOUS | Status: AC
  Filled 2019-03-20: qty 250

## 2019-03-20 MED ORDER — SODIUM CHLORIDE 0.9% IV SOLUTION
250.0000 mL | Freq: Once | INTRAVENOUS | Status: AC
  Administered 2019-03-20: 13:00:00 250 mL via INTRAVENOUS
  Filled 2019-03-20: qty 250

## 2019-03-20 MED ORDER — HEPARIN SOD (PORK) LOCK FLUSH 100 UNIT/ML IV SOLN
500.0000 [IU] | Freq: Every day | INTRAVENOUS | Status: AC | PRN
  Administered 2019-03-20: 15:00:00 500 [IU]
  Filled 2019-03-20: qty 5

## 2019-03-20 MED ORDER — SODIUM CHLORIDE 0.9 % IV SOLN
15.0000 mg/kg/h | Freq: Once | INTRAVENOUS | Status: AC
  Administered 2019-03-20: 10:00:00 15 mg/kg/h via INTRAVENOUS
  Filled 2019-03-20: qty 2

## 2019-03-20 MED ORDER — DIAZEPAM 5 MG PO TABS
5.0000 mg | ORAL_TABLET | Freq: Once | ORAL | Status: AC
  Administered 2019-03-20: 13:00:00 5 mg via ORAL
  Filled 2019-03-20: qty 1

## 2019-03-20 MED ORDER — DIPHENHYDRAMINE HCL 50 MG/ML IJ SOLN
25.0000 mg | Freq: Once | INTRAMUSCULAR | Status: AC
  Administered 2019-03-20: 25 mg via INTRAVENOUS
  Filled 2019-03-20: qty 1

## 2019-03-20 MED ORDER — ACETAMINOPHEN 325 MG PO TABS
650.0000 mg | ORAL_TABLET | Freq: Once | ORAL | Status: AC
  Administered 2019-03-20: 13:00:00 650 mg via ORAL
  Filled 2019-03-20: qty 2

## 2019-03-20 MED ORDER — HEPARIN SOD (PORK) LOCK FLUSH 100 UNIT/ML IV SOLN
INTRAVENOUS | Status: AC
  Filled 2019-03-20: qty 5

## 2019-03-20 NOTE — Progress Notes (Signed)
0900: Pt reports to clinic for one unit of PRBCs and 2000 MG IV Desferal, orders clarified with Dr. Grayland Ormond.   1252: pt received 5mg  PO valium, per MD order per pt request. Pt states "I usually take a Valium halfway through treatment to help me from getting stressed and my blood pressure will go up over 200."  1310: B/P 193/67, pt states this is WNL and denies any s/s or concerns, no s/s of distress noted. Dr. Grayland Ormond aware and per Dr. Grayland Ormond okay to proceed with blood transfusion, NNO at this time.   1515: B/P 200/72, pt reports this is WNL, pt denies any s/s or complaints no s/s of distress noted. Dr. Grayland Ormond aware, and per Dr. Grayland Ormond okay to discharge pt home. NNO at this time.  Pt reports that she monitors b/p at home and and is aware of when to get help. Pt educated to call MD if symptoms arise or is B/P remains elevated or to report to ER/call 911 in the event of an emergency. Pt verbalizes understanding. Pt denies any concerns or complaints at this time. Pt stable at discharge.

## 2019-03-21 LAB — BPAM RBC
Blood Product Expiration Date: 202101022359
Blood Product Expiration Date: 202101132359
ISSUE DATE / TIME: 202012301317
Unit Type and Rh: 6200
Unit Type and Rh: 7300

## 2019-03-21 LAB — TYPE AND SCREEN
ABO/RH(D): AB POS
Antibody Screen: NEGATIVE
Unit division: 0
Unit division: 0

## 2019-03-25 ENCOUNTER — Other Ambulatory Visit: Payer: Self-pay | Admitting: *Deleted

## 2019-03-25 ENCOUNTER — Telehealth: Payer: Self-pay | Admitting: *Deleted

## 2019-03-25 MED ORDER — HYDROCODONE-ACETAMINOPHEN 5-325 MG PO TABS: 1.0000 | ORAL_TABLET | ORAL | 0 refills | Status: DC | PRN

## 2019-03-25 NOTE — Telephone Encounter (Signed)
Schedule message sent. 

## 2019-03-25 NOTE — Telephone Encounter (Signed)
Patient called requesting to change her appointment from 6 weeks to 4 weeks for possible blood transfusion as was suggested by doctor at her appointment Thursday and she wanted to wait 6 weeks, but she has changed her mind. Please reschedule it if in agreement and call patient

## 2019-03-25 NOTE — Telephone Encounter (Signed)
That's fine

## 2019-03-26 ENCOUNTER — Telehealth: Payer: Self-pay | Admitting: *Deleted

## 2019-03-26 NOTE — Telephone Encounter (Signed)
Called patient per her request to verify upcoming appts for lab on 1/26 at 1:30 and infusion on 1/27 at 9:00 AM, MD to follow transfusion. Patient verbalized understanding.

## 2019-04-05 ENCOUNTER — Other Ambulatory Visit: Payer: Self-pay | Admitting: *Deleted

## 2019-04-05 MED ORDER — ALPRAZOLAM 0.25 MG PO TABS: 0.2500 mg | ORAL_TABLET | Freq: Every day | ORAL | 0 refills | Status: DC | PRN

## 2019-04-05 NOTE — Telephone Encounter (Signed)
Patient called cancer center requesting refill of alprazolam 0.25 mg by mouth daily as needed for anxiety.  As mandated by the [place] STOP Act (Strengthen Opioid Misuse Prevention), the [place] Controlled Substance Reporting System ([place]) was reviewed for this patient. Below is the past 13-months of controlled substance prescriptions as displayed by the registry. I have personally consulted with my supervising physician, Dr. Grayland Ormond who agrees that continuation of opiate therapy is medically appropriate at this time and agrees to provide continual monitoring, including urine/blood drug screens, as indicated. Refill is appropriate on or after 04/10/2019.   [place] reviewed: Can be refilled on 04/10/19. Will post date.     Faythe Casa, NP 04/05/2019 11:01 AM [phone]

## 2019-04-05 NOTE — Telephone Encounter (Signed)
Patient picked up refill of xanax on 03/12/19. Medication was prescribed back in November 2020. Will re-fill from when she picked it up.

## 2019-04-13 NOTE — Progress Notes (Signed)
  [place]  Telephone:([phone]) [phone] Fax:(336) [phone]  ID: Chaney Born OB: 1930/01/31  MR#: [id number]  [id number]  Patient Care Team: [name], MD as PCP - General (Internal Medicine) Lloyd Huger, MD as Consulting Physician (Hematology and Oncology)   Lloyd Huger, MD   04/13/2019 7:59 AM      This encounter was created in error - please disregard.

## 2019-04-15 ENCOUNTER — Other Ambulatory Visit: Payer: Self-pay

## 2019-04-15 ENCOUNTER — Other Ambulatory Visit: Payer: Self-pay | Admitting: Emergency Medicine

## 2019-04-15 DIAGNOSIS — D469 Myelodysplastic syndrome, unspecified: Secondary | ICD-10-CM

## 2019-04-16 ENCOUNTER — Other Ambulatory Visit: Payer: Self-pay

## 2019-04-16 ENCOUNTER — Other Ambulatory Visit: Payer: Self-pay | Admitting: Oncology

## 2019-04-16 ENCOUNTER — Inpatient Hospital Stay: Payer: Medicare Other | Attending: Oncology

## 2019-04-16 DIAGNOSIS — D469 Myelodysplastic syndrome, unspecified: Secondary | ICD-10-CM | POA: Insufficient documentation

## 2019-04-16 DIAGNOSIS — D46C Myelodysplastic syndrome with isolated del(5q) chromosomal abnormality: Secondary | ICD-10-CM

## 2019-04-16 LAB — CBC WITH DIFFERENTIAL/PLATELET
Abs Immature Granulocytes: 0.18 10*3/uL — ABNORMAL HIGH (ref 0.00–0.07)
Basophils Absolute: 0.1 10*3/uL (ref 0.0–0.1)
Basophils Relative: 1 %
Eosinophils Absolute: 0.1 10*3/uL (ref 0.0–0.5)
Eosinophils Relative: 1 %
HCT: 24.7 % — ABNORMAL LOW (ref 36.0–46.0)
Hemoglobin: 8 g/dL — ABNORMAL LOW (ref 12.0–15.0)
Immature Granulocytes: 2 %
Lymphocytes Relative: 4 %
Lymphs Abs: 0.4 10*3/uL — ABNORMAL LOW (ref 0.7–4.0)
MCH: 36.4 pg — ABNORMAL HIGH (ref 26.0–34.0)
MCHC: 32.4 g/dL (ref 30.0–36.0)
MCV: 112.3 fL — ABNORMAL HIGH (ref 80.0–100.0)
Monocytes Absolute: 1.2 10*3/uL — ABNORMAL HIGH (ref 0.1–1.0)
Monocytes Relative: 11 %
Neutro Abs: 9.2 10*3/uL — ABNORMAL HIGH (ref 1.7–7.7)
Neutrophils Relative %: 81 %
Platelets: 330 10*3/uL (ref 150–400)
RBC: 2.2 MIL/uL — ABNORMAL LOW (ref 3.87–5.11)
RDW: 22.4 % — ABNORMAL HIGH (ref 11.5–15.5)
WBC: 11.2 10*3/uL — ABNORMAL HIGH (ref 4.0–10.5)
nRBC: 0 % (ref 0.0–0.2)

## 2019-04-16 LAB — SAMPLE TO BLOOD BANK

## 2019-04-16 MED ORDER — SODIUM CHLORIDE 0.9 % IV SOLN: 15.0000 mg/kg/h | Freq: Once | INTRAVENOUS | Status: DC

## 2019-04-16 NOTE — Progress Notes (Signed)
Patient pre screened for office appointment, no questions or concerns today. Patient reminded of upcoming appointment time and date. 

## 2019-04-17 ENCOUNTER — Inpatient Hospital Stay: Payer: Medicare Other

## 2019-04-17 ENCOUNTER — Inpatient Hospital Stay: Payer: Medicare Other | Admitting: Oncology

## 2019-04-18 LAB — TYPE AND SCREEN
ABO/RH(D): AB POS
Antibody Screen: NEGATIVE
Unit division: 0

## 2019-04-18 LAB — BPAM RBC
Blood Product Expiration Date: 202102102359
Unit Type and Rh: 6200

## 2019-04-18 LAB — PREPARE RBC (CROSSMATCH)

## 2019-04-30 ENCOUNTER — Other Ambulatory Visit: Payer: Medicare Other

## 2019-05-01 ENCOUNTER — Ambulatory Visit: Payer: Medicare Other

## 2019-05-01 ENCOUNTER — Ambulatory Visit: Payer: Medicare Other | Admitting: Oncology

## 2019-05-07 ENCOUNTER — Other Ambulatory Visit: Payer: Self-pay | Admitting: *Deleted

## 2019-05-07 MED ORDER — ALPRAZOLAM 0.25 MG PO TABS: 0.2500 mg | ORAL_TABLET | Freq: Every day | ORAL | 0 refills | Status: DC | PRN

## 2019-05-13 ENCOUNTER — Other Ambulatory Visit: Payer: Self-pay | Admitting: *Deleted

## 2019-05-13 MED ORDER — HYDROCODONE-ACETAMINOPHEN 5-325 MG PO TABS: 1.0000 | ORAL_TABLET | ORAL | 0 refills | Status: DC | PRN

## 2019-05-13 NOTE — Telephone Encounter (Signed)
Can you lease sign the 30 day supply plesae

## 2019-05-13 NOTE — Telephone Encounter (Signed)
Patient requesting a 3 month refill of her Norco. Please advise

## 2019-05-13 NOTE — Telephone Encounter (Signed)
Please advise patient that we wish that was an option for her but not allowed by law. We can do a month of medication at a time. If she has questions I'm happy to discuss further.

## 2019-05-16 ENCOUNTER — Telehealth: Payer: Self-pay | Admitting: *Deleted

## 2019-05-16 ENCOUNTER — Inpatient Hospital Stay: Payer: Medicare Other | Attending: Nurse Practitioner

## 2019-05-16 ENCOUNTER — Other Ambulatory Visit: Payer: Self-pay

## 2019-05-16 ENCOUNTER — Inpatient Hospital Stay (HOSPITAL_BASED_OUTPATIENT_CLINIC_OR_DEPARTMENT_OTHER): Payer: Medicare Other | Admitting: Nurse Practitioner

## 2019-05-16 VITALS — BP 195/65 | HR 98 | Temp 97.6°F | Resp 18

## 2019-05-16 DIAGNOSIS — Z5189 Encounter for other specified aftercare: Secondary | ICD-10-CM

## 2019-05-16 DIAGNOSIS — E876 Hypokalemia: Secondary | ICD-10-CM | POA: Diagnosis not present

## 2019-05-16 DIAGNOSIS — I10 Essential (primary) hypertension: Secondary | ICD-10-CM | POA: Insufficient documentation

## 2019-05-16 DIAGNOSIS — Z79899 Other long term (current) drug therapy: Secondary | ICD-10-CM | POA: Insufficient documentation

## 2019-05-16 DIAGNOSIS — Z8543 Personal history of malignant neoplasm of ovary: Secondary | ICD-10-CM | POA: Insufficient documentation

## 2019-05-16 DIAGNOSIS — Z87891 Personal history of nicotine dependence: Secondary | ICD-10-CM | POA: Diagnosis not present

## 2019-05-16 DIAGNOSIS — D46C Myelodysplastic syndrome with isolated del(5q) chromosomal abnormality: Secondary | ICD-10-CM | POA: Diagnosis not present

## 2019-05-16 DIAGNOSIS — R5383 Other fatigue: Secondary | ICD-10-CM | POA: Diagnosis not present

## 2019-05-16 DIAGNOSIS — R531 Weakness: Secondary | ICD-10-CM

## 2019-05-16 DIAGNOSIS — F419 Anxiety disorder, unspecified: Secondary | ICD-10-CM | POA: Diagnosis not present

## 2019-05-16 DIAGNOSIS — M1990 Unspecified osteoarthritis, unspecified site: Secondary | ICD-10-CM | POA: Diagnosis not present

## 2019-05-16 DIAGNOSIS — R5381 Other malaise: Secondary | ICD-10-CM | POA: Insufficient documentation

## 2019-05-16 DIAGNOSIS — Z7982 Long term (current) use of aspirin: Secondary | ICD-10-CM | POA: Diagnosis not present

## 2019-05-16 DIAGNOSIS — D649 Anemia, unspecified: Secondary | ICD-10-CM | POA: Diagnosis not present

## 2019-05-16 LAB — CBC WITH DIFFERENTIAL/PLATELET
Abs Immature Granulocytes: 0.04 10*3/uL (ref 0.00–0.07)
Basophils Absolute: 0.1 10*3/uL (ref 0.0–0.1)
Basophils Relative: 1 %
Eosinophils Absolute: 0.1 10*3/uL (ref 0.0–0.5)
Eosinophils Relative: 2 %
HCT: 20.2 % — ABNORMAL LOW (ref 36.0–46.0)
Hemoglobin: 6.4 g/dL — ABNORMAL LOW (ref 12.0–15.0)
Immature Granulocytes: 1 %
Lymphocytes Relative: 10 %
Lymphs Abs: 0.5 10*3/uL — ABNORMAL LOW (ref 0.7–4.0)
MCH: 36.4 pg — ABNORMAL HIGH (ref 26.0–34.0)
MCHC: 31.7 g/dL (ref 30.0–36.0)
MCV: 114.8 fL — ABNORMAL HIGH (ref 80.0–100.0)
Monocytes Absolute: 0.7 10*3/uL (ref 0.1–1.0)
Monocytes Relative: 14 %
Neutro Abs: 3.6 10*3/uL (ref 1.7–7.7)
Neutrophils Relative %: 72 %
Platelets: 367 10*3/uL (ref 150–400)
RBC: 1.76 MIL/uL — ABNORMAL LOW (ref 3.87–5.11)
RDW: 23.9 % — ABNORMAL HIGH (ref 11.5–15.5)
WBC: 5 10*3/uL (ref 4.0–10.5)
nRBC: 0 % (ref 0.0–0.2)

## 2019-05-16 LAB — COMPREHENSIVE METABOLIC PANEL
ALT: 26 U/L (ref 0–44)
AST: 20 U/L (ref 15–41)
Albumin: 4.2 g/dL (ref 3.5–5.0)
Alkaline Phosphatase: 78 U/L (ref 38–126)
Anion gap: 14 (ref 5–15)
BUN: 13 mg/dL (ref 8–23)
CO2: 21 mmol/L — ABNORMAL LOW (ref 22–32)
Calcium: 9.1 mg/dL (ref 8.9–10.3)
Chloride: 102 mmol/L (ref 98–111)
Creatinine, Ser: 0.65 mg/dL (ref 0.44–1.00)
GFR calc Af Amer: >60 mL/min (ref >60)
GFR calc non Af Amer: >60 mL/min (ref >60)
Glucose, Bld: 163 mg/dL — ABNORMAL HIGH (ref 70–99)
Potassium: 3.1 mmol/L — ABNORMAL LOW (ref 3.5–5.1)
Sodium: 137 mmol/L (ref 135–145)
Total Bilirubin: 0.7 mg/dL (ref 0.3–1.2)
Total Protein: 6.9 g/dL (ref 6.5–8.1)

## 2019-05-16 LAB — PREPARE RBC (CROSSMATCH)

## 2019-05-16 LAB — SAMPLE TO BLOOD BANK

## 2019-05-16 MED ORDER — POTASSIUM CHLORIDE CRYS ER 20 MEQ PO TBCR: 20.0000 meq | EXTENDED_RELEASE_TABLET | Freq: Every day | ORAL | 0 refills | Status: DC

## 2019-05-16 NOTE — Telephone Encounter (Signed)
Sounds appropriate. CBC, CMP, hold tube and SMC. Thanks!

## 2019-05-16 NOTE — Telephone Encounter (Signed)
Patient called reporting that she needs to be seen today, Since yesterday, she is very weak and her legs gave out on her. She thinks she may need a blood transfusion. Please advise

## 2019-05-16 NOTE — Progress Notes (Signed)
[facility] [place]  Telephone:([phone] [phone]  Patient Care Team: [name], MD as PCP - General (Internal Medicine) Lloyd Huger, MD as Consulting Physician (Hematology and Oncology)   Name of the patient: Jacqueline Russo  [id number]  [birth year]   Date of visit: 05/16/19  Diagnosis- MDS 5q-, CLL  Chief complaint/ Reason for visit- Weakness & Malaise  Heme/Onc history:  History of MDS, 5q deletion.  Patient refused treatment with Revlimid.  Underwent a bone marrow biopsy on 06/30/2017 which showed hypercellular marrow 50% with dyspoiesis and increased blasts ~6% consistent with myelodysplastic syndrome.  Given history of MDS with isolated deletion (5q), presence of increased blasts likely indicative of progressive disease.  Now transfusion dependent.  She continued to refuse Revlimid.  Previously had discussed possibility of progressing to AML but patient declined further interventions beyond periodic blood transfusions.  History of iron overload and she receives Desferal prior to transfusions.  Patient has history of CLL and received single agent Rituxan in February 2015.  Previous bone marrow biopsy did not mention any involvement of CLL. Oncology History   No history exists.    Interval history- Jacqueline Russo, 86 year old female, diagnosed with MDS with 5q deletion, declined Revlimid, currently transfusion dependent, who presents [facility], for weakness and malaise.  She says that when this is happened in the past she has needed a blood transfusion.  Says that her legs feel weak and shaky.  No dizziness or palpitations.  No fevers or chills.  Last transfusion was approximately 1 month ago.  No shortness of breath.  Has ongoing anxiety but is worse when she comes to doctor's appointments.  ECOG FS:2 - Symptomatic, <50% confined to bed  Review of systems- Review of Systems  Constitutional: Positive  for malaise/fatigue. Negative for chills, fever and weight loss.  HENT: Negative for hearing loss, nosebleeds, sore throat and tinnitus.   Eyes: Negative for blurred vision and double vision.  Respiratory: Negative for cough, hemoptysis, shortness of breath and wheezing.   Cardiovascular: Negative for chest pain, palpitations and leg swelling.  Gastrointestinal: Negative for abdominal pain, blood in stool, constipation, diarrhea, melena, nausea and vomiting.  Genitourinary: Negative for dysuria and urgency.  Musculoskeletal: Negative for back pain, falls, joint pain and myalgias.  Skin: Negative for itching and rash.  Neurological: Positive for weakness. Negative for dizziness, tingling, sensory change, loss of consciousness and headaches.  Endo/Heme/Allergies: Negative for environmental allergies. Does not bruise/bleed easily.  Psychiatric/Behavioral: Negative for depression. The patient is nervous/anxious. The patient does not have insomnia.      Current treatment- transfusion dependent  No Known Allergies  Past Medical History:  Diagnosis Date  . Anemia    Myelodysplastic syndrome  . Arthritis   . Hypertension   . Leukemia, chronic lymphoid (HCC)    CLL  . Ovarian cancer ([place]) 1963    Past Surgical History:  Procedure Laterality Date  . ABDOMINAL HYSTERECTOMY    . APPENDECTOMY    . BREAST SURGERY     cyst removed from right breast  . EYE SURGERY Bilateral    Cataract Extractions  . IR FLUORO GUIDE CV LINE RIGHT  06/30/2017  . REVERSE SHOULDER ARTHROPLASTY Right 10/28/2014   Procedure: REVERSE SHOULDER ARTHROPLASTY;  Surgeon: Corky Mull, MD;  Location: [facility] ORS;  Service: Orthopedics;  Laterality: Right;    Social History   Socioeconomic History  . Marital status: Widowed    Spouse name: Not  on file  . Number of children: Not on file  . Years of education: Not on file  . Highest education level: Not on file  Occupational History  . Not on file  Tobacco Use  .  Smoking status: Former Smoker    Packs/day: 2.00    Types: Cigarettes    Quit date: 1963    Years since quitting: 58.1  . Smokeless tobacco: Never Used  Substance and Sexual Activity  . Alcohol use: Yes    Alcohol/week: 1.0 standard drinks    Types: 1 Glasses of wine per week    Comment: 1 glass of wine daily  . Drug use: No  . Sexual activity: Not on file  Other Topics Concern  . Not on file  Social History Narrative  . Not on file   Social Determinants of Health   Financial Resource Strain:   . Difficulty of Paying Living Expenses: Not on file  Food Insecurity:   . Worried About Charity fundraiser in the Last Year: Not on file  . Ran Out of Food in the Last Year: Not on file  Transportation Needs:   . Lack of Transportation (Medical): Not on file  . Lack of Transportation (Non-Medical): Not on file  Physical Activity:   . Days of Exercise per Week: Not on file  . Minutes of Exercise per Session: Not on file  Stress:   . Feeling of Stress : Not on file  Social Connections:   . Frequency of Communication with Friends and Family: Not on file  . Frequency of Social Gatherings with Friends and Family: Not on file  . Attends Religious Services: Not on file  . Active Member of Clubs or Organizations: Not on file  . Attends Archivist Meetings: Not on file  . Marital Status: Not on file  Intimate Partner Violence:   . Fear of Current or Ex-Partner: Not on file  . Emotionally Abused: Not on file  . Physically Abused: Not on file  . Sexually Abused: Not on file    No family history on file.   Current Outpatient Medications:  .  ALPRAZolam (XANAX) 0.25 MG tablet, Take 1 tablet (0.25 mg total) by mouth daily as needed for anxiety., Disp: 90 tablet, Rfl: 0 .  aspirin 81 MG tablet, Take 81 mg by mouth daily., Disp: , Rfl:  .  cyanocobalamin (,VITAMIN B-12,) 1000 MCG/ML injection, Inject into the muscle., Disp: , Rfl:  .  diltiazem (CARDIZEM CD) 240 MG 24 hr  capsule, Take 1 capsule by mouth at bedtime., Disp: , Rfl:  .  diphenhydrAMINE (BENADRYL) 50 MG tablet, Take 50 mg by mouth at bedtime as needed for itching., Disp: , Rfl:  .  diphenhydrAMINE (SOMINEX) 25 MG tablet, Take 25 mg by mouth at bedtime., Disp: , Rfl:  .  HYDROcodone-acetaminophen (NORCO/VICODIN) 5-325 MG tablet, Take 1-2 tablets by mouth every 4 (four) hours as needed for moderate pain or severe pain., Disp: 90 tablet, Rfl: 0 .  lidocaine-prilocaine (EMLA) cream, Apply 1 application topically as needed. Apply to port 1-2 hours prior to appointment. Cover with plastic wrap., Disp: 30 g, Rfl: 0 .  Multiple Vitamins-Minerals (PRESERVISION AREDS 2+MULTI VIT PO), Take 1 tablet by mouth 1 day or 1 dose., Disp: , Rfl:  .  tiZANidine (ZANAFLEX) 2 MG tablet, Take 1 tablet (2 mg total) by mouth at bedtime as needed for muscle spasms., Disp: 30 tablet, Rfl: 0 No current facility-administered medications for this visit.  Facility-Administered Medications Ordered in Other Visits:  .  heparin lock flush 100 unit/mL, 500 Units, Intravenous, Once, Finnegan, Kathlene November, MD .  sodium chloride flush (NS) 0.9 % injection 10 mL, 10 mL, Intravenous, PRN, Lloyd Huger, MD, 10 mL at 09/05/18 5102  Physical exam:  Vitals:   05/16/19 1018  BP: (!) 195/65  Pulse: 98  Resp: 18  Temp: 97.6 F (36.4 C)  TempSrc: Tympanic  SpO2: 96%   Physical Exam Constitutional:      Appearance: She is well-developed.     Comments: Thin built.  Wearing mask.  Unaccompanied.  HENT:     Mouth/Throat:     Pharynx: No oropharyngeal exudate.  Eyes:     General: No scleral icterus.    Conjunctiva/sclera: Conjunctivae normal.  Cardiovascular:     Rate and Rhythm: Normal rate and regular rhythm.  Pulmonary:     Effort: Pulmonary effort is normal.     Breath sounds: Normal breath sounds.  Abdominal:     General: Bowel sounds are normal. There is no distension.     Palpations: Abdomen is soft.  Musculoskeletal:         General: Normal range of motion.     Cervical back: Normal range of motion and neck supple.     Comments: In wheelchair  Skin:    General: Skin is warm and dry.     Coloration: Skin is pale.  Neurological:     Mental Status: She is alert and oriented to person, place, and time.  Psychiatric:        Mood and Affect: Mood is anxious.     CMP Latest Ref Rng & Units 05/16/2019  Glucose 70 - 99 mg/dL 163(H)  BUN 8 - 23 mg/dL 13  Creatinine 0.44 - 1.00 mg/dL 0.65  Sodium 135 - 145 mmol/L 137  Potassium 3.5 - 5.1 mmol/L 3.1(L)  Chloride 98 - 111 mmol/L 102  CO2 22 - 32 mmol/L 21(L)  Calcium 8.9 - 10.3 mg/dL 9.1  Total Protein 6.5 - 8.1 g/dL 6.9  Total Bilirubin 0.3 - 1.2 mg/dL 0.7  Alkaline Phos 38 - 126 U/L 78  AST 15 - 41 U/L 20  ALT 0 - 44 U/L 26   CBC Latest Ref Rng & Units 05/16/2019  WBC 4.0 - 10.5 K/uL 5.0  Hemoglobin 12.0 - 15.0 g/dL 6.4(L)  Hematocrit 36.0 - 46.0 % 20.2(L)  Platelets 150 - 400 K/uL 367    No images are attached to the encounter.  No results found.  Assessment and plan- Patient is a 86 y.o. female diagnosed with MDS, refused Revlimid, currently transfusion dependent, who presents to symptom management clinic for symptomatic anemia.  1.  Symptomatic anemia-hemoglobin 6.4, hematocrit 20.2.  Symptomatic.  Plan to transfuse 1 unit irradiated PRBCs.  Plan to premedicate with Benadryl 25 mg and Tylenol 650 mg.  2.  Iron overload-plan for desferal prior to infusion of prbcs.   3. Anxiety-continued anxiety when at medical offices.  Blood pressure elevated as a result.  Patient says normal at home.  Will give Valium 5 mg p.o. for infusion tomorrow.  4.  Hypokalemia-potassium 3.1.  Likely secondary to inadequate intake.  Encourage potassium rich foods.  Start potassium 20 mEq once daily for 10 days.  Disposition: Follow-up with Dr. Grayland Ormond & Josh Borders/palliative care as scheduled with labs.  Return to clinic if symptoms do not improve after infusions  or worsen in the interim.   Visit Diagnosis 1. MDS (myelodysplastic syndrome)  with 5q deletion ([place])   2. Symptomatic anemia   3. Iron overload   4. Hypokalemia due to inadequate potassium intake   5. Anxiety     Patient expressed understanding and was in agreement with this plan. She also understands that She can call clinic at any time with any questions, concerns, or complaints.   Thank you for allowing me to participate in the care of this very pleasant patient.   Beckey Rutter, DNP, AGNP-C [place] [facility] [place]

## 2019-05-16 NOTE — Telephone Encounter (Signed)
Patient coming as soon as she can get a ride (approximately 10- 1015) Labs entered appointment entered. Per Infusion charge nurse, Blood cannot be added today, she would have to come tomorrow for that due to fact that it takes a while for her crossmatch and time of day as well as staffing.

## 2019-05-17 ENCOUNTER — Inpatient Hospital Stay: Payer: Medicare Other

## 2019-05-17 ENCOUNTER — Encounter: Payer: Self-pay | Admitting: Nurse Practitioner

## 2019-05-17 ENCOUNTER — Other Ambulatory Visit: Payer: Self-pay

## 2019-05-17 DIAGNOSIS — D46C Myelodysplastic syndrome with isolated del(5q) chromosomal abnormality: Secondary | ICD-10-CM

## 2019-05-17 DIAGNOSIS — F419 Anxiety disorder, unspecified: Secondary | ICD-10-CM | POA: Insufficient documentation

## 2019-05-17 DIAGNOSIS — E876 Hypokalemia: Secondary | ICD-10-CM | POA: Insufficient documentation

## 2019-05-17 DIAGNOSIS — D649 Anemia, unspecified: Secondary | ICD-10-CM | POA: Insufficient documentation

## 2019-05-17 MED ORDER — DIAZEPAM 5 MG PO TABS
5.0000 mg | ORAL_TABLET | Freq: Once | ORAL | Status: AC
  Administered 2019-05-17: 5 mg via ORAL
  Filled 2019-05-17: qty 1

## 2019-05-17 MED ORDER — SODIUM CHLORIDE 0.9% FLUSH
10.0000 mL | INTRAVENOUS | Status: AC | PRN
  Administered 2019-05-17: 10 mL
  Filled 2019-05-17: qty 10

## 2019-05-17 MED ORDER — SODIUM CHLORIDE 0.9 % IV SOLN
15.0000 mg/kg/h | Freq: Once | INTRAVENOUS | Status: AC
  Administered 2019-05-17: 15 mg/kg/h via INTRAVENOUS
  Filled 2019-05-17: qty 2

## 2019-05-17 MED ORDER — DIPHENHYDRAMINE HCL 25 MG PO CAPS
25.0000 mg | ORAL_CAPSULE | Freq: Once | ORAL | Status: AC
  Administered 2019-05-17: 25 mg via ORAL
  Filled 2019-05-17: qty 1

## 2019-05-17 MED ORDER — SODIUM CHLORIDE 0.9% IV SOLUTION
250.0000 mL | Freq: Once | INTRAVENOUS | Status: AC
  Administered 2019-05-17: 250 mL via INTRAVENOUS
  Filled 2019-05-17: qty 250

## 2019-05-17 MED ORDER — HEPARIN SOD (PORK) LOCK FLUSH 100 UNIT/ML IV SOLN
500.0000 [IU] | Freq: Every day | INTRAVENOUS | Status: AC | PRN
  Administered 2019-05-17: 500 [IU]
  Filled 2019-05-17: qty 5

## 2019-05-17 MED ORDER — ACETAMINOPHEN 325 MG PO TABS
650.0000 mg | ORAL_TABLET | Freq: Once | ORAL | Status: AC
  Administered 2019-05-17: 650 mg via ORAL
  Filled 2019-05-17: qty 2

## 2019-05-17 MED ORDER — HEPARIN SOD (PORK) LOCK FLUSH 100 UNIT/ML IV SOLN
INTRAVENOUS | Status: AC
  Filled 2019-05-17: qty 5

## 2019-05-17 NOTE — Progress Notes (Signed)
Patient reports her blood pressure normally runs very high and states, "the top number is usually over 200 and that's normal for me. The doctor knows my blood pressure runs high." Blood Pressure: 179/60 today. Patient states, "that's really good for me." MD, Dr. Grayland Ormond, and NP, Beckey Rutter, notified. Per NP order: proceed with scheduled Desferal infusion and pRBC's transfusion today.  Per NP, Beckey Rutter, order: Tylenol 650 mg PO once to be given prior to pRBCs transfusion order entered at this time, see MAR.  Patient states, "I get very anxious while I'm here and this causes my blood pressure to rise more. Dr. Grayland Ormond usually orders me a Valium to take while I'm here. Did he order that today? If not, I will need one." MD, Dr. Grayland Ormond, and NP, Beckey Rutter, notified. Per NP order: Valium 5 mg PO once order entered, see MAR.

## 2019-05-19 IMAGING — US IR FLUORO GUIDE CV LINE*R*
1 series · 1 of 1 positions shown · non-contrast
Comparison: None.

INDICATION: History of CLL and poor venous access. In need of durable
intravenous access for chemotherapy administration.

EXAM:
IMPLANTED PORT A CATH PLACEMENT WITH ULTRASOUND AND FLUOROSCOPIC
GUIDANCE

[Series 1: ir fluoro guide cv line*right* · 1 of 1 slices shown]
[im 1/1]
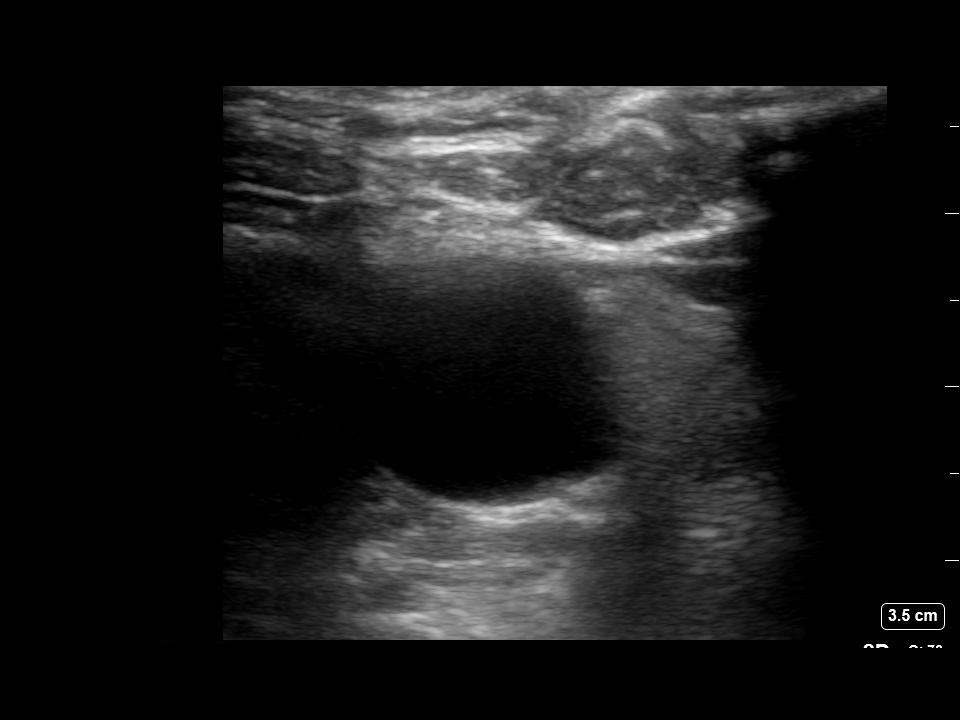

[1 of 1 positions shown; findings below may reference images not displayed]

MEDICATIONS:
Ancef 2 gm IV; The antibiotic was administered within an appropriate
time interval prior to skin puncture.

ANESTHESIA/SEDATION:
Moderate (conscious) sedation was employed during this procedure. A
total of Versed 1 mg and Fentanyl 50 mcg was administered
intravenously.

Moderate Sedation Time: 30 minutes. The patient's level of
consciousness and vital signs were monitored continuously by
radiology nursing throughout the procedure under my direct
supervision.

CONTRAST:  None

FLUOROSCOPY TIME:  54 seconds (6 mGy)

COMPLICATIONS:
None immediate.

PROCEDURE:
The procedure, risks, benefits, and alternatives were explained to
the patient. Questions regarding the procedure were encouraged and
answered. The patient understands and consents to the procedure.

The right neck and chest were prepped with chlorhexidine in a
sterile fashion, and a sterile drape was applied covering the
operative field. Maximum barrier sterile technique with sterile
gowns and gloves were used for the procedure. A timeout was
performed prior to the initiation of the procedure. Local anesthesia
was provided with 1% lidocaine with epinephrine.

After creating a small venotomy incision, a micropuncture kit was
utilized to access the internal jugular vein. Real-time ultrasound
guidance was utilized for vascular access including the acquisition
of a permanent ultrasound image documenting patency of the accessed
vessel. The microwire was utilized to measure appropriate catheter
length.

A subcutaneous port pocket was then created along the upper chest
wall utilizing a combination of sharp and blunt dissection. The
pocket was irrigated with sterile saline. A single lumen power
injectable port was chosen for placement. The 8 Fr catheter was
tunneled from the port pocket site to the venotomy incision. The
port was placed in the pocket. The external catheter was trimmed to
appropriate length. At the venotomy, an 8 Fr peel-away sheath was
placed over a guidewire under fluoroscopic guidance. The catheter
was then placed through the sheath and the sheath was removed. Final
catheter positioning was confirmed and documented with a
fluoroscopic spot radiograph. The port was accessed with Jaylon Aujla
needle, aspirated and flushed with heparinized saline.

The venotomy site was closed with an interrupted 4-0 Vicryl suture.
The port pocket incision was closed with interrupted 2-0 Vicryl
suture and the skin was opposed with a running subcuticular 4-0
Vicryl suture. Dermabond and Benrabah were applied to both
incisions. Dressings were placed. The patient tolerated the
procedure well without immediate post procedural complication.
FINDINGS: After catheter placement, the tip lies within the superior
cavoatrial junction. The catheter aspirates and flushes normally and
is ready for immediate use.
IMPRESSION: Successful placement of a right internal jugular approach power
injectable Port-A-Cath. The catheter is ready for immediate use.

## 2019-05-20 LAB — TYPE AND SCREEN
ABO/RH(D): AB POS
Antibody Screen: NEGATIVE
Unit division: 0

## 2019-05-20 LAB — BPAM RBC
Blood Product Expiration Date: 202103132359
ISSUE DATE / TIME: 202102261227
Unit Type and Rh: 6200

## 2019-05-23 ENCOUNTER — Inpatient Hospital Stay: Payer: Medicare Other | Attending: Oncology

## 2019-05-23 ENCOUNTER — Other Ambulatory Visit: Payer: Self-pay | Admitting: Emergency Medicine

## 2019-05-23 ENCOUNTER — Other Ambulatory Visit: Payer: Self-pay

## 2019-05-23 ENCOUNTER — Other Ambulatory Visit (HOSPITAL_COMMUNITY): Payer: Self-pay | Admitting: Nurse Practitioner

## 2019-05-23 ENCOUNTER — Inpatient Hospital Stay (HOSPITAL_BASED_OUTPATIENT_CLINIC_OR_DEPARTMENT_OTHER): Payer: Medicare Other | Admitting: Hospice and Palliative Medicine

## 2019-05-23 VITALS — BP 223/89 | HR 87 | Temp 97.8°F | Resp 16 | Wt 104.0 lb

## 2019-05-23 DIAGNOSIS — D46C Myelodysplastic syndrome with isolated del(5q) chromosomal abnormality: Secondary | ICD-10-CM

## 2019-05-23 DIAGNOSIS — E875 Hyperkalemia: Secondary | ICD-10-CM | POA: Diagnosis not present

## 2019-05-23 DIAGNOSIS — Z8543 Personal history of malignant neoplasm of ovary: Secondary | ICD-10-CM | POA: Insufficient documentation

## 2019-05-23 DIAGNOSIS — Z9225 Personal history of immunosupression therapy: Secondary | ICD-10-CM | POA: Insufficient documentation

## 2019-05-23 DIAGNOSIS — C9110 Chronic lymphocytic leukemia of B-cell type not having achieved remission: Secondary | ICD-10-CM | POA: Insufficient documentation

## 2019-05-23 DIAGNOSIS — R5381 Other malaise: Secondary | ICD-10-CM | POA: Diagnosis not present

## 2019-05-23 DIAGNOSIS — E876 Hypokalemia: Secondary | ICD-10-CM | POA: Insufficient documentation

## 2019-05-23 DIAGNOSIS — I10 Essential (primary) hypertension: Secondary | ICD-10-CM | POA: Diagnosis not present

## 2019-05-23 DIAGNOSIS — M1990 Unspecified osteoarthritis, unspecified site: Secondary | ICD-10-CM | POA: Diagnosis not present

## 2019-05-23 DIAGNOSIS — Z515 Encounter for palliative care: Secondary | ICD-10-CM

## 2019-05-23 DIAGNOSIS — D469 Myelodysplastic syndrome, unspecified: Secondary | ICD-10-CM

## 2019-05-23 DIAGNOSIS — Z79899 Other long term (current) drug therapy: Secondary | ICD-10-CM | POA: Diagnosis not present

## 2019-05-23 DIAGNOSIS — Z7982 Long term (current) use of aspirin: Secondary | ICD-10-CM | POA: Insufficient documentation

## 2019-05-23 DIAGNOSIS — R5383 Other fatigue: Secondary | ICD-10-CM | POA: Diagnosis not present

## 2019-05-23 LAB — BASIC METABOLIC PANEL
Anion gap: 12 (ref 5–15)
BUN: 17 mg/dL (ref 8–23)
CO2: 21 mmol/L — ABNORMAL LOW (ref 22–32)
Calcium: 8.7 mg/dL — ABNORMAL LOW (ref 8.9–10.3)
Chloride: 104 mmol/L (ref 98–111)
Creatinine, Ser: 0.62 mg/dL (ref 0.44–1.00)
GFR calc Af Amer: >60 mL/min (ref >60)
GFR calc non Af Amer: >60 mL/min (ref >60)
Glucose, Bld: 125 mg/dL — ABNORMAL HIGH (ref 70–99)
Potassium: 2.9 mmol/L — ABNORMAL LOW (ref 3.5–5.1)
Sodium: 137 mmol/L (ref 135–145)

## 2019-05-23 LAB — CBC WITH DIFFERENTIAL/PLATELET
Abs Immature Granulocytes: 0.05 10*3/uL (ref 0.00–0.07)
Basophils Absolute: 0.1 10*3/uL (ref 0.0–0.1)
Basophils Relative: 2 %
Eosinophils Absolute: 0.2 10*3/uL (ref 0.0–0.5)
Eosinophils Relative: 4 %
HCT: 23.6 % — ABNORMAL LOW (ref 36.0–46.0)
Hemoglobin: 7.4 g/dL — ABNORMAL LOW (ref 12.0–15.0)
Immature Granulocytes: 1 %
Lymphocytes Relative: 17 %
Lymphs Abs: 0.8 10*3/uL (ref 0.7–4.0)
MCH: 33.3 pg (ref 26.0–34.0)
MCHC: 31.4 g/dL (ref 30.0–36.0)
MCV: 106.3 fL — ABNORMAL HIGH (ref 80.0–100.0)
Monocytes Absolute: 0.8 10*3/uL (ref 0.1–1.0)
Monocytes Relative: 17 %
Neutro Abs: 2.9 10*3/uL (ref 1.7–7.7)
Neutrophils Relative %: 59 %
Platelets: 268 10*3/uL (ref 150–400)
RBC: 2.22 MIL/uL — ABNORMAL LOW (ref 3.87–5.11)
RDW: 25.4 % — ABNORMAL HIGH (ref 11.5–15.5)
WBC: 4.9 10*3/uL (ref 4.0–10.5)
nRBC: 0 % (ref 0.0–0.2)

## 2019-05-23 LAB — SAMPLE TO BLOOD BANK

## 2019-05-23 LAB — MAGNESIUM: Magnesium: 1.5 mg/dL — ABNORMAL LOW (ref 1.7–2.4)

## 2019-05-23 MED ORDER — ONDANSETRON HCL 8 MG PO TABS: 8.0000 mg | ORAL_TABLET | Freq: Three times a day (TID) | ORAL | 0 refills | Status: DC | PRN

## 2019-05-23 MED ORDER — MIRTAZAPINE 7.5 MG PO TABS: 7.5000 mg | ORAL_TABLET | Freq: Every day | ORAL | 2 refills | Status: DC

## 2019-05-23 MED ORDER — POTASSIUM CHLORIDE CRYS ER 20 MEQ PO TBCR: 20.0000 meq | EXTENDED_RELEASE_TABLET | Freq: Every day | ORAL | 0 refills | Status: DC

## 2019-05-23 MED ORDER — MAGNESIUM OXIDE 400 (241.3 MG) MG PO TABS: 400.0000 mg | ORAL_TABLET | Freq: Every day | ORAL | 0 refills | Status: DC

## 2019-05-23 NOTE — Progress Notes (Signed)
[facility]  [phone] [phone]   Name: Jacqueline Russo Date: 05/23/2019 MRN: [id number]  DOB: March 16, 1930  Patient Care Team: [name], MD as PCP - General (Internal Medicine) Lloyd Huger, MD as Consulting Physician (Hematology and Oncology)    REASON FOR CONSULTATION: Jacqueline Russo is a 86 y.o. female with multiple medical problems including history of MDS transfusion dependent.  There has been concern for the possibility of progression to AML but patient has declined Revlimid or other interventions beyond periodic blood transfusions.  Patient has history of CLL and is status post single agent Rituxan in February 2015.  She was referred to palliative care to help address goals and manage any symptomatic complaints.  SOCIAL HISTORY:     reports that she quit smoking about 58 years ago. Her smoking use included cigarettes. She smoked 2.00 packs per day. She has never used smokeless tobacco. She reports current alcohol use of about 1.0 standard drinks of alcohol per week. She reports that she does not use drugs.   Patient is widowed.  She lives at the [facility].  Patient has a son in [place] and another son in [place].  She has a sister who lives nearby.  Patient is originally from [place] and then lived in [place] prior to immigrating to the [place].  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  ADVANCE DIRECTIVES:  On file  CODE STATUS: DNR/DNI (MOST form completed on 05/23/19)  PAST MEDICAL HISTORY: Past Medical History:  Diagnosis Date  . Anemia    Myelodysplastic syndrome  . Arthritis   . Hypertension   . Leukemia, chronic lymphoid (HCC)    CLL  . Ovarian cancer ([facility]) 1963    PAST SURGICAL HISTORY:  Past Surgical History:  Procedure Laterality Date  . ABDOMINAL HYSTERECTOMY    . APPENDECTOMY    . BREAST SURGERY     cyst removed from right breast  . EYE  SURGERY Bilateral    Cataract Extractions  . IR FLUORO GUIDE CV LINE RIGHT  06/30/2017  . REVERSE SHOULDER ARTHROPLASTY Right 10/28/2014   Procedure: REVERSE SHOULDER ARTHROPLASTY;  Surgeon: Corky Mull, MD;  Location: [facility] ORS;  Service: Orthopedics;  Laterality: Right;    HEMATOLOGY/ONCOLOGY HISTORY:  Oncology History   No history exists.    ALLERGIES:  has No Known Allergies.  MEDICATIONS:  Current Outpatient Medications  Medication Sig Dispense Refill  . ALPRAZolam (XANAX) 0.25 MG tablet Take 1 tablet (0.25 mg total) by mouth daily as needed for anxiety. 90 tablet 0  . aspirin 81 MG tablet Take 81 mg by mouth daily.    . cyanocobalamin (,VITAMIN B-12,) 1000 MCG/ML injection Inject into the muscle.    . diltiazem (CARDIZEM CD) 240 MG 24 hr capsule Take 1 capsule by mouth at bedtime.    . diphenhydrAMINE (BENADRYL) 50 MG tablet Take 50 mg by mouth at bedtime as needed for itching.    . diphenhydrAMINE (SOMINEX) 25 MG tablet Take 25 mg by mouth at bedtime.    Marland Kitchen HYDROcodone-acetaminophen (NORCO/VICODIN) 5-325 MG tablet Take 1-2 tablets by mouth every 4 (four) hours as needed for moderate pain or severe pain. 90 tablet 0  . lidocaine-prilocaine (EMLA) cream Apply 1 application topically as needed. Apply to port 1-2 hours prior to appointment. Cover with plastic wrap. 30 g 0  . Multiple Vitamins-Minerals (PRESERVISION AREDS 2+MULTI VIT PO) Take 1 tablet by  mouth 1 day or 1 dose.    . potassium chloride SA (KLOR-CON) 20 MEQ tablet Take 1 tablet (20 mEq total) by mouth daily. 10 tablet 0  . tiZANidine (ZANAFLEX) 2 MG tablet Take 1 tablet (2 mg total) by mouth at bedtime as needed for muscle spasms. 30 tablet 0   No current facility-administered medications for this visit.   Facility-Administered Medications Ordered in Other Visits  Medication Dose Route Frequency Provider Last Rate Last Admin  . heparin lock flush 100 unit/mL  500 Units Intravenous Once Lloyd Huger, MD      .  sodium chloride flush (NS) 0.9 % injection 10 mL  10 mL Intravenous PRN Lloyd Huger, MD   10 mL at 09/05/18 5366    VITAL SIGNS: There were no vitals taken for this visit. There were no vitals filed for this visit.  Estimated body mass index is 19.88 kg/m as calculated from the following:   Height as of 01/03/19: 5' (1.524 m).   Weight as of 05/17/19: 101 lb 12.8 oz (46.2 kg).  LABS: CBC:    Component Value Date/Time   WBC 5.0 05/16/2019 0954   HGB 6.4 (L) 05/16/2019 0954   HGB 11.6 (L) 01/06/2014 0828   HCT 20.2 (L) 05/16/2019 0954   HCT 32.7 (L) 01/06/2014 0828   PLT 367 05/16/2019 0954   PLT 176 01/06/2014 0828   MCV 114.8 (H) 05/16/2019 0954   MCV 103 (H) 01/06/2014 0828   NEUTROABS 3.6 05/16/2019 0954   NEUTROABS 1.6 01/06/2014 0828   LYMPHSABS 0.5 (L) 05/16/2019 0954   LYMPHSABS 1.4 01/06/2014 0828   MONOABS 0.7 05/16/2019 0954   MONOABS 0.3 01/06/2014 0828   EOSABS 0.1 05/16/2019 0954   EOSABS 0.1 01/06/2014 0828   BASOSABS 0.1 05/16/2019 0954   BASOSABS 0.1 01/06/2014 0828   Comprehensive Metabolic Panel:    Component Value Date/Time   NA 137 05/16/2019 0954   NA 140 06/26/2013 1044   K 3.1 (L) 05/16/2019 0954   K 3.7 06/26/2013 1044   CL 102 05/16/2019 0954   CL 102 06/26/2013 1044   CO2 21 (L) 05/16/2019 0954   CO2 32 06/26/2013 1044   BUN 13 05/16/2019 0954   BUN 16 06/26/2013 1044   CREATININE 0.65 05/16/2019 0954   CREATININE 1.01 01/06/2014 0828   GLUCOSE 163 (H) 05/16/2019 0954   GLUCOSE 121 (H) 06/26/2013 1044   CALCIUM 9.1 05/16/2019 0954   CALCIUM 9.0 06/26/2013 1044   AST 20 05/16/2019 0954   AST 12 (L) 03/27/2013 0955   ALT 26 05/16/2019 0954   ALT 20 03/27/2013 0955   ALKPHOS 78 05/16/2019 0954   ALKPHOS 87 03/27/2013 0955   BILITOT 0.7 05/16/2019 0954   BILITOT 0.5 03/27/2013 0955   PROT 6.9 05/16/2019 0954   PROT 7.3 03/27/2013 0955   ALBUMIN 4.2 05/16/2019 0954   ALBUMIN 4.1 03/27/2013 0955    RADIOGRAPHIC STUDIES: No  results found.  PERFORMANCE STATUS (ECOG) : 1 - Symptomatic but completely ambulatory  Review of Systems Unless otherwise noted, a complete review of systems is negative.  Physical Exam General: NAD Pulmonary: Unlabored Abdomen: soft, nontender, + bowel sounds GU: no suprapubic tenderness Extremities: Trace bilateral lower edema, no joint deformities Skin: no rashes Neurological: Weakness but otherwise nonfocal  IMPRESSION: Patient was seen in the clinic today at the request of Beckey Rutter, NP.  Lauren saw patient on 05/16/2019 at which time patient received 1 unit irradiated PRBCs due to symptomatic anemia  with hemoglobin of 6.4 and hematocrit of 20.2.  Patient was also hypokalemic and started on potassium 20 mEq daily x10 days.  Labs show persistent hypokalemia.  The lab added on a magnesium level, which was also low.  Patient reports that she did not take the potassium that was prescribed to her.  We had a lengthy discussion regarding the risks of hyperkalemia and patient says that she is now willing to take supplementation.  Patient reports minimal oral intake.  Suspect that her hypokalemia is secondary to inadequate p.o. intake.  Patient also reports chronic history of intermittent nausea/constipation/diarrhea.  She could have component of GI loss.  Discussed GI work-up or referral to gastroenterology but patient declined.  Patient is interested in speaking with our dietitian.  She has tried oral supplements but cannot tolerate.  She asks for something to help improve her appetite.  Given report of chronic insomnia, will trial mirtazapine.  Patient denies depression or anxiety.  Note the patient was also significantly hypertensive today in the clinic.  Patient says that she is chronically hypertensive.  She is only taking Cardizem prescribed by Dr. Sabra Heck.  Patient says that she has tried multiple antihypertensives previously, which have been ineffective.  I suggested referral to the ER  urgent care but patient declined.  Patient clearly verbalized an understanding of the risks of hypertension including stroke or death and patient says that that is okay with her if it happens.  Patient says that she is 86 years old and that she has lived a good life.  She verbalized that she is not interested in aggressive work-up and only wants to focus on quality of life. She wants to minimize oral medications. She says that she is not interested in future hospitalization.  She only wants to stay home and be comfortable even if she had a condition that could be treated. She says "I am okay with whatever happens" and clarifies that could include death.   I completed a MOST form today. The patient and family outlined their wishes for the following treatment decisions:  Cardiopulmonary Resuscitation: Do Not Attempt Resuscitation (DNR/No CPR)  Medical Interventions: Comfort Measures: Keep clean, warm, and dry. Use medication by any route, positioning, wound care, and other measures to relieve pain and suffering. Use oxygen, suction and manual treatment of airway obstruction as needed for comfort. Do not transfer to the hospital unless comfort needs cannot be met in current location.  Antibiotics: Antibiotics if indicated  IV Fluids: IV fluids if indicated  Feeding Tube: No feeding tube    PLAN: -Best supportive care -CBC, basic metabolic panel, mag checked today -Start K-Dur 20 mEq daily with Mag-Ox 400 mg daily -Ondansetron 8 mg every 8 hours as needed for nausea -Mirtazapine 7.5 mg nightly -Referral to dietitian -DNR/DNI -MOST form completed -comfort measures only -RTC in 2 weeks to see [facility] (recheck CBC, CMET, Mg)  Case and plan discussed in detail with Dr. Grayland Ormond   Patient expressed understanding and was in agreement with this plan. She also understands that She can call the clinic at any time with any questions, concerns, or complaints.     Time Total: 45 minutes  Visit consisted  of counseling and education dealing with the complex and emotionally intense issues of symptom management and palliative care in the setting of serious and potentially life-threatening illness.Greater than 50%  of this time was spent counseling and coordinating care related to the above assessment and plan.  Signed by: Altha Harm, PhD, NP-C

## 2019-05-27 ENCOUNTER — Telehealth: Payer: Self-pay

## 2019-05-27 NOTE — Telephone Encounter (Signed)
Nutrition Assessment   Reason for Assessment:   Referral from [place], NP   ASSESSMENT:  87 year old female with MDS transfusion dependent, CLL, HTN, arthritis, ovarian cancer.    Spoke with patient via phone for nutrition assessment.  Patient reports that she has no appetite.  Just started taking remeron last night.  Reports appetite has been poor for the last several months.  Reports issues with nausea, took zofran this am.  Reports that yesterday she ate some soup but nothing else.  Reports that typically she would have fruit sitting out on table and nibble on that or eat cashews but has not been eating these items.  Reports issues with dry mouth, drinks water and tea, milk and kefir with medications.  Does have issues with constipation.  Has started taking stool softner.  Reports that she wants to eat more because what she is currently doing is not working   Medications: reviewed   Labs: K 2.9, Mag 1.5   Anthropometrics:   Height: 60 inches Weight: 104 lb Noted 111 lb 01/03/2019 BMI: 20 6% weight loss in the last 5 months, concerning   Estimated Energy Needs  Kcals: 1400-1645 Protein: 70-82 g Fluid: > 1.4L   NUTRITION DIAGNOSIS: Inadequate oral intake related to poor appetite as evidenced by weight loss and reduced intake   INTERVENTION:  Encouraged setting schedule to have min snack/meal.  Encouraged patient to take nausea medications 33minutes prior to eating and can take q 8 hours if needed Discussed strategies to increase calories and protein in small amount that is able to eat.  Will mail handout and high calorie recipes Patient has contact information and will contact RD with questions   Next Visit: no follow-up, patient will call RD with questions  Jovoni Borkenhagen B. Zenia Resides, [place], [place] Registered Dietitian [phone] (pager)

## 2019-05-28 ENCOUNTER — Other Ambulatory Visit (INDEPENDENT_AMBULATORY_CARE_PROVIDER_SITE_OTHER): Payer: Self-pay | Admitting: Nurse Practitioner

## 2019-05-28 DIAGNOSIS — I6529 Occlusion and stenosis of unspecified carotid artery: Secondary | ICD-10-CM

## 2019-05-30 ENCOUNTER — Other Ambulatory Visit: Payer: Self-pay

## 2019-05-30 ENCOUNTER — Encounter (INDEPENDENT_AMBULATORY_CARE_PROVIDER_SITE_OTHER): Payer: Self-pay | Admitting: Vascular Surgery

## 2019-05-30 ENCOUNTER — Ambulatory Visit (INDEPENDENT_AMBULATORY_CARE_PROVIDER_SITE_OTHER): Payer: Medicare Other | Admitting: Vascular Surgery

## 2019-05-30 ENCOUNTER — Ambulatory Visit (INDEPENDENT_AMBULATORY_CARE_PROVIDER_SITE_OTHER): Payer: Medicare Other

## 2019-05-30 VITALS — BP 245/45 | HR 101 | Resp 16 | Ht 60.0 in | Wt 104.0 lb

## 2019-05-30 DIAGNOSIS — E785 Hyperlipidemia, unspecified: Secondary | ICD-10-CM | POA: Diagnosis not present

## 2019-05-30 DIAGNOSIS — E1169 Type 2 diabetes mellitus with other specified complication: Secondary | ICD-10-CM

## 2019-05-30 DIAGNOSIS — I6523 Occlusion and stenosis of bilateral carotid arteries: Secondary | ICD-10-CM

## 2019-05-30 DIAGNOSIS — I6529 Occlusion and stenosis of unspecified carotid artery: Secondary | ICD-10-CM | POA: Diagnosis not present

## 2019-05-30 DIAGNOSIS — I10 Essential (primary) hypertension: Secondary | ICD-10-CM | POA: Diagnosis not present

## 2019-05-30 DIAGNOSIS — E782 Mixed hyperlipidemia: Secondary | ICD-10-CM

## 2019-05-30 NOTE — Progress Notes (Signed)
MRN : [id number]  Jacqueline Russo is a 86 y.o. (1929-12-11) female who presents with chief complaint of No chief complaint on file. Marland Kitchen  History of Present Illness:   The patient is seen for follow up evaluation of carotid stenosis. The carotid stenosis followed by ultrasound.   The patient denies amaurosis fugax. There is no recent history of TIA symptoms or focal motor deficits. There is no prior documented CVA.  The patient is taking enteric-coated aspirin 81 mg daily.  There is no history of migraine headaches. There is no history of seizures.  The patient has a history of coronary artery disease, no recent episodes of angina or shortness of breath. The patient denies PAD or claudication symptoms. There is a history of hyperlipidemia which is being treated with a statin.   Carotid Duplex done today shows 40-59%.  No change compared to last study in 05/29/2018.  No outpatient medications have been marked as taking for the 05/30/19 encounter (Appointment) with Delana Meyer, Dolores Lory, MD.    Past Medical History:  Diagnosis Date  . Anemia    Myelodysplastic syndrome  . Arthritis   . Hypertension   . Leukemia, chronic lymphoid (HCC)    CLL  . Ovarian cancer ([place]) 1963    Past Surgical History:  Procedure Laterality Date  . ABDOMINAL HYSTERECTOMY    . APPENDECTOMY    . BREAST SURGERY     cyst removed from right breast  . EYE SURGERY Bilateral    Cataract Extractions  . IR FLUORO GUIDE CV LINE RIGHT  06/30/2017  . REVERSE SHOULDER ARTHROPLASTY Right 10/28/2014   Procedure: REVERSE SHOULDER ARTHROPLASTY;  Surgeon: Corky Mull, MD;  Location: [facility] ORS;  Service: Orthopedics;  Laterality: Right;    Social History Social History   Tobacco Use  . Smoking status: Former Smoker    Packs/day: 2.00    Types: Cigarettes    Quit date: 1963    Years since quitting: 58.2  . Smokeless tobacco: Never Used  Substance Use Topics  . Alcohol use: Yes    Alcohol/week: 1.0 standard  drinks    Types: 1 Glasses of wine per week    Comment: 1 glass of wine daily  . Drug use: No    Family History No family history on file.  No Known Allergies   REVIEW OF SYSTEMS (Negative unless checked)  Constitutional: [] Weight loss  [] Fever  [] Chills Cardiac: [] Chest pain   [] Chest pressure   [] Palpitations   [] Shortness of breath when laying flat   [] Shortness of breath with exertion. Vascular:  [] Pain in legs with walking   [] Pain in legs at rest  [] History of DVT   [] Phlebitis   [] Swelling in legs   [] Varicose veins   [] Non-healing ulcers Pulmonary:   [] Uses home oxygen   [] Productive cough   [] Hemoptysis   [] Wheeze  [] COPD   [] Asthma Neurologic:  [] Dizziness   [] Seizures   [] History of stroke   [] History of TIA  [] Aphasia   [] Vissual changes   [] Weakness or numbness in arm   [x] Weakness or numbness in leg Musculoskeletal:   [] Joint swelling   [] Joint pain   [] Low back pain Hematologic:  [] Easy bruising  [] Easy bleeding   [] Hypercoagulable state   [] Anemic Gastrointestinal:  [] Diarrhea   [] Vomiting  [] Gastroesophageal reflux/heartburn   [] Difficulty swallowing. Genitourinary:  [] Chronic kidney disease   [] Difficult urination  [] Frequent urination   [] Blood in urine Skin:  [] Rashes   [] Ulcers  Psychological:  [] History of  anxiety   []  History of major depression.  Physical Examination  There were no vitals filed for this visit. There is no height or weight on file to calculate BMI. Gen: WD/WN, NAD Head: /AT, No temporalis wasting.  Ear/Nose/Throat: Hearing grossly intact, nares w/o erythema or drainage Eyes: PER, EOMI, sclera nonicteric.  Neck: Supple, no large masses.   Pulmonary:  Good air movement, no audible wheezing bilaterally, no use of accessory muscles.  Cardiac: RRR, no JVD Vascular: bilateral carotid bruit Vessel Right Left  Radial Palpable Palpable  Brachial Palpable Palpable  Carotid Palpable Palpable  Gastrointestinal: Non-distended. No guarding/no  peritoneal signs.  Musculoskeletal: M/S 5/5 throughout.  No deformity or atrophy.  Neurologic: CN 2-12 intact. Symmetrical.  Speech is fluent. Motor exam as listed above. Psychiatric: Judgment intact, Mood & affect appropriate for pt's clinical situation. Dermatologic: No rashes or ulcers noted.  No changes consistent with cellulitis.  CBC Lab Results  Component Value Date   WBC 4.9 05/23/2019   HGB 7.4 (L) 05/23/2019   HCT 23.6 (L) 05/23/2019   MCV 106.3 (H) 05/23/2019   PLT 268 05/23/2019    BMET    Component Value Date/Time   NA 137 05/23/2019 0940   NA 140 06/26/2013 1044   K 2.9 (L) 05/23/2019 0940   K 3.7 06/26/2013 1044   CL 104 05/23/2019 0940   CL 102 06/26/2013 1044   CO2 21 (L) 05/23/2019 0940   CO2 32 06/26/2013 1044   GLUCOSE 125 (H) 05/23/2019 0940   GLUCOSE 121 (H) 06/26/2013 1044   BUN 17 05/23/2019 0940   BUN 16 06/26/2013 1044   CREATININE 0.62 05/23/2019 0940   CREATININE 1.01 01/06/2014 0828   CALCIUM 8.7 (L) 05/23/2019 0940   CALCIUM 9.0 06/26/2013 1044   GFRNONAA >60 05/23/2019 0940   GFRNONAA 56 (L) 01/06/2014 0828   GFRNONAA >60 06/26/2013 1044   GFRAA >60 05/23/2019 0940   GFRAA >60 01/06/2014 0828   GFRAA >60 06/26/2013 1044   Estimated Creatinine Clearance: 35.6 mL/min (by C-G formula based on SCr of 0.62 mg/dL).  COAG Lab Results  Component Value Date   INR 1.01 06/30/2017   INR 1.02 10/15/2014   INR 1.1 10/21/2012    Radiology No results found.   Assessment/Plan 1. Carotid stenosis, bilateral Recommend:  Given the patient's asymptomatic subcritical stenosis no further invasive testing or surgery at this time.  Duplex ultrasound shows 40-59% stenosis bilaterally.  Continue antiplatelet therapy as prescribed Continue management of CAD, HTN and Hyperlipidemia Healthy heart diet,  encouraged exercise at least 4 times per week Follow up in 12 months with duplex ultrasound and physical exam  - VAS US CAROTID; Future  2.  Benign essential hypertension Continue antihypertensive medications as already ordered, these medications have been reviewed and there are no changes at this time.   3. DM type 2 with diabetic mixed hyperlipidemia ([place]) Continue hypoglycemic medications as already ordered, these medications have been reviewed and there are no changes at this time.  Hgb A1C to be monitored as already arranged by primary service   4. Hyperlipidemia, unspecified hyperlipidemia type Continue statin as ordered and reviewed, no changes at this time   Hortencia Pilar, MD  05/30/2019 9:17 AM

## 2019-06-06 ENCOUNTER — Inpatient Hospital Stay (HOSPITAL_BASED_OUTPATIENT_CLINIC_OR_DEPARTMENT_OTHER): Payer: Medicare Other | Admitting: Oncology

## 2019-06-06 ENCOUNTER — Other Ambulatory Visit: Payer: Self-pay

## 2019-06-06 ENCOUNTER — Encounter: Payer: Self-pay | Admitting: Oncology

## 2019-06-06 ENCOUNTER — Inpatient Hospital Stay: Payer: Medicare Other

## 2019-06-06 VITALS — BP 188/63 | HR 91 | Temp 97.8°F | Resp 18 | Wt 105.0 lb

## 2019-06-06 DIAGNOSIS — I6523 Occlusion and stenosis of bilateral carotid arteries: Secondary | ICD-10-CM | POA: Diagnosis not present

## 2019-06-06 DIAGNOSIS — D46C Myelodysplastic syndrome with isolated del(5q) chromosomal abnormality: Secondary | ICD-10-CM

## 2019-06-06 LAB — CBC WITH DIFFERENTIAL/PLATELET
Abs Immature Granulocytes: 0.03 10*3/uL (ref 0.00–0.07)
Basophils Absolute: 0.1 10*3/uL (ref 0.0–0.1)
Basophils Relative: 3 %
Eosinophils Absolute: 0.3 10*3/uL (ref 0.0–0.5)
Eosinophils Relative: 7 %
HCT: 21.8 % — ABNORMAL LOW (ref 36.0–46.0)
Hemoglobin: 7.2 g/dL — ABNORMAL LOW (ref 12.0–15.0)
Immature Granulocytes: 1 %
Lymphocytes Relative: 20 %
Lymphs Abs: 0.9 10*3/uL (ref 0.7–4.0)
MCH: 36 pg — ABNORMAL HIGH (ref 26.0–34.0)
MCHC: 33 g/dL (ref 30.0–36.0)
MCV: 109 fL — ABNORMAL HIGH (ref 80.0–100.0)
Monocytes Absolute: 0.5 10*3/uL (ref 0.1–1.0)
Monocytes Relative: 13 %
Neutro Abs: 2.4 10*3/uL (ref 1.7–7.7)
Neutrophils Relative %: 56 %
Platelets: 326 10*3/uL (ref 150–400)
RBC: 2 MIL/uL — ABNORMAL LOW (ref 3.87–5.11)
RDW: 25.8 % — ABNORMAL HIGH (ref 11.5–15.5)
WBC: 4.3 10*3/uL (ref 4.0–10.5)
nRBC: 0 % (ref 0.0–0.2)

## 2019-06-06 LAB — COMPREHENSIVE METABOLIC PANEL
ALT: 27 U/L (ref 0–44)
AST: 23 U/L (ref 15–41)
Albumin: 4.4 g/dL (ref 3.5–5.0)
Alkaline Phosphatase: 78 U/L (ref 38–126)
Anion gap: 9 (ref 5–15)
BUN: 18 mg/dL (ref 8–23)
CO2: 22 mmol/L (ref 22–32)
Calcium: 9.3 mg/dL (ref 8.9–10.3)
Chloride: 107 mmol/L (ref 98–111)
Creatinine, Ser: 0.74 mg/dL (ref 0.44–1.00)
GFR calc Af Amer: >60 mL/min (ref >60)
GFR calc non Af Amer: >60 mL/min (ref >60)
Glucose, Bld: 140 mg/dL — ABNORMAL HIGH (ref 70–99)
Potassium: 4.1 mmol/L (ref 3.5–5.1)
Sodium: 138 mmol/L (ref 135–145)
Total Bilirubin: 0.6 mg/dL (ref 0.3–1.2)
Total Protein: 6.5 g/dL (ref 6.5–8.1)

## 2019-06-06 LAB — MAGNESIUM: Magnesium: 2.2 mg/dL (ref 1.7–2.4)

## 2019-06-06 NOTE — Patient Instructions (Signed)
Increase your mirtazapine (Remeron) from 7.5mg  nightly to 15 mg nightly (2 pills at bedtime).   Continue your potassium supplements as prescribed.  Everything else stays the same.  We will see you back in 2 weeks for lab work.   Faythe Casa, NP 06/06/2019 10:27 AM

## 2019-06-06 NOTE — Progress Notes (Signed)
Symptom Management Consult note [facility]  [phone] [phone]  Patient Care Team: [name], MD as PCP - General (Internal Medicine) Lloyd Huger, MD as Consulting Physician (Hematology and Oncology)   Name of the patient: Jacqueline Russo  [id number]  Nov 10, 1929   Date of visit: 06/06/2019   Diagnosis-MDS  Chief complaint/ Reason for visit-follow-up  Heme/Onc history:  Oncology History Overview Note  History of MDS, 5q deletion.  Patient refused treatment with Revlimid.  Underwent a bone marrow biopsy on 06/30/2017 which showed hypercellular marrow 50% with dyspoiesis and increased blasts ~6% consistent with myelodysplastic syndrome.  Given history of MDS with isolated deletion (5q), presence of increased blasts likely indicative of progressive disease.  Now transfusion dependent.  She continued to refuse Revlimid.  Previously had discussed possibility of progressing to AML but patient declined further interventions beyond periodic blood transfusions.   History of iron overload and she receives Desferal prior to transfusions.   Patient has history of CLL and received single agent Rituxan in February 2015.  Previous bone marrow biopsy did not mention any involvement of CLL.    Oncology History     CLL (chronic lymphocytic leukemia) ([place])  11/16/2015 Initial Diagnosis   CLL (chronic lymphocytic leukemia) ([place])    Interval history-patient presents to symptom management today for follow-up and repeat lab work.  She was evaluated by Vonna Kotyk borders and Beckey Rutter for symptomatic anemia where she received 1 unit of packed red blood cells for hemoglobin of 6.2.  Lab work showed persistent hyperkalemia and hypomagnesemia.  She was started on supplementation.  She was interested in speaking to our dietitian to trial oral supplements although she did not believe she could tolerate them.  She was started on mirtazapine 7.5 mg at  bedtime.  In the interim, she has been doing well.  Her appetite has not improved much but admits to not being a big eater.  She has been taking her supplements as prescribed although her potassium pills are "way too big "for her to swallow so she cuts them in half.  She continues to feel fatigued.  She admits to not wanting to do anything.  This is been going on for about 6 months.  Previously she enjoyed golfing, Firefighter and cooking.  She feels like her quality of life has diminished quite a bit.  She thinks she may be mildly depressed.  She takes Xanax as needed.  She denies any problems getting around on her own.  She lives at home alone.  She denies any falls.   ECOG FS:2 - Symptomatic, <50% confined to bed  Review of systems- Review of Systems  Constitutional: Positive for malaise/fatigue. Negative for chills, fever and weight loss.  HENT: Negative for congestion, ear pain and tinnitus.   Eyes: Negative.  Negative for blurred vision and double vision.  Respiratory: Negative.  Negative for cough, sputum production and shortness of breath.   Cardiovascular: Negative.  Negative for chest pain, palpitations and leg swelling.  Gastrointestinal: Negative.  Negative for abdominal pain, constipation, diarrhea, nausea and vomiting.  Genitourinary: Negative for dysuria, frequency and urgency.  Musculoskeletal: Negative for back pain and falls.  Skin: Negative.  Negative for rash.  Neurological: Positive for weakness. Negative for headaches.  Endo/Heme/Allergies: Bruises/bleeds easily.  Psychiatric/Behavioral: Positive for depression. The patient is not nervous/anxious and does not have insomnia.      Current treatment-currently not on treatment.  Receives packed red blood cell transfusions as  needed.  Allergies  Allergen Reactions  . Ciprofloxacin Nausea Only     Past Medical History:  Diagnosis Date  . Anemia    Myelodysplastic syndrome  . Arthritis   . Hypertension   . Leukemia,  chronic lymphoid (HCC)    CLL  . Ovarian cancer ([place]) 1963     Past Surgical History:  Procedure Laterality Date  . ABDOMINAL HYSTERECTOMY    . APPENDECTOMY    . BREAST SURGERY     cyst removed from right breast  . EYE SURGERY Bilateral    Cataract Extractions  . IR FLUORO GUIDE CV LINE RIGHT  06/30/2017  . REVERSE SHOULDER ARTHROPLASTY Right 10/28/2014   Procedure: REVERSE SHOULDER ARTHROPLASTY;  Surgeon: Corky Mull, MD;  Location: [facility] ORS;  Service: Orthopedics;  Laterality: Right;    Social History   Socioeconomic History  . Marital status: Widowed    Spouse name: Not on file  . Number of children: Not on file  . Years of education: Not on file  . Highest education level: Not on file  Occupational History  . Not on file  Tobacco Use  . Smoking status: Former Smoker    Packs/day: 2.00    Types: Cigarettes    Quit date: 1963    Years since quitting: 58.2  . Smokeless tobacco: Never Used  Substance and Sexual Activity  . Alcohol use: Yes    Alcohol/week: 1.0 standard drinks    Types: 1 Glasses of wine per week    Comment: 1 glass of wine daily  . Drug use: No  . Sexual activity: Not on file  Other Topics Concern  . Not on file  Social History Narrative  . Not on file   Social Determinants of Health   Financial Resource Strain:   . Difficulty of Paying Living Expenses:   Food Insecurity:   . Worried About Charity fundraiser in the Last Year:   . Arboriculturist in the Last Year:   Transportation Needs:   . Film/video editor (Medical):   Marland Kitchen Lack of Transportation (Non-Medical):   Physical Activity:   . Days of Exercise per Week:   . Minutes of Exercise per Session:   Stress:   . Feeling of Stress :   Social Connections:   . Frequency of Communication with Friends and Family:   . Frequency of Social Gatherings with Friends and Family:   . Attends Religious Services:   . Active Member of Clubs or Organizations:   . Attends Archivist  Meetings:   Marland Kitchen Marital Status:   Intimate Partner Violence:   . Fear of Current or Ex-Partner:   . Emotionally Abused:   Marland Kitchen Physically Abused:   . Sexually Abused:     History reviewed. No pertinent family history.   Current Outpatient Medications:  .  ALPRAZolam (XANAX) 0.25 MG tablet, Take 1 tablet (0.25 mg total) by mouth daily as needed for anxiety., Disp: 90 tablet, Rfl: 0 .  aspirin 81 MG tablet, Take 81 mg by mouth daily., Disp: , Rfl:  .  cyanocobalamin (,VITAMIN B-12,) 1000 MCG/ML injection, Inject into the muscle., Disp: , Rfl:  .  diltiazem (CARDIZEM CD) 240 MG 24 hr capsule, Take 1 capsule by mouth at bedtime., Disp: , Rfl:  .  diphenhydrAMINE (BENADRYL) 50 MG tablet, Take 50 mg by mouth at bedtime as needed for itching., Disp: , Rfl:  .  diphenhydrAMINE (SOMINEX) 25 MG tablet, Take 25 mg by  mouth at bedtime., Disp: , Rfl:  .  HYDROcodone-acetaminophen (NORCO/VICODIN) 5-325 MG tablet, Take 1-2 tablets by mouth every 4 (four) hours as needed for moderate pain or severe pain., Disp: 90 tablet, Rfl: 0 .  lidocaine-prilocaine (EMLA) cream, Apply 1 application topically as needed. Apply to port 1-2 hours prior to appointment. Cover with plastic wrap., Disp: 30 g, Rfl: 0 .  magnesium oxide (MAG-OX) 400 (241.3 Mg) MG tablet, Take 1 tablet (400 mg total) by mouth daily., Disp: 30 tablet, Rfl: 0 .  mirtazapine (REMERON) 7.5 MG tablet, Take 1 tablet (7.5 mg total) by mouth at bedtime., Disp: 30 tablet, Rfl: 2 .  Multiple Vitamins-Minerals (PRESERVISION AREDS 2+MULTI VIT PO), Take 1 tablet by mouth 1 day or 1 dose., Disp: , Rfl:  .  ondansetron (ZOFRAN) 8 MG tablet, Take 1 tablet (8 mg total) by mouth every 8 (eight) hours as needed for nausea or vomiting., Disp: 20 tablet, Rfl: 0 .  potassium chloride SA (KLOR-CON) 20 MEQ tablet, Take 1 tablet (20 mEq total) by mouth daily., Disp: 10 tablet, Rfl: 0 .  tiZANidine (ZANAFLEX) 2 MG tablet, Take 1 tablet (2 mg total) by mouth at bedtime as  needed for muscle spasms., Disp: 30 tablet, Rfl: 0 No current facility-administered medications for this visit.  Facility-Administered Medications Ordered in Other Visits:  .  heparin lock flush 100 unit/mL, 500 Units, Intravenous, Once, Finnegan, Kathlene November, MD .  sodium chloride flush (NS) 0.9 % injection 10 mL, 10 mL, Intravenous, PRN, Lloyd Huger, MD, 10 mL at 09/05/18 0822  Physical exam:  Vitals:   06/06/19 0938 06/06/19 0939  BP:  (!) 188/63  Pulse:  91  Resp:  18  Temp:  97.8 F (36.6 C)  TempSrc:  Tympanic  Weight: 105 lb (47.6 kg)    Physical Exam Constitutional:      Appearance: Normal appearance.  HENT:     Head: Normocephalic and atraumatic.  Eyes:     Pupils: Pupils are equal, round, and reactive to light.  Cardiovascular:     Rate and Rhythm: Normal rate and regular rhythm.     Heart sounds: Normal heart sounds. No murmur.  Pulmonary:     Effort: Pulmonary effort is normal.     Breath sounds: Normal breath sounds. No wheezing.  Abdominal:     General: Bowel sounds are normal. There is no distension.     Palpations: Abdomen is soft.     Tenderness: There is no abdominal tenderness.  Musculoskeletal:        General: Normal range of motion.     Cervical back: Normal range of motion.  Skin:    General: Skin is warm and dry.     Findings: No rash.  Neurological:     Mental Status: She is alert and oriented to person, place, and time.  Psychiatric:        Judgment: Judgment normal.      CMP Latest Ref Rng & Units 06/06/2019  Glucose 70 - 99 mg/dL 140(H)  BUN 8 - 23 mg/dL 18  Creatinine 0.44 - 1.00 mg/dL 0.74  Sodium 135 - 145 mmol/L 138  Potassium 3.5 - 5.1 mmol/L 4.1  Chloride 98 - 111 mmol/L 107  CO2 22 - 32 mmol/L 22  Calcium 8.9 - 10.3 mg/dL 9.3  Total Protein 6.5 - 8.1 g/dL 6.5  Total Bilirubin 0.3 - 1.2 mg/dL 0.6  Alkaline Phos 38 - 126 U/L 78  AST 15 - 41 U/L 23  ALT 0 - 44 U/L 27   CBC Latest Ref Rng & Units 06/06/2019  WBC 4.0 -  10.5 K/uL 4.3  Hemoglobin 12.0 - 15.0 g/dL 7.2(L)  Hematocrit 36.0 - 46.0 % 21.8(L)  Platelets 150 - 400 K/uL 326    No images are attached to the encounter.  VAS US CAROTID  Result Date: 05/30/2019 Carotid Arterial Duplex Study Indications:       Carotid artery disease and CVD. Comparison Study:  05/29/2018 Performing Technologist: Almira Coaster RVS  Examination Guidelines: A complete evaluation includes B-mode imaging, spectral Doppler, color Doppler, and power Doppler as needed of all accessible portions of each vessel. Bilateral testing is considered an integral part of a complete examination. Limited examinations for reoccurring indications may be performed as noted.  Right Carotid Findings: +----------+--------+--------+--------+------------------+--------+           PSV cm/sEDV cm/sStenosisPlaque DescriptionComments +----------+--------+--------+--------+------------------+--------+ CCA Prox  120     16                                         +----------+--------+--------+--------+------------------+--------+ CCA Mid   154     24                                         +----------+--------+--------+--------+------------------+--------+ CCA Distal127     22                                         +----------+--------+--------+--------+------------------+--------+ ICA Prox  230     31                                         +----------+--------+--------+--------+------------------+--------+ ICA Mid   182     22                                         +----------+--------+--------+--------+------------------+--------+ ICA Distal147     23                                         +----------+--------+--------+--------+------------------+--------+ ECA       397     0                                          +----------+--------+--------+--------+------------------+--------+ +----------+--------+-------+--------+-------------------+           PSV  cm/sEDV cmsDescribeArm Pressure (mmHG) +----------+--------+-------+--------+-------------------+ Subclavian219     0                                  +----------+--------+-------+--------+-------------------+ +---------+--------+---+--------+--+ VertebralPSV cm/s151EDV cm/s19 +---------+--------+---+--------+--+  Left Carotid Findings: +----------+--------+--------+--------+------------------+--------+           PSV cm/sEDV cm/sStenosisPlaque DescriptionComments +----------+--------+--------+--------+------------------+--------+ CCA Prox  155     22                                         +----------+--------+--------+--------+------------------+--------+  CCA Mid   137     21                                         +----------+--------+--------+--------+------------------+--------+ CCA Distal136     27                                         +----------+--------+--------+--------+------------------+--------+ ICA Prox  242     29                                         +----------+--------+--------+--------+------------------+--------+ ICA Mid   158     24                                         +----------+--------+--------+--------+------------------+--------+ ICA Distal166     33                                         +----------+--------+--------+--------+------------------+--------+ ECA       366     19                                         +----------+--------+--------+--------+------------------+--------+ +----------+--------+--------+--------+-------------------+           PSV cm/sEDV cm/sDescribeArm Pressure (mmHG) +----------+--------+--------+--------+-------------------+ [id number]     0                                   +----------+--------+--------+--------+-------------------+ +---------+--------+---+--------+--+ VertebralPSV cm/s109EDV cm/s18 +---------+--------+---+--------+--+   Summary: Right Carotid:  Velocities in the right ICA are consistent with a 40-59%                stenosis. The ECA appears >50% stenosed. Left Carotid: Velocities in the left ICA are consistent with a 40-59% stenosis.               The ECA appears >50% stenosed. Vertebrals:  Bilateral vertebral arteries demonstrate antegrade flow. Subclavians: Normal flow hemodynamics were seen in bilateral subclavian              arteries. *See table(s) above for measurements and observations.  Electronically signed by Hortencia Pilar MD on 05/30/2019 at 5:03:04 PM.    Final      Assessment and plan- Patient is a 86 y.o. female who presents to symptom management for follow-up and repeat lab draw.  Mrs. Amirault appears to be doing well.  She does not believe that the mirtazapine is helping tremendously but is okay continuing the medication.  I spoke with Altha Harm who recommends increasing her mirtazapine to 15 mg at bedtime to see if this could help with some of her depression symptoms as well as her appetite.  She was agreeable to this.  She denies any increased fatigue with the use of this medication.  She continues to try to eat several small meals throughout  the day.  Lab work today is stable.  She will not need a blood transfusion.  Her potassium and magnesium levels have improved dramatically.  I have asked her to continue her medications until she completes them.  She is scheduled to return to clinic in about 2 weeks to see Dr. Grayland Ormond.  Plan: Increase mirtazapine to 15 mg at bedtime.  Disposition: RTC on 06/19/2019 for lab work and on 06/20/2019 MD assessment and possible blood transfusion.   Visit Diagnosis 1. MDS (myelodysplastic syndrome) with 5q deletion [facility])     Patient expressed understanding and was in agreement with this plan. She also understands that She can call clinic at any time with any questions, concerns, or complaints.   Greater than 50% was spent in counseling and coordination of care with this patient  including but not limited to discussion of the relevant topics above (See A&P) including, but not limited to diagnosis and management of acute and chronic medical conditions.   Thank you for allowing me to participate in the care of this very pleasant patient.    Jacquelin Hawking, NP [place] [facility] Cell - [phone] Pager- [phone] 06/06/2019 3:54 PM

## 2019-06-11 ENCOUNTER — Other Ambulatory Visit: Payer: Self-pay | Admitting: *Deleted

## 2019-06-11 MED ORDER — ALPRAZOLAM 0.25 MG PO TABS: 0.2500 mg | ORAL_TABLET | Freq: Every day | ORAL | 0 refills | Status: DC | PRN

## 2019-06-15 NOTE — Progress Notes (Signed)
[facility]  [phone] [phone]  ID: Jacqueline Russo Born OB: 07/08/1929  MR#: [id number]  NLG#:[id number]  Patient Care Team: [name], MD as PCP - General (Internal Medicine) Lloyd Huger, MD as Consulting Physician (Hematology and Oncology)  CHIEF COMPLAINT: MDS 5q-, CLL  INTERVAL HISTORY: Patient returns to clinic today for further evaluation and treatment with Desferal and 1 unit of packed red blood cells.  She continues to have chronic weakness and fatigue.  She also is complaining of chronic back pain and worsening eyesight.  She has no neurologic complaints. She denies any recent fevers or illnesses. She denies any night sweats or weight loss.  She denies any chest pain, shortness of breath, cough, or hemoptysis.  She denies any nausea, vomiting, constipation, or diarrhea. She denies any melena or hematochezia. She has no urinary complaints.  Patient offers no further specific complaints today.  REVIEW OF SYSTEMS:   Review of Systems  Constitutional: Positive for malaise/fatigue. Negative for fever and weight loss.  Eyes: Negative.  Negative for blurred vision, double vision and pain.  Respiratory: Negative.  Negative for cough and shortness of breath.   Cardiovascular: Negative.  Negative for chest pain and leg swelling.  Gastrointestinal: Negative.  Negative for abdominal pain, blood in stool and melena.  Genitourinary: Negative.  Negative for dysuria.  Musculoskeletal: Positive for back pain. Negative for joint pain.  Skin: Negative.  Negative for rash.  Neurological: Positive for weakness. Negative for dizziness, sensory change, focal weakness and headaches.  Endo/Heme/Allergies: Does not bruise/bleed easily.  Psychiatric/Behavioral: Negative.  The patient is not nervous/anxious.     As per HPI. Otherwise, a complete review of systems is negative.  PAST MEDICAL HISTORY: Past Medical History:  Diagnosis Date  . Anemia    Myelodysplastic syndrome  . Arthritis   . Hypertension   . Leukemia, chronic lymphoid (HCC)    CLL  . Ovarian cancer ([place]) 1963    PAST SURGICAL HISTORY: Past Surgical History:  Procedure Laterality Date  . ABDOMINAL HYSTERECTOMY    . APPENDECTOMY    . BREAST SURGERY     cyst removed from right breast  . EYE SURGERY Bilateral    Cataract Extractions  . IR FLUORO GUIDE CV LINE RIGHT  06/30/2017  . REVERSE SHOULDER ARTHROPLASTY Right 10/28/2014   Procedure: REVERSE SHOULDER ARTHROPLASTY;  Surgeon: Corky Mull, MD;  Location: [facility] ORS;  Service: Orthopedics;  Laterality: Right;    FAMILY HISTORY: Reviewed and unchanged. No reported history of malignancy or chronic disease.     ADVANCED DIRECTIVES:    HEALTH MAINTENANCE: Social History   Tobacco Use  . Smoking status: Former Smoker    Packs/day: 2.00    Types: Cigarettes    Quit date: 1963    Years since quitting: 58.2  . Smokeless tobacco: Never Used  Substance Use Topics  . Alcohol use: Not Currently    Alcohol/week: 1.0 standard drinks    Types: 1 Glasses of wine per week    Comment: 1 glass of wine daily  . Drug use: No     Colonoscopy:  PAP:  Bone density:  Lipid panel:  Allergies  Allergen Reactions  . Ciprofloxacin Nausea Only    Current Outpatient Medications  Medication Sig Dispense Refill  . ALPRAZolam (XANAX) 0.25 MG tablet Take 1 tablet (0.25 mg total) by mouth daily as needed for anxiety. 90 tablet 0  . aspirin 81 MG tablet Take 81 mg by mouth daily.    Marland Kitchen  cyanocobalamin (,VITAMIN B-12,) 1000 MCG/ML injection Inject into the muscle.    . diltiazem (CARDIZEM CD) 240 MG 24 hr capsule Take 1 capsule by mouth at bedtime.    . diphenhydrAMINE (BENADRYL) 50 MG tablet Take 50 mg by mouth at bedtime as needed for itching.    . diphenhydrAMINE (SOMINEX) 25 MG tablet Take 25 mg by mouth at bedtime.    Marland Kitchen etodolac (LODINE) 400 MG tablet Take 400 mg by mouth daily.    Marland Kitchen HYDROcodone-acetaminophen  (NORCO/VICODIN) 5-325 MG tablet Take 1-2 tablets by mouth every 4 (four) hours as needed for moderate pain or severe pain. 90 tablet 0  . lidocaine-prilocaine (EMLA) cream Apply 1 application topically as needed. Apply to port 1-2 hours prior to appointment. Cover with plastic wrap. 30 g 0  . magnesium oxide (MAG-OX) 400 (241.3 Mg) MG tablet Take 1 tablet (400 mg total) by mouth daily. 30 tablet 0  . mirtazapine (REMERON) 7.5 MG tablet Take 1 tablet (7.5 mg total) by mouth at bedtime. 30 tablet 2  . Multiple Vitamins-Minerals (PRESERVISION AREDS 2+MULTI VIT PO) Take 1 tablet by mouth 1 day or 1 dose.    . ondansetron (ZOFRAN) 8 MG tablet Take 1 tablet (8 mg total) by mouth every 8 (eight) hours as needed for nausea or vomiting. 20 tablet 0  . potassium chloride SA (KLOR-CON) 20 MEQ tablet Take 1 tablet (20 mEq total) by mouth daily. 10 tablet 0  . tiZANidine (ZANAFLEX) 2 MG tablet Take 1 tablet (2 mg total) by mouth at bedtime as needed for muscle spasms. 30 tablet 0   No current facility-administered medications for this visit.   Facility-Administered Medications Ordered in Other Visits  Medication Dose Route Frequency Provider Last Rate Last Admin  . 0.9 %  sodium chloride infusion (Manually program via Guardrails IV Fluids)  250 mL Intravenous Once Lloyd Huger, MD      . acetaminophen (TYLENOL) tablet 650 mg  650 mg Oral Once Lloyd Huger, MD      . diphenhydrAMINE (BENADRYL) injection 25 mg  25 mg Intravenous Once Lloyd Huger, MD      . heparin lock flush 100 unit/mL  500 Units Intravenous Once Lloyd Huger, MD      . heparin lock flush 100 unit/mL  500 Units Intracatheter Daily PRN Lloyd Huger, MD      . sodium chloride flush (NS) 0.9 % injection 10 mL  10 mL Intravenous PRN Lloyd Huger, MD   10 mL at 09/05/18 5329    OBJECTIVE: There were no vitals filed for this visit.   There is no height or weight on file to calculate BMI.    ECOG FS:1 -  Symptomatic but completely ambulatory  General: Thin, no acute distress. Eyes: Pink conjunctiva, anicteric sclera. HEENT: Normocephalic, moist mucous membranes. Lungs: No audible wheezing or coughing. Heart: Regular rate and rhythm. Abdomen: Soft, nontender, no obvious distention. Musculoskeletal: No edema, cyanosis, or clubbing. Neuro: Alert, answering all questions appropriately. Cranial nerves grossly intact. Skin: No rashes or petechiae noted. Psych: Normal affect.   LAB RESULTS:  Lab Results  Component Value Date   NA 138 06/06/2019   K 4.1 06/06/2019   CL 107 06/06/2019   CO2 22 06/06/2019   GLUCOSE 140 (H) 06/06/2019   BUN 18 06/06/2019   CREATININE 0.74 06/06/2019   CALCIUM 9.3 06/06/2019   PROT 6.5 06/06/2019   ALBUMIN 4.4 06/06/2019   AST 23 06/06/2019   ALT 27  06/06/2019   ALKPHOS 78 06/06/2019   BILITOT 0.6 06/06/2019   GFRNONAA >60 06/06/2019   GFRAA >60 06/06/2019    Lab Results  Component Value Date   WBC 8.1 06/19/2019   NEUTROABS 5.5 06/19/2019   HGB 7.0 (L) 06/19/2019   HCT 21.5 (L) 06/19/2019   MCV 110.3 (H) 06/19/2019   PLT 378 06/19/2019   Lab Results  Component Value Date   IRON 129 01/16/2019   TIBC 322 01/16/2019   IRONPCTSAT 40 (H) 01/16/2019   Lab Results  Component Value Date   FERRITIN 1,063 (H) 01/16/2019     STUDIES: VAS US CAROTID  Result Date: 05/30/2019 Carotid Arterial Duplex Study Indications:       Carotid artery disease and CVD. Comparison Study:  05/29/2018 Performing Technologist: Almira Coaster RVS  Examination Guidelines: A complete evaluation includes B-mode imaging, spectral Doppler, color Doppler, and power Doppler as needed of all accessible portions of each vessel. Bilateral testing is considered an integral part of a complete examination. Limited examinations for reoccurring indications may be performed as noted.  Right Carotid Findings: +----------+--------+--------+--------+------------------+--------+            PSV cm/sEDV cm/sStenosisPlaque DescriptionComments +----------+--------+--------+--------+------------------+--------+ CCA Prox  120     16                                         +----------+--------+--------+--------+------------------+--------+ CCA Mid   154     24                                         +----------+--------+--------+--------+------------------+--------+ CCA Distal127     22                                         +----------+--------+--------+--------+------------------+--------+ ICA Prox  230     31                                         +----------+--------+--------+--------+------------------+--------+ ICA Mid   182     22                                         +----------+--------+--------+--------+------------------+--------+ ICA Distal147     23                                         +----------+--------+--------+--------+------------------+--------+ ECA       397     0                                          +----------+--------+--------+--------+------------------+--------+ +----------+--------+-------+--------+-------------------+           PSV cm/sEDV cmsDescribeArm Pressure (mmHG) +----------+--------+-------+--------+-------------------+ Subclavian219     0                                  +----------+--------+-------+--------+-------------------+ +---------+--------+---+--------+--+  VertebralPSV cm/s151EDV cm/s19 +---------+--------+---+--------+--+  Left Carotid Findings: +----------+--------+--------+--------+------------------+--------+           PSV cm/sEDV cm/sStenosisPlaque DescriptionComments +----------+--------+--------+--------+------------------+--------+ CCA Prox  155     22                                         +----------+--------+--------+--------+------------------+--------+ CCA Mid   137     21                                          +----------+--------+--------+--------+------------------+--------+ CCA Distal136     27                                         +----------+--------+--------+--------+------------------+--------+ ICA Prox  242     29                                         +----------+--------+--------+--------+------------------+--------+ ICA Mid   158     24                                         +----------+--------+--------+--------+------------------+--------+ ICA Distal166     33                                         +----------+--------+--------+--------+------------------+--------+ ECA       366     19                                         +----------+--------+--------+--------+------------------+--------+ +----------+--------+--------+--------+-------------------+           PSV cm/sEDV cm/sDescribeArm Pressure (mmHG) +----------+--------+--------+--------+-------------------+ [id number]     0                                   +----------+--------+--------+--------+-------------------+ +---------+--------+---+--------+--+ VertebralPSV cm/s109EDV cm/s18 +---------+--------+---+--------+--+   Summary: Right Carotid: Velocities in the right ICA are consistent with a 40-59%                stenosis. The ECA appears >50% stenosed. Left Carotid: Velocities in the left ICA are consistent with a 40-59% stenosis.               The ECA appears >50% stenosed. Vertebrals:  Bilateral vertebral arteries demonstrate antegrade flow. Subclavians: Normal flow hemodynamics were seen in bilateral subclavian              arteries. *See table(s) above for measurements and observations.  Electronically signed by Hortencia Pilar MD on 05/30/2019 at 5:03:04 PM.    Final     ASSESSMENT: MDS 5q-, CLL   PLAN:    1. MDS, 5q-: Bone marrow biopsy results from June 30, 2017 reviewed independently confirming progression of patient's MDS, 5q-. She has a hypercellular marrow  for age as well  as an increased blast count up to 6%.  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  Patient continues to decline treatment with Revlimid.  Previously, we also discussed the possibility of progressing to AML, but patient wishes no further interventions other than periodic blood transfusions.  Patient's hemoglobin remains decreased at 7.0, therefore will proceed with 1 unit of packed red blood cells today.  She also requires Desferal with every infusion.  Return to clinic in 4 weeks for laboratory work on day 1 and then further evaluation, blood transfusion, and Desferal on day 2. 2.  Elevated iron stores: Chronic and unchanged.  Desferal as above.   3.  CLL: Bone marrow biopsy did not mention any involvement of CLL.  White blood cell count is within normal limits today.  CT scan results from June 09, 2014 were independently reviewed and did not reveal any evidence of recurrent disease.  Previously, all of her other labwork was either negative or within normal limits. Patient last received single agent Rituxan in February 2015.  4.  Anxiety: Chronic and unchanged.  Continue Xanax as needed. 6.  Hypertension: Chronic and unchanged.  Unclear her compliance with her medications.  Continue follow-up and treatment per primary care. 5.  Back pain: Worse.  Patient reports she now requires a walker.  I spent a total of 30 minutes reviewing chart data, face-to-face evaluation with the patient, counseling and coordination of care as detailed above.   Patient expressed understanding and was in agreement with this plan. She also understands that She can call clinic at any time with any questions, concerns, or complaints.   Lloyd Huger, MD   06/20/2019 11:05 AM

## 2019-06-18 ENCOUNTER — Other Ambulatory Visit: Payer: Self-pay | Admitting: *Deleted

## 2019-06-18 ENCOUNTER — Other Ambulatory Visit: Payer: Self-pay | Admitting: Emergency Medicine

## 2019-06-18 DIAGNOSIS — D46C Myelodysplastic syndrome with isolated del(5q) chromosomal abnormality: Secondary | ICD-10-CM

## 2019-06-18 MED ORDER — HYDROCODONE-ACETAMINOPHEN 5-325 MG PO TABS: 1.0000 | ORAL_TABLET | ORAL | 0 refills | Status: DC | PRN

## 2019-06-18 NOTE — Telephone Encounter (Signed)
Patient called cancer center requesting refill of Norco 5-325mg  q 4 hours prn for pain.   As mandated by the [place] STOP Act (Strengthen Opioid Misuse Prevention), the [place] Controlled Substance Reporting System ([place]) was reviewed for this patient. Below is the past 50-months of controlled substance prescriptions as displayed by the registry. I have personally consulted with my supervising physician, Dr. Grayland Ormond, who agrees that continuation of opiate therapy is medically appropriate at this time and agrees to provide continual monitoring, including urine/blood drug screens, as indicated. Refill is appropriate on or after 06/10/19.  NCCSRS reviewed: reviewed and appropriate    Faythe Casa, NP 06/18/2019 3:43 PM [phone]

## 2019-06-19 ENCOUNTER — Other Ambulatory Visit: Payer: Self-pay | Admitting: Oncology

## 2019-06-19 ENCOUNTER — Inpatient Hospital Stay: Payer: Medicare Other

## 2019-06-19 ENCOUNTER — Encounter: Payer: Self-pay | Admitting: Oncology

## 2019-06-19 ENCOUNTER — Other Ambulatory Visit: Payer: Self-pay

## 2019-06-19 DIAGNOSIS — D46C Myelodysplastic syndrome with isolated del(5q) chromosomal abnormality: Secondary | ICD-10-CM

## 2019-06-19 LAB — CBC WITH DIFFERENTIAL/PLATELET
Abs Immature Granulocytes: 0.11 10*3/uL — ABNORMAL HIGH (ref 0.00–0.07)
Basophils Absolute: 0.2 10*3/uL — ABNORMAL HIGH (ref 0.0–0.1)
Basophils Relative: 2 %
Eosinophils Absolute: 0.4 10*3/uL (ref 0.0–0.5)
Eosinophils Relative: 5 %
HCT: 21.5 % — ABNORMAL LOW (ref 36.0–46.0)
Hemoglobin: 7 g/dL — ABNORMAL LOW (ref 12.0–15.0)
Immature Granulocytes: 1 %
Lymphocytes Relative: 12 %
Lymphs Abs: 1 10*3/uL (ref 0.7–4.0)
MCH: 35.9 pg — ABNORMAL HIGH (ref 26.0–34.0)
MCHC: 32.6 g/dL (ref 30.0–36.0)
MCV: 110.3 fL — ABNORMAL HIGH (ref 80.0–100.0)
Monocytes Absolute: 1 10*3/uL (ref 0.1–1.0)
Monocytes Relative: 12 %
Neutro Abs: 5.5 10*3/uL (ref 1.7–7.7)
Neutrophils Relative %: 68 %
Platelets: 378 10*3/uL (ref 150–400)
RBC: 1.95 MIL/uL — ABNORMAL LOW (ref 3.87–5.11)
WBC: 8.1 10*3/uL (ref 4.0–10.5)
nRBC: 0 % (ref 0.0–0.2)

## 2019-06-19 LAB — PREPARE RBC (CROSSMATCH)

## 2019-06-20 ENCOUNTER — Inpatient Hospital Stay: Payer: Medicare Other | Attending: Oncology | Admitting: Oncology

## 2019-06-20 ENCOUNTER — Inpatient Hospital Stay: Payer: Medicare Other | Attending: Oncology

## 2019-06-20 ENCOUNTER — Inpatient Hospital Stay (HOSPITAL_BASED_OUTPATIENT_CLINIC_OR_DEPARTMENT_OTHER): Payer: Medicare Other | Admitting: Hospice and Palliative Medicine

## 2019-06-20 DIAGNOSIS — Z515 Encounter for palliative care: Secondary | ICD-10-CM | POA: Diagnosis not present

## 2019-06-20 DIAGNOSIS — C9110 Chronic lymphocytic leukemia of B-cell type not having achieved remission: Secondary | ICD-10-CM | POA: Insufficient documentation

## 2019-06-20 DIAGNOSIS — Z7982 Long term (current) use of aspirin: Secondary | ICD-10-CM | POA: Diagnosis not present

## 2019-06-20 DIAGNOSIS — G8929 Other chronic pain: Secondary | ICD-10-CM | POA: Diagnosis not present

## 2019-06-20 DIAGNOSIS — Z90710 Acquired absence of both cervix and uterus: Secondary | ICD-10-CM | POA: Insufficient documentation

## 2019-06-20 DIAGNOSIS — R251 Tremor, unspecified: Secondary | ICD-10-CM | POA: Diagnosis not present

## 2019-06-20 DIAGNOSIS — I6523 Occlusion and stenosis of bilateral carotid arteries: Secondary | ICD-10-CM

## 2019-06-20 DIAGNOSIS — Z87891 Personal history of nicotine dependence: Secondary | ICD-10-CM | POA: Diagnosis not present

## 2019-06-20 DIAGNOSIS — Z79899 Other long term (current) drug therapy: Secondary | ICD-10-CM | POA: Diagnosis not present

## 2019-06-20 DIAGNOSIS — I10 Essential (primary) hypertension: Secondary | ICD-10-CM | POA: Diagnosis not present

## 2019-06-20 DIAGNOSIS — M549 Dorsalgia, unspecified: Secondary | ICD-10-CM | POA: Diagnosis not present

## 2019-06-20 DIAGNOSIS — D649 Anemia, unspecified: Secondary | ICD-10-CM | POA: Diagnosis not present

## 2019-06-20 DIAGNOSIS — D46C Myelodysplastic syndrome with isolated del(5q) chromosomal abnormality: Secondary | ICD-10-CM | POA: Diagnosis present

## 2019-06-20 DIAGNOSIS — Z8543 Personal history of malignant neoplasm of ovary: Secondary | ICD-10-CM | POA: Insufficient documentation

## 2019-06-20 DIAGNOSIS — C9111 Chronic lymphocytic leukemia of B-cell type in remission: Secondary | ICD-10-CM | POA: Diagnosis present

## 2019-06-20 DIAGNOSIS — F419 Anxiety disorder, unspecified: Secondary | ICD-10-CM | POA: Insufficient documentation

## 2019-06-20 DIAGNOSIS — H538 Other visual disturbances: Secondary | ICD-10-CM | POA: Diagnosis not present

## 2019-06-20 LAB — SAMPLE TO BLOOD BANK

## 2019-06-20 MED ORDER — SODIUM CHLORIDE 0.9% IV SOLUTION
250.0000 mL | Freq: Once | INTRAVENOUS | Status: AC
  Administered 2019-06-20: 250 mL via INTRAVENOUS
  Filled 2019-06-20: qty 250

## 2019-06-20 MED ORDER — SODIUM CHLORIDE 0.9 % IV SOLN
15.0000 mg/kg/h | Freq: Once | INTRAVENOUS | Status: AC
  Administered 2019-06-20: 15 mg/kg/h via INTRAVENOUS
  Filled 2019-06-20: qty 2

## 2019-06-20 MED ORDER — SODIUM CHLORIDE 0.9% FLUSH
10.0000 mL | INTRAVENOUS | Status: AC | PRN
  Administered 2019-06-20: 10 mL
  Filled 2019-06-20: qty 10

## 2019-06-20 MED ORDER — DIPHENHYDRAMINE HCL 50 MG/ML IJ SOLN
25.0000 mg | Freq: Once | INTRAMUSCULAR | Status: AC
  Administered 2019-06-20: 25 mg via INTRAVENOUS
  Filled 2019-06-20: qty 1

## 2019-06-20 MED ORDER — ACETAMINOPHEN 325 MG PO TABS
650.0000 mg | ORAL_TABLET | Freq: Once | ORAL | Status: AC
  Administered 2019-06-20: 650 mg via ORAL
  Filled 2019-06-20: qty 2

## 2019-06-20 MED ORDER — DIAZEPAM 5 MG PO TABS
5.0000 mg | ORAL_TABLET | Freq: Once | ORAL | Status: AC
  Administered 2019-06-20: 5 mg via ORAL
  Filled 2019-06-20: qty 1

## 2019-06-20 MED ORDER — HEPARIN SOD (PORK) LOCK FLUSH 100 UNIT/ML IV SOLN
500.0000 [IU] | Freq: Every day | INTRAVENOUS | Status: AC | PRN
  Administered 2019-06-20: 500 [IU]
  Filled 2019-06-20: qty 5

## 2019-06-20 NOTE — Progress Notes (Signed)
[place]  [phone] [phone]   Name: Jacqueline Russo Date: 06/20/2019 MRN: [id number]  DOB: 03-03-1930  Patient Care Team: [name], MD as PCP - General (Internal Medicine) Lloyd Huger, MD as Consulting Physician (Hematology and Oncology)    REASON FOR CONSULTATION: Jacqueline Russo is a 86 y.o. female with multiple medical problems including history of MDS transfusion dependent.  There has been concern for the possibility of progression to AML but patient has declined Revlimid or other interventions beyond periodic blood transfusions.  Patient has history of CLL and is status post single agent Rituxan in February 2015.  She was referred to palliative care to help address goals and manage any symptomatic complaints.  SOCIAL HISTORY:     reports that she quit smoking about 58 years ago. Her smoking use included cigarettes. She smoked 2.00 packs per day. She has never used smokeless tobacco. She reports previous alcohol use of about 1.0 standard drinks of alcohol per week. She reports that she does not use drugs.   Patient is widowed.  She lives at the [place].  Patient has a son in [place] and another son in [place].  She has a sister who lives nearby.  Patient is originally from [place] and then lived in [place] prior to immigrating to the [place].  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  ADVANCE DIRECTIVES:  On file  CODE STATUS: DNR/DNI (MOST form completed on 05/23/19)  PAST MEDICAL HISTORY: Past Medical History:  Diagnosis Date   Anemia    Myelodysplastic syndrome   Arthritis    Hypertension    Leukemia, chronic lymphoid ([place])    CLL   Ovarian cancer ([place]) 1963    PAST SURGICAL HISTORY:  Past Surgical History:  Procedure Laterality Date   ABDOMINAL HYSTERECTOMY     APPENDECTOMY     BREAST SURGERY     cyst removed from right breast   EYE  SURGERY Bilateral    Cataract Extractions   IR FLUORO GUIDE CV LINE RIGHT  06/30/2017   REVERSE SHOULDER ARTHROPLASTY Right 10/28/2014   Procedure: REVERSE SHOULDER ARTHROPLASTY;  Surgeon: Corky Mull, MD;  Location: [facility] ORS;  Service: Orthopedics;  Laterality: Right;    HEMATOLOGY/ONCOLOGY HISTORY:  Oncology History Overview Note  History of MDS, 5q deletion.  Patient refused treatment with Revlimid.  Underwent a bone marrow biopsy on 06/30/2017 which showed hypercellular marrow 50% with dyspoiesis and increased blasts ~6% consistent with myelodysplastic syndrome.  Given history of MDS with isolated deletion (5q), presence of increased blasts likely indicative of progressive disease.  Now transfusion dependent.  She continued to refuse Revlimid.  Previously had discussed possibility of progressing to AML but patient declined further interventions beyond periodic blood transfusions.   History of iron overload and she receives Desferal prior to transfusions.   Patient has history of CLL and received single agent Rituxan in February 2015.  Previous bone marrow biopsy did not mention any involvement of CLL.    Oncology History     CLL (chronic lymphocytic leukemia) ([place])  11/16/2015 Initial Diagnosis   CLL (chronic lymphocytic leukemia) ([facility])     ALLERGIES:  is allergic to ciprofloxacin.  MEDICATIONS:  Current Outpatient Medications  Medication Sig Dispense Refill   ALPRAZolam (XANAX) 0.25 MG tablet Take 1 tablet (0.25 mg total) by mouth daily as needed for anxiety. 90 tablet 0   aspirin 81 MG  tablet Take 81 mg by mouth daily.     cyanocobalamin (,VITAMIN B-12,) 1000 MCG/ML injection Inject into the muscle.     diltiazem (CARDIZEM CD) 240 MG 24 hr capsule Take 1 capsule by mouth at bedtime.     diphenhydrAMINE (BENADRYL) 50 MG tablet Take 50 mg by mouth at bedtime as needed for itching.     diphenhydrAMINE (SOMINEX) 25 MG tablet Take 25 mg by mouth at bedtime.     etodolac  (LODINE) 400 MG tablet Take 400 mg by mouth daily.     HYDROcodone-acetaminophen (NORCO/VICODIN) 5-325 MG tablet Take 1-2 tablets by mouth every 4 (four) hours as needed for moderate pain or severe pain. 90 tablet 0   lidocaine-prilocaine (EMLA) cream Apply 1 application topically as needed. Apply to port 1-2 hours prior to appointment. Cover with plastic wrap. 30 g 0   magnesium oxide (MAG-OX) 400 (241.3 Mg) MG tablet Take 1 tablet (400 mg total) by mouth daily. 30 tablet 0   mirtazapine (REMERON) 7.5 MG tablet Take 1 tablet (7.5 mg total) by mouth at bedtime. 30 tablet 2   Multiple Vitamins-Minerals (PRESERVISION AREDS 2+MULTI VIT PO) Take 1 tablet by mouth 1 day or 1 dose.     ondansetron (ZOFRAN) 8 MG tablet Take 1 tablet (8 mg total) by mouth every 8 (eight) hours as needed for nausea or vomiting. 20 tablet 0   potassium chloride SA (KLOR-CON) 20 MEQ tablet Take 1 tablet (20 mEq total) by mouth daily. 10 tablet 0   tiZANidine (ZANAFLEX) 2 MG tablet Take 1 tablet (2 mg total) by mouth at bedtime as needed for muscle spasms. 30 tablet 0   No current facility-administered medications for this visit.   Facility-Administered Medications Ordered in Other Visits  Medication Dose Route Frequency Provider Last Rate Last Admin   heparin lock flush 100 unit/mL  500 Units Intravenous Once Lloyd Huger, MD       sodium chloride flush (NS) 0.9 % injection 10 mL  10 mL Intravenous PRN Lloyd Huger, MD   10 mL at 09/05/18 9179    VITAL SIGNS: There were no vitals taken for this visit. There were no vitals filed for this visit.  Estimated body mass index is 19.97 kg/m as calculated from the following:   Height as of 05/30/19: 5' (1.524 m).   Weight as of an earlier encounter on 06/20/19: 102 lb 4 oz (46.4 kg).  LABS: CBC:    Component Value Date/Time   WBC 8.1 06/19/2019 0958   HGB 7.0 (L) 06/19/2019 0958   HGB 11.6 (L) 01/06/2014 0828   HCT 21.5 (L) 06/19/2019 0958   HCT  32.7 (L) 01/06/2014 0828   PLT 378 06/19/2019 0958   PLT 176 01/06/2014 0828   MCV 110.3 (H) 06/19/2019 0958   MCV 103 (H) 01/06/2014 0828   NEUTROABS 5.5 06/19/2019 0958   NEUTROABS 1.6 01/06/2014 0828   LYMPHSABS 1.0 06/19/2019 0958   LYMPHSABS 1.4 01/06/2014 0828   MONOABS 1.0 06/19/2019 0958   MONOABS 0.3 01/06/2014 0828   EOSABS 0.4 06/19/2019 0958   EOSABS 0.1 01/06/2014 0828   BASOSABS 0.2 (H) 06/19/2019 0958   BASOSABS 0.1 01/06/2014 0828   Comprehensive Metabolic Panel:    Component Value Date/Time   NA 138 06/06/2019 0909   NA 140 06/26/2013 1044   K 4.1 06/06/2019 0909   K 3.7 06/26/2013 1044   CL 107 06/06/2019 0909   CL 102 06/26/2013 1044   CO2 22 06/06/2019  0909   CO2 32 06/26/2013 1044   BUN 18 06/06/2019 0909   BUN 16 06/26/2013 1044   CREATININE 0.74 06/06/2019 0909   CREATININE 1.01 01/06/2014 0828   GLUCOSE 140 (H) 06/06/2019 0909   GLUCOSE 121 (H) 06/26/2013 1044   CALCIUM 9.3 06/06/2019 0909   CALCIUM 9.0 06/26/2013 1044   AST 23 06/06/2019 0909   AST 12 (L) 03/27/2013 0955   ALT 27 06/06/2019 0909   ALT 20 03/27/2013 0955   ALKPHOS 78 06/06/2019 0909   ALKPHOS 87 03/27/2013 0955   BILITOT 0.6 06/06/2019 0909   BILITOT 0.5 03/27/2013 0955   PROT 6.5 06/06/2019 0909   PROT 7.3 03/27/2013 0955   ALBUMIN 4.4 06/06/2019 0909   ALBUMIN 4.1 03/27/2013 0955    RADIOGRAPHIC STUDIES: VAS US CAROTID  Result Date: 05/30/2019 Carotid Arterial Duplex Study Indications:       Carotid artery disease and CVD. Comparison Study:  05/29/2018 Performing Technologist: Almira Coaster RVS  Examination Guidelines: A complete evaluation includes B-mode imaging, spectral Doppler, color Doppler, and power Doppler as needed of all accessible portions of each vessel. Bilateral testing is considered an integral part of a complete examination. Limited examinations for reoccurring indications may be performed as noted.  Right Carotid Findings:  +----------+--------+--------+--------+------------------+--------+             PSV cm/s EDV cm/s Stenosis Plaque Description Comments  +----------+--------+--------+--------+------------------+--------+  CCA Prox   120      16                                             +----------+--------+--------+--------+------------------+--------+  CCA Mid    154      24                                             +----------+--------+--------+--------+------------------+--------+  CCA Distal 127      22                                             +----------+--------+--------+--------+------------------+--------+  ICA Prox   230      31                                             +----------+--------+--------+--------+------------------+--------+  ICA Mid    182      22                                             +----------+--------+--------+--------+------------------+--------+  ICA Distal 147      23                                             +----------+--------+--------+--------+------------------+--------+  ECA        397      0                                              +----------+--------+--------+--------+------------------+--------+ +----------+--------+-------+--------+-------------------+  PSV cm/s EDV cms Describe Arm Pressure (mmHG)  +----------+--------+-------+--------+-------------------+  Subclavian 219      0                                     +----------+--------+-------+--------+-------------------+ +---------+--------+---+--------+--+  Vertebral PSV cm/s 151 EDV cm/s 19  +---------+--------+---+--------+--+  Left Carotid Findings: +----------+--------+--------+--------+------------------+--------+             PSV cm/s EDV cm/s Stenosis Plaque Description Comments  +----------+--------+--------+--------+------------------+--------+  CCA Prox   155      22                                             +----------+--------+--------+--------+------------------+--------+  CCA Mid    137      21                                              +----------+--------+--------+--------+------------------+--------+  CCA Distal 136      27                                             +----------+--------+--------+--------+------------------+--------+  ICA Prox   242      29                                             +----------+--------+--------+--------+------------------+--------+  ICA Mid    158      24                                             +----------+--------+--------+--------+------------------+--------+  ICA Distal 166      33                                             +----------+--------+--------+--------+------------------+--------+  ECA        366      19                                             +----------+--------+--------+--------+------------------+--------+ +----------+--------+--------+--------+-------------------+             PSV cm/s EDV cm/s Describe Arm Pressure (mmHG)  +----------+--------+--------+--------+-------------------+  Subclavian 258      0                                      +----------+--------+--------+--------+-------------------+ +---------+--------+---+--------+--+  Vertebral PSV cm/s 109 EDV cm/s 18  +---------+--------+---+--------+--+   Summary: Right Carotid: Velocities in the right ICA are consistent with a 40-59%  stenosis. The ECA appears >50% stenosed. Left Carotid: Velocities in the left ICA are consistent with a 40-59% stenosis.               The ECA appears >50% stenosed. Vertebrals:  Bilateral vertebral arteries demonstrate antegrade flow. Subclavians: Normal flow hemodynamics were seen in bilateral subclavian              arteries. *See table(s) above for measurements and observations.  Electronically signed by Hortencia Pilar MD on 05/30/2019 at 5:03:04 PM.    Final     PERFORMANCE STATUS (ECOG) : 1 - Symptomatic but completely ambulatory  Review of Systems Unless otherwise noted, a complete review of systems is negative.  Physical  Exam General: NAD Pulmonary: Unlabored Abdomen: soft, nontender, + bowel sounds GU: no suprapubic tenderness Extremities: Trace bilateral lower edema, no joint deformities Skin: no rashes Neurological: Weakness but otherwise nonfocal  IMPRESSION: Routine follow-up visit today.  Patient was seen in the infusion area.  Patient reports that she is doing about the same.  She still has chronically poor oral intake.  Weight is down to 102 pounds from previous weight of 105 pounds 3 weeks ago.  Patient is being followed by RD.  I encouraged patient increase oral supplements to 3 times a day.  Patient says symptomatically, she is weak.  She is now using a walker to ambulate outside of her home.  She also endorses a tremor.  We discussed work-up but patient said "no more medications or doctors."  Patient says that she is most worried about loss of independence.  She has progressive visual impairment and sees a Public relations account executive at [employer].  She is hopeful they will be able have some suggestions that allow her to continue reading and engaging in activities she finds enjoyable.   PLAN: -Best supportive care -Home palliative care referral -Increase supplements to 3 times daily -RTC in 1 month  Case and plan discussed in detail with Dr. Grayland Ormond   Patient expressed understanding and was in agreement with this plan. She also understands that She can call the clinic at any time with any questions, concerns, or complaints.     Time Total: 15 minutes  Visit consisted of counseling and education dealing with the complex and emotionally intense issues of symptom management and palliative care in the setting of serious and potentially life-threatening illness.Greater than 50%  of this time was spent counseling and coordinating care related to the above assessment and plan.  Signed by: Altha Harm, PhD, NP-C

## 2019-06-21 LAB — BPAM RBC
Blood Product Expiration Date: 202104082359
ISSUE DATE / TIME: 202104011140
Unit Type and Rh: 6200

## 2019-06-21 LAB — TYPE AND SCREEN
ABO/RH(D): AB POS
Antibody Screen: NEGATIVE
Unit division: 0

## 2019-06-26 ENCOUNTER — Other Ambulatory Visit: Payer: Self-pay | Admitting: Internal Medicine

## 2019-06-26 DIAGNOSIS — M5440 Lumbago with sciatica, unspecified side: Secondary | ICD-10-CM

## 2019-06-27 ENCOUNTER — Telehealth: Payer: Self-pay | Admitting: Adult Health Nurse Practitioner

## 2019-06-27 NOTE — Telephone Encounter (Signed)
Spoke with patient and offered to schedule a Palliative Consult and she has declined Palliative services in the home at this time.  Will notify MD office.

## 2019-07-09 ENCOUNTER — Ambulatory Visit
Admission: RE | Admit: 2019-07-09 | Discharge: 2019-07-09 | Disposition: A | Discharge status: Home / Self Care | Payer: Medicare Other | Source: Ambulatory Visit | Attending: Internal Medicine | Admitting: Internal Medicine

## 2019-07-09 ENCOUNTER — Other Ambulatory Visit: Payer: Self-pay | Admitting: *Deleted

## 2019-07-09 ENCOUNTER — Other Ambulatory Visit: Payer: Self-pay

## 2019-07-09 DIAGNOSIS — M5440 Lumbago with sciatica, unspecified side: Secondary | ICD-10-CM | POA: Diagnosis not present

## 2019-07-09 MED ORDER — ALPRAZOLAM 0.25 MG PO TABS: 0.2500 mg | ORAL_TABLET | Freq: Every day | ORAL | 0 refills | Status: DC | PRN

## 2019-07-13 NOTE — Progress Notes (Signed)
[facility]  [phone] [phone]  ID: Chaney Born OB: [birth year]  MR#: [id number]  XNT#:[id number]  Patient Care Team: [name], MD as PCP - General (Internal Medicine) Lloyd Huger, MD as Consulting Physician (Hematology and Oncology)  CHIEF COMPLAINT: MDS 5q-, CLL  INTERVAL HISTORY: Patient returns to clinic today for further evaluation and continued supportive care with blood transfusion and Desferal.  She continues to have chronic weakness and fatigue.  Her eyesight continues to get worse with macular degeneration.  She has no neurologic complaints. She denies any recent fevers or illnesses. She denies any night sweats or weight loss.  She denies any chest pain, shortness of breath, cough, or hemoptysis.  She denies any nausea, vomiting, constipation, or diarrhea. She denies any melena or hematochezia. She has no urinary complaints.  Patient offers no further specific complaints today.  REVIEW OF SYSTEMS:   Review of Systems  Constitutional: Positive for malaise/fatigue. Negative for fever and weight loss.  Eyes: Negative.  Negative for blurred vision, double vision and pain.  Respiratory: Negative.  Negative for cough and shortness of breath.   Cardiovascular: Negative.  Negative for chest pain and leg swelling.  Gastrointestinal: Negative.  Negative for abdominal pain, blood in stool and melena.  Genitourinary: Negative.  Negative for dysuria.  Musculoskeletal: Positive for back pain. Negative for joint pain.  Skin: Negative.  Negative for rash.  Neurological: Positive for weakness. Negative for dizziness, sensory change, focal weakness and headaches.  Endo/Heme/Allergies: Does not bruise/bleed easily.  Psychiatric/Behavioral: Negative.  The patient is not nervous/anxious.     As per HPI. Otherwise, a complete review of systems is negative.  PAST MEDICAL HISTORY: Past Medical History:  Diagnosis Date  . Anemia    Myelodysplastic syndrome  . Arthritis   . Hypertension   . Leukemia, chronic lymphoid (HCC)    CLL  . Ovarian cancer ([place]) 1963    PAST SURGICAL HISTORY: Past Surgical History:  Procedure Laterality Date  . ABDOMINAL HYSTERECTOMY    . APPENDECTOMY    . BREAST SURGERY     cyst removed from right breast  . EYE SURGERY Bilateral    Cataract Extractions  . IR FLUORO GUIDE CV LINE RIGHT  06/30/2017  . REVERSE SHOULDER ARTHROPLASTY Right 10/28/2014   Procedure: REVERSE SHOULDER ARTHROPLASTY;  Surgeon: Corky Mull, MD;  Location: [facility] ORS;  Service: Orthopedics;  Laterality: Right;    FAMILY HISTORY: Reviewed and unchanged. No reported history of malignancy or chronic disease.     ADVANCED DIRECTIVES:    HEALTH MAINTENANCE: Social History   Tobacco Use  . Smoking status: Former Smoker    Packs/day: 2.00    Types: Cigarettes    Quit date: 1963    Years since quitting: 58.3  . Smokeless tobacco: Never Used  Substance Use Topics  . Alcohol use: Not Currently    Alcohol/week: 1.0 standard drinks    Types: 1 Glasses of wine per week    Comment: 1 glass of wine daily  . Drug use: No     Colonoscopy:  PAP:  Bone density:  Lipid panel:  Allergies  Allergen Reactions  . Ciprofloxacin Nausea Only    Current Outpatient Medications  Medication Sig Dispense Refill  . ALPRAZolam (XANAX) 0.25 MG tablet Take 1 tablet (0.25 mg total) by mouth daily as needed for anxiety. 90 tablet 0  . aspirin 81 MG tablet Take 81 mg by mouth daily.    . cyanocobalamin (,VITAMIN B-12,) 1000  MCG/ML injection Inject into the muscle.    . diltiazem (CARDIZEM CD) 240 MG 24 hr capsule Take 1 capsule by mouth at bedtime.    . diphenhydrAMINE (BENADRYL) 50 MG tablet Take 50 mg by mouth at bedtime as needed for itching.    . diphenhydrAMINE (SOMINEX) 25 MG tablet Take 25 mg by mouth at bedtime.    Marland Kitchen etodolac (LODINE) 400 MG tablet Take 400 mg by mouth daily.    Marland Kitchen HYDROcodone-acetaminophen  (NORCO/VICODIN) 5-325 MG tablet Take 1-2 tablets by mouth every 4 (four) hours as needed for moderate pain or severe pain. 90 tablet 0  . lidocaine-prilocaine (EMLA) cream Apply 1 application topically as needed. Apply to port 1-2 hours prior to appointment. Cover with plastic wrap. 30 g 3  . magnesium oxide (MAG-OX) 400 (241.3 Mg) MG tablet Take 1 tablet (400 mg total) by mouth daily. 30 tablet 0  . mirtazapine (REMERON) 7.5 MG tablet Take 1 tablet (7.5 mg total) by mouth at bedtime. 30 tablet 2  . Multiple Vitamins-Minerals (PRESERVISION AREDS 2+MULTI VIT PO) Take 1 tablet by mouth 1 day or 1 dose.    . ondansetron (ZOFRAN) 8 MG tablet Take 1 tablet (8 mg total) by mouth every 8 (eight) hours as needed for nausea or vomiting. 20 tablet 0  . potassium chloride SA (KLOR-CON) 20 MEQ tablet Take 1 tablet (20 mEq total) by mouth daily. 10 tablet 0  . tiZANidine (ZANAFLEX) 2 MG tablet Take 1 tablet (2 mg total) by mouth at bedtime as needed for muscle spasms. 30 tablet 0   No current facility-administered medications for this visit.   Facility-Administered Medications Ordered in Other Visits  Medication Dose Route Frequency Provider Last Rate Last Admin  . heparin lock flush 100 unit/mL  500 Units Intravenous Once Lloyd Huger, MD      . sodium chloride flush (NS) 0.9 % injection 10 mL  10 mL Intravenous PRN Lloyd Huger, MD   10 mL at 09/05/18 7846    OBJECTIVE: There were no vitals filed for this visit.   There is no height or weight on file to calculate BMI.    ECOG FS:1 - Symptomatic but completely ambulatory  General: Thin, no acute distress. Eyes: Pink conjunctiva, anicteric sclera. HEENT: Normocephalic, moist mucous membranes. Lungs: No audible wheezing or coughing. Heart: Regular rate and rhythm. Abdomen: Soft, nontender, no obvious distention. Musculoskeletal: No edema, cyanosis, or clubbing. Neuro: Alert, answering all questions appropriately. Cranial nerves grossly  intact. Skin: No rashes or petechiae noted. Psych: Normal affect.   LAB RESULTS:  Lab Results  Component Value Date   NA 138 06/06/2019   K 4.1 06/06/2019   CL 107 06/06/2019   CO2 22 06/06/2019   GLUCOSE 140 (H) 06/06/2019   BUN 18 06/06/2019   CREATININE 0.74 06/06/2019   CALCIUM 9.3 06/06/2019   PROT 6.5 06/06/2019   ALBUMIN 4.4 06/06/2019   AST 23 06/06/2019   ALT 27 06/06/2019   ALKPHOS 78 06/06/2019   BILITOT 0.6 06/06/2019   GFRNONAA >60 06/06/2019   GFRAA >60 06/06/2019    Lab Results  Component Value Date   WBC 6.5 07/17/2019   NEUTROABS 5.3 07/17/2019   HGB 8.0 (L) 07/17/2019   HCT 24.1 (L) 07/17/2019   MCV 110.6 (H) 07/17/2019   PLT 381 07/17/2019   Lab Results  Component Value Date   IRON 173 (H) 07/17/2019   TIBC 330 07/17/2019   IRONPCTSAT 52 (H) 07/17/2019   Lab Results  Component Value Date   FERRITIN 825 (H) 07/17/2019     STUDIES: MR LUMBAR SPINE WO CONTRAST  Result Date: 07/09/2019 CLINICAL DATA:  Low back pain and bilateral leg weakness for 6 months. EXAM: MRI LUMBAR SPINE WITHOUT CONTRAST TECHNIQUE: Multiplanar, multisequence MR imaging of the lumbar spine was performed. No intravenous contrast was administered. COMPARISON:  MRI of the abdomen 01/02/2019. FINDINGS: Segmentation:  Standard. Alignment: There is trace anterolisthesis L3 on L4 and 0.5 cm anterolisthesis L4 on L5 due to facet arthropathy. Vertebrae:  No fracture, evidence of discitis, or bone lesion. Conus medullaris and cauda equina: Conus extends to the L1-2 level. Conus and cauda equina appear normal. Paraspinal and other soft tissues: Cysts in the liver and left kidney are identified. Lesion worrisome for renal cell carcinoma in the right kidney seen on prior abdomen MRI is not well visualized on this examination. Disc levels: T10-11 is imaged in the sagittal plane only. There is a broad-based disc bulge which appears to efface the ventral thecal sac but the central canal and  foramina are open. T11-12: Mild facet arthropathy and a disc bulge without stenosis. T12-L1: Shallow disc bulge and mild facet degenerative change. No stenosis. L1-2: Shallow disc bulge and mild facet arthropathy.  No stenosis. L2-3: There is loss of disc space height with a broad-based bulge and mild-to-moderate facet arthropathy. There is mild central canal and right foraminal narrowing. Left foramen open. L3-4: Broad-based disc bulge, ligamentum flavum thickening and facet arthropathy. There is severe central canal and bilateral subarticular recess narrowing. Moderate bilateral foraminal narrowing is worse on the right. L4-5: The disc is uncovered without bulging. There is a small protrusion in the left foramen. Facet arthropathy is noted. There is mild central canal stenosis and moderate left foraminal narrowing. Right foramen open. L5-S1: Negative. IMPRESSION: Spondylosis appears worst at L3-4 where there is severe central canal and bilateral subarticular recess narrowing. Facet degenerative disease results in 0.5 cm anterolisthesis L4 on L5. Mild central canal and moderate left foraminal narrowing are present at L4-5. Lesion worrisome for right renal cell carcinoma seen on prior abdomen MRI is not well demonstrated on this exam. Electronically Signed   By: Inge Rise M.D.   On: 07/09/2019 16:20    ASSESSMENT: MDS 5q-, CLL   PLAN:    1. MDS, 5q-: Bone marrow biopsy results from June 30, 2017 reviewed independently confirming progression of patient's MDS, 5q-. She has a hypercellular marrow for age as well as an increased blast count up to 6%.  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  Patient continues to decline treatment with Revlimid.  Previously, we also discussed the possibility of progressing to AML, but patient wishes no further interventions other than periodic blood transfusions.  Although patient's hemoglobin has improved to 8.0, she remains  symptomatic therefore will proceed with 1 unit of packed red blood cells. She also requires Desferal with every infusion.  Return to clinic in 4 weeks for laboratory work on day 1 and then further evaluation and continuation of treatment on day 2. 2.  Elevated iron stores: Chronic and unchanged.  Desferal as above.   3.  CLL: Bone marrow biopsy did not mention any involvement of CLL.  White blood cell count is within normal limits today.  CT scan results from June 09, 2014 were independently reviewed and did not reveal any evidence of recurrent disease.  Previously, all of her other labwork was either negative or within normal limits. Patient last  received single agent Rituxan in February 2015.  4.  Anxiety: Chronic and unchanged.  Continue Xanax as needed. 5.  Hypertension: Chronic and unchanged.  Unclear her compliance with her medications.  Continue follow-up and treatment per primary care. 6.  Back pain: Chronic and unchanged. 7.  Macular degeneration: Continue follow-up with ophthalmology as needed.    Patient expressed understanding and was in agreement with this plan. She also understands that She can call clinic at any time with any questions, concerns, or complaints.   Lloyd Huger, MD   07/18/2019 6:22 PM

## 2019-07-16 ENCOUNTER — Other Ambulatory Visit: Payer: Self-pay | Admitting: Emergency Medicine

## 2019-07-17 ENCOUNTER — Other Ambulatory Visit: Payer: Self-pay | Admitting: Oncology

## 2019-07-17 ENCOUNTER — Other Ambulatory Visit: Payer: Self-pay

## 2019-07-17 ENCOUNTER — Inpatient Hospital Stay: Payer: Medicare Other

## 2019-07-17 DIAGNOSIS — C9110 Chronic lymphocytic leukemia of B-cell type not having achieved remission: Secondary | ICD-10-CM | POA: Diagnosis not present

## 2019-07-17 DIAGNOSIS — D46C Myelodysplastic syndrome with isolated del(5q) chromosomal abnormality: Secondary | ICD-10-CM

## 2019-07-17 LAB — CBC WITH DIFFERENTIAL/PLATELET
Abs Immature Granulocytes: 0.07 10*3/uL (ref 0.00–0.07)
Basophils Absolute: 0.1 10*3/uL (ref 0.0–0.1)
Basophils Relative: 2 %
Eosinophils Absolute: 0.1 10*3/uL (ref 0.0–0.5)
Eosinophils Relative: 2 %
HCT: 24.1 % — ABNORMAL LOW (ref 36.0–46.0)
Hemoglobin: 8 g/dL — ABNORMAL LOW (ref 12.0–15.0)
Immature Granulocytes: 1 %
Lymphocytes Relative: 8 %
Lymphs Abs: 0.5 10*3/uL — ABNORMAL LOW (ref 0.7–4.0)
MCH: 36.7 pg — ABNORMAL HIGH (ref 26.0–34.0)
MCHC: 33.2 g/dL (ref 30.0–36.0)
MCV: 110.6 fL — ABNORMAL HIGH (ref 80.0–100.0)
Monocytes Absolute: 0.4 10*3/uL (ref 0.1–1.0)
Monocytes Relative: 6 %
Neutro Abs: 5.3 10*3/uL (ref 1.7–7.7)
Neutrophils Relative %: 81 %
Platelets: 381 10*3/uL (ref 150–400)
RBC: 2.18 MIL/uL — ABNORMAL LOW (ref 3.87–5.11)
Smear Review: NORMAL
WBC: 6.5 10*3/uL (ref 4.0–10.5)
nRBC: 0 % (ref 0.0–0.2)

## 2019-07-17 LAB — PREPARE RBC (CROSSMATCH)

## 2019-07-17 LAB — SAMPLE TO BLOOD BANK

## 2019-07-17 LAB — IRON AND TIBC
Iron: 173 ug/dL — ABNORMAL HIGH (ref 28–170)
Saturation Ratios: 52 % — ABNORMAL HIGH (ref 10.4–31.8)
TIBC: 330 ug/dL (ref 250–450)
UIBC: 157 ug/dL

## 2019-07-17 LAB — FERRITIN: Ferritin: 825 ng/mL — ABNORMAL HIGH (ref 11–307)

## 2019-07-18 ENCOUNTER — Inpatient Hospital Stay (HOSPITAL_BASED_OUTPATIENT_CLINIC_OR_DEPARTMENT_OTHER): Payer: Medicare Other | Admitting: Oncology

## 2019-07-18 ENCOUNTER — Inpatient Hospital Stay (HOSPITAL_BASED_OUTPATIENT_CLINIC_OR_DEPARTMENT_OTHER): Payer: Medicare Other | Admitting: Hospice and Palliative Medicine

## 2019-07-18 ENCOUNTER — Inpatient Hospital Stay: Payer: Medicare Other

## 2019-07-18 DIAGNOSIS — I6523 Occlusion and stenosis of bilateral carotid arteries: Secondary | ICD-10-CM

## 2019-07-18 DIAGNOSIS — D46C Myelodysplastic syndrome with isolated del(5q) chromosomal abnormality: Secondary | ICD-10-CM

## 2019-07-18 DIAGNOSIS — D469 Myelodysplastic syndrome, unspecified: Secondary | ICD-10-CM

## 2019-07-18 DIAGNOSIS — F419 Anxiety disorder, unspecified: Secondary | ICD-10-CM | POA: Diagnosis not present

## 2019-07-18 DIAGNOSIS — Z515 Encounter for palliative care: Secondary | ICD-10-CM

## 2019-07-18 DIAGNOSIS — C9110 Chronic lymphocytic leukemia of B-cell type not having achieved remission: Secondary | ICD-10-CM | POA: Diagnosis not present

## 2019-07-18 MED ORDER — SODIUM CHLORIDE 0.9 % IV SOLN
Freq: Once | INTRAVENOUS | Status: AC
  Filled 2019-07-18: qty 250

## 2019-07-18 MED ORDER — HEPARIN SOD (PORK) LOCK FLUSH 100 UNIT/ML IV SOLN
500.0000 [IU] | Freq: Every day | INTRAVENOUS | Status: AC | PRN
  Administered 2019-07-18: 500 [IU]
  Filled 2019-07-18: qty 5

## 2019-07-18 MED ORDER — LIDOCAINE-PRILOCAINE 2.5-2.5 % EX CREA: 1.0000 "application " | TOPICAL_CREAM | CUTANEOUS | 3 refills | Status: AC | PRN

## 2019-07-18 MED ORDER — SODIUM CHLORIDE 0.9 % IV SOLN
15.0000 mg/kg/h | Freq: Once | INTRAVENOUS | Status: AC
  Administered 2019-07-18: 15 mg/kg/h via INTRAVENOUS
  Filled 2019-07-18: qty 2

## 2019-07-18 MED ORDER — ACETAMINOPHEN 325 MG PO TABS
650.0000 mg | ORAL_TABLET | Freq: Once | ORAL | Status: AC
  Administered 2019-07-18: 650 mg via ORAL
  Filled 2019-07-18: qty 2

## 2019-07-18 MED ORDER — DIPHENHYDRAMINE HCL 50 MG/ML IJ SOLN
25.0000 mg | Freq: Once | INTRAMUSCULAR | Status: AC
  Administered 2019-07-18: 25 mg via INTRAVENOUS
  Filled 2019-07-18: qty 1

## 2019-07-18 MED ORDER — HEPARIN SOD (PORK) LOCK FLUSH 100 UNIT/ML IV SOLN
INTRAVENOUS | Status: AC
  Filled 2019-07-18: qty 5

## 2019-07-18 MED ORDER — DIAZEPAM 5 MG PO TABS: 5.0000 mg | ORAL_TABLET | Freq: Once | ORAL | Status: DC

## 2019-07-18 MED ORDER — DIAZEPAM 5 MG PO TABS
5.0000 mg | ORAL_TABLET | Freq: Once | ORAL | Status: AC
  Administered 2019-07-18: 5 mg via ORAL
  Filled 2019-07-18: qty 1

## 2019-07-18 MED ORDER — SODIUM CHLORIDE 0.9% IV SOLUTION
250.0000 mL | Freq: Once | INTRAVENOUS | Status: AC
  Administered 2019-07-18: 250 mL via INTRAVENOUS
  Filled 2019-07-18: qty 250

## 2019-07-18 MED ORDER — SODIUM CHLORIDE 0.9% FLUSH
10.0000 mL | INTRAVENOUS | Status: AC | PRN
  Administered 2019-07-18: 10 mL
  Filled 2019-07-18: qty 10

## 2019-07-18 NOTE — Progress Notes (Signed)
[place]  Telephone:([phone] [phone]   Name: Jacqueline Russo Date: 07/18/2019 MRN: [id number]  DOB: May 19, 1929  Patient Care Team: [name], MD as PCP - General (Internal Medicine) Lloyd Huger, MD as Consulting Physician (Hematology and Oncology)    REASON FOR CONSULTATION: Jacqueline Russo is a 86 y.o. female with multiple medical problems including history of MDS transfusion dependent.  There has been concern for the possibility of progression to AML but patient has declined Revlimid or other interventions beyond periodic blood transfusions.  Patient has history of CLL and is status post single agent Rituxan in February 2015.  She was referred to palliative care to help address goals and manage any symptomatic complaints.  SOCIAL HISTORY:     reports that she quit smoking about 58 years ago. Her smoking use included cigarettes. She smoked 2.00 packs per day. She has never used smokeless tobacco. She reports previous alcohol use of about 1.0 standard drinks of alcohol per week. She reports that she does not use drugs.   Patient is widowed.  She lives at the [facility].  Patient has a son in [place] and another son in [place].  She has a sister who lives nearby.  Patient is originally from [place] and then lived in [place] prior to immigrating to the [place].  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  ADVANCE DIRECTIVES:  On file  CODE STATUS: DNR/DNI (MOST form completed on 05/23/19)  PAST MEDICAL HISTORY: Past Medical History:  Diagnosis Date  . Anemia    Myelodysplastic syndrome  . Arthritis   . Hypertension   . Leukemia, chronic lymphoid (HCC)    CLL  . Ovarian cancer ([place]) 1963    PAST SURGICAL HISTORY:  Past Surgical History:  Procedure Laterality Date  . ABDOMINAL HYSTERECTOMY    . APPENDECTOMY    . BREAST SURGERY     cyst removed from right breast  . EYE  SURGERY Bilateral    Cataract Extractions  . IR FLUORO GUIDE CV LINE RIGHT  06/30/2017  . REVERSE SHOULDER ARTHROPLASTY Right 10/28/2014   Procedure: REVERSE SHOULDER ARTHROPLASTY;  Surgeon: Corky Mull, MD;  Location: [facility] ORS;  Service: Orthopedics;  Laterality: Right;    HEMATOLOGY/ONCOLOGY HISTORY:  Oncology History Overview Note  History of MDS, 5q deletion.  Patient refused treatment with Revlimid.  Underwent a bone marrow biopsy on 06/30/2017 which showed hypercellular marrow 50% with dyspoiesis and increased blasts ~6% consistent with myelodysplastic syndrome.  Given history of MDS with isolated deletion (5q), presence of increased blasts likely indicative of progressive disease.  Now transfusion dependent.  She continued to refuse Revlimid.  Previously had discussed possibility of progressing to AML but patient declined further interventions beyond periodic blood transfusions.   History of iron overload and she receives Desferal prior to transfusions.   Patient has history of CLL and received single agent Rituxan in February 2015.  Previous bone marrow biopsy did not mention any involvement of CLL.    Oncology History     CLL (chronic lymphocytic leukemia) ([facility] [facility])  11/16/2015 Initial Diagnosis   CLL (chronic lymphocytic leukemia) (HCC)     ALLERGIES:  is allergic to ciprofloxacin.  MEDICATIONS:  Current Outpatient Medications  Medication Sig Dispense Refill  . ALPRAZolam (XANAX) 0.25 MG tablet Take 1 tablet (0.25 mg total) by mouth daily as needed for anxiety. 90 tablet 0  . aspirin 81 MG  tablet Take 81 mg by mouth daily.    . cyanocobalamin (,VITAMIN B-12,) 1000 MCG/ML injection Inject into the muscle.    . diltiazem (CARDIZEM CD) 240 MG 24 hr capsule Take 1 capsule by mouth at bedtime.    . diphenhydrAMINE (BENADRYL) 50 MG tablet Take 50 mg by mouth at bedtime as needed for itching.    . diphenhydrAMINE (SOMINEX) 25 MG tablet Take 25 mg by mouth at bedtime.    Marland Kitchen etodolac  (LODINE) 400 MG tablet Take 400 mg by mouth daily.    Marland Kitchen HYDROcodone-acetaminophen (NORCO/VICODIN) 5-325 MG tablet Take 1-2 tablets by mouth every 4 (four) hours as needed for moderate pain or severe pain. 90 tablet 0  . lidocaine-prilocaine (EMLA) cream Apply 1 application topically as needed. Apply to port 1-2 hours prior to appointment. Cover with plastic wrap. 30 g 3  . magnesium oxide (MAG-OX) 400 (241.3 Mg) MG tablet Take 1 tablet (400 mg total) by mouth daily. 30 tablet 0  . mirtazapine (REMERON) 7.5 MG tablet Take 1 tablet (7.5 mg total) by mouth at bedtime. 30 tablet 2  . Multiple Vitamins-Minerals (PRESERVISION AREDS 2+MULTI VIT PO) Take 1 tablet by mouth 1 day or 1 dose.    . ondansetron (ZOFRAN) 8 MG tablet Take 1 tablet (8 mg total) by mouth every 8 (eight) hours as needed for nausea or vomiting. 20 tablet 0  . potassium chloride SA (KLOR-CON) 20 MEQ tablet Take 1 tablet (20 mEq total) by mouth daily. 10 tablet 0  . tiZANidine (ZANAFLEX) 2 MG tablet Take 1 tablet (2 mg total) by mouth at bedtime as needed for muscle spasms. 30 tablet 0   No current facility-administered medications for this visit.   Facility-Administered Medications Ordered in Other Visits  Medication Dose Route Frequency Provider Last Rate Last Admin  . heparin lock flush 100 unit/mL  500 Units Intravenous Once Lloyd Huger, MD      . heparin lock flush 100 unit/mL  500 Units Intracatheter Daily PRN Lloyd Huger, MD      . sodium chloride flush (NS) 0.9 % injection 10 mL  10 mL Intravenous PRN Lloyd Huger, MD   10 mL at 09/05/18 8786    VITAL SIGNS: There were no vitals taken for this visit. There were no vitals filed for this visit.  Estimated body mass index is 19.98 kg/m as calculated from the following:   Height as of 05/30/19: 5' (1.524 m).   Weight as of an earlier encounter on 07/18/19: 102 lb 4.7 oz (46.4 kg).  LABS: CBC:    Component Value Date/Time   WBC 6.5 07/17/2019 1417    HGB 8.0 (L) 07/17/2019 1417   HGB 11.6 (L) 01/06/2014 0828   HCT 24.1 (L) 07/17/2019 1417   HCT 32.7 (L) 01/06/2014 0828   PLT 381 07/17/2019 1417   PLT 176 01/06/2014 0828   MCV 110.6 (H) 07/17/2019 1417   MCV 103 (H) 01/06/2014 0828   NEUTROABS 5.3 07/17/2019 1417   NEUTROABS 1.6 01/06/2014 0828   LYMPHSABS 0.5 (L) 07/17/2019 1417   LYMPHSABS 1.4 01/06/2014 0828   MONOABS 0.4 07/17/2019 1417   MONOABS 0.3 01/06/2014 0828   EOSABS 0.1 07/17/2019 1417   EOSABS 0.1 01/06/2014 0828   BASOSABS 0.1 07/17/2019 1417   BASOSABS 0.1 01/06/2014 0828   Comprehensive Metabolic Panel:    Component Value Date/Time   NA 138 06/06/2019 0909   NA 140 06/26/2013 1044   K 4.1 06/06/2019 0909  K 3.7 06/26/2013 1044   CL 107 06/06/2019 0909   CL 102 06/26/2013 1044   CO2 22 06/06/2019 0909   CO2 32 06/26/2013 1044   BUN 18 06/06/2019 0909   BUN 16 06/26/2013 1044   CREATININE 0.74 06/06/2019 0909   CREATININE 1.01 01/06/2014 0828   GLUCOSE 140 (H) 06/06/2019 0909   GLUCOSE 121 (H) 06/26/2013 1044   CALCIUM 9.3 06/06/2019 0909   CALCIUM 9.0 06/26/2013 1044   AST 23 06/06/2019 0909   AST 12 (L) 03/27/2013 0955   ALT 27 06/06/2019 0909   ALT 20 03/27/2013 0955   ALKPHOS 78 06/06/2019 0909   ALKPHOS 87 03/27/2013 0955   BILITOT 0.6 06/06/2019 0909   BILITOT 0.5 03/27/2013 0955   PROT 6.5 06/06/2019 0909   PROT 7.3 03/27/2013 0955   ALBUMIN 4.4 06/06/2019 0909   ALBUMIN 4.1 03/27/2013 0955    RADIOGRAPHIC STUDIES: MR LUMBAR SPINE WO CONTRAST  Result Date: 07/09/2019 CLINICAL DATA:  Low back pain and bilateral leg weakness for 6 months. EXAM: MRI LUMBAR SPINE WITHOUT CONTRAST TECHNIQUE: Multiplanar, multisequence MR imaging of the lumbar spine was performed. No intravenous contrast was administered. COMPARISON:  MRI of the abdomen 01/02/2019. FINDINGS: Segmentation:  Standard. Alignment: There is trace anterolisthesis L3 on L4 and 0.5 cm anterolisthesis L4 on L5 due to facet  arthropathy. Vertebrae:  No fracture, evidence of discitis, or bone lesion. Conus medullaris and cauda equina: Conus extends to the L1-2 level. Conus and cauda equina appear normal. Paraspinal and other soft tissues: Cysts in the liver and left kidney are identified. Lesion worrisome for renal cell carcinoma in the right kidney seen on prior abdomen MRI is not well visualized on this examination. Disc levels: T10-11 is imaged in the sagittal plane only. There is a broad-based disc bulge which appears to efface the ventral thecal sac but the central canal and foramina are open. T11-12: Mild facet arthropathy and a disc bulge without stenosis. T12-L1: Shallow disc bulge and mild facet degenerative change. No stenosis. L1-2: Shallow disc bulge and mild facet arthropathy.  No stenosis. L2-3: There is loss of disc space height with a broad-based bulge and mild-to-moderate facet arthropathy. There is mild central canal and right foraminal narrowing. Left foramen open. L3-4: Broad-based disc bulge, ligamentum flavum thickening and facet arthropathy. There is severe central canal and bilateral subarticular recess narrowing. Moderate bilateral foraminal narrowing is worse on the right. L4-5: The disc is uncovered without bulging. There is a small protrusion in the left foramen. Facet arthropathy is noted. There is mild central canal stenosis and moderate left foraminal narrowing. Right foramen open. L5-S1: Negative. IMPRESSION: Spondylosis appears worst at L3-4 where there is severe central canal and bilateral subarticular recess narrowing. Facet degenerative disease results in 0.5 cm anterolisthesis L4 on L5. Mild central canal and moderate left foraminal narrowing are present at L4-5. Lesion worrisome for right renal cell carcinoma seen on prior abdomen MRI is not well demonstrated on this exam. Electronically Signed   By: Inge Rise M.D.   On: 07/09/2019 16:20    PERFORMANCE STATUS (ECOG) : 1 - Symptomatic but  completely ambulatory  Review of Systems Unless otherwise noted, a complete review of systems is negative.  Physical Exam General: NAD Pulmonary: Unlabored Abdomen: soft, nontender, + bowel sounds GU: no suprapubic tenderness Extremities: Trace bilateral lower edema, no joint deformities Skin: no rashes Neurological: Weakness but otherwise nonfocal  IMPRESSION: Routine follow-up visit today.  Patient was seen in the infusion area.  Patient reports doing about the same.  She denies any significant changes or concerns.  No symptomatic complaints today.  Continues to endorse chronic fatigue.  She says she is napping a lot during the day.  She is hoping that she will have increased energy after transfusion today.  PLAN: -Best supportive care -RTC in 1 month  Case and plan discussed in detail with Dr. Grayland Ormond   Patient expressed understanding and was in agreement with this plan. She also understands that She can call the clinic at any time with any questions, concerns, or complaints.     Time Total: 15 minutes  Visit consisted of counseling and education dealing with the complex and emotionally intense issues of symptom management and palliative care in the setting of serious and potentially life-threatening illness.Greater than 50%  of this time was spent counseling and coordinating care related to the above assessment and plan.  Signed by: Altha Harm, PhD, NP-C

## 2019-07-19 LAB — TYPE AND SCREEN
ABO/RH(D): AB POS
Antibody Screen: NEGATIVE
Unit division: 0

## 2019-07-19 LAB — BPAM RBC
Blood Product Expiration Date: 202105142359
ISSUE DATE / TIME: 202104291139
Unit Type and Rh: 6200

## 2019-07-25 ENCOUNTER — Other Ambulatory Visit: Payer: Self-pay | Admitting: *Deleted

## 2019-07-25 MED ORDER — HYDROCODONE-ACETAMINOPHEN 5-325 MG PO TABS: 1.0000 | ORAL_TABLET | ORAL | 0 refills | Status: DC | PRN

## 2019-07-25 NOTE — Telephone Encounter (Signed)
Patient called cancer center requesting refill of Norco 5-325 mg tablets; take 1 to 2 tablets by mouth every 4 hours as needed for pain.  As mandated by the [place] STOP Act (Strengthen Opioid Misuse Prevention), the [place] Controlled Substance Reporting System ([place]) was reviewed for this patient. Below is the past 8-months of controlled substance prescriptions as displayed by the registry. I have personally consulted with my supervising physician, Dr. Rogue Bussing, who agrees that continuation of opiate therapy is medically appropriate at this time and agrees to provide continual monitoring, including urine/blood drug screens, as indicated. Refill is appropriate on or after 07/11/2019.  [place] reviewed: Reviewed and appropriate.    Faythe Casa, NP 07/25/2019 2:36 PM [phone]

## 2019-08-10 NOTE — Progress Notes (Deleted)
[place]  [phone] [phone]  ID: Jacqueline Russo OB: Feb 11, 1930  MR#: [id number]  QIO#:[id number]  Patient Care Team: [name], MD as PCP - General (Internal Medicine) Lloyd Huger, MD as Consulting Physician (Hematology and Oncology)  CHIEF COMPLAINT: MDS 5q-, CLL  INTERVAL HISTORY: Patient returns to clinic today for further evaluation and continued supportive care with blood transfusion and Desferal.  She continues to have chronic weakness and fatigue.  Her eyesight continues to get worse with macular degeneration.  She has no neurologic complaints. She denies any recent fevers or illnesses. She denies any night sweats or weight loss.  She denies any chest pain, shortness of breath, cough, or hemoptysis.  She denies any nausea, vomiting, constipation, or diarrhea. She denies any melena or hematochezia. She has no urinary complaints.  Patient offers no further specific complaints today.  REVIEW OF SYSTEMS:   Review of Systems  Constitutional: Positive for malaise/fatigue. Negative for fever and weight loss.  Eyes: Negative.  Negative for blurred vision, double vision and pain.  Respiratory: Negative.  Negative for cough and shortness of breath.   Cardiovascular: Negative.  Negative for chest pain and leg swelling.  Gastrointestinal: Negative.  Negative for abdominal pain, blood in stool and melena.  Genitourinary: Negative.  Negative for dysuria.  Musculoskeletal: Positive for back pain. Negative for joint pain.  Skin: Negative.  Negative for rash.  Neurological: Positive for weakness. Negative for dizziness, sensory change, focal weakness and headaches.  Endo/Heme/Allergies: Does not bruise/bleed easily.  Psychiatric/Behavioral: Negative.  The patient is not nervous/anxious.     As per HPI. Otherwise, a complete review of systems is negative.  PAST MEDICAL HISTORY: Past Medical History:  Diagnosis Date  . Anemia     Myelodysplastic syndrome  . Arthritis   . Hypertension   . Leukemia, chronic lymphoid (HCC)    CLL  . Ovarian cancer ([place]) 1963    PAST SURGICAL HISTORY: Past Surgical History:  Procedure Laterality Date  . ABDOMINAL HYSTERECTOMY    . APPENDECTOMY    . BREAST SURGERY     cyst removed from right breast  . EYE SURGERY Bilateral    Cataract Extractions  . IR FLUORO GUIDE CV LINE RIGHT  06/30/2017  . REVERSE SHOULDER ARTHROPLASTY Right 10/28/2014   Procedure: REVERSE SHOULDER ARTHROPLASTY;  Surgeon: Corky Mull, MD;  Location: [facility] ORS;  Service: Orthopedics;  Laterality: Right;    FAMILY HISTORY: Reviewed and unchanged. No reported history of malignancy or chronic disease.     ADVANCED DIRECTIVES:    HEALTH MAINTENANCE: Social History   Tobacco Use  . Smoking status: Former Smoker    Packs/day: 2.00    Types: Cigarettes    Quit date: 1963    Years since quitting: 58.4  . Smokeless tobacco: Never Used  Substance Use Topics  . Alcohol use: Not Currently    Alcohol/week: 1.0 standard drinks    Types: 1 Glasses of wine per week    Comment: 1 glass of wine daily  . Drug use: No     Colonoscopy:  PAP:  Bone density:  Lipid panel:  Allergies  Allergen Reactions  . Ciprofloxacin Nausea Only    Current Outpatient Medications  Medication Sig Dispense Refill  . ALPRAZolam (XANAX) 0.25 MG tablet Take 1 tablet (0.25 mg total) by mouth daily as needed for anxiety. 90 tablet 0  . aspirin 81 MG tablet Take 81 mg by mouth daily.    . cyanocobalamin (,VITAMIN B-12,)  1000 MCG/ML injection Inject into the muscle.    . diltiazem (CARDIZEM CD) 240 MG 24 hr capsule Take 1 capsule by mouth at bedtime.    . diphenhydrAMINE (BENADRYL) 50 MG tablet Take 50 mg by mouth at bedtime as needed for itching.    . diphenhydrAMINE (SOMINEX) 25 MG tablet Take 25 mg by mouth at bedtime.    Marland Kitchen etodolac (LODINE) 400 MG tablet Take 400 mg by mouth daily.    Marland Kitchen HYDROcodone-acetaminophen  (NORCO/VICODIN) 5-325 MG tablet Take 1-2 tablets by mouth every 4 (four) hours as needed for moderate pain or severe pain. 90 tablet 0  . lidocaine-prilocaine (EMLA) cream Apply 1 application topically as needed. Apply to port 1-2 hours prior to appointment. Cover with plastic wrap. 30 g 3  . magnesium oxide (MAG-OX) 400 (241.3 Mg) MG tablet Take 1 tablet (400 mg total) by mouth daily. 30 tablet 0  . mirtazapine (REMERON) 7.5 MG tablet Take 1 tablet (7.5 mg total) by mouth at bedtime. 30 tablet 2  . Multiple Vitamins-Minerals (PRESERVISION AREDS 2+MULTI VIT PO) Take 1 tablet by mouth 1 day or 1 dose.    . ondansetron (ZOFRAN) 8 MG tablet Take 1 tablet (8 mg total) by mouth every 8 (eight) hours as needed for nausea or vomiting. 20 tablet 0  . potassium chloride SA (KLOR-CON) 20 MEQ tablet Take 1 tablet (20 mEq total) by mouth daily. 10 tablet 0  . tiZANidine (ZANAFLEX) 2 MG tablet Take 1 tablet (2 mg total) by mouth at bedtime as needed for muscle spasms. 30 tablet 0   No current facility-administered medications for this visit.   Facility-Administered Medications Ordered in Other Visits  Medication Dose Route Frequency Provider Last Rate Last Admin  . heparin lock flush 100 unit/mL  500 Units Intravenous Once Lloyd Huger, MD      . sodium chloride flush (NS) 0.9 % injection 10 mL  10 mL Intravenous PRN Lloyd Huger, MD   10 mL at 09/05/18 8185    OBJECTIVE: There were no vitals filed for this visit.   There is no height or weight on file to calculate BMI.    ECOG FS:1 - Symptomatic but completely ambulatory  General: Thin, no acute distress. Eyes: Pink conjunctiva, anicteric sclera. HEENT: Normocephalic, moist mucous membranes. Lungs: No audible wheezing or coughing. Heart: Regular rate and rhythm. Abdomen: Soft, nontender, no obvious distention. Musculoskeletal: No edema, cyanosis, or clubbing. Neuro: Alert, answering all questions appropriately. Cranial nerves grossly  intact. Skin: No rashes or petechiae noted. Psych: Normal affect.   LAB RESULTS:  Lab Results  Component Value Date   NA 138 06/06/2019   K 4.1 06/06/2019   CL 107 06/06/2019   CO2 22 06/06/2019   GLUCOSE 140 (H) 06/06/2019   BUN 18 06/06/2019   CREATININE 0.74 06/06/2019   CALCIUM 9.3 06/06/2019   PROT 6.5 06/06/2019   ALBUMIN 4.4 06/06/2019   AST 23 06/06/2019   ALT 27 06/06/2019   ALKPHOS 78 06/06/2019   BILITOT 0.6 06/06/2019   GFRNONAA >60 06/06/2019   GFRAA >60 06/06/2019    Lab Results  Component Value Date   WBC 6.5 07/17/2019   NEUTROABS 5.3 07/17/2019   HGB 8.0 (L) 07/17/2019   HCT 24.1 (L) 07/17/2019   MCV 110.6 (H) 07/17/2019   PLT 381 07/17/2019   Lab Results  Component Value Date   IRON 173 (H) 07/17/2019   TIBC 330 07/17/2019   IRONPCTSAT 52 (H) 07/17/2019   Lab  Results  Component Value Date   FERRITIN 825 (H) 07/17/2019     STUDIES: No results found.  ASSESSMENT: MDS 5q-, CLL   PLAN:    1. MDS, 5q-: Bone marrow biopsy results from June 30, 2017 reviewed independently confirming progression of patient's MDS, 5q-. She has a hypercellular marrow for age as well as an increased blast count up to 6%.  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  Patient continues to decline treatment with Revlimid.  Previously, we also discussed the possibility of progressing to AML, but patient wishes no further interventions other than periodic blood transfusions.  Although patient's hemoglobin has improved to 8.0, she remains symptomatic therefore will proceed with 1 unit of packed red blood cells. She also requires Desferal with every infusion.  Return to clinic in 4 weeks for laboratory work on day 1 and then further evaluation and continuation of treatment on day 2. 2.  Elevated iron stores: Chronic and unchanged.  Desferal as above.   3.  CLL: Bone marrow biopsy did not mention any involvement of CLL.  White blood cell  count is within normal limits today.  CT scan results from June 09, 2014 were independently reviewed and did not reveal any evidence of recurrent disease.  Previously, all of her other labwork was either negative or within normal limits. Patient last received single agent Rituxan in February 2015.  4.  Anxiety: Chronic and unchanged.  Continue Xanax as needed. 5.  Hypertension: Chronic and unchanged.  Unclear her compliance with her medications.  Continue follow-up and treatment per primary care. 6.  Back pain: Chronic and unchanged. 7.  Macular degeneration: Continue follow-up with ophthalmology as needed.    Patient expressed understanding and was in agreement with this plan. She also understands that She can call clinic at any time with any questions, concerns, or complaints.   Lloyd Huger, MD   08/10/2019 7:13 AM

## 2019-08-13 ENCOUNTER — Other Ambulatory Visit: Payer: Self-pay | Admitting: Emergency Medicine

## 2019-08-13 DIAGNOSIS — D46C Myelodysplastic syndrome with isolated del(5q) chromosomal abnormality: Secondary | ICD-10-CM

## 2019-08-14 ENCOUNTER — Inpatient Hospital Stay: Payer: Medicare Other | Attending: Oncology

## 2019-08-14 ENCOUNTER — Other Ambulatory Visit: Payer: Self-pay

## 2019-08-14 DIAGNOSIS — D46C Myelodysplastic syndrome with isolated del(5q) chromosomal abnormality: Secondary | ICD-10-CM | POA: Diagnosis not present

## 2019-08-14 LAB — CBC WITH DIFFERENTIAL/PLATELET
Abs Immature Granulocytes: 0 10*3/uL (ref 0.00–0.07)
Basophils Absolute: 0.2 10*3/uL — ABNORMAL HIGH (ref 0.0–0.1)
Basophils Relative: 5 %
Eosinophils Absolute: 0.1 10*3/uL (ref 0.0–0.5)
Eosinophils Relative: 3 %
HCT: 24.2 % — ABNORMAL LOW (ref 36.0–46.0)
Hemoglobin: 8.3 g/dL — ABNORMAL LOW (ref 12.0–15.0)
Lymphocytes Relative: 22 %
Lymphs Abs: 1 10*3/uL (ref 0.7–4.0)
MCH: 36.6 pg — ABNORMAL HIGH (ref 26.0–34.0)
MCHC: 34.3 g/dL (ref 30.0–36.0)
MCV: 106.6 fL — ABNORMAL HIGH (ref 80.0–100.0)
Metamyelocytes Relative: 1 %
Monocytes Absolute: 0.3 10*3/uL (ref 0.1–1.0)
Monocytes Relative: 7 %
Neutro Abs: 2.7 10*3/uL (ref 1.7–7.7)
Neutrophils Relative %: 62 %
Platelets: 285 10*3/uL (ref 150–400)
RBC: 2.27 MIL/uL — ABNORMAL LOW (ref 3.87–5.11)
RDW: 22.5 % — ABNORMAL HIGH (ref 11.5–15.5)
Smear Review: ADEQUATE
WBC: 4.4 10*3/uL (ref 4.0–10.5)
nRBC: 0 % (ref 0.0–0.2)

## 2019-08-14 LAB — SAMPLE TO BLOOD BANK

## 2019-08-15 ENCOUNTER — Inpatient Hospital Stay: Payer: Medicare Other | Admitting: Oncology

## 2019-08-15 ENCOUNTER — Inpatient Hospital Stay: Payer: Medicare Other | Admitting: Hospice and Palliative Medicine

## 2019-08-15 ENCOUNTER — Inpatient Hospital Stay: Payer: Medicare Other

## 2019-08-15 LAB — FERRITIN: Ferritin: 793 ng/mL — ABNORMAL HIGH (ref 11–307)

## 2019-08-15 LAB — IRON AND TIBC
Iron: 172 ug/dL — ABNORMAL HIGH (ref 28–170)
Saturation Ratios: 59 % — ABNORMAL HIGH (ref 10.4–31.8)
TIBC: 290 ug/dL (ref 250–450)
UIBC: 118 ug/dL

## 2019-08-22 ENCOUNTER — Emergency Department
Admission: EM | Admit: 2019-08-22 | Discharge: 2019-08-22 | Disposition: A | Discharge status: Home / Self Care | Payer: Medicare Other | Attending: Emergency Medicine | Admitting: Emergency Medicine

## 2019-08-22 ENCOUNTER — Other Ambulatory Visit: Payer: Self-pay

## 2019-08-22 DIAGNOSIS — Z87891 Personal history of nicotine dependence: Secondary | ICD-10-CM | POA: Insufficient documentation

## 2019-08-22 DIAGNOSIS — I129 Hypertensive chronic kidney disease with stage 1 through stage 4 chronic kidney disease, or unspecified chronic kidney disease: Secondary | ICD-10-CM | POA: Diagnosis not present

## 2019-08-22 DIAGNOSIS — E1122 Type 2 diabetes mellitus with diabetic chronic kidney disease: Secondary | ICD-10-CM | POA: Insufficient documentation

## 2019-08-22 DIAGNOSIS — Z856 Personal history of leukemia: Secondary | ICD-10-CM | POA: Diagnosis not present

## 2019-08-22 DIAGNOSIS — R197 Diarrhea, unspecified: Secondary | ICD-10-CM | POA: Diagnosis not present

## 2019-08-22 DIAGNOSIS — R109 Unspecified abdominal pain: Secondary | ICD-10-CM | POA: Insufficient documentation

## 2019-08-22 DIAGNOSIS — Z79899 Other long term (current) drug therapy: Secondary | ICD-10-CM | POA: Diagnosis not present

## 2019-08-22 DIAGNOSIS — Z7982 Long term (current) use of aspirin: Secondary | ICD-10-CM | POA: Diagnosis not present

## 2019-08-22 DIAGNOSIS — N183 Chronic kidney disease, stage 3 unspecified: Secondary | ICD-10-CM | POA: Diagnosis not present

## 2019-08-22 LAB — COMPREHENSIVE METABOLIC PANEL
ALT: 15 U/L (ref 0–44)
AST: 13 U/L — ABNORMAL LOW (ref 15–41)
Albumin: 3.6 g/dL (ref 3.5–5.0)
Alkaline Phosphatase: 64 U/L (ref 38–126)
Anion gap: 10 (ref 5–15)
BUN: 23 mg/dL (ref 8–23)
CO2: 25 mmol/L (ref 22–32)
Calcium: 9.4 mg/dL (ref 8.9–10.3)
Chloride: 105 mmol/L (ref 98–111)
Creatinine, Ser: 0.94 mg/dL (ref 0.44–1.00)
GFR calc Af Amer: >60 mL/min (ref >60)
GFR calc non Af Amer: 55 mL/min — ABNORMAL LOW (ref >60)
Glucose, Bld: 136 mg/dL — ABNORMAL HIGH (ref 70–99)
Potassium: 3.5 mmol/L (ref 3.5–5.1)
Sodium: 140 mmol/L (ref 135–145)
Total Bilirubin: 0.6 mg/dL (ref 0.3–1.2)
Total Protein: 6.4 g/dL — ABNORMAL LOW (ref 6.5–8.1)

## 2019-08-22 LAB — CBC
HCT: 21.8 % — ABNORMAL LOW (ref 36.0–46.0)
Hemoglobin: 7 g/dL — ABNORMAL LOW (ref 12.0–15.0)
MCH: 35.9 pg — ABNORMAL HIGH (ref 26.0–34.0)
MCHC: 32.1 g/dL (ref 30.0–36.0)
MCV: 111.8 fL — ABNORMAL HIGH (ref 80.0–100.0)
Platelets: 256 10*3/uL (ref 150–400)
RBC: 1.95 MIL/uL — ABNORMAL LOW (ref 3.87–5.11)
WBC: 10.3 10*3/uL (ref 4.0–10.5)
nRBC: 0 % (ref 0.0–0.2)

## 2019-08-22 LAB — URINALYSIS, COMPLETE (UACMP) WITH MICROSCOPIC
Bilirubin Urine: NEGATIVE
Glucose, UA: NEGATIVE mg/dL
Hgb urine dipstick: NEGATIVE
Ketones, ur: NEGATIVE mg/dL
Nitrite: NEGATIVE
Protein, ur: NEGATIVE mg/dL
Specific Gravity, Urine: 1.008 (ref 1.005–1.030)
Squamous Epithelial / HPF: NONE SEEN (ref 0–5)
pH: 5 (ref 5.0–8.0)

## 2019-08-22 LAB — LIPASE, BLOOD: Lipase: 31 U/L (ref 11–51)

## 2019-08-22 MED ORDER — SODIUM CHLORIDE 0.9 % IV SOLN: Freq: Once | INTRAVENOUS | Status: AC

## 2019-08-22 MED ORDER — HEPARIN SOD (PORK) LOCK FLUSH 100 UNIT/ML IV SOLN
500.0000 [IU] | Freq: Once | INTRAVENOUS | Status: AC
  Administered 2019-08-22: 500 [IU] via INTRAVENOUS
  Filled 2019-08-22: qty 5

## 2019-08-22 MED ORDER — SODIUM CHLORIDE 0.9% FLUSH: 3.0000 mL | Freq: Once | INTRAVENOUS | Status: DC

## 2019-08-22 NOTE — ED Provider Notes (Signed)
[facility] Emergency Department Provider Note   ____________________________________________    I have reviewed the triage vital signs and the nursing notes.   HISTORY  Chief Complaint Diarrhea and Abdominal Pain     HPI Jacqueline Russo is a 86 y.o. female who presents with complaints of diarrhea and abdominal cramping.  Patient reports over the last 3 to 4 days she has had crampy abdominal pain with copious diarrhea.  She has not had a bowel movement since 10 PM last night and reports she thinks her diarrhea is improving however she feels quite weak and fatigued and suspects that she is dehydrated.  She has not had much of an appetite and whenever she would eat she would have diarrhea so she has been restricting p.o. intake.  Denies fevers or chills.  No sick contact.  No recent travel.  Has not take anything for this.  PCP recommended that she come in for IV fluids  Past Medical History:  Diagnosis Date  . Anemia    Myelodysplastic syndrome  . Arthritis   . Hypertension   . Leukemia, chronic lymphoid (HCC)    CLL  . Ovarian cancer [facility]) 1963    Patient Active Problem List   Diagnosis Date Noted  . Anxiety 05/17/2019  . Hypokalemia due to inadequate potassium intake 05/17/2019  . Symptomatic anemia 05/17/2019  . Iron overload 05/17/2019  . MDS (myelodysplastic syndrome) with 5q deletion ([place]) 05/16/2019  . Port-A-Cath in place 02/18/2019  . Exertional dyspnea 10/04/2018  . Gouty arthropathy, chronic, without tophi 07/04/2018  . Right renal mass 02/28/2018  . DM type 2 with diabetic mixed hyperlipidemia ([place]) 08/25/2017  . Idiopathic peripheral neuropathy 02/22/2017  . CLL (chronic lymphocytic leukemia) ([place]) 11/16/2015  . Myelodysplastic syndrome ([place]) 11/16/2015  . Vitamin B12 deficient megaloblastic anemia 10/22/2015  . Carotid stenosis, bilateral 07/21/2015  . Diet-controlled type 2 diabetes mellitus ([place]) 04/22/2015  . CKD (chronic  kidney disease) stage 3, GFR 30-59 ml/min 04/22/2015  . Benign essential hypertension 01/14/2014  . Hyperlipemia 01/14/2014  . Primary osteoarthritis of both hands 01/14/2014    Past Surgical History:  Procedure Laterality Date  . ABDOMINAL HYSTERECTOMY    . APPENDECTOMY    . BREAST SURGERY     cyst removed from right breast  . EYE SURGERY Bilateral    Cataract Extractions  . IR FLUORO GUIDE CV LINE RIGHT  06/30/2017  . REVERSE SHOULDER ARTHROPLASTY Right 10/28/2014   Procedure: REVERSE SHOULDER ARTHROPLASTY;  Surgeon: Corky Mull, MD;  Location: [facility] ORS;  Service: Orthopedics;  Laterality: Right;    Prior to Admission medications   Medication Sig Start Date End Date Taking? Authorizing Provider  ALPRAZolam (XANAX) 0.25 MG tablet Take 1 tablet (0.25 mg total) by mouth daily as needed for anxiety. 07/09/19   Lloyd Huger, MD  aspirin 81 MG tablet Take 81 mg by mouth daily.    [provider]  cyanocobalamin (,VITAMIN B-12,) 1000 MCG/ML injection Inject into the muscle. 03/09/16   [provider]  diltiazem (CARDIZEM CD) 240 MG 24 hr capsule Take 1 capsule by mouth at bedtime. 10/04/18 10/04/19  [provider]  diphenhydrAMINE (BENADRYL) 50 MG tablet Take 50 mg by mouth at bedtime as needed for itching.    [provider]  diphenhydrAMINE (SOMINEX) 25 MG tablet Take 25 mg by mouth at bedtime.    [provider]  etodolac (LODINE) 400 MG tablet Take 400 mg by mouth daily. 06/04/19 06/03/20  [provider]  HYDROcodone-acetaminophen (NORCO/VICODIN) 5-325 MG tablet Take 1-2 tablets by mouth every 4 (four) hours as needed for moderate pain or severe pain. 07/25/19   Jacquelin Hawking, NP  lidocaine-prilocaine (EMLA) cream Apply 1 application topically as needed. Apply to port 1-2 hours prior to appointment. Cover with plastic wrap. 07/18/19   Lloyd Huger, MD  magnesium oxide (MAG-OX) 400 (241.3 Mg) MG tablet Take 1 tablet (400 mg  total) by mouth daily. 05/23/19   Borders, Kirt Boys, NP  mirtazapine (REMERON) 7.5 MG tablet Take 1 tablet (7.5 mg total) by mouth at bedtime. 05/23/19   Borders, Kirt Boys, NP  Multiple Vitamins-Minerals (PRESERVISION AREDS 2+MULTI VIT PO) Take 1 tablet by mouth 1 day or 1 dose.    [provider]  ondansetron (ZOFRAN) 8 MG tablet Take 1 tablet (8 mg total) by mouth every 8 (eight) hours as needed for nausea or vomiting. 05/23/19   Borders, Kirt Boys, NP  potassium chloride SA (KLOR-CON) 20 MEQ tablet Take 1 tablet (20 mEq total) by mouth daily. 05/23/19   Borders, Kirt Boys, NP  tiZANidine (ZANAFLEX) 2 MG tablet Take 1 tablet (2 mg total) by mouth at bedtime as needed for muscle spasms. 12/29/16   Jacquelin Hawking, NP     Allergies Ciprofloxacin  No family history on file.  Social History Social History   Tobacco Use  . Smoking status: Former Smoker    Packs/day: 2.00    Types: Cigarettes    Quit date: 1963    Years since quitting: 58.4  . Smokeless tobacco: Never Used  Substance Use Topics  . Alcohol use: Not Currently    Alcohol/week: 1.0 standard drinks    Types: 1 Glasses of wine per week    Comment: 1 glass of wine daily  . Drug use: No    Review of Systems  Constitutional: No fever/chills Eyes: No visual changes.  ENT: No sore throat. Cardiovascular: Denies chest pain. Respiratory: Denies shortness of breath. Gastrointestinal: As above Genitourinary: Negative for dysuria. Musculoskeletal: Negative for back pain. Skin: Negative for rash. Neurological: Negative for headaches or weakness   ____________________________________________   PHYSICAL EXAM:  VITAL SIGNS: ED Triage Vitals [08/22/19 0857]  Enc Vitals Group     BP (!) 162/50     Pulse Rate 77     Resp 16     Temp 99.2 F (37.3 C)     Temp Source Oral     SpO2 97 %     Weight 47.6 kg (105 lb)     Height 1.524 m (5')     Head Circumference      Peak Flow      Pain Score 3     Pain Loc       Pain Edu?      Excl. in [place]?     Constitutional: Alert and oriented.   Nose: No congestion/rhinnorhea. Mouth/Throat: Mucous membranes are moist.    Cardiovascular: Normal rate, regular rhythm. Grossly normal heart sounds.  Good peripheral circulation. Respiratory: Normal respiratory effort.  No retractions. Lungs CTAB. Gastrointestinal: Soft and nontender. No distention.  Reassuring exam  Musculoskeletal:  Warm and well perfused Neurologic:  Normal speech and language. No gross focal neurologic deficits are appreciated.  Skin:  Skin is warm, dry and intact. No rash noted. Psychiatric: Mood and affect are normal. Speech and behavior are normal.  ____________________________________________   LABS (all labs ordered are listed, but only abnormal results are displayed)  Labs  Reviewed  COMPREHENSIVE METABOLIC PANEL - Abnormal; Notable for the following components:      Result Value   Glucose, Bld 136 (*)    Total Protein 6.4 (*)    AST 13 (*)    GFR calc non Af Amer 55 (*)    All other components within normal limits  CBC - Abnormal; Notable for the following components:   RBC 1.95 (*)    Hemoglobin 7.0 (*)    HCT 21.8 (*)    MCV 111.8 (*)    MCH 35.9 (*)    All other components within normal limits  URINALYSIS, COMPLETE (UACMP) WITH MICROSCOPIC - Abnormal; Notable for the following components:   Color, Urine YELLOW (*)    APPearance HAZY (*)    Leukocytes,Ua TRACE (*)    Bacteria, UA MANY (*)    All other components within normal limits  LIPASE, BLOOD   ____________________________________________  EKG  None ____________________________________________  RADIOLOGY   ____________________________________________   PROCEDURES  Procedure(s) performed: No  Procedures   Critical Care performed: No ____________________________________________   INITIAL IMPRESSION / ASSESSMENT AND PLAN / ED COURSE  Pertinent labs & imaging results that were available during  my care of the patient were reviewed by me and considered in my medical decision making (see chart for details).  Patient overall well-appearing in no acute distress, presents with complaints of fatigue and weakness, does have a history of CLL.  No fevers.  Abdominal exam quite reassuring.  Do suspect mild dehydration, will give IV fluids, will use patient's port at her request.  Lab work thus far is overall reassuring.  She does have a history of significant anemia and hemoglobin is in line with prior levels.  After IV fluids patient feeling significantly improved, given reassuring work-up, no abdominal pain appropriate for discharge at this time.    ____________________________________________   FINAL CLINICAL IMPRESSION(S) / ED DIAGNOSES  Final diagnoses:  Diarrhea, unspecified type        Note:  This document was prepared using Dragon voice recognition software and may include unintentional dictation errors.   Lavonia Drafts, MD 08/22/19 1328

## 2019-08-22 NOTE — ED Triage Notes (Signed)
Pt c/o generalized abd pain with watery diarrhea for the past 4 days. Pt is in NAD at present.

## 2019-08-22 NOTE — ED Notes (Signed)
Pt reports for the past 4 days has had abd pain and diarrhea that was only water. Pt reports no diarrhea today but her abd is still painful.

## 2019-08-23 ENCOUNTER — Other Ambulatory Visit: Payer: Self-pay | Admitting: *Deleted

## 2019-08-23 MED ORDER — HYDROCODONE-ACETAMINOPHEN 5-325 MG PO TABS: 1.0000 | ORAL_TABLET | ORAL | 0 refills | Status: DC | PRN

## 2019-09-08 NOTE — Progress Notes (Signed)
[place]  [phone]) [phone] Fax:(336) [phone]  ID: Jacqueline Russo OB: December 10, 1929  MR#: [id number]  PZW#:[id number]  Patient Care Team: [name], MD as PCP - General (Internal Medicine) Lloyd Huger, MD as Consulting Physician (Hematology and Oncology)  CHIEF COMPLAINT: MDS 5q-, CLL  INTERVAL HISTORY: Patient returns to clinic today for further evaluation and continued supportive care with blood transfusion and Desferal.  She continues have chronic weakness and fatigue.  She otherwise feels well.  She has no neurologic complaints. She denies any recent fevers or illnesses. She denies any night sweats or weight loss.  She denies any chest pain, shortness of breath, cough, or hemoptysis.  She denies any nausea, vomiting, constipation, or diarrhea. She denies any melena or hematochezia. She has no urinary complaints.  Patient offers no further specific complaints today.  REVIEW OF SYSTEMS:   Review of Systems  Constitutional: Positive for malaise/fatigue. Negative for fever and weight loss.  Eyes: Negative.  Negative for blurred vision, double vision and pain.  Respiratory: Negative.  Negative for cough and shortness of breath.   Cardiovascular: Negative.  Negative for chest pain and leg swelling.  Gastrointestinal: Negative.  Negative for abdominal pain, blood in stool and melena.  Genitourinary: Negative.  Negative for dysuria.  Musculoskeletal: Positive for back pain. Negative for joint pain.  Skin: Negative.  Negative for rash.  Neurological: Positive for weakness. Negative for dizziness, sensory change, focal weakness and headaches.  Endo/Heme/Allergies: Does not bruise/bleed easily.  Psychiatric/Behavioral: Negative.  The patient is not nervous/anxious.     As per HPI. Otherwise, a complete review of systems is negative.  PAST MEDICAL HISTORY: Past Medical History:  Diagnosis Date  . Anemia    Myelodysplastic syndrome  . Arthritis   .  Hypertension   . Leukemia, chronic lymphoid (HCC)    CLL  . Ovarian cancer ([place]) 1963    PAST SURGICAL HISTORY: Past Surgical History:  Procedure Laterality Date  . ABDOMINAL HYSTERECTOMY    . APPENDECTOMY    . BREAST SURGERY     cyst removed from right breast  . EYE SURGERY Bilateral    Cataract Extractions  . IR FLUORO GUIDE CV LINE RIGHT  06/30/2017  . REVERSE SHOULDER ARTHROPLASTY Right 10/28/2014   Procedure: REVERSE SHOULDER ARTHROPLASTY;  Surgeon: Corky Mull, MD;  Location: [facility] ORS;  Service: Orthopedics;  Laterality: Right;    FAMILY HISTORY: Reviewed and unchanged. No reported history of malignancy or chronic disease.     ADVANCED DIRECTIVES:    HEALTH MAINTENANCE: Social History   Tobacco Use  . Smoking status: Former Smoker    Packs/day: 2.00    Types: Cigarettes    Quit date: 1963    Years since quitting: 58.5  . Smokeless tobacco: Never Used  Vaping Use  . Vaping Use: Never used  Substance Use Topics  . Alcohol use: Not Currently    Alcohol/week: 1.0 standard drink    Types: 1 Glasses of wine per week    Comment: 1 glass of wine daily  . Drug use: No     Colonoscopy:  PAP:  Bone density:  Lipid panel:  Allergies  Allergen Reactions  . Ciprofloxacin Nausea Only    Current Outpatient Medications  Medication Sig Dispense Refill  . ALPRAZolam (XANAX) 0.25 MG tablet Take 1 tablet (0.25 mg total) by mouth daily as needed for anxiety. 90 tablet 0  . aspirin 81 MG tablet Take 81 mg by mouth daily.    Marland Kitchen  cyanocobalamin (,VITAMIN B-12,) 1000 MCG/ML injection Inject into the muscle.    . diltiazem (CARDIZEM CD) 240 MG 24 hr capsule Take 1 capsule by mouth at bedtime.    . diphenhydrAMINE (BENADRYL) 50 MG tablet Take 50 mg by mouth at bedtime as needed for itching.    . diphenhydrAMINE (SOMINEX) 25 MG tablet Take 25 mg by mouth at bedtime.    Marland Kitchen etodolac (LODINE) 400 MG tablet Take 400 mg by mouth daily.    Marland Kitchen HYDROcodone-acetaminophen (NORCO/VICODIN)  5-325 MG tablet Take 1-2 tablets by mouth every 4 (four) hours as needed for moderate pain or severe pain. 90 tablet 0  . lidocaine-prilocaine (EMLA) cream Apply 1 application topically as needed. Apply to port 1-2 hours prior to appointment. Cover with plastic wrap. 30 g 3  . magnesium oxide (MAG-OX) 400 (241.3 Mg) MG tablet Take 1 tablet (400 mg total) by mouth daily. 30 tablet 0  . mirtazapine (REMERON) 7.5 MG tablet Take 1 tablet (7.5 mg total) by mouth at bedtime. 30 tablet 2  . Multiple Vitamins-Minerals (PRESERVISION AREDS 2+MULTI VIT PO) Take 1 tablet by mouth 1 day or 1 dose.    . ondansetron (ZOFRAN) 8 MG tablet Take 1 tablet (8 mg total) by mouth every 8 (eight) hours as needed for nausea or vomiting. 20 tablet 0  . potassium chloride SA (KLOR-CON) 20 MEQ tablet Take 1 tablet (20 mEq total) by mouth daily. 10 tablet 0  . tiZANidine (ZANAFLEX) 2 MG tablet Take 1 tablet (2 mg total) by mouth at bedtime as needed for muscle spasms. 30 tablet 0   Current Facility-Administered Medications  Medication Dose Route Frequency Provider Last Rate Last Admin  . heparin lock flush 100 unit/mL  500 Units Intravenous Once Lloyd Huger, MD       Facility-Administered Medications Ordered in Other Visits  Medication Dose Route Frequency Provider Last Rate Last Admin  . heparin lock flush 100 unit/mL  500 Units Intravenous Once Lloyd Huger, MD      . sodium chloride flush (NS) 0.9 % injection 10 mL  10 mL Intravenous PRN Lloyd Huger, MD   10 mL at 09/05/18 0822    OBJECTIVE: Vitals:   09/12/19 1226  BP: (!) 170/77  Pulse: 82  Resp: 18  Temp: 98.2 F (36.8 C)     There is no height or weight on file to calculate BMI.    ECOG FS:1 - Symptomatic but completely ambulatory  General: Thin, no acute distress. Eyes: Pink conjunctiva, anicteric sclera. HEENT: Normocephalic, moist mucous membranes. Lungs: No audible wheezing or coughing. Heart: Regular rate and rhythm. Abdomen:  Soft, nontender, no obvious distention. Musculoskeletal: No edema, cyanosis, or clubbing. Neuro: Alert, answering all questions appropriately. Cranial nerves grossly intact. Skin: No rashes or petechiae noted. Psych: Normal affect.   LAB RESULTS:  Lab Results  Component Value Date   NA 140 08/22/2019   K 3.5 08/22/2019   CL 105 08/22/2019   CO2 25 08/22/2019   GLUCOSE 136 (H) 08/22/2019   BUN 23 08/22/2019   CREATININE 0.94 08/22/2019   CALCIUM 9.4 08/22/2019   PROT 6.4 (L) 08/22/2019   ALBUMIN 3.6 08/22/2019   AST 13 (L) 08/22/2019   ALT 15 08/22/2019   ALKPHOS 64 08/22/2019   BILITOT 0.6 08/22/2019   GFRNONAA 55 (L) 08/22/2019   GFRAA >60 08/22/2019    Lab Results  Component Value Date   WBC 8.0 09/11/2019   NEUTROABS 4.2 09/11/2019   HGB 6.5 (  L) 09/11/2019   HCT 19.7 (L) 09/11/2019   MCV 112.6 (H) 09/11/2019   PLT 371 09/11/2019   Lab Results  Component Value Date   IRON 53 09/11/2019   TIBC 252 09/11/2019   IRONPCTSAT 21 09/11/2019   Lab Results  Component Value Date   FERRITIN 676 (H) 09/11/2019     STUDIES: No results found.  ASSESSMENT: MDS 5q-, CLL   PLAN:    1. MDS, 5q-: Bone marrow biopsy results from June 30, 2017 reviewed independently confirming progression of patient's MDS, 5q-. She has a hypercellular marrow for age as well as an increased blast count up to 6%.  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  Patient continues to decline treatment with Revlimid.  Previously, we also discussed the possibility of progressing to AML, but patient wishes no further interventions other than periodic blood transfusions.  Patient's hemoglobin has trended down to 6.5 and she is symptomatic.  Proceed with 2 units of packed red blood cells today.  She also requires Desferal with every infusion.  Return to clinic in 4 weeks for laboratory work on day 1 and then further evaluation and blood/Desferal on day 2. 2.   Elevated iron stores: Chronic and unchanged.  Desferal as above.   3.  CLL: Bone marrow biopsy did not mention any involvement of CLL.  White blood cell count is within normal limits today.  CT scan results from June 09, 2014 were independently reviewed and did not reveal any evidence of recurrent disease.  Previously, all of her other labwork was either negative or within normal limits. Patient last received single agent Rituxan in February 2015.  4.  Anxiety: Chronic and unchanged.  Continue Xanax as needed.  Patient also received Valium with her blood transfusions. 5.  Hypertension: Chronic and unchanged.  Unclear her compliance with her medications.  Continue follow-up and treatment per primary care. 6.  Back pain: Chronic and unchanged. 7.  Macular degeneration: Continue follow-up with ophthalmology as needed.  I spent a total of 30 minutes reviewing chart data, face-to-face evaluation with the patient, counseling and coordination of care as detailed above.    Patient expressed understanding and was in agreement with this plan. She also understands that She can call clinic at any time with any questions, concerns, or complaints.   Lloyd Huger, MD   09/12/2019 2:03 PM

## 2019-09-10 ENCOUNTER — Other Ambulatory Visit: Payer: Self-pay | Admitting: *Deleted

## 2019-09-10 MED ORDER — ALPRAZOLAM 0.25 MG PO TABS: 0.2500 mg | ORAL_TABLET | Freq: Every day | ORAL | 0 refills | Status: DC | PRN

## 2019-09-11 ENCOUNTER — Inpatient Hospital Stay: Payer: Medicare Other | Attending: Oncology

## 2019-09-11 ENCOUNTER — Other Ambulatory Visit: Payer: Self-pay | Admitting: Oncology

## 2019-09-11 ENCOUNTER — Other Ambulatory Visit: Payer: Self-pay

## 2019-09-11 DIAGNOSIS — Z87891 Personal history of nicotine dependence: Secondary | ICD-10-CM | POA: Insufficient documentation

## 2019-09-11 DIAGNOSIS — C9110 Chronic lymphocytic leukemia of B-cell type not having achieved remission: Secondary | ICD-10-CM | POA: Insufficient documentation

## 2019-09-11 DIAGNOSIS — D46C Myelodysplastic syndrome with isolated del(5q) chromosomal abnormality: Secondary | ICD-10-CM

## 2019-09-11 DIAGNOSIS — F419 Anxiety disorder, unspecified: Secondary | ICD-10-CM | POA: Insufficient documentation

## 2019-09-11 DIAGNOSIS — Z7982 Long term (current) use of aspirin: Secondary | ICD-10-CM | POA: Diagnosis not present

## 2019-09-11 DIAGNOSIS — Z79899 Other long term (current) drug therapy: Secondary | ICD-10-CM | POA: Diagnosis not present

## 2019-09-11 DIAGNOSIS — Z8543 Personal history of malignant neoplasm of ovary: Secondary | ICD-10-CM | POA: Insufficient documentation

## 2019-09-11 DIAGNOSIS — Z90710 Acquired absence of both cervix and uterus: Secondary | ICD-10-CM | POA: Diagnosis not present

## 2019-09-11 DIAGNOSIS — I10 Essential (primary) hypertension: Secondary | ICD-10-CM | POA: Insufficient documentation

## 2019-09-11 LAB — SAMPLE TO BLOOD BANK

## 2019-09-11 LAB — CBC WITH DIFFERENTIAL/PLATELET
Abs Immature Granulocytes: 0.06 10*3/uL (ref 0.00–0.07)
Basophils Absolute: 0.2 10*3/uL — ABNORMAL HIGH (ref 0.0–0.1)
Basophils Relative: 3 %
Eosinophils Absolute: 0.4 10*3/uL (ref 0.0–0.5)
Eosinophils Relative: 5 %
HCT: 19.7 % — ABNORMAL LOW (ref 36.0–46.0)
Hemoglobin: 6.5 g/dL — ABNORMAL LOW (ref 12.0–15.0)
Immature Granulocytes: 1 %
Lymphocytes Relative: 28 %
Lymphs Abs: 2.2 10*3/uL (ref 0.7–4.0)
MCH: 37.1 pg — ABNORMAL HIGH (ref 26.0–34.0)
MCHC: 33 g/dL (ref 30.0–36.0)
MCV: 112.6 fL — ABNORMAL HIGH (ref 80.0–100.0)
Monocytes Absolute: 0.8 10*3/uL (ref 0.1–1.0)
Monocytes Relative: 10 %
Neutro Abs: 4.2 10*3/uL (ref 1.7–7.7)
Neutrophils Relative %: 53 %
Platelets: 371 10*3/uL (ref 150–400)
RBC: 1.75 MIL/uL — ABNORMAL LOW (ref 3.87–5.11)
RDW: 23.3 % — ABNORMAL HIGH (ref 11.5–15.5)
WBC: 8 10*3/uL (ref 4.0–10.5)
nRBC: 0 % (ref 0.0–0.2)

## 2019-09-11 LAB — PREPARE RBC (CROSSMATCH)

## 2019-09-11 LAB — IRON AND TIBC
Iron: 53 ug/dL (ref 28–170)
Saturation Ratios: 21 % (ref 10.4–31.8)
TIBC: 252 ug/dL (ref 250–450)
UIBC: 199 ug/dL

## 2019-09-11 LAB — FERRITIN: Ferritin: 676 ng/mL — ABNORMAL HIGH (ref 11–307)

## 2019-09-12 ENCOUNTER — Inpatient Hospital Stay: Payer: Medicare Other

## 2019-09-12 ENCOUNTER — Inpatient Hospital Stay (HOSPITAL_BASED_OUTPATIENT_CLINIC_OR_DEPARTMENT_OTHER): Payer: Medicare Other | Admitting: Oncology

## 2019-09-12 VITALS — BP 170/77 | HR 82 | Temp 98.2°F | Resp 18

## 2019-09-12 DIAGNOSIS — I6523 Occlusion and stenosis of bilateral carotid arteries: Secondary | ICD-10-CM

## 2019-09-12 DIAGNOSIS — D46C Myelodysplastic syndrome with isolated del(5q) chromosomal abnormality: Secondary | ICD-10-CM | POA: Diagnosis not present

## 2019-09-12 DIAGNOSIS — C9110 Chronic lymphocytic leukemia of B-cell type not having achieved remission: Secondary | ICD-10-CM | POA: Diagnosis not present

## 2019-09-12 MED ORDER — HEPARIN SOD (PORK) LOCK FLUSH 100 UNIT/ML IV SOLN
500.0000 [IU] | Freq: Once | INTRAVENOUS | Status: DC
  Filled 2019-09-12: qty 5

## 2019-09-12 MED ORDER — HEPARIN SOD (PORK) LOCK FLUSH 100 UNIT/ML IV SOLN
INTRAVENOUS | Status: AC
  Filled 2019-09-12: qty 5

## 2019-09-12 MED ORDER — HEPARIN SOD (PORK) LOCK FLUSH 100 UNIT/ML IV SOLN
250.0000 [IU] | INTRAVENOUS | Status: DC | PRN
  Filled 2019-09-12: qty 5

## 2019-09-12 MED ORDER — SODIUM CHLORIDE 0.9% IV SOLUTION
250.0000 mL | Freq: Once | INTRAVENOUS | Status: AC
  Administered 2019-09-12: 250 mL via INTRAVENOUS
  Filled 2019-09-12: qty 250

## 2019-09-12 MED ORDER — DIPHENHYDRAMINE HCL 50 MG/ML IJ SOLN
25.0000 mg | Freq: Once | INTRAMUSCULAR | Status: AC
  Administered 2019-09-12: 25 mg via INTRAVENOUS
  Filled 2019-09-12: qty 1

## 2019-09-12 MED ORDER — SODIUM CHLORIDE 0.9 % IV SOLN
15.0000 mg/kg/h | Freq: Once | INTRAVENOUS | Status: AC
  Administered 2019-09-12: 15 mg/kg/h via INTRAVENOUS
  Filled 2019-09-12: qty 2

## 2019-09-12 MED ORDER — DIAZEPAM 5 MG PO TABS: 5.0000 mg | ORAL_TABLET | Freq: Once | ORAL | Status: DC

## 2019-09-12 MED ORDER — ACETAMINOPHEN 325 MG PO TABS
650.0000 mg | ORAL_TABLET | Freq: Once | ORAL | Status: AC
  Administered 2019-09-12: 650 mg via ORAL
  Filled 2019-09-12: qty 2

## 2019-09-12 MED ORDER — HEPARIN SOD (PORK) LOCK FLUSH 100 UNIT/ML IV SOLN
500.0000 [IU] | Freq: Every day | INTRAVENOUS | Status: DC | PRN
  Filled 2019-09-12: qty 5

## 2019-09-12 MED ORDER — SODIUM CHLORIDE 0.9% FLUSH
10.0000 mL | INTRAVENOUS | Status: DC | PRN
  Filled 2019-09-12: qty 10

## 2019-09-12 MED ORDER — SODIUM CHLORIDE 0.9% FLUSH
3.0000 mL | INTRAVENOUS | Status: DC | PRN
  Filled 2019-09-12: qty 3

## 2019-09-12 MED ORDER — DIAZEPAM 5 MG PO TABS
5.0000 mg | ORAL_TABLET | Freq: Once | ORAL | Status: AC
  Administered 2019-09-12: 5 mg via ORAL
  Filled 2019-09-12: qty 1

## 2019-09-13 LAB — BPAM RBC
Blood Product Expiration Date: 202107032359
Blood Product Expiration Date: 202107052359
Blood Product Expiration Date: 202107062359
ISSUE DATE / TIME: 202106241221
ISSUE DATE / TIME: 202106241423
Unit Type and Rh: 1700
Unit Type and Rh: 9500
Unit Type and Rh: 9500

## 2019-09-13 LAB — TYPE AND SCREEN
ABO/RH(D): AB POS
Antibody Screen: NEGATIVE
Unit division: 0
Unit division: 0
Unit division: 0

## 2019-09-19 ENCOUNTER — Other Ambulatory Visit: Payer: Self-pay | Admitting: *Deleted

## 2019-09-19 MED ORDER — HYDROCODONE-ACETAMINOPHEN 5-325 MG PO TABS: 1.0000 | ORAL_TABLET | ORAL | 0 refills | Status: DC | PRN

## 2019-10-09 ENCOUNTER — Other Ambulatory Visit: Payer: Self-pay

## 2019-10-09 ENCOUNTER — Inpatient Hospital Stay: Payer: Medicare Other | Attending: Oncology

## 2019-10-09 DIAGNOSIS — C9110 Chronic lymphocytic leukemia of B-cell type not having achieved remission: Secondary | ICD-10-CM | POA: Insufficient documentation

## 2019-10-09 DIAGNOSIS — D46C Myelodysplastic syndrome with isolated del(5q) chromosomal abnormality: Secondary | ICD-10-CM

## 2019-10-09 LAB — CBC WITH DIFFERENTIAL/PLATELET
Abs Immature Granulocytes: 0.02 10*3/uL (ref 0.00–0.07)
Basophils Absolute: 0.1 10*3/uL (ref 0.0–0.1)
Basophils Relative: 2 %
Eosinophils Absolute: 0.2 10*3/uL (ref 0.0–0.5)
Eosinophils Relative: 3 %
HCT: 24.4 % — ABNORMAL LOW (ref 36.0–46.0)
Hemoglobin: 8.3 g/dL — ABNORMAL LOW (ref 12.0–15.0)
Immature Granulocytes: 0 %
Lymphocytes Relative: 38 %
Lymphs Abs: 2.3 10*3/uL (ref 0.7–4.0)
MCH: 33.7 pg (ref 26.0–34.0)
MCHC: 34 g/dL (ref 30.0–36.0)
MCV: 99.2 fL (ref 80.0–100.0)
Monocytes Absolute: 0.5 10*3/uL (ref 0.1–1.0)
Monocytes Relative: 8 %
Neutro Abs: 2.8 10*3/uL (ref 1.7–7.7)
Neutrophils Relative %: 49 %
Platelets: 292 10*3/uL (ref 150–400)
RBC: 2.46 MIL/uL — ABNORMAL LOW (ref 3.87–5.11)
RDW: 27 % — ABNORMAL HIGH (ref 11.5–15.5)
Smear Review: ADEQUATE
WBC: 5.9 10*3/uL (ref 4.0–10.5)
nRBC: 0 % (ref 0.0–0.2)

## 2019-10-09 LAB — IRON AND TIBC
Iron: 126 ug/dL (ref 28–170)
Saturation Ratios: 45 % — ABNORMAL HIGH (ref 10.4–31.8)
TIBC: 283 ug/dL (ref 250–450)
UIBC: 157 ug/dL

## 2019-10-09 LAB — FERRITIN: Ferritin: 1008 ng/mL — ABNORMAL HIGH (ref 11–307)

## 2019-10-09 LAB — SAMPLE TO BLOOD BANK

## 2019-10-10 ENCOUNTER — Inpatient Hospital Stay: Payer: Medicare Other

## 2019-10-10 ENCOUNTER — Inpatient Hospital Stay: Payer: Medicare Other | Admitting: Nurse Practitioner

## 2019-10-17 ENCOUNTER — Other Ambulatory Visit: Payer: Self-pay | Admitting: *Deleted

## 2019-10-17 MED ORDER — HYDROCODONE-ACETAMINOPHEN 5-325 MG PO TABS: 1.0000 | ORAL_TABLET | ORAL | 0 refills | Status: DC | PRN

## 2019-10-28 ENCOUNTER — Encounter: Payer: Self-pay | Admitting: Urology

## 2019-11-01 NOTE — Progress Notes (Signed)
[place]  Telephone:(336) [phone] [phone]) [phone]  ID: Jacqueline Russo OB: April 19, 1929  MR#: [id number]  AUQ#:[id number]  Patient Care Team: [name], MD as PCP - General (Internal Medicine) Lloyd Huger, MD as Consulting Physician (Hematology and Oncology)  CHIEF COMPLAINT: MDS 5q-, CLL  INTERVAL HISTORY: Patient returns to clinic today for further evaluation and consideration of a blood transfusion with Desferal.  She is having significant back pain reports she is having ablation in the near future.  She continues to have chronic weakness and fatigue. She otherwise feels well.  She has no neurologic complaints. She denies any recent fevers or illnesses. She denies any night sweats or weight loss.  She denies any chest pain, shortness of breath, cough, or hemoptysis.  She denies any nausea, vomiting, constipation, or diarrhea. She denies any melena or hematochezia. She has no urinary complaints.  Patient offers no further specific complaints today.  REVIEW OF SYSTEMS:   Review of Systems  Constitutional: Positive for malaise/fatigue. Negative for fever and weight loss.  Eyes: Negative.  Negative for blurred vision, double vision and pain.  Respiratory: Negative.  Negative for cough and shortness of breath.   Cardiovascular: Negative.  Negative for chest pain and leg swelling.  Gastrointestinal: Negative.  Negative for abdominal pain, blood in stool and melena.  Genitourinary: Negative.  Negative for dysuria.  Musculoskeletal: Positive for back pain. Negative for joint pain.  Skin: Negative.  Negative for rash.  Neurological: Positive for weakness. Negative for dizziness, sensory change, focal weakness and headaches.  Endo/Heme/Allergies: Does not bruise/bleed easily.  Psychiatric/Behavioral: Negative.  The patient is not nervous/anxious.     As per HPI. Otherwise, a complete review of systems is negative.  PAST MEDICAL HISTORY: Past Medical History:    Diagnosis Date  . Anemia    Myelodysplastic syndrome  . Arthritis   . Hypertension   . Leukemia, chronic lymphoid (HCC)    CLL  . Ovarian cancer ([place]) 1963    PAST SURGICAL HISTORY: Past Surgical History:  Procedure Laterality Date  . ABDOMINAL HYSTERECTOMY    . APPENDECTOMY    . BREAST SURGERY     cyst removed from right breast  . EYE SURGERY Bilateral    Cataract Extractions  . IR FLUORO GUIDE CV LINE RIGHT  06/30/2017  . REVERSE SHOULDER ARTHROPLASTY Right 10/28/2014   Procedure: REVERSE SHOULDER ARTHROPLASTY;  Surgeon: Corky Mull, MD;  Location: [facility] ORS;  Service: Orthopedics;  Laterality: Right;    FAMILY HISTORY: Reviewed and unchanged. No reported history of malignancy or chronic disease.     ADVANCED DIRECTIVES:    HEALTH MAINTENANCE: Social History   Tobacco Use  . Smoking status: Former Smoker    Packs/day: 2.00    Types: Cigarettes    Quit date: 1963    Years since quitting: 58.6  . Smokeless tobacco: Never Used  Vaping Use  . Vaping Use: Never used  Substance Use Topics  . Alcohol use: Not Currently    Alcohol/week: 1.0 standard drink    Types: 1 Glasses of wine per week    Comment: 1 glass of wine daily  . Drug use: No     Colonoscopy:  PAP:  Bone density:  Lipid panel:  Allergies  Allergen Reactions  . Ciprofloxacin Nausea Only    Current Outpatient Medications  Medication Sig Dispense Refill  . ALPRAZolam (XANAX) 0.25 MG tablet Take 1 tablet (0.25 mg total) by mouth daily as needed for anxiety. 90 tablet 0  .  aspirin 81 MG tablet Take 81 mg by mouth daily.    . cyanocobalamin (,VITAMIN B-12,) 1000 MCG/ML injection Inject into the muscle.    . diphenhydrAMINE (BENADRYL) 50 MG tablet Take 50 mg by mouth at bedtime as needed for itching.    . diphenhydrAMINE (SOMINEX) 25 MG tablet Take 25 mg by mouth at bedtime.    Marland Kitchen etodolac (LODINE) 400 MG tablet Take 400 mg by mouth daily.    Marland Kitchen HYDROcodone-acetaminophen (NORCO/VICODIN) 5-325 MG  tablet Take 1-2 tablets by mouth every 4 (four) hours as needed for moderate pain or severe pain. 90 tablet 0  . lidocaine-prilocaine (EMLA) cream Apply 1 application topically as needed. Apply to port 1-2 hours prior to appointment. Cover with plastic wrap. 30 g 3  . magnesium oxide (MAG-OX) 400 (241.3 Mg) MG tablet Take 1 tablet (400 mg total) by mouth daily. 30 tablet 0  . mirtazapine (REMERON) 7.5 MG tablet Take 1 tablet (7.5 mg total) by mouth at bedtime. 30 tablet 2  . Multiple Vitamins-Minerals (PRESERVISION AREDS 2+MULTI VIT PO) Take 1 tablet by mouth 1 day or 1 dose.    . ondansetron (ZOFRAN) 8 MG tablet Take 1 tablet (8 mg total) by mouth every 8 (eight) hours as needed for nausea or vomiting. 20 tablet 0  . potassium chloride SA (KLOR-CON) 20 MEQ tablet Take 1 tablet (20 mEq total) by mouth daily. 10 tablet 0  . tiZANidine (ZANAFLEX) 2 MG tablet Take 1 tablet (2 mg total) by mouth at bedtime as needed for muscle spasms. 30 tablet 0   No current facility-administered medications for this visit.   Facility-Administered Medications Ordered in Other Visits  Medication Dose Route Frequency Provider Last Rate Last Admin  . 0.9 %  sodium chloride infusion (Manually program via Guardrails IV Fluids)  250 mL Intravenous Once Lloyd Huger, MD      . acetaminophen (TYLENOL) tablet 650 mg  650 mg Oral Once Lloyd Huger, MD      . diazepam (VALIUM) tablet 10 mg  10 mg Oral Once Lloyd Huger, MD      . diphenhydrAMINE (BENADRYL) injection 25 mg  25 mg Intravenous Once Lloyd Huger, MD      . heparin lock flush 100 unit/mL  500 Units Intravenous Once Lloyd Huger, MD      . heparin lock flush 100 unit/mL  500 Units Intracatheter Daily PRN Lloyd Huger, MD      . sodium chloride flush (NS) 0.9 % injection 10 mL  10 mL Intravenous PRN Lloyd Huger, MD   10 mL at 09/05/18 4193    OBJECTIVE: There were no vitals filed for this visit.   There is no  height or weight on file to calculate BMI.    ECOG FS:1 - Symptomatic but completely ambulatory  General: Thin, no acute distress. Eyes: Pink conjunctiva, anicteric sclera. HEENT: Normocephalic, moist mucous membranes. Lungs: No audible wheezing or coughing. Heart: Regular rate and rhythm. Abdomen: Soft, nontender, no obvious distention. Musculoskeletal: No edema, cyanosis, or clubbing. Neuro: Alert, answering all questions appropriately. Cranial nerves grossly intact. Skin: No rashes or petechiae noted. Psych: Normal affect.   LAB RESULTS:  Lab Results  Component Value Date   NA 140 08/22/2019   K 3.5 08/22/2019   CL 105 08/22/2019   CO2 25 08/22/2019   GLUCOSE 136 (H) 08/22/2019   BUN 23 08/22/2019   CREATININE 0.94 08/22/2019   CALCIUM 9.4 08/22/2019   PROT 6.4 (  L) 08/22/2019   ALBUMIN 3.6 08/22/2019   AST 13 (L) 08/22/2019   ALT 15 08/22/2019   ALKPHOS 64 08/22/2019   BILITOT 0.6 08/22/2019   GFRNONAA 55 (L) 08/22/2019   GFRAA >60 08/22/2019    Lab Results  Component Value Date   WBC 6.8 11/06/2019   NEUTROABS 3.2 11/06/2019   HGB 7.3 (L) 11/06/2019   HCT 22.2 (L) 11/06/2019   MCV 109.4 (H) 11/06/2019   PLT 226 11/06/2019   Lab Results  Component Value Date   IRON 101 11/06/2019   TIBC NOT CALCULATED 11/06/2019   IRONPCTSAT NOT CALCULATED 11/06/2019   Lab Results  Component Value Date   FERRITIN 730 (H) 11/06/2019     STUDIES: No results found.  ASSESSMENT: MDS 5q-, CLL   PLAN:    1. MDS, 5q-: Bone marrow biopsy results from June 30, 2017 reviewed independently confirming progression of patient's MDS, 5q-. She has a hypercellular marrow for age as well as an increased blast count up to 6%.  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  Patient continues to decline treatment with Revlimid.  Previously, we also discussed the possibility of progressing to AML, but patient wishes no further interventions other  than periodic blood transfusions.  Patient's hemoglobin is 7.3 today.  Proceed with 1 unit packed red blood cells.  She also requires Desferal with every infusion.  Return to clinic in 4 weeks for laboratory work on day 1 and then further evaluation and blood transfusion with Desferal on day 2. 2.  Elevated iron stores: Chronic and unchanged.  Desferal as above.   3.  CLL: Bone marrow biopsy did not mention any involvement of CLL.  White blood cell count is within normal limits today.  CT scan results from June 09, 2014 were independently reviewed and did not reveal any evidence of recurrent disease.  Previously, all of her other labwork was either negative or within normal limits. Patient last received single agent Rituxan in February 2015.  4.  Anxiety: Chronic and unchanged.  Continue Xanax as needed.  Patient also receives 10 mg p.o. Valium with her blood transfusions. 5.  Hypertension: Chronic and unchanged.  Unclear her compliance with her medications.  Continue follow-up and treatment per primary care. 6.  Back pain: Chronic and unchanged.  Patient reports she has a nerve ablation in the near future. 7.  Macular degeneration: Continue follow-up with ophthalmology as needed.   I spent a total of 30 minutes reviewing chart data, face-to-face evaluation with the patient, counseling and coordination of care as detailed above.   Patient expressed understanding and was in agreement with this plan. She also understands that She can call clinic at any time with any questions, concerns, or complaints.   Lloyd Huger, MD   11/07/2019 9:45 AM

## 2019-11-06 ENCOUNTER — Other Ambulatory Visit: Payer: Self-pay | Admitting: Oncology

## 2019-11-06 ENCOUNTER — Inpatient Hospital Stay: Payer: Medicare Other | Attending: Oncology

## 2019-11-06 ENCOUNTER — Other Ambulatory Visit: Payer: Self-pay

## 2019-11-06 DIAGNOSIS — Z87891 Personal history of nicotine dependence: Secondary | ICD-10-CM | POA: Insufficient documentation

## 2019-11-06 DIAGNOSIS — I10 Essential (primary) hypertension: Secondary | ICD-10-CM | POA: Diagnosis not present

## 2019-11-06 DIAGNOSIS — D46C Myelodysplastic syndrome with isolated del(5q) chromosomal abnormality: Secondary | ICD-10-CM

## 2019-11-06 DIAGNOSIS — Z7982 Long term (current) use of aspirin: Secondary | ICD-10-CM | POA: Insufficient documentation

## 2019-11-06 DIAGNOSIS — F419 Anxiety disorder, unspecified: Secondary | ICD-10-CM | POA: Diagnosis not present

## 2019-11-06 DIAGNOSIS — Z79899 Other long term (current) drug therapy: Secondary | ICD-10-CM | POA: Diagnosis not present

## 2019-11-06 DIAGNOSIS — Z8543 Personal history of malignant neoplasm of ovary: Secondary | ICD-10-CM | POA: Insufficient documentation

## 2019-11-06 DIAGNOSIS — Z9049 Acquired absence of other specified parts of digestive tract: Secondary | ICD-10-CM | POA: Diagnosis not present

## 2019-11-06 DIAGNOSIS — D649 Anemia, unspecified: Secondary | ICD-10-CM | POA: Diagnosis present

## 2019-11-06 DIAGNOSIS — C9110 Chronic lymphocytic leukemia of B-cell type not having achieved remission: Secondary | ICD-10-CM | POA: Diagnosis present

## 2019-11-06 LAB — SAMPLE TO BLOOD BANK

## 2019-11-06 LAB — FERRITIN: Ferritin: 730 ng/mL — ABNORMAL HIGH (ref 11–307)

## 2019-11-06 LAB — CBC WITH DIFFERENTIAL/PLATELET
Abs Immature Granulocytes: 0.05 10*3/uL (ref 0.00–0.07)
Basophils Absolute: 0.2 10*3/uL — ABNORMAL HIGH (ref 0.0–0.1)
Basophils Relative: 2 %
Eosinophils Absolute: 0.2 10*3/uL (ref 0.0–0.5)
Eosinophils Relative: 3 %
HCT: 22.2 % — ABNORMAL LOW (ref 36.0–46.0)
Hemoglobin: 7.3 g/dL — ABNORMAL LOW (ref 12.0–15.0)
Immature Granulocytes: 1 %
Lymphocytes Relative: 40 %
Lymphs Abs: 2.7 10*3/uL (ref 0.7–4.0)
MCH: 36 pg — ABNORMAL HIGH (ref 26.0–34.0)
MCHC: 32.9 g/dL (ref 30.0–36.0)
MCV: 109.4 fL — ABNORMAL HIGH (ref 80.0–100.0)
Monocytes Absolute: 0.5 10*3/uL (ref 0.1–1.0)
Monocytes Relative: 7 %
Neutro Abs: 3.2 10*3/uL (ref 1.7–7.7)
Neutrophils Relative %: 47 %
Platelets: 226 10*3/uL (ref 150–400)
RBC: 2.03 MIL/uL — ABNORMAL LOW (ref 3.87–5.11)
RDW: 26.3 % — ABNORMAL HIGH (ref 11.5–15.5)
Smear Review: ADEQUATE
WBC: 6.8 10*3/uL (ref 4.0–10.5)
nRBC: 0 % (ref 0.0–0.2)

## 2019-11-06 LAB — IRON AND TIBC: Iron: 101 ug/dL (ref 28–170)

## 2019-11-07 ENCOUNTER — Inpatient Hospital Stay (HOSPITAL_BASED_OUTPATIENT_CLINIC_OR_DEPARTMENT_OTHER): Payer: Medicare Other | Admitting: Oncology

## 2019-11-07 ENCOUNTER — Inpatient Hospital Stay: Payer: Medicare Other

## 2019-11-07 DIAGNOSIS — I6523 Occlusion and stenosis of bilateral carotid arteries: Secondary | ICD-10-CM | POA: Diagnosis not present

## 2019-11-07 DIAGNOSIS — C9110 Chronic lymphocytic leukemia of B-cell type not having achieved remission: Secondary | ICD-10-CM | POA: Diagnosis not present

## 2019-11-07 DIAGNOSIS — D46C Myelodysplastic syndrome with isolated del(5q) chromosomal abnormality: Secondary | ICD-10-CM | POA: Diagnosis not present

## 2019-11-07 LAB — PREPARE RBC (CROSSMATCH)

## 2019-11-07 MED ORDER — HEPARIN SOD (PORK) LOCK FLUSH 100 UNIT/ML IV SOLN
500.0000 [IU] | Freq: Every day | INTRAVENOUS | Status: AC | PRN
  Administered 2019-11-07: 500 [IU]
  Filled 2019-11-07: qty 5

## 2019-11-07 MED ORDER — SODIUM CHLORIDE 0.9 % IV SOLN
15.0000 mg/kg/h | Freq: Once | INTRAVENOUS | Status: AC
  Administered 2019-11-07: 15 mg/kg/h via INTRAVENOUS
  Filled 2019-11-07: qty 2

## 2019-11-07 MED ORDER — HEPARIN SOD (PORK) LOCK FLUSH 100 UNIT/ML IV SOLN
INTRAVENOUS | Status: AC
  Filled 2019-11-07: qty 5

## 2019-11-07 MED ORDER — SODIUM CHLORIDE 0.9% IV SOLUTION
250.0000 mL | Freq: Once | INTRAVENOUS | Status: AC
  Administered 2019-11-07: 250 mL via INTRAVENOUS
  Filled 2019-11-07: qty 250

## 2019-11-07 MED ORDER — DIPHENHYDRAMINE HCL 50 MG/ML IJ SOLN
25.0000 mg | Freq: Once | INTRAMUSCULAR | Status: AC
  Administered 2019-11-07: 25 mg via INTRAVENOUS
  Filled 2019-11-07: qty 1

## 2019-11-07 MED ORDER — DIAZEPAM 5 MG PO TABS
10.0000 mg | ORAL_TABLET | Freq: Once | ORAL | Status: AC
  Administered 2019-11-07: 10 mg via ORAL
  Filled 2019-11-07: qty 2

## 2019-11-07 MED ORDER — ACETAMINOPHEN 325 MG PO TABS
650.0000 mg | ORAL_TABLET | Freq: Once | ORAL | Status: AC
  Administered 2019-11-07: 650 mg via ORAL
  Filled 2019-11-07: qty 2

## 2019-11-07 MED ORDER — SODIUM CHLORIDE 0.9 % IV SOLN
Freq: Once | INTRAVENOUS | Status: AC
  Filled 2019-11-07: qty 250

## 2019-11-08 LAB — BPAM RBC
Blood Product Expiration Date: 202109142359
ISSUE DATE / TIME: 202108191245
ISSUING PHYSICIAN: NEGATIVE
Unit Type and Rh: 6200

## 2019-11-08 LAB — TYPE AND SCREEN
ABO/RH(D): AB POS
Antibody Screen: NEGATIVE
Unit division: 0

## 2019-11-19 ENCOUNTER — Other Ambulatory Visit: Payer: Self-pay | Admitting: *Deleted

## 2019-11-19 MED ORDER — HYDROCODONE-ACETAMINOPHEN 5-325 MG PO TABS: 1.0000 | ORAL_TABLET | ORAL | 0 refills | Status: DC | PRN

## 2019-12-01 NOTE — Progress Notes (Signed)
[place]  [phone] [phone]  ID: Jacqueline Russo OB: 1929-10-07  MR#: [id number]  HKU#:[id number]  Patient Care Team: [name], MD as PCP - General (Internal Medicine) Lloyd Huger, MD as Consulting Physician (Hematology and Oncology)  CHIEF COMPLAINT: MDS 5q-, CLL  INTERVAL HISTORY: Patient returns to clinic today for further evaluation and consideration of blood transfusion with Desferal. She continues to have significant back pain and her procedure is scheduled for 12/29/2019. She is also complaining of a new left-sided flank pain. She otherwise feels well. She continues to have chronic weakness and fatigue.  She has no neurologic complaints. She denies any recent fevers or illnesses. She denies any night sweats or weight loss.  She denies any chest pain, shortness of breath, cough, or hemoptysis.  She denies any nausea, vomiting, constipation, or diarrhea. She denies any melena or hematochezia. She has no urinary complaints. Patient offers no further specific complaints today.  REVIEW OF SYSTEMS:   Review of Systems  Constitutional: Positive for malaise/fatigue. Negative for fever and weight loss.  Eyes: Negative.  Negative for blurred vision, double vision and pain.  Respiratory: Negative.  Negative for cough and shortness of breath.   Cardiovascular: Negative.  Negative for chest pain and leg swelling.  Gastrointestinal: Negative.  Negative for abdominal pain, blood in stool and melena.  Genitourinary: Positive for flank pain. Negative for dysuria.  Musculoskeletal: Positive for back pain. Negative for joint pain.  Skin: Negative.  Negative for rash.  Neurological: Positive for weakness. Negative for dizziness, sensory change, focal weakness and headaches.  Endo/Heme/Allergies: Does not bruise/bleed easily.  Psychiatric/Behavioral: Negative.  The patient is not nervous/anxious.     As per HPI. Otherwise, a complete review of  systems is negative.  PAST MEDICAL HISTORY: Past Medical History:  Diagnosis Date  . Anemia    Myelodysplastic syndrome  . Arthritis   . Hypertension   . Leukemia, chronic lymphoid (HCC)    CLL  . Ovarian cancer ([place]) 1963    PAST SURGICAL HISTORY: Past Surgical History:  Procedure Laterality Date  . ABDOMINAL HYSTERECTOMY    . APPENDECTOMY    . BREAST SURGERY     cyst removed from right breast  . EYE SURGERY Bilateral    Cataract Extractions  . IR FLUORO GUIDE CV LINE RIGHT  06/30/2017  . REVERSE SHOULDER ARTHROPLASTY Right 10/28/2014   Procedure: REVERSE SHOULDER ARTHROPLASTY;  Surgeon: Corky Mull, MD;  Location: [facility] ORS;  Service: Orthopedics;  Laterality: Right;    FAMILY HISTORY: Reviewed and unchanged. No reported history of malignancy or chronic disease.     ADVANCED DIRECTIVES:    HEALTH MAINTENANCE: Social History   Tobacco Use  . Smoking status: Former Smoker    Packs/day: 2.00    Types: Cigarettes    Quit date: 1963    Years since quitting: 58.7  . Smokeless tobacco: Never Used  Vaping Use  . Vaping Use: Never used  Substance Use Topics  . Alcohol use: Not Currently    Alcohol/week: 1.0 standard drink    Types: 1 Glasses of wine per week    Comment: 1 glass of wine daily  . Drug use: No     Colonoscopy:  PAP:  Bone density:  Lipid panel:  Allergies  Allergen Reactions  . Ciprofloxacin Nausea Only    Current Outpatient Medications  Medication Sig Dispense Refill  . ALPRAZolam (XANAX) 0.25 MG tablet Take 1 tablet (0.25 mg total) by mouth daily as  needed for anxiety. 90 tablet 0  . aspirin 81 MG tablet Take 81 mg by mouth daily.    . cyanocobalamin (,VITAMIN B-12,) 1000 MCG/ML injection Inject into the muscle.    . diphenhydrAMINE (BENADRYL) 50 MG tablet Take 50 mg by mouth at bedtime as needed for itching.    . diphenhydrAMINE (SOMINEX) 25 MG tablet Take 25 mg by mouth at bedtime.    Marland Kitchen etodolac (LODINE) 400 MG tablet Take 400 mg by mouth  daily.    Marland Kitchen HYDROcodone-acetaminophen (NORCO/VICODIN) 5-325 MG tablet Take 1-2 tablets by mouth every 4 (four) hours as needed for moderate pain or severe pain. 90 tablet 0  . lidocaine-prilocaine (EMLA) cream Apply 1 application topically as needed. Apply to port 1-2 hours prior to appointment. Cover with plastic wrap. 30 g 3  . magnesium oxide (MAG-OX) 400 (241.3 Mg) MG tablet Take 1 tablet (400 mg total) by mouth daily. 30 tablet 0  . mirtazapine (REMERON) 7.5 MG tablet Take 1 tablet (7.5 mg total) by mouth at bedtime. 30 tablet 2  . Multiple Vitamins-Minerals (PRESERVISION AREDS 2+MULTI VIT PO) Take 1 tablet by mouth 1 day or 1 dose.    . ondansetron (ZOFRAN) 8 MG tablet Take 1 tablet (8 mg total) by mouth every 8 (eight) hours as needed for nausea or vomiting. 20 tablet 0  . potassium chloride SA (KLOR-CON) 20 MEQ tablet Take 1 tablet (20 mEq total) by mouth daily. 10 tablet 0  . tiZANidine (ZANAFLEX) 2 MG tablet Take 1 tablet (2 mg total) by mouth at bedtime as needed for muscle spasms. 30 tablet 0   No current facility-administered medications for this visit.   Facility-Administered Medications Ordered in Other Visits  Medication Dose Route Frequency Provider Last Rate Last Admin  . 0.9 %  sodium chloride infusion (Manually program via Guardrails IV Fluids)  250 mL Intravenous Once Lloyd Huger, MD      . acetaminophen (TYLENOL) tablet 650 mg  650 mg Oral Once Lloyd Huger, MD      . diazepam (VALIUM) tablet 10 mg  10 mg Oral Once Lloyd Huger, MD      . diphenhydrAMINE (BENADRYL) injection 25 mg  25 mg Intravenous Once Lloyd Huger, MD      . heparin lock flush 100 unit/mL  500 Units Intravenous Once Lloyd Huger, MD      . heparin lock flush 100 unit/mL  500 Units Intracatheter Daily PRN Lloyd Huger, MD      . sodium chloride flush (NS) 0.9 % injection 10 mL  10 mL Intravenous PRN Lloyd Huger, MD   10 mL at 09/05/18 1157  . sodium  chloride flush (NS) 0.9 % injection 10 mL  10 mL Intracatheter PRN Lloyd Huger, MD        OBJECTIVE: There were no vitals filed for this visit.   There is no height or weight on file to calculate BMI.    ECOG FS:1 - Symptomatic but completely ambulatory  General: Thin, no acute distress. Eyes: Pink conjunctiva, anicteric sclera. HEENT: Normocephalic, moist mucous membranes. Lungs: No audible wheezing or coughing. Heart: Regular rate and rhythm. Abdomen: Soft, nontender, no obvious distention. Musculoskeletal: No edema, cyanosis, or clubbing. Neuro: Alert, answering all questions appropriately. Cranial nerves grossly intact. Skin: No rashes or petechiae noted. Psych: Normal affect.   LAB RESULTS:  Lab Results  Component Value Date   NA 140 08/22/2019   K 3.5 08/22/2019   CL  105 08/22/2019   CO2 25 08/22/2019   GLUCOSE 136 (H) 08/22/2019   BUN 23 08/22/2019   CREATININE 0.94 08/22/2019   CALCIUM 9.4 08/22/2019   PROT 6.4 (L) 08/22/2019   ALBUMIN 3.6 08/22/2019   AST 13 (L) 08/22/2019   ALT 15 08/22/2019   ALKPHOS 64 08/22/2019   BILITOT 0.6 08/22/2019   GFRNONAA 55 (L) 08/22/2019   GFRAA >60 08/22/2019    Lab Results  Component Value Date   WBC 7.0 12/04/2019   NEUTROABS 3.1 12/04/2019   HGB 7.2 (L) 12/04/2019   HCT 21.3 (L) 12/04/2019   MCV 110.4 (H) 12/04/2019   PLT 204 12/04/2019   Lab Results  Component Value Date   IRON 96 12/04/2019   TIBC 246 (L) 12/04/2019   IRONPCTSAT 39 (H) 12/04/2019   Lab Results  Component Value Date   FERRITIN 744 (H) 12/04/2019     STUDIES: No results found.  ASSESSMENT: MDS 5q-, CLL   PLAN:    1. MDS, 5q-: Bone marrow biopsy results from June 30, 2017 reviewed independently confirming progression of patient's MDS, 5q-. She has a hypercellular marrow for age as well as an increased blast count up to 6%.  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  Patient continues to  decline treatment with Revlimid.  Previously, we also discussed the possibility of progressing to AML, but patient wishes no further interventions other than periodic blood transfusions. She also has declined any further bone marrow biopsies. Patient's hemoglobin is 7.2 today. Proceed with 1 unit of packed red blood cells. She also requires Desferal with every infusion. Return to clinic in 4 weeks for laboratory work on day one and then further evaluation and blood transfusion with Desferal on day two. 2.  Elevated iron stores: Chronic and unchanged.  Desferal as above.   3.  CLL: Bone marrow biopsy did not mention any involvement of CLL. White blood cell count continues to be within normal limits. CT scan results from June 09, 2014 were independently reviewed and did not reveal any evidence of recurrent disease.  Previously, all of her other labwork was either negative or within normal limits. Patient last received single agent Rituxan in February 2015.  4.  Anxiety: Chronic and unchanged.  Continue Xanax as needed.  Patient also receives 10 mg p.o. Valium with her blood transfusions. 5.  Hypertension: Chronic and unchanged.  Unclear her compliance with her medications.  Continue follow-up and treatment per primary care. 6.  Back pain: Chronic and unchanged.  Patient reports she has a nerve ablation in the near future. This is scheduled for 12/29/2019. 7.  Macular degeneration: Continue follow-up with ophthalmology as needed. 8. Flank pain: Patient has a history of splenomegaly. Will get CT scan of the abdomen pelvis for further evaluation.  I spent a total of 30 minutes reviewing chart data, face-to-face evaluation with the patient, counseling and coordination of care as detailed above.   Patient expressed understanding and was in agreement with this plan. She also understands that She can call clinic at any time with any questions, concerns, or complaints.   Lloyd Huger, MD   12/05/2019 11:19  AM

## 2019-12-04 ENCOUNTER — Other Ambulatory Visit: Payer: Self-pay | Admitting: *Deleted

## 2019-12-04 ENCOUNTER — Other Ambulatory Visit: Payer: Self-pay | Admitting: Oncology

## 2019-12-04 ENCOUNTER — Other Ambulatory Visit: Payer: Self-pay

## 2019-12-04 ENCOUNTER — Inpatient Hospital Stay: Payer: Medicare Other | Attending: Oncology

## 2019-12-04 DIAGNOSIS — Z7982 Long term (current) use of aspirin: Secondary | ICD-10-CM | POA: Insufficient documentation

## 2019-12-04 DIAGNOSIS — I10 Essential (primary) hypertension: Secondary | ICD-10-CM | POA: Insufficient documentation

## 2019-12-04 DIAGNOSIS — H3530 Unspecified macular degeneration: Secondary | ICD-10-CM | POA: Diagnosis not present

## 2019-12-04 DIAGNOSIS — Z8543 Personal history of malignant neoplasm of ovary: Secondary | ICD-10-CM | POA: Insufficient documentation

## 2019-12-04 DIAGNOSIS — Z90710 Acquired absence of both cervix and uterus: Secondary | ICD-10-CM | POA: Diagnosis not present

## 2019-12-04 DIAGNOSIS — Z87891 Personal history of nicotine dependence: Secondary | ICD-10-CM | POA: Insufficient documentation

## 2019-12-04 DIAGNOSIS — F419 Anxiety disorder, unspecified: Secondary | ICD-10-CM | POA: Insufficient documentation

## 2019-12-04 DIAGNOSIS — M549 Dorsalgia, unspecified: Secondary | ICD-10-CM | POA: Insufficient documentation

## 2019-12-04 DIAGNOSIS — M1990 Unspecified osteoarthritis, unspecified site: Secondary | ICD-10-CM | POA: Diagnosis not present

## 2019-12-04 DIAGNOSIS — Z79899 Other long term (current) drug therapy: Secondary | ICD-10-CM | POA: Diagnosis not present

## 2019-12-04 DIAGNOSIS — R109 Unspecified abdominal pain: Secondary | ICD-10-CM | POA: Insufficient documentation

## 2019-12-04 DIAGNOSIS — G8929 Other chronic pain: Secondary | ICD-10-CM | POA: Diagnosis not present

## 2019-12-04 DIAGNOSIS — C9110 Chronic lymphocytic leukemia of B-cell type not having achieved remission: Secondary | ICD-10-CM | POA: Diagnosis present

## 2019-12-04 DIAGNOSIS — D46C Myelodysplastic syndrome with isolated del(5q) chromosomal abnormality: Secondary | ICD-10-CM

## 2019-12-04 LAB — CBC WITH DIFFERENTIAL/PLATELET
Abs Immature Granulocytes: 0.06 10*3/uL (ref 0.00–0.07)
Basophils Absolute: 0.1 10*3/uL (ref 0.0–0.1)
Basophils Relative: 2 %
Eosinophils Absolute: 0.2 10*3/uL (ref 0.0–0.5)
Eosinophils Relative: 2 %
HCT: 21.3 % — ABNORMAL LOW (ref 36.0–46.0)
Hemoglobin: 7.2 g/dL — ABNORMAL LOW (ref 12.0–15.0)
Immature Granulocytes: 1 %
Lymphocytes Relative: 43 %
Lymphs Abs: 3 10*3/uL (ref 0.7–4.0)
MCH: 37.3 pg — ABNORMAL HIGH (ref 26.0–34.0)
MCHC: 33.8 g/dL (ref 30.0–36.0)
MCV: 110.4 fL — ABNORMAL HIGH (ref 80.0–100.0)
Monocytes Absolute: 0.5 10*3/uL (ref 0.1–1.0)
Monocytes Relative: 8 %
Neutro Abs: 3.1 10*3/uL (ref 1.7–7.7)
Neutrophils Relative %: 44 %
Platelets: 204 10*3/uL (ref 150–400)
RBC: 1.93 MIL/uL — ABNORMAL LOW (ref 3.87–5.11)
RDW: 22.6 % — ABNORMAL HIGH (ref 11.5–15.5)
Smear Review: NORMAL
WBC: 7 10*3/uL (ref 4.0–10.5)
nRBC: 0 % (ref 0.0–0.2)

## 2019-12-04 LAB — PREPARE RBC (CROSSMATCH)

## 2019-12-04 LAB — IRON AND TIBC
Iron: 96 ug/dL (ref 28–170)
Saturation Ratios: 39 % — ABNORMAL HIGH (ref 10.4–31.8)
TIBC: 246 ug/dL — ABNORMAL LOW (ref 250–450)
UIBC: 150 ug/dL

## 2019-12-04 LAB — SAMPLE TO BLOOD BANK

## 2019-12-04 LAB — FERRITIN: Ferritin: 744 ng/mL — ABNORMAL HIGH (ref 11–307)

## 2019-12-05 ENCOUNTER — Inpatient Hospital Stay (HOSPITAL_BASED_OUTPATIENT_CLINIC_OR_DEPARTMENT_OTHER): Payer: Medicare Other | Admitting: Oncology

## 2019-12-05 ENCOUNTER — Inpatient Hospital Stay: Payer: Medicare Other

## 2019-12-05 DIAGNOSIS — R109 Unspecified abdominal pain: Secondary | ICD-10-CM | POA: Diagnosis not present

## 2019-12-05 DIAGNOSIS — D46C Myelodysplastic syndrome with isolated del(5q) chromosomal abnormality: Secondary | ICD-10-CM

## 2019-12-05 DIAGNOSIS — I6523 Occlusion and stenosis of bilateral carotid arteries: Secondary | ICD-10-CM | POA: Diagnosis not present

## 2019-12-05 DIAGNOSIS — C9110 Chronic lymphocytic leukemia of B-cell type not having achieved remission: Secondary | ICD-10-CM | POA: Diagnosis not present

## 2019-12-05 IMAGING — MR MR ABDOMEN WO/W CM
20 series · 48 of 48 positions shown · IV contrast (Gadavist)
Comparison: CT abdomen/pelvis dated 12/27/2017

CLINICAL DATA: Renal lesion on CT

EXAM:
MRI ABDOMEN WITHOUT AND WITH CONTRAST
TECHNIQUE: Multiplanar multisequence MR imaging of the abdomen was performed
both before and after the administration of intravenous contrast.
CONTRAST:  5 mL Gadovist IV

[Series 3: cor haste · coronal · 6.0mm · 1.19mm/px · 2 of 29 slices shown]
[im 1/29]
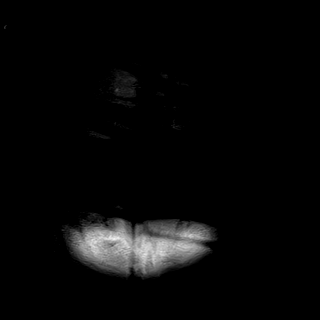
[im 29/29]
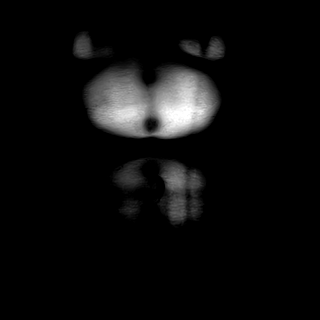

[Series 6: T2 fat-sat · axial · 6.5mm · 1.19mm/px · z∈[-108,+118]mm · 2 of 30 slices shown]
[im 1/30]
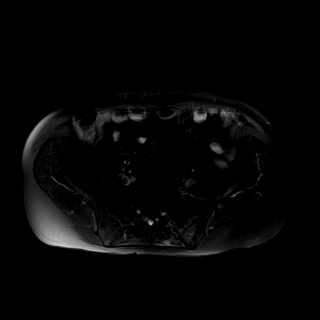
[im 30/30]
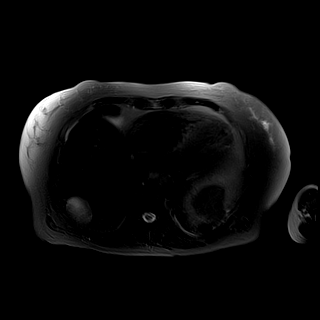

[Series 7: ax in & · axial · 3.2mm · 1.19mm/px · z∈[-111,+116]mm · 4 of 72 slices shown (1 of 2)]
[im 1/72]
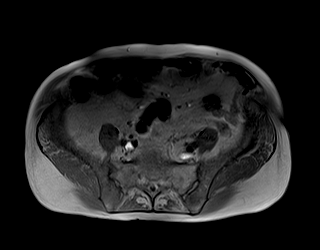
[im 24/72]
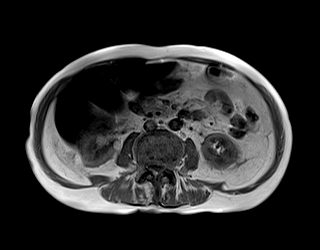
[im 48/72]
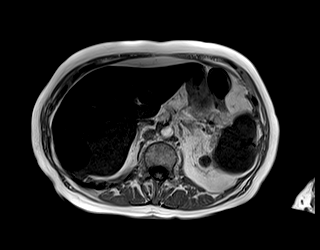
[im 72/72]
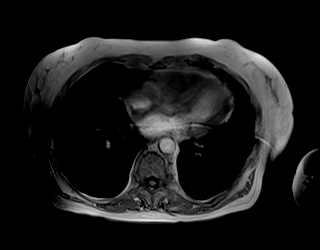

[Series 7: ax in & · axial · 3.2mm · 1.19mm/px · z∈[-111,+116]mm · 4 of 72 slices shown (2 of 2)]
[im 1/72]
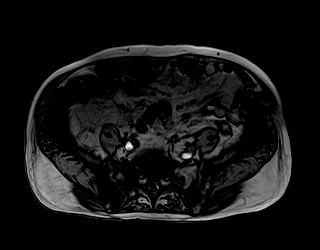
[im 24/72]
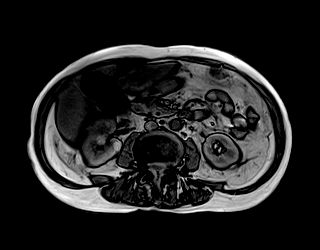
[im 48/72]
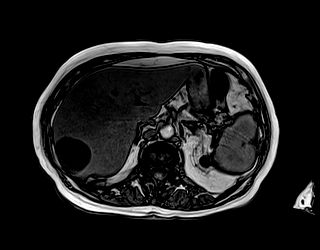
[im 72/72]
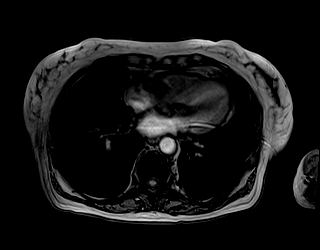

[Series 8: DWI · axial · 6.5mm · 1.42mm/px · 1 of 30 slices shown (1 of 4)]
[im 1/30]
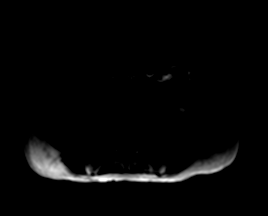

[Series 8: DWI · axial · 6.5mm · 1.42mm/px · 1 of 30 slices shown (2 of 4)]
[im 1/30]
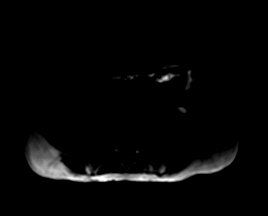

[Series 8: DWI · axial · 6.5mm · 1.42mm/px · 1 of 30 slices shown (3 of 4)]
[im 1/30]
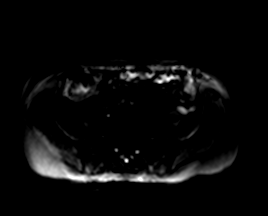

[Series 9: DWI · axial · 6.5mm · 1.42mm/px · 1 of 30 slices shown (4 of 4)]
[im 1/30]
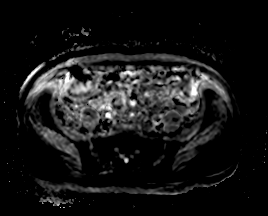

[Series 10: bSSFP · axial · 6.5mm · 0.74mm/px · 1 of 30 slices shown]
[im 1/30]
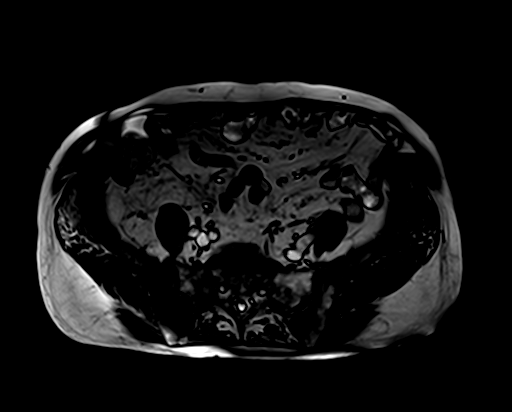

[Series 11: T1 dynamic · axial · non-contrast · 3.3mm · 1.19mm/px · z∈[-116,+118]mm · 3 of 72 slices shown (1 of 9)]
[im 1/72]
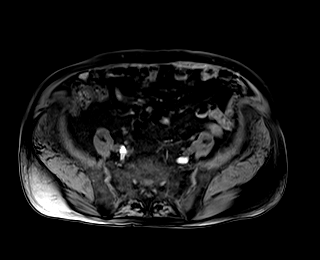
[im 36/72]
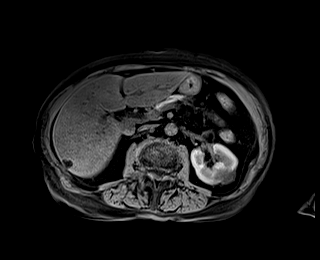
[im 72/72]
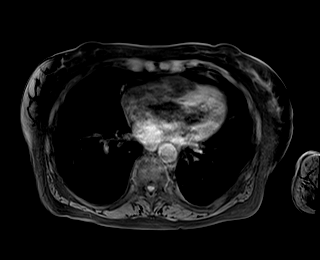

[Series 12: T1 dynamic · axial · 3.3mm · 1.19mm/px · z∈[-116,+118]mm · 3 of 72 slices shown (2 of 9)]
[im 1/72]
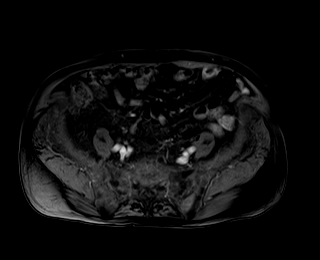
[im 36/72]
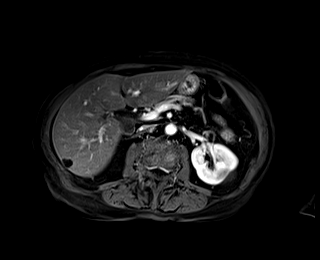
[im 72/72]
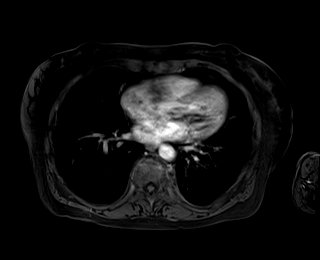

[Series 13: T1 dynamic · axial · 3.3mm · 1.19mm/px · z∈[-116,+118]mm · 3 of 72 slices shown (3 of 9)]
[im 1/72]
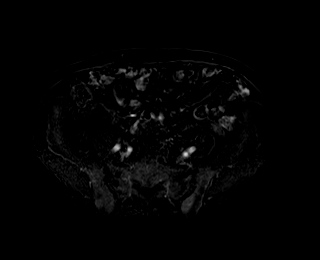
[im 36/72]
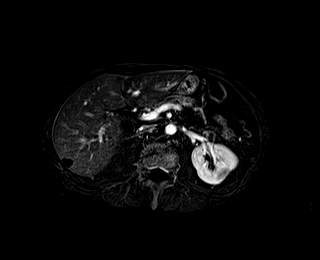
[im 72/72]
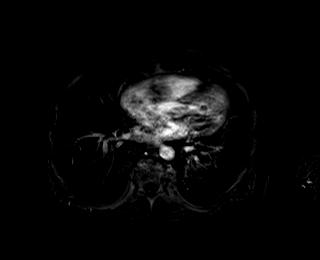

[Series 14: T1 dynamic · axial · 3.3mm · 1.19mm/px · z∈[-116,+118]mm · 3 of 72 slices shown (4 of 9)]
[im 1/72]
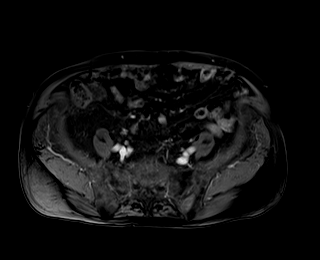
[im 36/72]
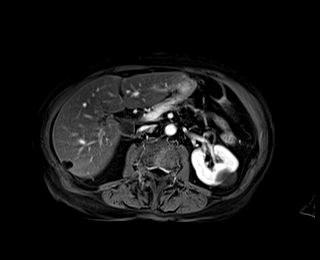
[im 72/72]
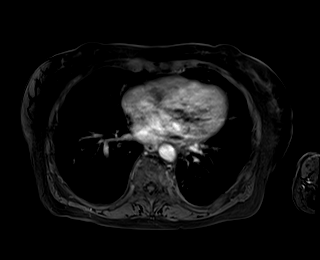

[Series 15: T1 dynamic · axial · 3.3mm · 1.19mm/px · z∈[-116,+118]mm · 3 of 72 slices shown (5 of 9)]
[im 1/72]
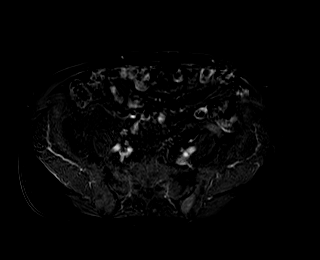
[im 36/72]
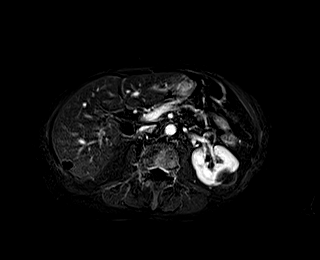
[im 72/72]
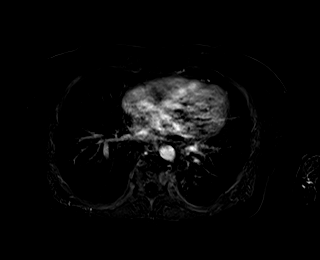

[Series 16: T1 dynamic · axial · 3.3mm · 1.19mm/px · z∈[-116,+118]mm · 3 of 72 slices shown (6 of 9)]
[im 1/72]
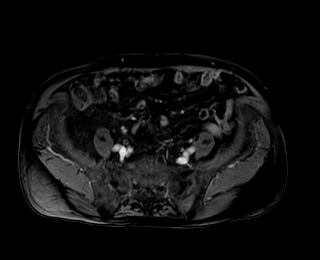
[im 36/72]
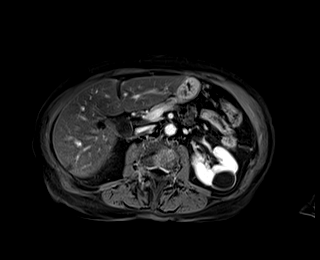
[im 72/72]
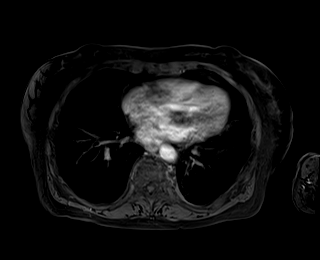

[Series 17: T1 dynamic · axial · 3.3mm · 1.19mm/px · z∈[-116,+118]mm · 3 of 72 slices shown (7 of 9)]
[im 1/72]
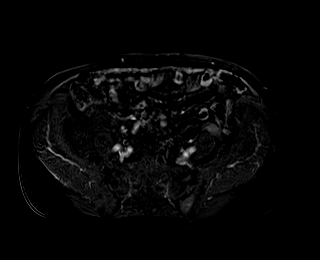
[im 36/72]
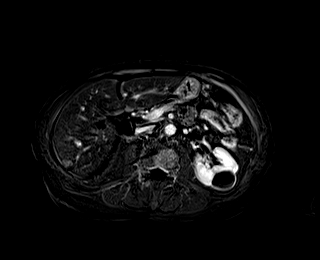
[im 72/72]
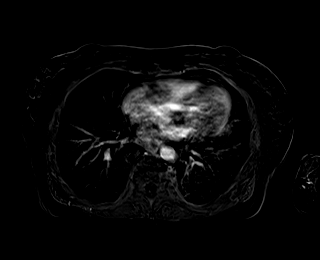

[Series 18: T1 dynamic post-contrast · coronal · 3.0mm · 1.31mm/px · 3 of 72 slices shown]
[im 1/72]
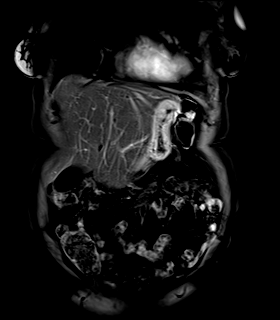
[im 36/72]
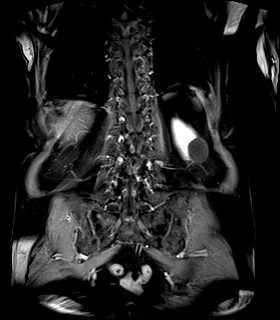
[im 72/72]
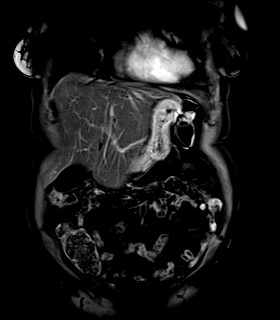

[Series 19: T2 · axial · 6.5mm · 1.19mm/px · 1 of 30 slices shown]
[im 1/30]
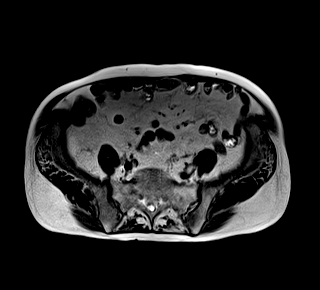

[Series 20: T1 dynamic · axial · 3.3mm · 1.19mm/px · z∈[-116,+118]mm · 3 of 72 slices shown (8 of 9)]
[im 1/72]
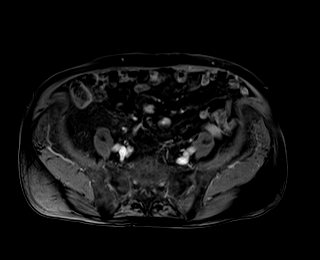
[im 36/72]
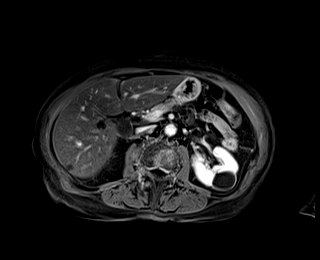
[im 72/72]
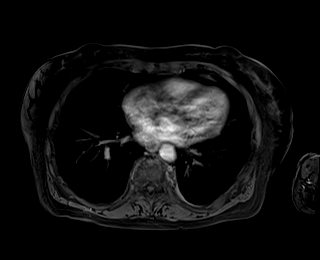

[Series 21: T1 dynamic · axial · 3.3mm · 1.19mm/px · z∈[-116,+118]mm · 3 of 72 slices shown (9 of 9)]
[im 1/72]
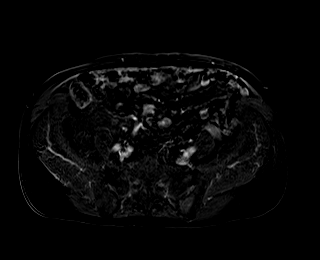
[im 36/72]
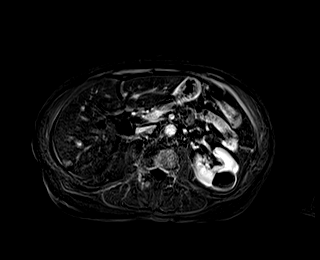
[im 72/72]
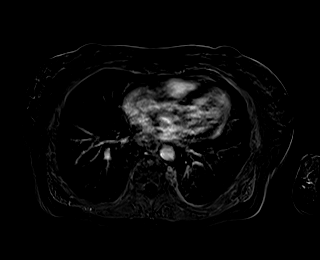

[48 of 48 positions shown; findings below may reference images not displayed]

FINDINGS: Motion degraded images.

Lower chest: Lung bases are clear.

Hepatobiliary: Scattered hepatic cysts, measuring up to 4.6 cm in
segment 7 (series 6/image 4). Moderate hepatic steatosis.

Gallbladder is unremarkable. No intrahepatic or extrahepatic ductal
dilatation.

Pancreas:  Within normal limits.

Spleen:  Mild scarring.

Adrenals/Urinary Tract:  Adrenal glands are within normal limits.

Simple renal cysts, including a dominant 2.9 cm interpolar left
renal cyst (series 6/image 12), benign (Bosniak I).

Multiple hemorrhagic right renal cysts, including a 15 mm cyst in
the posterior right upper kidney (series 11/image 45) and a 16 mm
cyst in the anterior right lower kidney (series 11/image 51),
without enhancement following contrast administration, benign
(Bosniak II).

1.7 x 1.7 x 1.7 cm cystic lesion with thickened, enhancing
septations in the anterior right upper kidney (series 14/image 46),
suspicious for cystic renal neoplasm (Bosniak IV).

No hydronephrosis.

Stomach/Bowel: Stomach is within normal limits.

Visualized bowel is unremarkable.

Vascular/Lymphatic: No evidence of abdominal aortic aneurysm. Single
right renal artery and vein. No renal vein invasion.

No suspicious abdominal lymphadenopathy.

Other:  No abdominal ascites.

Musculoskeletal: No suspicious osseous lesions.
IMPRESSION: 1.7 cm cystic lesion with enhancing thickened septations in the
anterior right upper kidney, suspicious for postictal neoplasm
(Bosniak IV). Single right renal artery and vein. No renal vein
invasion. No evidence of metastatic disease.

Additional hemorrhagic right renal cysts measuring up to 1.6 cm,
benign (Bosniak II). Additional simple renal cysts measuring up to
2.9 cm in the interpolar left kidney, benign (Bosniak I).

## 2019-12-05 MED ORDER — SODIUM CHLORIDE 0.9% FLUSH
10.0000 mL | INTRAVENOUS | Status: AC | PRN
  Administered 2019-12-05: 10 mL
  Filled 2019-12-05: qty 10

## 2019-12-05 MED ORDER — DIAZEPAM 5 MG PO TABS
10.0000 mg | ORAL_TABLET | Freq: Once | ORAL | Status: AC
  Administered 2019-12-05: 10 mg via ORAL
  Filled 2019-12-05: qty 2

## 2019-12-05 MED ORDER — ACETAMINOPHEN 325 MG PO TABS
650.0000 mg | ORAL_TABLET | Freq: Once | ORAL | Status: AC
  Administered 2019-12-05: 650 mg via ORAL
  Filled 2019-12-05: qty 2

## 2019-12-05 MED ORDER — SODIUM CHLORIDE 0.9 % IV SOLN
15.0000 mg/kg/h | Freq: Once | INTRAVENOUS | Status: AC
  Administered 2019-12-05: 15 mg/kg/h via INTRAVENOUS
  Filled 2019-12-05: qty 2

## 2019-12-05 MED ORDER — SODIUM CHLORIDE 0.9% IV SOLUTION
250.0000 mL | Freq: Once | INTRAVENOUS | Status: AC
  Administered 2019-12-05: 250 mL via INTRAVENOUS
  Filled 2019-12-05: qty 250

## 2019-12-05 MED ORDER — SODIUM CHLORIDE 0.9 % IV SOLN
Freq: Once | INTRAVENOUS | Status: AC
  Filled 2019-12-05: qty 250

## 2019-12-05 MED ORDER — DIPHENHYDRAMINE HCL 50 MG/ML IJ SOLN
25.0000 mg | Freq: Once | INTRAMUSCULAR | Status: AC
  Administered 2019-12-05: 25 mg via INTRAVENOUS
  Filled 2019-12-05: qty 1

## 2019-12-05 MED ORDER — HEPARIN SOD (PORK) LOCK FLUSH 100 UNIT/ML IV SOLN
INTRAVENOUS | Status: AC
  Filled 2019-12-05: qty 5

## 2019-12-05 MED ORDER — HEPARIN SOD (PORK) LOCK FLUSH 100 UNIT/ML IV SOLN
500.0000 [IU] | Freq: Every day | INTRAVENOUS | Status: AC | PRN
  Administered 2019-12-05: 500 [IU]
  Filled 2019-12-05: qty 5

## 2019-12-05 NOTE — Progress Notes (Signed)
Patient denies any concerns today.  

## 2019-12-06 LAB — BPAM RBC
Blood Product Expiration Date: 202110062359
Blood Product Expiration Date: 202110072359
ISSUE DATE / TIME: 202109161219
Unit Type and Rh: 5100
Unit Type and Rh: 7300

## 2019-12-06 LAB — TYPE AND SCREEN
ABO/RH(D): AB POS
Antibody Screen: NEGATIVE
Unit division: 0
Unit division: 0

## 2019-12-18 ENCOUNTER — Other Ambulatory Visit: Payer: Self-pay | Admitting: Oncology

## 2019-12-18 ENCOUNTER — Ambulatory Visit: Admission: RE | Admit: 2019-12-18 | Payer: Medicare Other | Source: Ambulatory Visit

## 2019-12-18 MED ORDER — HYDROCODONE-ACETAMINOPHEN 5-325 MG PO TABS: 1.0000 | ORAL_TABLET | ORAL | 0 refills | Status: DC | PRN

## 2019-12-24 ENCOUNTER — Other Ambulatory Visit: Payer: Self-pay

## 2019-12-24 ENCOUNTER — Ambulatory Visit
Admission: RE | Admit: 2019-12-24 | Discharge: 2019-12-24 | Disposition: A | Discharge status: Home / Self Care | Payer: Medicare Other | Source: Ambulatory Visit | Attending: Oncology | Admitting: Oncology

## 2019-12-24 DIAGNOSIS — R109 Unspecified abdominal pain: Secondary | ICD-10-CM | POA: Insufficient documentation

## 2019-12-26 NOTE — Progress Notes (Signed)
[place]  Telephone:(336) [phone] Fax:(336) [phone]  ID: Chaney Born OB: [birth year]  MR#: [id number]  TKZ#:[id number]  Patient Care Team: [name], MD as PCP - General (Internal Medicine) Lloyd Huger, MD as Consulting Physician (Hematology and Oncology)  CHIEF COMPLAINT: MDS 5q-, CLL  INTERVAL HISTORY: Patient returns to clinic today for further evaluation, consideration of blood transfusion, and discussion of her imaging results.  She continues to have chronic back pain, but states her flank pain has mildly improved.  She continues to have chronic weakness and fatigue.  She has no neurologic complaints. She denies any recent fevers or illnesses. She denies any night sweats or weight loss.  She denies any chest pain, shortness of breath, cough, or hemoptysis.  She denies any nausea, vomiting, constipation, or diarrhea. She denies any melena or hematochezia. She has no urinary complaints.  Patient offers no further specific complaints today.  REVIEW OF SYSTEMS:   Review of Systems  Constitutional: Positive for malaise/fatigue. Negative for fever and weight loss.  Eyes: Negative.  Negative for blurred vision, double vision and pain.  Respiratory: Negative.  Negative for cough and shortness of breath.   Cardiovascular: Negative.  Negative for chest pain and leg swelling.  Gastrointestinal: Negative.  Negative for abdominal pain, blood in stool and melena.  Genitourinary: Positive for flank pain. Negative for dysuria.  Musculoskeletal: Positive for back pain. Negative for joint pain.  Skin: Negative.  Negative for rash.  Neurological: Positive for weakness. Negative for dizziness, sensory change, focal weakness and headaches.  Endo/Heme/Allergies: Does not bruise/bleed easily.  Psychiatric/Behavioral: Negative.  The patient is not nervous/anxious.     As per HPI. Otherwise, a complete review of systems is negative.  PAST MEDICAL HISTORY: Past Medical  History:  Diagnosis Date  . Anemia    Myelodysplastic syndrome  . Arthritis   . Hypertension   . Leukemia, chronic lymphoid (HCC)    CLL  . Ovarian cancer ([employer]) 1963    PAST SURGICAL HISTORY: Past Surgical History:  Procedure Laterality Date  . ABDOMINAL HYSTERECTOMY    . APPENDECTOMY    . BREAST SURGERY     cyst removed from right breast  . EYE SURGERY Bilateral    Cataract Extractions  . IR FLUORO GUIDE CV LINE RIGHT  06/30/2017  . REVERSE SHOULDER ARTHROPLASTY Right 10/28/2014   Procedure: REVERSE SHOULDER ARTHROPLASTY;  Surgeon: Corky Mull, MD;  Location: [facility] ORS;  Service: Orthopedics;  Laterality: Right;    FAMILY HISTORY: Reviewed and unchanged. No reported history of malignancy or chronic disease.     ADVANCED DIRECTIVES:    HEALTH MAINTENANCE: Social History   Tobacco Use  . Smoking status: Former Smoker    Packs/day: 2.00    Types: Cigarettes    Quit date: 1963    Years since quitting: 58.8  . Smokeless tobacco: Never Used  Vaping Use  . Vaping Use: Never used  Substance Use Topics  . Alcohol use: Not Currently    Alcohol/week: 1.0 standard drink    Types: 1 Glasses of wine per week    Comment: 1 glass of wine daily  . Drug use: No     Colonoscopy:  PAP:  Bone density:  Lipid panel:  Allergies  Allergen Reactions  . Ciprofloxacin Nausea Only    Current Outpatient Medications  Medication Sig Dispense Refill  . ALPRAZolam (XANAX) 0.25 MG tablet Take 1 tablet (0.25 mg total) by mouth daily as needed for anxiety. 90 tablet 0  .  aspirin 81 MG tablet Take 81 mg by mouth daily.    . cyanocobalamin (,VITAMIN B-12,) 1000 MCG/ML injection Inject into the muscle.    . diltiazem (TIAZAC) 240 MG 24 hr capsule Take by mouth.    . diphenhydrAMINE (BENADRYL) 50 MG tablet Take 50 mg by mouth at bedtime as needed for itching.    . diphenhydrAMINE (SOMINEX) 25 MG tablet Take 25 mg by mouth at bedtime.    Marland Kitchen etodolac (LODINE) 400 MG tablet Take 400 mg by  mouth daily.    . hydrALAZINE (APRESOLINE) 50 MG tablet Take 50 mg by mouth 2 (two) times daily.    Marland Kitchen HYDROcodone-acetaminophen (NORCO/VICODIN) 5-325 MG tablet Take 1-2 tablets by mouth every 4 (four) hours as needed for moderate pain or severe pain. 90 tablet 0  . lidocaine-prilocaine (EMLA) cream Apply 1 application topically as needed. Apply to port 1-2 hours prior to appointment. Cover with plastic wrap. 30 g 3  . mirtazapine (REMERON) 7.5 MG tablet Take 1 tablet (7.5 mg total) by mouth at bedtime. 30 tablet 2  . Multiple Vitamins-Minerals (PRESERVISION AREDS 2+MULTI VIT PO) Take 1 tablet by mouth 1 day or 1 dose.    Marland Kitchen omeprazole (PRILOSEC) 40 MG capsule Take by mouth.    . ondansetron (ZOFRAN) 8 MG tablet Take 1 tablet (8 mg total) by mouth every 8 (eight) hours as needed for nausea or vomiting. 20 tablet 0  . tiZANidine (ZANAFLEX) 2 MG tablet Take 1 tablet (2 mg total) by mouth at bedtime as needed for muscle spasms. 30 tablet 0  . bumetanide (BUMEX) 1 MG tablet Take by mouth. (Patient not taking: Reported on 01/02/2020)    . magnesium oxide (MAG-OX) 400 (241.3 Mg) MG tablet Take 1 tablet (400 mg total) by mouth daily. (Patient not taking: Reported on 01/02/2020) 30 tablet 0  . potassium chloride SA (KLOR-CON) 20 MEQ tablet Take 1 tablet (20 mEq total) by mouth daily. (Patient not taking: Reported on 01/02/2020) 10 tablet 0   No current facility-administered medications for this visit.   Facility-Administered Medications Ordered in Other Visits  Medication Dose Route Frequency Provider Last Rate Last Admin  . heparin lock flush 100 unit/mL  500 Units Intravenous Once Jeralyn Ruths, MD      . sodium chloride flush (NS) 0.9 % injection 10 mL  10 mL Intravenous PRN Jeralyn Ruths, MD   10 mL at 09/05/18 0822    OBJECTIVE: Vitals:   01/02/20 1104 01/02/20 1128  BP: (!) 225/85 (!) 184/90  Pulse: (!) 103   Resp: 18   Temp: 99 F (37.2 C)      Body mass index is 20.12 kg/m.     ECOG FS:1 - Symptomatic but completely ambulatory  General: Thin, no acute distress. Eyes: Pink conjunctiva, anicteric sclera. HEENT: Normocephalic, moist mucous membranes. Lungs: No audible wheezing or coughing. Heart: Regular rate and rhythm. Abdomen: Soft, nontender, no obvious distention. Musculoskeletal: No edema, cyanosis, or clubbing. Neuro: Alert, answering all questions appropriately. Cranial nerves grossly intact. Skin: No rashes or petechiae noted. Psych: Normal affect.   LAB RESULTS:  Lab Results  Component Value Date   NA 140 08/22/2019   K 3.5 08/22/2019   CL 105 08/22/2019   CO2 25 08/22/2019   GLUCOSE 136 (H) 08/22/2019   BUN 23 08/22/2019   CREATININE 0.94 08/22/2019   CALCIUM 9.4 08/22/2019   PROT 6.4 (L) 08/22/2019   ALBUMIN 3.6 08/22/2019   AST 13 (L) 08/22/2019   ALT  15 08/22/2019   ALKPHOS 64 08/22/2019   BILITOT 0.6 08/22/2019   GFRNONAA 55 (L) 08/22/2019   GFRAA >60 08/22/2019    Lab Results  Component Value Date   WBC 11.4 (H) 01/01/2020   NEUTROABS 6.8 01/01/2020   HGB 9.5 (L) 01/01/2020   HCT 27.7 (L) 01/01/2020   MCV 104.1 (H) 01/01/2020   PLT 168 01/01/2020   Lab Results  Component Value Date   IRON 121 01/01/2020   TIBC 269 01/01/2020   IRONPCTSAT 45 (H) 01/01/2020   Lab Results  Component Value Date   FERRITIN 907 (H) 01/01/2020     STUDIES: CT RENAL STONE STUDY  Result Date: 12/24/2019 CLINICAL DATA:  Left flank pain for 3 months.  History of CLL. EXAM: CT ABDOMEN AND PELVIS WITHOUT CONTRAST TECHNIQUE: Multidetector CT imaging of the abdomen and pelvis was performed following the standard protocol without IV contrast. COMPARISON:  12/27/2017 FINDINGS: Lower chest: Chronic interstitial changes with mild bilateral lower lobe bronchiectasis. Tiny left pleural effusion noted. Hepatobiliary: 5.1 cm cyst noted posterior right liver. Additional scattered small low-density lesions are again noted, likely cyst. Small area of low  attenuation in the anterior liver, adjacent to the falciform ligament, is in a characteristic location for focal fatty deposition. There is no evidence for gallstones, gallbladder wall thickening, or pericholecystic fluid. Common bile duct measures 8 mm in the head of pancreas, upper limits of normal for age in stable since prior study. Pancreas: No focal mass lesion. No dilatation of the main duct. No intraparenchymal cyst. No peripancreatic edema. Spleen: Interval development of splenomegaly with spleen measuring 12.1 x 11.9 x 12.0 cm today compared to 8.9 x 6.9 x 4.7 cm on MRI of 01/02/2019. Adrenals/Urinary Tract: No adrenal nodule or mass. 17 mm hyperattenuating lesion lower pole right kidney with 16 mm on previous CT scan 2 years ago compatible with proteinaceous or hemorrhagic cyst. A second subcapsular 10 mm lesion in the posterior lower pole the right kidney is also stable. Right upper pole multicystic neoplasm described in the upper pole right kidney on the 01/02/2019 MRI is not well demonstrated on today's noncontrast CT. Similar appearance 3.1 cm cystic lesion interpolar left kidney. No hydroureter. Bladder is distended. Stomach/Bowel: Stomach is unremarkable. No gastric wall thickening. No evidence of outlet obstruction. Duodenum is normally positioned as is the ligament of Treitz. No small bowel wall thickening. No small bowel dilatation. No gross colonic mass. No colonic wall thickening. Vascular/Lymphatic: There is abdominal aortic atherosclerosis without aneurysm. Increased number of upper normal to borderline enlarged retroperitoneal lymph nodes, progressive in the interval since 01/06/2018. Index portacaval node measuring 10 mm short axis on 35/2 was 5 mm previously. There is new haziness in the central small bowel mesentery with upper normal mesenteric lymph nodes. No pelvic sidewall lymphadenopathy. Reproductive: The uterus is surgically absent. There is no adnexal mass. Other: No  intraperitoneal free fluid. Musculoskeletal: Old right inferior pubic ramus fracture again noted. No worrisome lytic or sclerotic osseous abnormality. Degenerative changes noted lumbar spine. IMPRESSION: 1. Interval splenic enlargement with mild splenomegaly and progressive upper normal to borderline enlarged retroperitoneal and mesenteric lymph nodes. Given the history of CLL, imaging features suggest disease progression. 2. Right upper pole multicystic neoplasm described in the upper pole right kidney on the 01/02/2019 MRI is not well demonstrated on today's noncontrast CT. Bilateral simple and complex renal cysts again noted, as previously evaluated by MRI. 3. Chronic interstitial lung disease. 4. Tiny left pleural effusion. 5. Aortic Atherosclerosis (ICD10-I70.0).  Electronically Signed   By: Misty Stanley M.D.   On: 12/24/2019 12:54    ASSESSMENT: MDS 5q-, CLL   PLAN:    1. MDS, 5q-: Bone marrow biopsy results from June 30, 2017 reviewed independently confirming progression of patient's MDS, 5q-. She has a hypercellular marrow for age as well as an increased blast count up to 6%.  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  Patient continues to decline treatment with Revlimid.  Previously, we also discussed the possibility of progressing to AML, but patient wishes no further interventions other than periodic blood transfusions. She also has declined any further bone marrow biopsies.  CT scan results from December 24, 2019 reviewed independently and report as above with worsening splenomegaly that is also likely related to her underlying MDS.  Patient's hemoglobin has improved, therefore she does not require blood transfusion today.  Return to clinic in 4 weeks for laboratory work on day 1 and evaluation with blood transfusion and Desferal on day 2.   2.  Elevated iron stores: Chronic and unchanged.  Desferal as above.   3.  CLL: Bone marrow biopsy did not mention  any involvement of CLL. White blood cell count continues to be within normal limits.  CT scan results as above with no obvious lymphadenopathy.  Previously, all of her other labwork was either negative or within normal limits. Patient last received single agent Rituxan in February 2015.  4.  Anxiety: Chronic and unchanged.  Continue Xanax as needed.  Patient also receives 10 mg p.o. Valium with her blood transfusions. 5.  Hypertension: Chronic and unchanged.  Unclear her compliance with her medications.  Continue follow-up and treatment per primary care. 6.  Back pain: Chronic and unchanged.  Patient had procedure yesterday but reports no significant change at this time. 7.  Macular degeneration: Continue follow-up with ophthalmology as needed. 8. Flank pain: Secondary to splenomegaly.  I spent a total of 30 minutes reviewing chart data, face-to-face evaluation with the patient, counseling and coordination of care as detailed above.   Patient expressed understanding and was in agreement with this plan. She also understands that She can call clinic at any time with any questions, concerns, or complaints.   Lloyd Huger, MD   01/03/2020 6:31 AM

## 2020-01-01 ENCOUNTER — Other Ambulatory Visit: Payer: Self-pay

## 2020-01-01 ENCOUNTER — Inpatient Hospital Stay: Payer: Medicare Other | Attending: Oncology

## 2020-01-01 DIAGNOSIS — D46C Myelodysplastic syndrome with isolated del(5q) chromosomal abnormality: Secondary | ICD-10-CM

## 2020-01-01 DIAGNOSIS — Z87891 Personal history of nicotine dependence: Secondary | ICD-10-CM | POA: Insufficient documentation

## 2020-01-01 DIAGNOSIS — F419 Anxiety disorder, unspecified: Secondary | ICD-10-CM | POA: Diagnosis not present

## 2020-01-01 DIAGNOSIS — Z8543 Personal history of malignant neoplasm of ovary: Secondary | ICD-10-CM | POA: Diagnosis not present

## 2020-01-01 DIAGNOSIS — R5381 Other malaise: Secondary | ICD-10-CM | POA: Diagnosis not present

## 2020-01-01 DIAGNOSIS — R109 Unspecified abdominal pain: Secondary | ICD-10-CM | POA: Insufficient documentation

## 2020-01-01 DIAGNOSIS — M549 Dorsalgia, unspecified: Secondary | ICD-10-CM | POA: Diagnosis not present

## 2020-01-01 DIAGNOSIS — R161 Splenomegaly, not elsewhere classified: Secondary | ICD-10-CM | POA: Insufficient documentation

## 2020-01-01 DIAGNOSIS — C9110 Chronic lymphocytic leukemia of B-cell type not having achieved remission: Secondary | ICD-10-CM | POA: Insufficient documentation

## 2020-01-01 DIAGNOSIS — G8929 Other chronic pain: Secondary | ICD-10-CM | POA: Diagnosis not present

## 2020-01-01 DIAGNOSIS — R5382 Chronic fatigue, unspecified: Secondary | ICD-10-CM | POA: Diagnosis not present

## 2020-01-01 DIAGNOSIS — R531 Weakness: Secondary | ICD-10-CM | POA: Insufficient documentation

## 2020-01-01 DIAGNOSIS — Z7982 Long term (current) use of aspirin: Secondary | ICD-10-CM | POA: Diagnosis not present

## 2020-01-01 DIAGNOSIS — Z79899 Other long term (current) drug therapy: Secondary | ICD-10-CM | POA: Insufficient documentation

## 2020-01-01 DIAGNOSIS — I10 Essential (primary) hypertension: Secondary | ICD-10-CM | POA: Insufficient documentation

## 2020-01-01 LAB — IRON AND TIBC
Iron: 121 ug/dL (ref 28–170)
Saturation Ratios: 45 % — ABNORMAL HIGH (ref 10.4–31.8)
TIBC: 269 ug/dL (ref 250–450)
UIBC: 148 ug/dL

## 2020-01-01 LAB — CBC WITH DIFFERENTIAL/PLATELET
Abs Immature Granulocytes: 0.14 10*3/uL — ABNORMAL HIGH (ref 0.00–0.07)
Basophils Absolute: 0.2 10*3/uL — ABNORMAL HIGH (ref 0.0–0.1)
Basophils Relative: 2 %
Eosinophils Absolute: 0 10*3/uL (ref 0.0–0.5)
Eosinophils Relative: 0 %
HCT: 27.7 % — ABNORMAL LOW (ref 36.0–46.0)
Hemoglobin: 9.5 g/dL — ABNORMAL LOW (ref 12.0–15.0)
Immature Granulocytes: 1 %
Lymphocytes Relative: 33 %
Lymphs Abs: 3.8 10*3/uL (ref 0.7–4.0)
MCH: 35.7 pg — ABNORMAL HIGH (ref 26.0–34.0)
MCHC: 34.3 g/dL (ref 30.0–36.0)
MCV: 104.1 fL — ABNORMAL HIGH (ref 80.0–100.0)
Monocytes Absolute: 0.5 10*3/uL (ref 0.1–1.0)
Monocytes Relative: 4 %
Neutro Abs: 6.8 10*3/uL (ref 1.7–7.7)
Neutrophils Relative %: 60 %
Platelets: 168 10*3/uL (ref 150–400)
RBC: 2.66 MIL/uL — ABNORMAL LOW (ref 3.87–5.11)
RDW: 21.3 % — ABNORMAL HIGH (ref 11.5–15.5)
WBC: 11.4 10*3/uL — ABNORMAL HIGH (ref 4.0–10.5)
nRBC: 0 % (ref 0.0–0.2)

## 2020-01-01 LAB — SAMPLE TO BLOOD BANK

## 2020-01-01 LAB — FERRITIN: Ferritin: 907 ng/mL — ABNORMAL HIGH (ref 11–307)

## 2020-01-02 ENCOUNTER — Inpatient Hospital Stay: Payer: Medicare Other

## 2020-01-02 ENCOUNTER — Inpatient Hospital Stay (HOSPITAL_BASED_OUTPATIENT_CLINIC_OR_DEPARTMENT_OTHER): Payer: Medicare Other | Admitting: Oncology

## 2020-01-02 ENCOUNTER — Encounter: Payer: Self-pay | Admitting: Oncology

## 2020-01-02 VITALS — BP 184/90 | HR 103 | Temp 99.0°F | Resp 18 | Wt 103.0 lb

## 2020-01-02 DIAGNOSIS — I6523 Occlusion and stenosis of bilateral carotid arteries: Secondary | ICD-10-CM | POA: Diagnosis not present

## 2020-01-02 DIAGNOSIS — C9110 Chronic lymphocytic leukemia of B-cell type not having achieved remission: Secondary | ICD-10-CM | POA: Diagnosis not present

## 2020-01-02 DIAGNOSIS — D46C Myelodysplastic syndrome with isolated del(5q) chromosomal abnormality: Secondary | ICD-10-CM | POA: Diagnosis not present

## 2020-01-02 NOTE — Progress Notes (Signed)
Pt in for follow up, very nervous today, had a hard time parking.  Pt states BP was 218/? Yesterday at [place] clinic.  Pt states "had a procedure done on my back".  MD notified of elevated BP today.

## 2020-01-06 ENCOUNTER — Ambulatory Visit: Payer: Medicare Other | Admitting: Urology

## 2020-01-08 ENCOUNTER — Ambulatory Visit: Payer: Medicare Other | Admitting: Urology

## 2020-01-08 ENCOUNTER — Encounter: Payer: Self-pay | Admitting: Urology

## 2020-01-26 NOTE — Progress Notes (Signed)
[place]  Telephone:(336) [phone] Fax:(336) [phone]  ID: Jacqueline Russo OB: 1929-07-27  MR#: [id number]  CLE#:[id number]  Patient Care Team: [name], MD as PCP - General (Internal Medicine) Lloyd Huger, MD as Consulting Physician (Hematology and Oncology)  CHIEF COMPLAINT: MDS 5q-, CLL  INTERVAL HISTORY: Patient returns to clinic today for further evaluation and consideration of blood transfusion.  She continues to have chronic back pain, but states this has improved since her procedure several weeks ago. She has no neurologic complaints. She denies any recent fevers or illnesses. She denies any night sweats or weight loss.  She denies any chest pain, shortness of breath, cough, or hemoptysis.  She denies any nausea, vomiting, constipation, or diarrhea. She denies any melena or hematochezia. She has no urinary complaints.  Patient offers no further specific complaints today.  REVIEW OF SYSTEMS:   Review of Systems  Constitutional: Positive for malaise/fatigue. Negative for fever and weight loss.  Eyes: Negative.  Negative for blurred vision, double vision and pain.  Respiratory: Negative.  Negative for cough and shortness of breath.   Cardiovascular: Negative.  Negative for chest pain and leg swelling.  Gastrointestinal: Negative.  Negative for abdominal pain, blood in stool and melena.  Genitourinary: Negative.  Negative for dysuria and flank pain.  Musculoskeletal: Positive for back pain. Negative for joint pain.  Skin: Negative.  Negative for rash.  Neurological: Positive for weakness. Negative for dizziness, sensory change, focal weakness and headaches.  Endo/Heme/Allergies: Does not bruise/bleed easily.  Psychiatric/Behavioral: Negative.  The patient is not nervous/anxious.     As per HPI. Otherwise, a complete review of systems is negative.  PAST MEDICAL HISTORY: Past Medical History:  Diagnosis Date  . Anemia    Myelodysplastic syndrome   . Arthritis   . Hypertension   . Leukemia, chronic lymphoid (HCC)    CLL  . Ovarian cancer ([place]) 1963    PAST SURGICAL HISTORY: Past Surgical History:  Procedure Laterality Date  . ABDOMINAL HYSTERECTOMY    . APPENDECTOMY    . BREAST SURGERY     cyst removed from right breast  . EYE SURGERY Bilateral    Cataract Extractions  . IR FLUORO GUIDE CV LINE RIGHT  06/30/2017  . REVERSE SHOULDER ARTHROPLASTY Right 10/28/2014   Procedure: REVERSE SHOULDER ARTHROPLASTY;  Surgeon: Corky Mull, MD;  Location: [facility] ORS;  Service: Orthopedics;  Laterality: Right;    FAMILY HISTORY: Reviewed and unchanged. No reported history of malignancy or chronic disease.     ADVANCED DIRECTIVES:    HEALTH MAINTENANCE: Social History   Tobacco Use  . Smoking status: Former Smoker    Packs/day: 2.00    Types: Cigarettes    Quit date: 1963    Years since quitting: 58.9  . Smokeless tobacco: Never Used  Vaping Use  . Vaping Use: Never used  Substance Use Topics  . Alcohol use: Not Currently    Alcohol/week: 1.0 standard drink    Types: 1 Glasses of wine per week    Comment: 1 glass of wine daily  . Drug use: No     Colonoscopy:  PAP:  Bone density:  Lipid panel:  Allergies  Allergen Reactions  . Ciprofloxacin Nausea Only    Current Outpatient Medications  Medication Sig Dispense Refill  . ALPRAZolam (XANAX) 0.25 MG tablet Take 1 tablet (0.25 mg total) by mouth daily as needed for anxiety. 90 tablet 0  . aspirin 81 MG tablet Take 81 mg by mouth daily.    Marland Kitchen  bumetanide (BUMEX) 1 MG tablet Take by mouth. (Patient not taking: Reported on 01/02/2020)    . cyanocobalamin (,VITAMIN B-12,) 1000 MCG/ML injection Inject into the muscle.    . diltiazem (TIAZAC) 240 MG 24 hr capsule Take by mouth.    . diphenhydrAMINE (BENADRYL) 50 MG tablet Take 50 mg by mouth at bedtime as needed for itching.    . diphenhydrAMINE (SOMINEX) 25 MG tablet Take 25 mg by mouth at bedtime.    Marland Kitchen etodolac (LODINE)  400 MG tablet Take 400 mg by mouth daily.    . hydrALAZINE (APRESOLINE) 50 MG tablet Take 50 mg by mouth 2 (two) times daily.    Marland Kitchen HYDROcodone-acetaminophen (NORCO/VICODIN) 5-325 MG tablet Take 1-2 tablets by mouth every 4 (four) hours as needed for moderate pain or severe pain. 90 tablet 0  . lidocaine-prilocaine (EMLA) cream Apply 1 application topically as needed. Apply to port 1-2 hours prior to appointment. Cover with plastic wrap. 30 g 3  . magnesium oxide (MAG-OX) 400 (241.3 Mg) MG tablet Take 1 tablet (400 mg total) by mouth daily. (Patient not taking: Reported on 01/02/2020) 30 tablet 0  . mirtazapine (REMERON) 7.5 MG tablet Take 1 tablet (7.5 mg total) by mouth at bedtime. 30 tablet 2  . Multiple Vitamins-Minerals (PRESERVISION AREDS 2+MULTI VIT PO) Take 1 tablet by mouth 1 day or 1 dose.    Marland Kitchen omeprazole (PRILOSEC) 40 MG capsule Take by mouth.    . ondansetron (ZOFRAN) 8 MG tablet Take 1 tablet (8 mg total) by mouth every 8 (eight) hours as needed for nausea or vomiting. 20 tablet 0  . potassium chloride SA (KLOR-CON) 20 MEQ tablet Take 1 tablet (20 mEq total) by mouth daily. (Patient not taking: Reported on 01/02/2020) 10 tablet 0  . tiZANidine (ZANAFLEX) 2 MG tablet Take 1 tablet (2 mg total) by mouth at bedtime as needed for muscle spasms. 30 tablet 0   No current facility-administered medications for this visit.   Facility-Administered Medications Ordered in Other Visits  Medication Dose Route Frequency Provider Last Rate Last Admin  . heparin lock flush 100 unit/mL  500 Units Intravenous Once Lloyd Huger, MD      . sodium chloride flush (NS) 0.9 % injection 10 mL  10 mL Intravenous PRN Lloyd Huger, MD   10 mL at 09/05/18 0240    OBJECTIVE: There were no vitals filed for this visit.   There is no height or weight on file to calculate BMI.    ECOG FS:1 - Symptomatic but completely ambulatory  General: Thin, no acute distress. Eyes: Pink conjunctiva, anicteric  sclera. HEENT: Normocephalic, moist mucous membranes. Lungs: No audible wheezing or coughing. Heart: Regular rate and rhythm. Abdomen: Soft, nontender, no obvious distention. Musculoskeletal: No edema, cyanosis, or clubbing. Neuro: Alert, answering all questions appropriately. Cranial nerves grossly intact. Skin: No rashes or petechiae noted. Psych: Normal affect.   LAB RESULTS:  Lab Results  Component Value Date   NA 140 08/22/2019   K 3.5 08/22/2019   CL 105 08/22/2019   CO2 25 08/22/2019   GLUCOSE 136 (H) 08/22/2019   BUN 23 08/22/2019   CREATININE 0.94 08/22/2019   CALCIUM 9.4 08/22/2019   PROT 6.4 (L) 08/22/2019   ALBUMIN 3.6 08/22/2019   AST 13 (L) 08/22/2019   ALT 15 08/22/2019   ALKPHOS 64 08/22/2019   BILITOT 0.6 08/22/2019   GFRNONAA 55 (L) 08/22/2019   GFRAA >60 08/22/2019    Lab Results  Component Value Date  WBC 27.2 (H) 01/29/2020   NEUTROABS 5.7 01/29/2020   HGB 6.8 (L) 01/29/2020   HCT 20.2 (L) 01/29/2020   MCV 109.8 (H) 01/29/2020   PLT 109 (L) 01/29/2020   Lab Results  Component Value Date   IRON 153 01/29/2020   TIBC 290 01/29/2020   IRONPCTSAT 53 (H) 01/29/2020   Lab Results  Component Value Date   FERRITIN 1,038 (H) 01/29/2020     STUDIES: No results found.  ASSESSMENT: MDS 5q-, CLL   PLAN:    1. MDS, 5q-: Bone marrow biopsy results from June 30, 2017 reviewed independently confirming progression of patient's MDS, 5q-. She has a hypercellular marrow for age as well as an increased blast count up to 6%.  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  Patient continues to decline treatment with Revlimid.  Previously, we also discussed the possibility of progressing to AML, but patient wishes no further interventions other than periodic blood transfusions. She also has declined any further bone marrow biopsies.  CT scan results from December 24, 2019 reviewed independently with worsening splenomegaly  that is also likely related to her underlying MDS.  Patient's hemoglobin has declined to 6.8, therefore will proceed with 1 unit of packed red blood cells.  Patient also receives Desferal with every infusion.  Return to clinic in 4 weeks for laboratory work on day 1 and then evaluation and consideration of transfusion on day 2. 2.  Elevated iron stores: Chronic and unchanged.  Desferal as above.   3.  CLL: Bone marrow biopsy did not mention any involvement of CLL. White blood cell count is significantly elevated today with a lymphocyte shift.  CT scan results as above with no obvious lymphadenopathy.  Previously, all of her other labwork was either negative or within normal limits. Patient last received single agent Rituxan in February 2015.  4.  Anxiety: Chronic and unchanged.  Continue Xanax as needed.  Patient also receives 10 mg p.o. Valium with her blood transfusions. 5.  Hypertension: Chronic and unchanged.  Unclear her compliance with her medications.  Continue follow-up and treatment per primary care. 6.  Back pain: Mildly improved. 7.  Macular degeneration: Continue follow-up with ophthalmology as needed. 8. Flank pain: Patient does not complain of this today.  Secondary to splenomegaly. 9.  Thrombocytopenia: Multifactorial including splenomegaly and underlying MDS.  Monitor.  I spent a total of 30 minutes reviewing chart data, face-to-face evaluation with the patient, counseling and coordination of care as detailed above.   Patient expressed understanding and was in agreement with this plan. She also understands that She can call clinic at any time with any questions, concerns, or complaints.   Lloyd Huger, MD   01/31/2020 8:43 AM

## 2020-01-29 ENCOUNTER — Other Ambulatory Visit: Payer: Self-pay | Admitting: *Deleted

## 2020-01-29 ENCOUNTER — Inpatient Hospital Stay: Payer: Medicare Other | Attending: Oncology

## 2020-01-29 ENCOUNTER — Other Ambulatory Visit: Payer: Self-pay | Admitting: Oncology

## 2020-01-29 ENCOUNTER — Other Ambulatory Visit: Payer: Self-pay

## 2020-01-29 DIAGNOSIS — Z87891 Personal history of nicotine dependence: Secondary | ICD-10-CM | POA: Diagnosis not present

## 2020-01-29 DIAGNOSIS — Z9049 Acquired absence of other specified parts of digestive tract: Secondary | ICD-10-CM | POA: Insufficient documentation

## 2020-01-29 DIAGNOSIS — D46C Myelodysplastic syndrome with isolated del(5q) chromosomal abnormality: Secondary | ICD-10-CM

## 2020-01-29 DIAGNOSIS — C9110 Chronic lymphocytic leukemia of B-cell type not having achieved remission: Secondary | ICD-10-CM | POA: Diagnosis not present

## 2020-01-29 DIAGNOSIS — D649 Anemia, unspecified: Secondary | ICD-10-CM | POA: Diagnosis not present

## 2020-01-29 DIAGNOSIS — D696 Thrombocytopenia, unspecified: Secondary | ICD-10-CM | POA: Diagnosis not present

## 2020-01-29 DIAGNOSIS — F419 Anxiety disorder, unspecified: Secondary | ICD-10-CM | POA: Diagnosis not present

## 2020-01-29 DIAGNOSIS — Z79899 Other long term (current) drug therapy: Secondary | ICD-10-CM | POA: Diagnosis not present

## 2020-01-29 DIAGNOSIS — Z7982 Long term (current) use of aspirin: Secondary | ICD-10-CM | POA: Insufficient documentation

## 2020-01-29 DIAGNOSIS — Z8543 Personal history of malignant neoplasm of ovary: Secondary | ICD-10-CM | POA: Diagnosis not present

## 2020-01-29 DIAGNOSIS — I10 Essential (primary) hypertension: Secondary | ICD-10-CM | POA: Diagnosis not present

## 2020-01-29 LAB — CBC WITH DIFFERENTIAL/PLATELET
Abs Immature Granulocytes: 0 10*3/uL (ref 0.00–0.07)
Band Neutrophils: 3 %
Basophils Absolute: 0 10*3/uL (ref 0.0–0.1)
Basophils Relative: 0 %
Eosinophils Absolute: 1.4 10*3/uL — ABNORMAL HIGH (ref 0.0–0.5)
Eosinophils Relative: 5 %
HCT: 20.2 % — ABNORMAL LOW (ref 36.0–46.0)
Hemoglobin: 6.8 g/dL — ABNORMAL LOW (ref 12.0–15.0)
Lymphocytes Relative: 68 %
Lymphs Abs: 18.5 10*3/uL — ABNORMAL HIGH (ref 0.7–4.0)
MCH: 37 pg — ABNORMAL HIGH (ref 26.0–34.0)
MCHC: 33.7 g/dL (ref 30.0–36.0)
MCV: 109.8 fL — ABNORMAL HIGH (ref 80.0–100.0)
Monocytes Absolute: 1.6 10*3/uL — ABNORMAL HIGH (ref 0.1–1.0)
Monocytes Relative: 6 %
Neutro Abs: 5.7 10*3/uL (ref 1.7–7.7)
Neutrophils Relative %: 18 %
Platelets: 109 10*3/uL — ABNORMAL LOW (ref 150–400)
RBC: 1.84 MIL/uL — ABNORMAL LOW (ref 3.87–5.11)
RDW: 22.9 % — ABNORMAL HIGH (ref 11.5–15.5)
Smear Review: DECREASED
WBC: 27.2 10*3/uL — ABNORMAL HIGH (ref 4.0–10.5)
nRBC: 0.1 % (ref 0.0–0.2)

## 2020-01-29 LAB — IRON AND TIBC
Iron: 153 ug/dL (ref 28–170)
Saturation Ratios: 53 % — ABNORMAL HIGH (ref 10.4–31.8)
TIBC: 290 ug/dL (ref 250–450)
UIBC: 137 ug/dL

## 2020-01-29 LAB — FERRITIN: Ferritin: 1038 ng/mL — ABNORMAL HIGH (ref 11–307)

## 2020-01-29 LAB — PREPARE RBC (CROSSMATCH)

## 2020-01-30 ENCOUNTER — Inpatient Hospital Stay (HOSPITAL_BASED_OUTPATIENT_CLINIC_OR_DEPARTMENT_OTHER): Payer: Medicare Other | Admitting: Oncology

## 2020-01-30 ENCOUNTER — Inpatient Hospital Stay: Payer: Medicare Other

## 2020-01-30 DIAGNOSIS — D46C Myelodysplastic syndrome with isolated del(5q) chromosomal abnormality: Secondary | ICD-10-CM

## 2020-01-30 DIAGNOSIS — C9110 Chronic lymphocytic leukemia of B-cell type not having achieved remission: Secondary | ICD-10-CM | POA: Diagnosis not present

## 2020-01-30 DIAGNOSIS — I6523 Occlusion and stenosis of bilateral carotid arteries: Secondary | ICD-10-CM | POA: Diagnosis not present

## 2020-01-30 LAB — SAMPLE TO BLOOD BANK

## 2020-01-30 MED ORDER — ACETAMINOPHEN 325 MG PO TABS
650.0000 mg | ORAL_TABLET | Freq: Once | ORAL | Status: AC
  Administered 2020-01-30: 650 mg via ORAL
  Filled 2020-01-30: qty 2

## 2020-01-30 MED ORDER — SODIUM CHLORIDE 0.9% IV SOLUTION
250.0000 mL | Freq: Once | INTRAVENOUS | Status: AC
  Administered 2020-01-30: 250 mL via INTRAVENOUS
  Filled 2020-01-30: qty 250

## 2020-01-30 MED ORDER — SODIUM CHLORIDE 0.9 % IV SOLN
15.0000 mg/kg/h | Freq: Once | INTRAVENOUS | Status: AC
  Administered 2020-01-30: 15 mg/kg/h via INTRAVENOUS
  Filled 2020-01-30: qty 2

## 2020-01-30 MED ORDER — DIAZEPAM 5 MG PO TABS
10.0000 mg | ORAL_TABLET | Freq: Once | ORAL | Status: AC
  Administered 2020-01-30: 10 mg via ORAL
  Filled 2020-01-30: qty 2

## 2020-01-30 MED ORDER — HEPARIN SOD (PORK) LOCK FLUSH 100 UNIT/ML IV SOLN
INTRAVENOUS | Status: AC
  Filled 2020-01-30: qty 5

## 2020-01-30 MED ORDER — DIPHENHYDRAMINE HCL 50 MG/ML IJ SOLN
25.0000 mg | Freq: Once | INTRAMUSCULAR | Status: AC
  Administered 2020-01-30: 25 mg via INTRAVENOUS
  Filled 2020-01-30: qty 1

## 2020-01-30 MED ORDER — SODIUM CHLORIDE 0.9% FLUSH
10.0000 mL | INTRAVENOUS | Status: AC | PRN
  Administered 2020-01-30: 10 mL
  Filled 2020-01-30: qty 10

## 2020-01-30 NOTE — Progress Notes (Signed)
Pt tolerated blood transfusion well with no complications. Vitals reviewed with MD prior to the start of blood transfusion. Per MD to continue with transfusion. VSS prior to discharge. Pt has no complaints. RN educated pt on the importance of notifying the clinic if any complications occur at home, pt verbalized understanding. Pt stable for discharge. Pt discharged home.   Cuyler Vandyken [employer]

## 2020-01-31 LAB — BPAM RBC
Blood Product Expiration Date: 202111222359
ISSUE DATE / TIME: 202111111230
Unit Type and Rh: 6200

## 2020-01-31 LAB — TYPE AND SCREEN
ABO/RH(D): AB POS
Antibody Screen: NEGATIVE
Unit division: 0

## 2020-01-31 MED ORDER — HYDROCODONE-ACETAMINOPHEN 5-325 MG PO TABS
1.0000 | ORAL_TABLET | ORAL | 0 refills | Status: DC | PRN
Start: 2020-01-31 — End: 2020-03-05

## 2020-02-20 ENCOUNTER — Other Ambulatory Visit: Payer: Self-pay | Admitting: Oncology

## 2020-02-20 ENCOUNTER — Inpatient Hospital Stay: Payer: Medicare Other | Attending: Oncology

## 2020-02-20 ENCOUNTER — Other Ambulatory Visit: Payer: Self-pay | Admitting: *Deleted

## 2020-02-20 ENCOUNTER — Other Ambulatory Visit: Payer: Self-pay

## 2020-02-20 ENCOUNTER — Telehealth: Payer: Self-pay | Admitting: *Deleted

## 2020-02-20 DIAGNOSIS — I10 Essential (primary) hypertension: Secondary | ICD-10-CM | POA: Insufficient documentation

## 2020-02-20 DIAGNOSIS — Z8543 Personal history of malignant neoplasm of ovary: Secondary | ICD-10-CM | POA: Insufficient documentation

## 2020-02-20 DIAGNOSIS — D6959 Other secondary thrombocytopenia: Secondary | ICD-10-CM | POA: Diagnosis not present

## 2020-02-20 DIAGNOSIS — C9110 Chronic lymphocytic leukemia of B-cell type not having achieved remission: Secondary | ICD-10-CM | POA: Diagnosis not present

## 2020-02-20 DIAGNOSIS — M25531 Pain in right wrist: Secondary | ICD-10-CM | POA: Insufficient documentation

## 2020-02-20 DIAGNOSIS — Z5111 Encounter for antineoplastic chemotherapy: Secondary | ICD-10-CM | POA: Diagnosis not present

## 2020-02-20 DIAGNOSIS — R197 Diarrhea, unspecified: Secondary | ICD-10-CM | POA: Diagnosis not present

## 2020-02-20 DIAGNOSIS — C8318 Mantle cell lymphoma, lymph nodes of multiple sites: Secondary | ICD-10-CM | POA: Diagnosis not present

## 2020-02-20 DIAGNOSIS — Z87891 Personal history of nicotine dependence: Secondary | ICD-10-CM | POA: Insufficient documentation

## 2020-02-20 DIAGNOSIS — Z7982 Long term (current) use of aspirin: Secondary | ICD-10-CM | POA: Insufficient documentation

## 2020-02-20 DIAGNOSIS — Z79899 Other long term (current) drug therapy: Secondary | ICD-10-CM | POA: Diagnosis not present

## 2020-02-20 DIAGNOSIS — M549 Dorsalgia, unspecified: Secondary | ICD-10-CM | POA: Diagnosis not present

## 2020-02-20 DIAGNOSIS — D638 Anemia in other chronic diseases classified elsewhere: Secondary | ICD-10-CM | POA: Diagnosis not present

## 2020-02-20 DIAGNOSIS — R5381 Other malaise: Secondary | ICD-10-CM | POA: Diagnosis not present

## 2020-02-20 DIAGNOSIS — R410 Disorientation, unspecified: Secondary | ICD-10-CM | POA: Insufficient documentation

## 2020-02-20 DIAGNOSIS — G8929 Other chronic pain: Secondary | ICD-10-CM | POA: Diagnosis not present

## 2020-02-20 DIAGNOSIS — D46C Myelodysplastic syndrome with isolated del(5q) chromosomal abnormality: Secondary | ICD-10-CM

## 2020-02-20 DIAGNOSIS — M79641 Pain in right hand: Secondary | ICD-10-CM | POA: Insufficient documentation

## 2020-02-20 DIAGNOSIS — R5383 Other fatigue: Secondary | ICD-10-CM | POA: Insufficient documentation

## 2020-02-20 DIAGNOSIS — Z515 Encounter for palliative care: Secondary | ICD-10-CM | POA: Insufficient documentation

## 2020-02-20 LAB — CBC WITH DIFFERENTIAL/PLATELET
Abs Immature Granulocytes: 1.93 10*3/uL — ABNORMAL HIGH (ref 0.00–0.07)
Basophils Absolute: 0.3 10*3/uL — ABNORMAL HIGH (ref 0.0–0.1)
Basophils Relative: 0 %
Eosinophils Absolute: 0.5 10*3/uL (ref 0.0–0.5)
Eosinophils Relative: 0 %
HCT: 20.2 % — ABNORMAL LOW (ref 36.0–46.0)
Hemoglobin: 6.1 g/dL — ABNORMAL LOW (ref 12.0–15.0)
Immature Granulocytes: 2 %
Lymphocytes Relative: 86 %
Lymphs Abs: 97.8 10*3/uL — ABNORMAL HIGH (ref 0.7–4.0)
MCH: 31.9 pg (ref 26.0–34.0)
MCHC: 30.2 g/dL (ref 30.0–36.0)
MCV: 105.8 fL — ABNORMAL HIGH (ref 80.0–100.0)
Monocytes Absolute: 4.4 10*3/uL — ABNORMAL HIGH (ref 0.1–1.0)
Monocytes Relative: 4 %
Neutro Abs: 8.8 10*3/uL — ABNORMAL HIGH (ref 1.7–7.7)
Neutrophils Relative %: 8 %
Platelets: 65 10*3/uL — ABNORMAL LOW (ref 150–400)
RBC: 1.91 MIL/uL — ABNORMAL LOW (ref 3.87–5.11)
RDW: 25.4 % — ABNORMAL HIGH (ref 11.5–15.5)
Smear Review: NORMAL
WBC Morphology: ABNORMAL
WBC: 113.6 10*3/uL (ref 4.0–10.5)
nRBC: 0.1 % (ref 0.0–0.2)

## 2020-02-20 LAB — SAMPLE TO BLOOD BANK

## 2020-02-20 LAB — PATHOLOGIST SMEAR REVIEW

## 2020-02-20 LAB — LACTATE DEHYDROGENASE: LDH: 242 U/L — ABNORMAL HIGH (ref 98–192)

## 2020-02-20 MED ORDER — ALPRAZOLAM 0.25 MG PO TABS: 0.2500 mg | ORAL_TABLET | Freq: Every day | ORAL | 0 refills | Status: DC | PRN

## 2020-02-20 NOTE — Telephone Encounter (Signed)
Claiborne Billings from Dr Sanjuan Dame office called to report that patient appointment 12/18 may need to bemMoved up due to WBC >100K and platelet count of 50  [place] 02/19/2020 Component  Component 02/19/2020 08/28/2019 02/27/2019 08/28/2018 02/21/2018 08/18/2017            WBC (White Blood Cell Count) 99.9High   8.4 4.9 3.3Low   3.5Low   3.7Low   Load older lab results  RBC (Red Blood Cell Count) 2.18Low   1.88Low   1.87Low   1.71Low   2.27Low   1.87Low   Load older lab results  Hemoglobin 7.1Low   6.9Low   7.1Low   7.0Low   8.8Low   6.8Low   Load older lab results  Hematocrit 23.6Low   21.7Low   21.0Low   20.4Low   25.4Low   20.6Low   Load older lab results  MCV (Mean Corpuscular Volume) 108.3High    115.4High   112.3High    119.3High    111.9High    110.2High   Load older lab results  MCH (Mean Corpuscular Hemoglobin) 32.6High   36.7High   38.0High   40.9High   38.8High   36.4High   Load older lab results  MCHC (Mean Corpuscular Hemoglobin Concentration) 30.1Low   31.8Low   33.8 34.3 34.6 33.0 Load older lab results  RDW-CV (Red Cell Distribution Width) 24.5High   - 18.4High   15.9High   19.6High   24.7High   Load older lab results  MPV (Mean Platelet Volume) 12.2 11.6 11.6 11.2 11.6 11.2 Load older lab results  Immature Granulocyte Count 1.28High   0.04 0.02 0.02      [place] 02/19/2020 Component    Platelet Count  Absolute Retic  Pathology Interpretation  Component 02/19/2020 02/19/2020 08/28/2019 02/27/2019 08/28/2018 02/21/2018 08/18/2017 08/18/2017              Platelet Count 57Low   - 374 287 317 320 384 - Load older lab results  Absolute Retic - - - - - - - - Load older lab results  Pathology Interpretation - See Path review - -

## 2020-02-20 NOTE — Telephone Encounter (Signed)
Patient scheduled for lab only this afternoon, see Sonia Baller with blood/desferal tomorrow. Patient is aware of appointments.

## 2020-02-21 ENCOUNTER — Inpatient Hospital Stay (HOSPITAL_BASED_OUTPATIENT_CLINIC_OR_DEPARTMENT_OTHER): Payer: Medicare Other | Admitting: Oncology

## 2020-02-21 ENCOUNTER — Inpatient Hospital Stay: Payer: Medicare Other

## 2020-02-21 ENCOUNTER — Encounter: Payer: Self-pay | Admitting: Oncology

## 2020-02-21 DIAGNOSIS — I6523 Occlusion and stenosis of bilateral carotid arteries: Secondary | ICD-10-CM

## 2020-02-21 DIAGNOSIS — D46C Myelodysplastic syndrome with isolated del(5q) chromosomal abnormality: Secondary | ICD-10-CM

## 2020-02-21 DIAGNOSIS — Z5111 Encounter for antineoplastic chemotherapy: Secondary | ICD-10-CM | POA: Diagnosis not present

## 2020-02-21 LAB — PREPARE RBC (CROSSMATCH)

## 2020-02-21 MED ORDER — DIAZEPAM 5 MG PO TABS
10.0000 mg | ORAL_TABLET | Freq: Once | ORAL | Status: AC
  Administered 2020-02-21: 10 mg via ORAL
  Filled 2020-02-21: qty 2

## 2020-02-21 MED ORDER — SODIUM CHLORIDE 0.9% IV SOLUTION
250.0000 mL | Freq: Once | INTRAVENOUS | Status: AC
  Administered 2020-02-21: 250 mL via INTRAVENOUS
  Filled 2020-02-21: qty 250

## 2020-02-21 MED ORDER — SODIUM CHLORIDE 0.9% FLUSH
10.0000 mL | INTRAVENOUS | Status: AC | PRN
  Administered 2020-02-21: 10 mL
  Filled 2020-02-21: qty 10

## 2020-02-21 MED ORDER — HEPARIN SOD (PORK) LOCK FLUSH 100 UNIT/ML IV SOLN
INTRAVENOUS | Status: AC
  Filled 2020-02-21: qty 5

## 2020-02-21 MED ORDER — HEPARIN SOD (PORK) LOCK FLUSH 100 UNIT/ML IV SOLN
500.0000 [IU] | Freq: Every day | INTRAVENOUS | Status: AC | PRN
  Administered 2020-02-21: 500 [IU]
  Filled 2020-02-21: qty 5

## 2020-02-21 MED ORDER — DIPHENHYDRAMINE HCL 50 MG/ML IJ SOLN
25.0000 mg | Freq: Once | INTRAMUSCULAR | Status: AC
  Administered 2020-02-21: 25 mg via INTRAVENOUS
  Filled 2020-02-21: qty 1

## 2020-02-21 MED ORDER — ACETAMINOPHEN 325 MG PO TABS
650.0000 mg | ORAL_TABLET | Freq: Once | ORAL | Status: AC
  Administered 2020-02-21: 650 mg via ORAL
  Filled 2020-02-21: qty 2

## 2020-02-21 NOTE — Progress Notes (Signed)
[place]  [phone] [phone]) [phone]  ID: Jacqueline Russo OB: 11-06-1929  MR#: [id number]  AOZ#:[id number]  Patient Care Team: [name], MD as PCP - General (Internal Medicine) Lloyd Huger, MD as Consulting Physician (Hematology and Oncology)  CHIEF COMPLAINT: MDS 5q-, CLL  INTERVAL HISTORY: Jacqueline Russo is an 86 year old female with past medical history significant for MDS for several years treated symptomatically with periodic blood transfusions. Jacqueline Russo was added on today acutely after being seen at her PCPs office for "feeling poorly". Lab work from her PCPs office showed severe anemia along with significantly elevated white counts. Jacqueline Russo was instructed to call oncology and set up an appointment ASAP. Today, Jacqueline Russo continues to feel very fatigued. Jacqueline Russo is concerned about her labs and what they mean. Jacqueline Russo has chronic back pain that is still bothersome but improved. Jacqueline Russo denies any neurological complaints, recent fevers or illness. Denies any night sweats or weight loss. Denies chest pain, shortness of breath, cough or hemoptysis. Denies any nausea, vomiting, constipation or diarrhea. Jacqueline Russo denies any melena or hematochezia. Jacqueline Russo has no urinary complaints.  REVIEW OF SYSTEMS:   Review of Systems  Constitutional: Positive for malaise/fatigue. Negative for fever and weight loss.  Eyes: Negative.  Negative for blurred vision, double vision and pain.  Respiratory: Negative.  Negative for cough and shortness of breath.   Cardiovascular: Negative.  Negative for chest pain and leg swelling.  Gastrointestinal: Negative.  Negative for abdominal pain, blood in stool and melena.  Genitourinary: Negative.  Negative for dysuria and flank pain.  Musculoskeletal: Positive for back pain. Negative for joint pain.  Skin: Negative.  Negative for rash.  Neurological: Positive for weakness. Negative for dizziness, sensory change, focal weakness and headaches.   Endo/Heme/Allergies: Does not bruise/bleed easily.  Psychiatric/Behavioral: Negative.  The patient is not nervous/anxious.     PAST MEDICAL HISTORY: Past Medical History:  Diagnosis Date  . Anemia    Myelodysplastic syndrome  . Arthritis   . Hypertension   . Leukemia, chronic lymphoid (HCC)    CLL  . Ovarian cancer ([place]) 1963    PAST SURGICAL HISTORY: Past Surgical History:  Procedure Laterality Date  . ABDOMINAL HYSTERECTOMY    . APPENDECTOMY    . BREAST SURGERY     cyst removed from right breast  . EYE SURGERY Bilateral    Cataract Extractions  . IR FLUORO GUIDE CV LINE RIGHT  06/30/2017  . REVERSE SHOULDER ARTHROPLASTY Right 10/28/2014   Procedure: REVERSE SHOULDER ARTHROPLASTY;  Surgeon: Corky Mull, MD;  Location: [facility] ORS;  Service: Orthopedics;  Laterality: Right;    FAMILY HISTORY: Reviewed and unchanged. No reported history of malignancy or chronic disease.     ADVANCED DIRECTIVES:    HEALTH MAINTENANCE: Social History   Tobacco Use  . Smoking status: Former Smoker    Packs/day: 2.00    Types: Cigarettes    Quit date: 1963    Years since quitting: 58.9  . Smokeless tobacco: Never Used  Vaping Use  . Vaping Use: Never used  Substance Use Topics  . Alcohol use: Not Currently    Alcohol/week: 1.0 standard drink    Types: 1 Glasses of wine per week    Comment: 1 glass of wine daily  . Drug use: No     Colonoscopy:  PAP:  Bone density:  Lipid panel:  Allergies  Allergen Reactions  . Ciprofloxacin Nausea Only    Current Outpatient Medications  Medication Sig Dispense Refill  . ALPRAZolam (  XANAX) 0.25 MG tablet Take 1 tablet (0.25 mg total) by mouth daily as needed for anxiety. 90 tablet 0  . aspirin 81 MG tablet Take 81 mg by mouth daily.    . bumetanide (BUMEX) 1 MG tablet Take by mouth. (Patient not taking: Reported on 01/02/2020)    . cyanocobalamin (,VITAMIN B-12,) 1000 MCG/ML injection Inject into the muscle.    . diltiazem (TIAZAC) 240  MG 24 hr capsule Take by mouth.    . diphenhydrAMINE (BENADRYL) 50 MG tablet Take 50 mg by mouth at bedtime as needed for itching.    . diphenhydrAMINE (SOMINEX) 25 MG tablet Take 25 mg by mouth at bedtime.    Marland Kitchen etodolac (LODINE) 400 MG tablet Take 400 mg by mouth daily.    . hydrALAZINE (APRESOLINE) 50 MG tablet Take 50 mg by mouth 2 (two) times daily.    Marland Kitchen HYDROcodone-acetaminophen (NORCO/VICODIN) 5-325 MG tablet Take 1-2 tablets by mouth every 4 (four) hours as needed for moderate pain or severe pain. 90 tablet 0  . lidocaine-prilocaine (EMLA) cream Apply 1 application topically as needed. Apply to port 1-2 hours prior to appointment. Cover with plastic wrap. 30 g 3  . magnesium oxide (MAG-OX) 400 (241.3 Mg) MG tablet Take 1 tablet (400 mg total) by mouth daily. (Patient not taking: Reported on 01/02/2020) 30 tablet 0  . mirtazapine (REMERON) 7.5 MG tablet Take 1 tablet (7.5 mg total) by mouth at bedtime. 30 tablet 2  . Multiple Vitamins-Minerals (PRESERVISION AREDS 2+MULTI VIT PO) Take 1 tablet by mouth 1 day or 1 dose.    Marland Kitchen omeprazole (PRILOSEC) 40 MG capsule Take by mouth.    . ondansetron (ZOFRAN) 8 MG tablet Take 1 tablet (8 mg total) by mouth every 8 (eight) hours as needed for nausea or vomiting. 20 tablet 0  . potassium chloride SA (KLOR-CON) 20 MEQ tablet Take 1 tablet (20 mEq total) by mouth daily. (Patient not taking: Reported on 01/02/2020) 10 tablet 0  . tiZANidine (ZANAFLEX) 2 MG tablet Take 1 tablet (2 mg total) by mouth at bedtime as needed for muscle spasms. 30 tablet 0   No current facility-administered medications for this visit.   Facility-Administered Medications Ordered in Other Visits  Medication Dose Route Frequency Provider Last Rate Last Admin  . 0.9 %  sodium chloride infusion (Manually program via Guardrails IV Fluids)  250 mL Intravenous Once Lloyd Huger, MD      . acetaminophen (TYLENOL) tablet 650 mg  650 mg Oral Once Lloyd Huger, MD      .  diazepam (VALIUM) tablet 10 mg  10 mg Oral Once Lloyd Huger, MD      . diphenhydrAMINE (BENADRYL) injection 25 mg  25 mg Intravenous Once Lloyd Huger, MD      . heparin lock flush 100 unit/mL  500 Units Intravenous Once Lloyd Huger, MD      . heparin lock flush 100 unit/mL  500 Units Intracatheter Daily PRN Lloyd Huger, MD      . sodium chloride flush (NS) 0.9 % injection 10 mL  10 mL Intravenous PRN Lloyd Huger, MD   10 mL at 09/05/18 6962  . sodium chloride flush (NS) 0.9 % injection 10 mL  10 mL Intracatheter PRN Lloyd Huger, MD        OBJECTIVE: There were no vitals filed for this visit.   There is no height or weight on file to calculate BMI.    ECOG  FS:1 - Symptomatic but completely ambulatory  Physical Exam Constitutional:      Appearance: Normal appearance.  HENT:     Head: Normocephalic and atraumatic.  Eyes:     Pupils: Pupils are equal, round, and reactive to light.  Cardiovascular:     Rate and Rhythm: Normal rate and regular rhythm.     Heart sounds: Normal heart sounds. No murmur heard.   Pulmonary:     Effort: Pulmonary effort is normal.     Breath sounds: Normal breath sounds. No wheezing.  Abdominal:     General: Bowel sounds are normal. There is no distension.     Palpations: Abdomen is soft.     Tenderness: There is no abdominal tenderness.  Musculoskeletal:        General: Normal range of motion.     Cervical back: Normal range of motion.  Skin:    General: Skin is warm and dry.     Findings: No rash.  Neurological:     Mental Status: Jacqueline Russo is alert and oriented to person, place, and time.  Psychiatric:        Judgment: Judgment normal.      LAB RESULTS:  Lab Results  Component Value Date   NA 140 08/22/2019   K 3.5 08/22/2019   CL 105 08/22/2019   CO2 25 08/22/2019   GLUCOSE 136 (H) 08/22/2019   BUN 23 08/22/2019   CREATININE 0.94 08/22/2019   CALCIUM 9.4 08/22/2019   PROT 6.4 (L) 08/22/2019    ALBUMIN 3.6 08/22/2019   AST 13 (L) 08/22/2019   ALT 15 08/22/2019   ALKPHOS 64 08/22/2019   BILITOT 0.6 08/22/2019   GFRNONAA 55 (L) 08/22/2019   GFRAA >60 08/22/2019    Lab Results  Component Value Date   WBC 113.6 (HH) 02/20/2020   NEUTROABS 8.8 (H) 02/20/2020   HGB 6.1 (L) 02/20/2020   HCT 20.2 (L) 02/20/2020   MCV 105.8 (H) 02/20/2020   PLT 65 (L) 02/20/2020   Lab Results  Component Value Date   IRON 153 01/29/2020   TIBC 290 01/29/2020   IRONPCTSAT 53 (H) 01/29/2020   Lab Results  Component Value Date   FERRITIN 1,038 (H) 01/29/2020     STUDIES: No results found.  ASSESSMENT: MDS 5q-, CLL  Oncology history: MDS, 5q-: Bone marrow biopsy results from June 30, 2017 reviewed independently confirming progression of patient's MDS, 5q-. Jacqueline Russo has a hypercellular marrow for age as well as an increased blast count up to 6%.  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  Patient continues to decline treatment with Revlimid.  Previously, we also discussed the possibility of progressing to AML, but patient wishes no further interventions other than periodic blood transfusions. Jacqueline Russo also has declined any further bone marrow biopsies.  CT scan results from December 24, 2019 reviewed independently with worsening splenomegaly that is also likely related to her underlying MDS.  PLAN:    1. MDS, 5 q. deletion- -Has received periodic blood transfusions since 2018.  -Most recent imaging with CT scan on 12/24/2019 showed worsening splenomegaly. -Last blood transfusion was given on 01/30/2020. -Jacqueline Russo was seen acutely at PCP's office yesterday for fatigue and decline in appetite.  Lab work showed decline in her hemoglobin and elevation in her white blood cell count. -Lab work from 02/21/2020 shows a white count of 113.6 (27.2), hemoglobin of 6.1 (6.8) and platelet count 65,000 (109,000).  Absolute lymphocytosis. -Flow cytometry pending.  -Jacqueline Russo will receive 1  unit of packed red blood cells today instead of 2 d/t an appointment Jacqueline Russo has this afternoon. -Jacqueline Russo is agreeable for a stat bone marrow biopsy next week. 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 -Previously Jacqueline Russo has declined treatment options and preferred symptomatic treatment with blood transfusions. -Jacqueline Russo is now open to other options and wishes to discuss these with Dr. Grayland Ormond next week.  2.  Hypertension: -BP elevated 184/93 -Mild tachycardia at 103 likely secondary to anxiety. -Continue hydralazine and diltiazem  Disposition: -RTC next week for stat bone marrow biopsy and repeat labs.    Greater than 50% was spent in counseling and coordination of care with this patient including but not limited to discussion of the relevant topics above (See A&P) including, but not limited to diagnosis and management of acute and chronic medical conditions.   Patient expressed understanding and was in agreement with this plan. Jacqueline Russo also understands that Jacqueline Russo can call clinic at any time with any questions, concerns, or complaints.   Jacquelin Hawking, NP   02/21/2020 9:01 AM

## 2020-02-21 NOTE — Progress Notes (Signed)
Patient here today for follow up regarding MDS, add on for follow up visit and blood transfusion. Patient to be seen by provider in infusion chair.

## 2020-02-21 NOTE — Progress Notes (Signed)
Per MD and NP pt is only receiving one unit of RBCs today with no desferal prior to transfusion. All pre-medications given prior to transfusion as ordered. Pt updated and all questions answered at this time. VSS throughout transfusion. MD aware of BP and per order to proceed with blood transfusion. Pt stable for discharge. Pt has no complaints at this time. Pt discharged home.   Jacqueline Russo [employer]

## 2020-02-22 LAB — BPAM RBC
Blood Product Expiration Date: 202112072359
Blood Product Expiration Date: 202112282359
ISSUE DATE / TIME: 202112030945
Unit Type and Rh: 6200
Unit Type and Rh: 6200

## 2020-02-22 LAB — TYPE AND SCREEN
ABO/RH(D): AB POS
Antibody Screen: NEGATIVE
Unit division: 0
Unit division: 0

## 2020-02-24 LAB — COMP PANEL: LEUKEMIA/LYMPHOMA: Immunophenotypic Profile: 84

## 2020-02-25 ENCOUNTER — Other Ambulatory Visit: Payer: Self-pay | Admitting: Radiology

## 2020-02-26 ENCOUNTER — Other Ambulatory Visit: Payer: Self-pay

## 2020-02-26 ENCOUNTER — Other Ambulatory Visit: Payer: Self-pay | Admitting: *Deleted

## 2020-02-26 ENCOUNTER — Ambulatory Visit
Admission: RE | Admit: 2020-02-26 | Discharge: 2020-02-26 | Disposition: A | Discharge status: Home / Self Care | Payer: Medicare Other | Source: Ambulatory Visit | Attending: Oncology | Admitting: Oncology

## 2020-02-26 ENCOUNTER — Inpatient Hospital Stay: Payer: Medicare Other

## 2020-02-26 ENCOUNTER — Telehealth: Payer: Self-pay | Admitting: *Deleted

## 2020-02-26 DIAGNOSIS — Z96611 Presence of right artificial shoulder joint: Secondary | ICD-10-CM | POA: Insufficient documentation

## 2020-02-26 DIAGNOSIS — Z8543 Personal history of malignant neoplasm of ovary: Secondary | ICD-10-CM | POA: Diagnosis not present

## 2020-02-26 DIAGNOSIS — I10 Essential (primary) hypertension: Secondary | ICD-10-CM | POA: Insufficient documentation

## 2020-02-26 DIAGNOSIS — C9110 Chronic lymphocytic leukemia of B-cell type not having achieved remission: Secondary | ICD-10-CM | POA: Diagnosis not present

## 2020-02-26 DIAGNOSIS — Z90710 Acquired absence of both cervix and uterus: Secondary | ICD-10-CM | POA: Insufficient documentation

## 2020-02-26 DIAGNOSIS — Z79899 Other long term (current) drug therapy: Secondary | ICD-10-CM | POA: Insufficient documentation

## 2020-02-26 DIAGNOSIS — D46C Myelodysplastic syndrome with isolated del(5q) chromosomal abnormality: Secondary | ICD-10-CM | POA: Diagnosis not present

## 2020-02-26 DIAGNOSIS — Z87891 Personal history of nicotine dependence: Secondary | ICD-10-CM | POA: Diagnosis not present

## 2020-02-26 DIAGNOSIS — Z7982 Long term (current) use of aspirin: Secondary | ICD-10-CM | POA: Diagnosis not present

## 2020-02-26 DIAGNOSIS — Z5111 Encounter for antineoplastic chemotherapy: Secondary | ICD-10-CM | POA: Diagnosis not present

## 2020-02-26 DIAGNOSIS — D469 Myelodysplastic syndrome, unspecified: Secondary | ICD-10-CM | POA: Diagnosis present

## 2020-02-26 LAB — IRON AND TIBC
Iron: 269 ug/dL — ABNORMAL HIGH (ref 28–170)
Saturation Ratios: 88 % — ABNORMAL HIGH (ref 10.4–31.8)
TIBC: 307 ug/dL (ref 250–450)
UIBC: 38 ug/dL

## 2020-02-26 LAB — CBC WITH DIFFERENTIAL/PLATELET
Abs Immature Granulocytes: 1.21 10*3/uL — ABNORMAL HIGH (ref 0.00–0.07)
Basophils Absolute: 0.4 10*3/uL — ABNORMAL HIGH (ref 0.0–0.1)
Basophils Relative: 0 %
Eosinophils Absolute: 0.5 10*3/uL (ref 0.0–0.5)
Eosinophils Relative: 0 %
HCT: 23.6 % — ABNORMAL LOW (ref 36.0–46.0)
Hemoglobin: 7.4 g/dL — ABNORMAL LOW (ref 12.0–15.0)
Immature Granulocytes: 1 %
Lymphocytes Relative: 91 %
Lymphs Abs: 136.6 10*3/uL — ABNORMAL HIGH (ref 0.7–4.0)
MCH: 31.6 pg (ref 26.0–34.0)
MCHC: 31.4 g/dL (ref 30.0–36.0)
MCV: 100.9 fL — ABNORMAL HIGH (ref 80.0–100.0)
Monocytes Absolute: 4 10*3/uL — ABNORMAL HIGH (ref 0.1–1.0)
Monocytes Relative: 3 %
Neutro Abs: 7 10*3/uL (ref 1.7–7.7)
Neutrophils Relative %: 5 %
Platelets: 39 10*3/uL — ABNORMAL LOW (ref 150–400)
RBC: 2.34 MIL/uL — ABNORMAL LOW (ref 3.87–5.11)
RDW: 23.6 % — ABNORMAL HIGH (ref 11.5–15.5)
WBC Morphology: ABNORMAL
WBC: 149.6 10*3/uL (ref 4.0–10.5)
nRBC: 0.1 % (ref 0.0–0.2)

## 2020-02-26 LAB — FERRITIN: Ferritin: 2050 ng/mL — ABNORMAL HIGH (ref 11–307)

## 2020-02-26 LAB — SAMPLE TO BLOOD BANK

## 2020-02-26 MED ORDER — FENTANYL CITRATE (PF) 100 MCG/2ML IJ SOLN
INTRAMUSCULAR | Status: AC | PRN
Start: 2020-02-26 — End: 2020-02-26
  Administered 2020-02-26 (×2): 25 ug via INTRAVENOUS

## 2020-02-26 MED ORDER — HEPARIN SOD (PORK) LOCK FLUSH 100 UNIT/ML IV SOLN
INTRAVENOUS | Status: AC
  Filled 2020-02-26: qty 5

## 2020-02-26 MED ORDER — SODIUM CHLORIDE 0.9 % IV SOLN: INTRAVENOUS | Status: DC

## 2020-02-26 MED ORDER — MIDAZOLAM HCL 5 MG/5ML IJ SOLN
INTRAMUSCULAR | Status: AC | PRN
  Administered 2020-02-26 (×2): 0.5 mg via INTRAVENOUS

## 2020-02-26 MED ORDER — MIDAZOLAM HCL 5 MG/5ML IJ SOLN
INTRAMUSCULAR | Status: AC
  Filled 2020-02-26: qty 5

## 2020-02-26 MED ORDER — FENTANYL CITRATE (PF) 100 MCG/2ML IJ SOLN
INTRAMUSCULAR | Status: AC
  Filled 2020-02-26: qty 2

## 2020-02-26 MED ORDER — ALLOPURINOL 300 MG PO TABS: 300.0000 mg | ORAL_TABLET | Freq: Every day | ORAL | 0 refills | Status: DC

## 2020-02-26 MED ORDER — HYDROXYUREA 500 MG PO CAPS: 500.0000 mg | ORAL_CAPSULE | Freq: Every day | ORAL | 0 refills | Status: DC

## 2020-02-26 NOTE — Consult Note (Signed)
Chief Complaint: MDS  Referring Physician(s): Finnegan,Timothy J  Patient Status: [facility] - Out-pt  History of Present Illness: Jacqueline Russo is a 86 y.o. female with past medical history significant for hypertension, ovarian cancer, CLL and myelodysplastic syndrome who presents today for CT-guided bone marrow biopsy for tissue diagnostic purposes.  Patient states that her energy level has remained poor but is otherwise without acute complaint.  Specifically, no chest pain, shortness of breath, fever or chills.  Past Medical History:  Diagnosis Date  . Anemia    Myelodysplastic syndrome  . Arthritis   . Hypertension   . Leukemia, chronic lymphoid (HCC)    CLL  . Ovarian cancer ([place]) 1963    Past Surgical History:  Procedure Laterality Date  . ABDOMINAL HYSTERECTOMY    . APPENDECTOMY    . BREAST SURGERY     cyst removed from right breast  . EYE SURGERY Bilateral    Cataract Extractions  . IR FLUORO GUIDE CV LINE RIGHT  06/30/2017  . REVERSE SHOULDER ARTHROPLASTY Right 10/28/2014   Procedure: REVERSE SHOULDER ARTHROPLASTY;  Surgeon: Corky Mull, MD;  Location: [facility] ORS;  Service: Orthopedics;  Laterality: Right;    Allergies: Ciprofloxacin  Medications: Prior to Admission medications   Medication Sig Start Date End Date Taking? Authorizing Provider  ALPRAZolam (XANAX) 0.25 MG tablet Take 1 tablet (0.25 mg total) by mouth daily as needed for anxiety. 02/20/20  Yes Lloyd Huger, MD  aspirin 81 MG tablet Take 81 mg by mouth daily.   Yes [provider]  diltiazem (TIAZAC) 240 MG 24 hr capsule Take by mouth. 12/10/19 12/09/20 Yes [provider]  etodolac (LODINE) 400 MG tablet Take 400 mg by mouth daily. 06/04/19 06/03/20 Yes [provider]  hydrALAZINE (APRESOLINE) 50 MG tablet Take 50 mg by mouth 2 (two) times daily. 12/20/19  Yes [provider]  HYDROcodone-acetaminophen (NORCO/VICODIN) 5-325 MG tablet Take 1-2 tablets by mouth  every 4 (four) hours as needed for moderate pain or severe pain. 01/31/20  Yes Lloyd Huger, MD  mirtazapine (REMERON) 7.5 MG tablet Take 1 tablet (7.5 mg total) by mouth at bedtime. 05/23/19  Yes Borders, Kirt Boys, NP  Multiple Vitamins-Minerals (PRESERVISION AREDS 2+MULTI VIT PO) Take 1 tablet by mouth 1 day or 1 dose.   Yes [provider]  omeprazole (PRILOSEC) 40 MG capsule Take by mouth. 09/04/19 09/03/20 Yes [provider]  tiZANidine (ZANAFLEX) 2 MG tablet Take 1 tablet (2 mg total) by mouth at bedtime as needed for muscle spasms. 12/29/16  Yes Burns, Wandra Feinstein, NP  bumetanide (BUMEX) 1 MG tablet Take by mouth. Patient not taking: Reported on 01/02/2020 10/02/19 10/01/20  [provider]  cyanocobalamin (,VITAMIN B-12,) 1000 MCG/ML injection Inject into the muscle. 03/09/16   [provider]  diphenhydrAMINE (BENADRYL) 50 MG tablet Take 50 mg by mouth at bedtime as needed for itching. Patient not taking: Reported on 02/26/2020    [provider]  diphenhydrAMINE (SOMINEX) 25 MG tablet Take 25 mg by mouth at bedtime. Patient not taking: Reported on 02/26/2020    [provider]  lidocaine-prilocaine (EMLA) cream Apply 1 application topically as needed. Apply to port 1-2 hours prior to appointment. Cover with plastic wrap. 07/18/19   Lloyd Huger, MD  magnesium oxide (MAG-OX) 400 (241.3 Mg) MG tablet Take 1 tablet (400 mg total) by mouth daily. Patient not taking: Reported on 01/02/2020 05/23/19   Borders, Kirt Boys, NP  ondansetron (ZOFRAN) 8 MG  tablet Take 1 tablet (8 mg total) by mouth every 8 (eight) hours as needed for nausea or vomiting. 05/23/19   Borders, Kirt Boys, NP  potassium chloride SA (KLOR-CON) 20 MEQ tablet Take 1 tablet (20 mEq total) by mouth daily. Patient not taking: Reported on 01/02/2020 05/23/19   Borders, Kirt Boys, NP     History reviewed. No pertinent family history.  Social History   Socioeconomic History   . Marital status: Widowed    Spouse name: Not on file  . Number of children: Not on file  . Years of education: Not on file  . Highest education level: Not on file  Occupational History  . Not on file  Tobacco Use  . Smoking status: Former Smoker    Packs/day: 2.00    Types: Cigarettes    Quit date: 1963    Years since quitting: 58.9  . Smokeless tobacco: Never Used  Vaping Use  . Vaping Use: Never used  Substance and Sexual Activity  . Alcohol use: Not Currently    Alcohol/week: 1.0 standard drink    Types: 1 Glasses of wine per week    Comment: 1 glass of wine daily  . Drug use: No  . Sexual activity: Not on file  Other Topics Concern  . Not on file  Social History Narrative  . Not on file   Social Determinants of Health   Financial Resource Strain:   . Difficulty of Paying Living Expenses: Not on file  Food Insecurity:   . Worried About Charity fundraiser in the Last Year: Not on file  . Ran Out of Food in the Last Year: Not on file  Transportation Needs:   . Lack of Transportation (Medical): Not on file  . Lack of Transportation (Non-Medical): Not on file  Physical Activity:   . Days of Exercise per Week: Not on file  . Minutes of Exercise per Session: Not on file  Stress:   . Feeling of Stress : Not on file  Social Connections:   . Frequency of Communication with Friends and Family: Not on file  . Frequency of Social Gatherings with Friends and Family: Not on file  . Attends Religious Services: Not on file  . Active Member of Clubs or Organizations: Not on file  . Attends Archivist Meetings: Not on file  . Marital Status: Not on file    ECOG Status: 1 - Symptomatic but completely ambulatory  Review of Systems: A 12 point ROS discussed and pertinent positives are indicated in the HPI above.  All other systems are negative.  Review of Systems  Vital Signs: BP (!) 139/56   Pulse 72   Temp 97.9 F (36.6 C) (Oral)   Resp 16   Ht 5' (1.524  m)   Wt 45.8 kg   SpO2 94%   BMI 19.73 kg/m   Physical Exam  Imaging: No results found.  Labs:  CBC: Recent Labs    01/01/20 1355 01/29/20 1409 02/20/20 1532 02/26/20 0800  WBC 11.4* 27.2* 113.6* 149.6*  HGB 9.5* 6.8* 6.1* 7.4*  HCT 27.7* 20.2* 20.2* 23.6*  PLT 168 109* 65* 39*    COAGS: No results for input(s): INR, APTT in the last 8760 hours.  BMP: Recent Labs    05/16/19 0954 05/23/19 0940 06/06/19 0909 08/22/19 0900  NA 137 137 138 140  K 3.1* 2.9* 4.1 3.5  CL 102 104 107 105  CO2 21* 21* 22 25  GLUCOSE 163* 125*  140* 136*  BUN _0 CALCIUM 9.1 8.7* 9.3 9.4  CREATININE 0.65 0.62 0.74 0.94  GFRNONAA >60 >60 >60 55*  GFRAA >60 >60 >60 >60    LIVER FUNCTION TESTS: Recent Labs    05/16/19 0954 06/06/19 0909 08/22/19 0900  BILITOT 0.7 0.6 0.6  AST 20 23 13*  ALT _1 ALKPHOS 78 78 64  PROT 6.9 6.5 6.4*  ALBUMIN 4.2 4.4 3.6    TUMOR MARKERS: No results for input(s): AFPTM, CEA, CA199, CHROMGRNA in the last 8760 hours.  Assessment and Plan:  Jacqueline Russo is a 86 y.o. female with past medical history significant for hypertension, ovarian cancer, CLL and myelodysplastic syndrome who presents today for CT-guided bone marrow biopsy for tissue diagnostic purposes.  Patient states that her energy level has remained poor but is otherwise without acute complaint.  Specifically, no chest pain, shortness of breath, fever or chills.  Risks and benefits of CT guided BM Bx was discussed with the patient and/or patient's family including, but not limited to bleeding, infection, damage to adjacent structures or low yield requiring additional tests.  All of the questions were answered and there is agreement to proceed.  Consent signed and in chart.    Thank you for this interesting consult.  I greatly enjoyed meeting Jacqueline Russo and look forward to participating in their care.  A copy of this report was sent to the requesting provider on this  date.  Electronically Signed: Sandi Mariscal, MD 02/26/2020, 10:41 AM   I spent a total of 15 Minutes in face to face in clinical consultation, greater than 50% of which was counseling/coordinating care for CT guided BM Bx

## 2020-02-26 NOTE — Telephone Encounter (Signed)
Patient called for clarification of her schedule on 02/27/20. Writer informed her that she had a 9:00 AM infusion appt and follow up with Dr. Grayland Ormond at 10:15.

## 2020-02-26 NOTE — Procedures (Signed)
Pre-procedure Diagnosis: MDS Post-procedure Diagnosis: Same  Technically successful CT guided bone marrow aspiration and biopsy of left iliac crest.   Complications: None Immediate  EBL: None  Signed: Sandi Mariscal Pager: [phone] 02/26/2020, 10:43 AM

## 2020-02-27 ENCOUNTER — Other Ambulatory Visit: Payer: Self-pay | Admitting: Oncology

## 2020-02-27 ENCOUNTER — Inpatient Hospital Stay (HOSPITAL_BASED_OUTPATIENT_CLINIC_OR_DEPARTMENT_OTHER): Payer: Medicare Other | Admitting: Oncology

## 2020-02-27 ENCOUNTER — Telehealth: Payer: Self-pay | Admitting: Pharmacy Technician

## 2020-02-27 ENCOUNTER — Encounter: Payer: Self-pay | Admitting: Pharmacist

## 2020-02-27 ENCOUNTER — Inpatient Hospital Stay: Payer: Medicare Other

## 2020-02-27 DIAGNOSIS — C9200 Acute myeloblastic leukemia, not having achieved remission: Secondary | ICD-10-CM | POA: Diagnosis not present

## 2020-02-27 DIAGNOSIS — D46C Myelodysplastic syndrome with isolated del(5q) chromosomal abnormality: Secondary | ICD-10-CM

## 2020-02-27 DIAGNOSIS — I6523 Occlusion and stenosis of bilateral carotid arteries: Secondary | ICD-10-CM

## 2020-02-27 DIAGNOSIS — Z5111 Encounter for antineoplastic chemotherapy: Secondary | ICD-10-CM | POA: Diagnosis not present

## 2020-02-27 LAB — PREPARE RBC (CROSSMATCH)

## 2020-02-27 MED ORDER — SODIUM CHLORIDE 0.9% IV SOLUTION
250.0000 mL | Freq: Once | INTRAVENOUS | Status: AC
  Administered 2020-02-27: 250 mL via INTRAVENOUS
  Filled 2020-02-27: qty 250

## 2020-02-27 MED ORDER — HEPARIN SOD (PORK) LOCK FLUSH 100 UNIT/ML IV SOLN
INTRAVENOUS | Status: AC
  Filled 2020-02-27: qty 5

## 2020-02-27 MED ORDER — HEPARIN SOD (PORK) LOCK FLUSH 100 UNIT/ML IV SOLN
500.0000 [IU] | Freq: Once | INTRAVENOUS | Status: AC
  Administered 2020-02-27: 500 [IU] via INTRAVENOUS
  Filled 2020-02-27: qty 5

## 2020-02-27 MED ORDER — DIPHENHYDRAMINE HCL 50 MG/ML IJ SOLN
25.0000 mg | Freq: Once | INTRAMUSCULAR | Status: AC
  Administered 2020-02-27: 25 mg via INTRAVENOUS
  Filled 2020-02-27: qty 1

## 2020-02-27 MED ORDER — DIAZEPAM 5 MG PO TABS
10.0000 mg | ORAL_TABLET | Freq: Once | ORAL | Status: AC
  Administered 2020-02-27: 10 mg via ORAL
  Filled 2020-02-27: qty 2

## 2020-02-27 MED ORDER — DIPHENHYDRAMINE HCL 50 MG/ML IJ SOLN: 25.0000 mg | Freq: Once | INTRAMUSCULAR | Status: DC

## 2020-02-27 MED ORDER — SODIUM CHLORIDE 0.9 % IV SOLN
15.0000 mg/kg/h | Freq: Once | INTRAVENOUS | Status: AC
  Administered 2020-02-27: 15 mg/kg/h via INTRAVENOUS
  Filled 2020-02-27: qty 2

## 2020-02-27 MED ORDER — ACETAMINOPHEN 325 MG PO TABS
650.0000 mg | ORAL_TABLET | Freq: Once | ORAL | Status: AC
  Administered 2020-02-27: 650 mg via ORAL
  Filled 2020-02-27: qty 2

## 2020-02-27 MED ORDER — DIAZEPAM 5 MG PO TABS: 10.0000 mg | ORAL_TABLET | Freq: Once | ORAL | Status: DC

## 2020-02-27 MED ORDER — ACETAMINOPHEN 325 MG PO TABS: 650.0000 mg | ORAL_TABLET | Freq: Once | ORAL | Status: DC

## 2020-02-27 NOTE — Progress Notes (Signed)
02/26/2020 labs reviewed with MD. Per to continue with blood transfusion today. Pt tolerated desferal infusion and blood transfusion well with no signs of complications. VSS. Pt stable for discharge. Pt discharged home.  Steed Kanaan [employer]

## 2020-02-27 NOTE — Telephone Encounter (Addendum)
Erroneous Encounter

## 2020-02-28 ENCOUNTER — Telehealth: Payer: Self-pay | Admitting: Pharmacy Technician

## 2020-02-28 DIAGNOSIS — C9200 Acute myeloblastic leukemia, not having achieved remission: Secondary | ICD-10-CM | POA: Insufficient documentation

## 2020-02-28 LAB — TYPE AND SCREEN
ABO/RH(D): AB POS
Antibody Screen: NEGATIVE
Unit division: 0

## 2020-02-28 LAB — SURGICAL PATHOLOGY

## 2020-02-28 LAB — BPAM RBC
Blood Product Expiration Date: 202112172359
ISSUE DATE / TIME: 202112091238
Unit Type and Rh: 600

## 2020-02-28 NOTE — Telephone Encounter (Signed)
Oral Oncology Patient Advocate Encounter  Received notification from [employer] that prior authorization for Venclexta is required.  PA submitted on CoverMyMeds Key [id number]  Status is pending  Oral Oncology Clinic will continue to follow.  [place] Patient [place] Phone [phone] Fax [phone] 02/28/2020 2:02 PM

## 2020-02-28 NOTE — Telephone Encounter (Signed)
Oral Oncology Patient Advocate Encounter  Prior Authorization for Lynita Lombard has been approved.    PA# [id number] Effective dates: 01/29/20 through 02/27/2023  Patients co-pay is $24.30.  Oral Oncology Clinic will continue to follow.   [place] Patient [place] Phone [phone] Fax [phone] 02/28/2020 2:03 PM

## 2020-02-28 NOTE — Progress Notes (Signed)
[place]  [phone] Fax:(336) [phone]  ID: Jacqueline Russo OB: 29-Dec-1929  MR#: [id number]  MGQ#:[id number]  Patient Care Team: [name], MD as PCP - General (Internal Medicine) Lloyd Huger, MD as Consulting Physician (Hematology and Oncology)  CHIEF COMPLAINT: AML  INTERVAL HISTORY: Patient returns to clinic today for further evaluation, discussion of her diagnosis, and additional blood transfusion.  She continues to have chronic weakness and fatigue.  She does not complain of back pain today. She has no neurologic complaints. She denies any recent fevers or illnesses. She denies any night sweats or weight loss.  She denies any chest pain, shortness of breath, cough, or hemoptysis.  She denies any nausea, vomiting, constipation, or diarrhea. She denies any melena or hematochezia. She has no urinary complaints.  Patient offers no further specific complaints today.    REVIEW OF SYSTEMS:   Review of Systems  Constitutional: Positive for malaise/fatigue. Negative for fever and weight loss.  Eyes: Negative.  Negative for blurred vision, double vision and pain.  Respiratory: Negative.  Negative for cough and shortness of breath.   Cardiovascular: Negative.  Negative for chest pain and leg swelling.  Gastrointestinal: Negative.  Negative for abdominal pain, blood in stool and melena.  Genitourinary: Negative.  Negative for dysuria and flank pain.  Musculoskeletal: Negative.  Negative for back pain and joint pain.  Skin: Negative.  Negative for rash.  Neurological: Positive for weakness. Negative for dizziness, sensory change, focal weakness and headaches.  Endo/Heme/Allergies: Does not bruise/bleed easily.  Psychiatric/Behavioral: Negative.  The patient is not nervous/anxious.     As per HPI. Otherwise, a complete review of systems is negative.  PAST MEDICAL HISTORY: Past Medical History:  Diagnosis Date  . Anemia    Myelodysplastic  syndrome  . Arthritis   . Hypertension   . Leukemia, chronic lymphoid (HCC)    CLL  . Ovarian cancer ([place]) 1963    PAST SURGICAL HISTORY: Past Surgical History:  Procedure Laterality Date  . ABDOMINAL HYSTERECTOMY    . APPENDECTOMY    . BREAST SURGERY     cyst removed from right breast  . EYE SURGERY Bilateral    Cataract Extractions  . IR FLUORO GUIDE CV LINE RIGHT  06/30/2017  . REVERSE SHOULDER ARTHROPLASTY Right 10/28/2014   Procedure: REVERSE SHOULDER ARTHROPLASTY;  Surgeon: Corky Mull, MD;  Location: [facility] ORS;  Service: Orthopedics;  Laterality: Right;    FAMILY HISTORY: Reviewed and unchanged. No reported history of malignancy or chronic disease.     ADVANCED DIRECTIVES:    HEALTH MAINTENANCE: Social History   Tobacco Use  . Smoking status: Former Smoker    Packs/day: 2.00    Types: Cigarettes    Quit date: 1963    Years since quitting: 58.9  . Smokeless tobacco: Never Used  Vaping Use  . Vaping Use: Never used  Substance Use Topics  . Alcohol use: Not Currently    Alcohol/week: 1.0 standard drink    Types: 1 Glasses of wine per week    Comment: 1 glass of wine daily  . Drug use: No     Colonoscopy:  PAP:  Bone density:  Lipid panel:  Allergies  Allergen Reactions  . Ciprofloxacin Nausea Only    Current Outpatient Medications  Medication Sig Dispense Refill  . allopurinol (ZYLOPRIM) 300 MG tablet Take 1 tablet (300 mg total) by mouth daily. 30 tablet 0  . ALPRAZolam (XANAX) 0.25 MG tablet Take 1 tablet (0.25 mg total)  by mouth daily as needed for anxiety. 90 tablet 0  . aspirin 81 MG tablet Take 81 mg by mouth daily.    . bumetanide (BUMEX) 1 MG tablet Take by mouth. (Patient not taking: Reported on 01/02/2020)    . cyanocobalamin (,VITAMIN B-12,) 1000 MCG/ML injection Inject into the muscle.    . diltiazem (TIAZAC) 240 MG 24 hr capsule Take by mouth.    . diphenhydrAMINE (BENADRYL) 50 MG tablet Take 50 mg by mouth at bedtime as needed for  itching. (Patient not taking: Reported on 02/26/2020)    . diphenhydrAMINE (SOMINEX) 25 MG tablet Take 25 mg by mouth at bedtime. (Patient not taking: Reported on 02/26/2020)    . etodolac (LODINE) 400 MG tablet Take 400 mg by mouth daily.    . hydrALAZINE (APRESOLINE) 50 MG tablet Take 50 mg by mouth 2 (two) times daily.    Marland Kitchen HYDROcodone-acetaminophen (NORCO/VICODIN) 5-325 MG tablet Take 1-2 tablets by mouth every 4 (four) hours as needed for moderate pain or severe pain. 90 tablet 0  . hydroxyurea (HYDREA) 500 MG capsule Take 1 capsule (500 mg total) by mouth daily. May take with food to minimize GI side effects. 30 capsule 0  . lidocaine-prilocaine (EMLA) cream Apply 1 application topically as needed. Apply to port 1-2 hours prior to appointment. Cover with plastic wrap. 30 g 3  . magnesium oxide (MAG-OX) 400 (241.3 Mg) MG tablet Take 1 tablet (400 mg total) by mouth daily. (Patient not taking: Reported on 01/02/2020) 30 tablet 0  . mirtazapine (REMERON) 7.5 MG tablet Take 1 tablet (7.5 mg total) by mouth at bedtime. 30 tablet 2  . Multiple Vitamins-Minerals (PRESERVISION AREDS 2+MULTI VIT PO) Take 1 tablet by mouth 1 day or 1 dose.    Marland Kitchen omeprazole (PRILOSEC) 40 MG capsule Take by mouth.    . ondansetron (ZOFRAN) 8 MG tablet Take 1 tablet (8 mg total) by mouth every 8 (eight) hours as needed for nausea or vomiting. 20 tablet 0  . potassium chloride SA (KLOR-CON) 20 MEQ tablet Take 1 tablet (20 mEq total) by mouth daily. (Patient not taking: Reported on 01/02/2020) 10 tablet 0  . tiZANidine (ZANAFLEX) 2 MG tablet Take 1 tablet (2 mg total) by mouth at bedtime as needed for muscle spasms. 30 tablet 0   No current facility-administered medications for this visit.   Facility-Administered Medications Ordered in Other Visits  Medication Dose Route Frequency Provider Last Rate Last Admin  . heparin lock flush 100 unit/mL  500 Units Intravenous Once Lloyd Huger, MD      . sodium chloride flush  (NS) 0.9 % injection 10 mL  10 mL Intravenous PRN Lloyd Huger, MD   10 mL at 09/05/18 9628    OBJECTIVE: There were no vitals filed for this visit.   There is no height or weight on file to calculate BMI.    ECOG FS:1 - Symptomatic but completely ambulatory  General: Thin, no acute distress. Eyes: Pink conjunctiva, anicteric sclera. HEENT: Normocephalic, moist mucous membranes. Lungs: No audible wheezing or coughing. Heart: Regular rate and rhythm. Abdomen: Soft, nontender, no obvious distention. Musculoskeletal: No edema, cyanosis, or clubbing. Neuro: Alert, answering all questions appropriately. Cranial nerves grossly intact. Skin: No rashes or petechiae noted. Psych: Normal affect.   LAB RESULTS:  Lab Results  Component Value Date   NA 140 08/22/2019   K 3.5 08/22/2019   CL 105 08/22/2019   CO2 25 08/22/2019   GLUCOSE 136 (H) 08/22/2019  BUN 23 08/22/2019   CREATININE 0.94 08/22/2019   CALCIUM 9.4 08/22/2019   PROT 6.4 (L) 08/22/2019   ALBUMIN 3.6 08/22/2019   AST 13 (L) 08/22/2019   ALT 15 08/22/2019   ALKPHOS 64 08/22/2019   BILITOT 0.6 08/22/2019   GFRNONAA 55 (L) 08/22/2019   GFRAA >60 08/22/2019    Lab Results  Component Value Date   WBC 149.6 (HH) 02/26/2020   NEUTROABS 7.0 02/26/2020   HGB 7.4 (L) 02/26/2020   HCT 23.6 (L) 02/26/2020   MCV 100.9 (H) 02/26/2020   PLT 39 (L) 02/26/2020   Lab Results  Component Value Date   IRON 269 (H) 02/26/2020   TIBC 307 02/26/2020   IRONPCTSAT 88 (H) 02/26/2020   Lab Results  Component Value Date   FERRITIN 2,050 (H) 02/26/2020     STUDIES: CT BONE MARROW BIOPSY & ASPIRATION  Result Date: 02/26/2020 INDICATION: History of MDS now with worsening laboratories. Please perform CT-guided bone marrow biopsy for tissue diagnostic purposes. EXAM: CT-GUIDED BONE MARROW BIOPSY AND ASPIRATION MEDICATIONS: None ANESTHESIA/SEDATION: Fentanyl 50 mcg IV; Versed 1 mg IV Sedation Time: 10 Minutes; The patient was  continuously monitored during the procedure by the interventional radiology nurse under my direct supervision. COMPLICATIONS: None immediate. PROCEDURE: Informed consent was obtained from the patient following an explanation of the procedure, risks, benefits and alternatives. The patient understands, agrees and consents for the procedure. All questions were addressed. A time out was performed prior to the initiation of the procedure. The patient was positioned prone and non-contrast localization CT was performed of the pelvis to demonstrate the iliac marrow spaces. The operative site was prepped and draped in the usual sterile fashion. Under sterile conditions and local anesthesia, a 22 gauge spinal needle was utilized for procedural planning. Next, an 11 gauge coaxial bone biopsy needle was advanced into the left iliac marrow space. Needle position was confirmed with CT imaging. Initially, a bone marrow aspiration was performed. Next, a bone marrow biopsy was obtained with the 11 gauge outer bone marrow device. The 11 gauge coaxial bone biopsy needle was re-advanced into a slightly different location within the left iliac marrow space, positioning was confirmed with CT imaging and an additional bone marrow biopsy was obtained. The needle was removed and superficial hemostasis was obtained with manual compression. A dressing was applied. The patient tolerated the procedure well without immediate post procedural complication. IMPRESSION: Successful CT guided left iliac bone marrow aspiration and core biopsy. Electronically Signed   By: Sandi Mariscal M.D.   On: 02/26/2020 11:36    ASSESSMENT: AML   PLAN:    1.  AML: Patient noted to have blasts on peripheral smear.  Bone marrow biopsy is pending.  Patient was given Hydrea and allopurinol today to reduce her total white blood cell count and prevent tumor lysis syndrome.  She has declined referral to [facility], but did agree to treatment using venetoclax and  azacitidine.  Return to clinic in 1 week for further evaluation, consideration of additional blood, and treatment planning. 2.  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   3.  Elevated iron stores: Chronic and unchanged.  Desferal as above.   3.  CLL: Patient is transformed to AML as above.  Previously, all of her other labwork was either negative or within normal limits. Patient last received single agent Rituxan in February 2015.  4.  Anxiety: Chronic and unchanged.  Continue Xanax as needed.  Patient also receives 10 mg p.o.  Valium with her blood transfusions. 5.  Hypertension: Chronic and unchanged.  Unclear her compliance with her medications.  Continue follow-up and treatment per primary care. 6.  Back pain: Patient does not complain of this today. 7.  Macular degeneration: Continue follow-up with ophthalmology as needed. 8. Flank pain: Patient does not complain of this today.  Secondary to splenomegaly. 9.  Thrombocytopenia: Secondary to progression to AML.  Monitor. 10.  Anemia: Secondary to AML.  Proceed with 1 unit of blood and Desferal as above.  Patient expressed understanding and was in agreement with this plan. She also understands that She can call clinic at any time with any questions, concerns, or complaints.   Lloyd Huger, MD   02/28/2020 6:33 AM

## 2020-02-28 NOTE — Telephone Encounter (Signed)
Oral Oncology Patient Advocate Encounter  Was successful in securing patient a $10,000 grant from [employer] to provide copayment coverage for [place].  This will keep the out of pocket expense at $0.     [employer] ID: [id number]  I have spoken with the patient.   The billing information is as follows and has been shared with [place].    RxBin: [id number] PCN: [id number] Member ID: [id number] Group ID: [id number] Dates of Eligibility: 01/29/20 through 01/28/21  Fund:  Acute Myeloid [place] Patient [place] Phone [phone] Fax [phone] 02/28/2020 3:27 PM

## 2020-02-29 ENCOUNTER — Other Ambulatory Visit: Payer: Self-pay | Admitting: Oncology

## 2020-02-29 NOTE — Progress Notes (Signed)
[place]  Telephone:([phone] [phone]) [phone]  ID: Jacqueline Russo OB: 10-18-1929  MR#: [id number]  HUD#:[id number]  Patient Care Team: [name], MD as PCP - General (Internal Medicine) Lloyd Huger, MD as Consulting Physician (Hematology and Oncology)  CHIEF COMPLAINT: Stage IV mantle cell lymphoma.    INTERVAL HISTORY: Patient returns to clinic today for discussion of her final pathology results and treatment planning.  After receiving blood transfusion last week she feels significantly improved and does not complain of weakness or fatigue today. She does not complain of back pain today. She has no neurologic complaints. She denies any recent fevers or illnesses. She denies any night sweats or weight loss.  She denies any chest pain, shortness of breath, cough, or hemoptysis.  She denies any nausea, vomiting, constipation, or diarrhea. She denies any melena or hematochezia. She has no urinary complaints.  Patient offers no specific complaints today.  REVIEW OF SYSTEMS:   Review of Systems  Constitutional: Negative for fever, malaise/fatigue and weight loss.  Eyes: Negative.  Negative for blurred vision, double vision and pain.  Respiratory: Negative.  Negative for cough and shortness of breath.   Cardiovascular: Negative.  Negative for chest pain and leg swelling.  Gastrointestinal: Negative.  Negative for abdominal pain, blood in stool and melena.  Genitourinary: Negative.  Negative for dysuria and flank pain.  Musculoskeletal: Negative.  Negative for back pain and joint pain.  Skin: Negative.  Negative for rash.  Neurological: Negative.  Negative for dizziness, sensory change, focal weakness, weakness and headaches.  Endo/Heme/Allergies: Does not bruise/bleed easily.  Psychiatric/Behavioral: Negative.  The patient is not nervous/anxious.     As per HPI. Otherwise, a complete review of systems is negative.  PAST MEDICAL HISTORY: Past Medical  History:  Diagnosis Date  . Anemia    Myelodysplastic syndrome  . Arthritis   . Hypertension   . Leukemia, chronic lymphoid (HCC)    CLL  . Ovarian cancer ([place]) 1963    PAST SURGICAL HISTORY: Past Surgical History:  Procedure Laterality Date  . ABDOMINAL HYSTERECTOMY    . APPENDECTOMY    . BREAST SURGERY     cyst removed from right breast  . EYE SURGERY Bilateral    Cataract Extractions  . IR FLUORO GUIDE CV LINE RIGHT  06/30/2017  . REVERSE SHOULDER ARTHROPLASTY Right 10/28/2014   Procedure: REVERSE SHOULDER ARTHROPLASTY;  Surgeon: Corky Mull, MD;  Location: [facility] ORS;  Service: Orthopedics;  Laterality: Right;    FAMILY HISTORY: Reviewed and unchanged. No reported history of malignancy or chronic disease.     ADVANCED DIRECTIVES:    HEALTH MAINTENANCE: Social History   Tobacco Use  . Smoking status: Former Smoker    Packs/day: 2.00    Types: Cigarettes    Quit date: 1963    Years since quitting: 58.9  . Smokeless tobacco: Never Used  Vaping Use  . Vaping Use: Never used  Substance Use Topics  . Alcohol use: Not Currently    Alcohol/week: 1.0 standard drink    Types: 1 Glasses of wine per week    Comment: 1 glass of wine daily  . Drug use: No     Colonoscopy:  PAP:  Bone density:  Lipid panel:  Allergies  Allergen Reactions  . Ciprofloxacin Nausea Only    Current Outpatient Medications  Medication Sig Dispense Refill  . allopurinol (ZYLOPRIM) 300 MG tablet Take 1 tablet (300 mg total) by mouth daily. 30 tablet 0  . ALPRAZolam (  XANAX) 0.25 MG tablet Take 1 tablet (0.25 mg total) by mouth daily as needed for anxiety. 90 tablet 0  . aspirin 81 MG tablet Take 81 mg by mouth daily.    . cyanocobalamin (,VITAMIN B-12,) 1000 MCG/ML injection Inject into the muscle.    . diltiazem (TIAZAC) 240 MG 24 hr capsule Take by mouth.    . diphenhydrAMINE (SOMINEX) 25 MG tablet Take 25 mg by mouth at bedtime.    Marland Kitchen etodolac (LODINE) 400 MG tablet Take 400 mg by mouth  daily.    . hydrALAZINE (APRESOLINE) 50 MG tablet Take 50 mg by mouth 2 (two) times daily.    . hydroxyurea (HYDREA) 500 MG capsule Take 1 capsule (500 mg total) by mouth daily. May take with food to minimize GI side effects. 30 capsule 0  . lidocaine-prilocaine (EMLA) cream Apply 1 application topically as needed. Apply to port 1-2 hours prior to appointment. Cover with plastic wrap. 30 g 3  . metoCLOPramide (REGLAN) 5 MG tablet 1 tab every morning, 1 tab bedtime    . Multiple Vitamins-Minerals (PRESERVISION AREDS 2+MULTI VIT PO) Take 1 tablet by mouth 1 day or 1 dose.    Marland Kitchen omeprazole (PRILOSEC) 40 MG capsule Take by mouth.    . ondansetron (ZOFRAN) 8 MG tablet Take 1 tablet (8 mg total) by mouth every 8 (eight) hours as needed for nausea or vomiting. 20 tablet 0  . tiZANidine (ZANAFLEX) 2 MG tablet Take 1 tablet (2 mg total) by mouth at bedtime as needed for muscle spasms. 30 tablet 0  . bumetanide (BUMEX) 1 MG tablet Take by mouth. (Patient not taking: No sig reported)    . diphenhydrAMINE (BENADRYL) 50 MG tablet Take 50 mg by mouth at bedtime as needed for itching. (Patient not taking: No sig reported)    . HYDROcodone-acetaminophen (NORCO/VICODIN) 5-325 MG tablet Take 1-2 tablets by mouth every 4 (four) hours as needed for moderate pain or severe pain. 90 tablet 0  . magnesium oxide (MAG-OX) 400 (241.3 Mg) MG tablet Take 1 tablet (400 mg total) by mouth daily. (Patient not taking: No sig reported) 30 tablet 0  . mirtazapine (REMERON) 7.5 MG tablet Take 1 tablet (7.5 mg total) by mouth at bedtime. 30 tablet 2  . potassium chloride SA (KLOR-CON) 20 MEQ tablet Take 1 tablet (20 mEq total) by mouth daily. (Patient not taking: No sig reported) 10 tablet 0   No current facility-administered medications for this visit.   Facility-Administered Medications Ordered in Other Visits  Medication Dose Route Frequency Provider Last Rate Last Admin  . heparin lock flush 100 unit/mL  500 Units Intravenous  Once Lloyd Huger, MD      . sodium chloride flush (NS) 0.9 % injection 10 mL  10 mL Intravenous PRN Lloyd Huger, MD   10 mL at 09/05/18 0822    OBJECTIVE: Vitals:   03/05/20 0931  BP: (!) 203/76  Pulse: 92  Temp: 99.1 F (37.3 C)  SpO2: 96%     Body mass index is 19.22 kg/m.    ECOG FS:1 - Symptomatic but completely ambulatory  General: Thin, no acute distress. Eyes: Pink conjunctiva, anicteric sclera. HEENT: Normocephalic, moist mucous membranes. Lungs: No audible wheezing or coughing. Heart: Regular rate and rhythm. Abdomen: Soft, nontender, no obvious distention. Musculoskeletal: No edema, cyanosis, or clubbing. Neuro: Alert, answering all questions appropriately. Cranial nerves grossly intact. Skin: No rashes or petechiae noted. Psych: Normal affect.   LAB RESULTS:  Lab Results  Component Value Date   NA 140 08/22/2019   K 3.5 08/22/2019   CL 105 08/22/2019   CO2 25 08/22/2019   GLUCOSE 136 (H) 08/22/2019   BUN 23 08/22/2019   CREATININE 0.94 08/22/2019   CALCIUM 9.4 08/22/2019   PROT 6.4 (L) 08/22/2019   ALBUMIN 3.6 08/22/2019   AST 13 (L) 08/22/2019   ALT 15 08/22/2019   ALKPHOS 64 08/22/2019   BILITOT 0.6 08/22/2019   GFRNONAA 55 (L) 08/22/2019   GFRAA >60 08/22/2019    Lab Results  Component Value Date   WBC 201.5 (HH) 03/04/2020   NEUTROABS 9.9 (H) 03/04/2020   HGB 8.7 (L) 03/04/2020   HCT 28.7 (L) 03/04/2020   MCV 101.8 (H) 03/04/2020   PLT 41 (L) 03/04/2020   Lab Results  Component Value Date   IRON 268 (H) 03/04/2020   TIBC 332 03/04/2020   IRONPCTSAT 81 (H) 03/04/2020   Lab Results  Component Value Date   FERRITIN 1,569 (H) 03/04/2020     STUDIES: CT BONE MARROW BIOPSY & ASPIRATION  Result Date: 02/26/2020 INDICATION: History of MDS now with worsening laboratories. Please perform CT-guided bone marrow biopsy for tissue diagnostic purposes. EXAM: CT-GUIDED BONE MARROW BIOPSY AND ASPIRATION MEDICATIONS: None  ANESTHESIA/SEDATION: Fentanyl 50 mcg IV; Versed 1 mg IV Sedation Time: 10 Minutes; The patient was continuously monitored during the procedure by the interventional radiology nurse under my direct supervision. COMPLICATIONS: None immediate. PROCEDURE: Informed consent was obtained from the patient following an explanation of the procedure, risks, benefits and alternatives. The patient understands, agrees and consents for the procedure. All questions were addressed. A time out was performed prior to the initiation of the procedure. The patient was positioned prone and non-contrast localization CT was performed of the pelvis to demonstrate the iliac marrow spaces. The operative site was prepped and draped in the usual sterile fashion. Under sterile conditions and local anesthesia, a 22 gauge spinal needle was utilized for procedural planning. Next, an 11 gauge coaxial bone biopsy needle was advanced into the left iliac marrow space. Needle position was confirmed with CT imaging. Initially, a bone marrow aspiration was performed. Next, a bone marrow biopsy was obtained with the 11 gauge outer bone marrow device. The 11 gauge coaxial bone biopsy needle was re-advanced into a slightly different location within the left iliac marrow space, positioning was confirmed with CT imaging and an additional bone marrow biopsy was obtained. The needle was removed and superficial hemostasis was obtained with manual compression. A dressing was applied. The patient tolerated the procedure well without immediate post procedural complication. IMPRESSION: Successful CT guided left iliac bone marrow aspiration and core biopsy. Electronically Signed   By: Sandi Mariscal M.D.   On: 02/26/2020 11:36    ASSESSMENT: Stage IV mantle cell lymphoma.   PLAN:    1.  Stage IV mantle cell lymphoma: Case discussed with pathology confirming diagnosis.  Patient has been instructed to increase Hydrea to 1000 mg daily and increase allopurinol to 600  mg daily.  She will benefit from treatment with Rituxan and Treanda on day 1 and then Treanda on day 2 for 28-day cycle.  Plan to do at least 6 cycles.  Given patient's thrombocytopenia, will dose reduce Treanda at the onset.  Return to clinic in 1 to 2 weeks to initiate cycle 1, day 1 of treatment.  Will get a PET scan for staging purposes, but will did not delay of the start of treatment for this procedure.  2.  MDS, 5q-: Treat mantle cell as above. 3.  Elevated iron stores: Chronic and unchanged.  Patient continues to require Desferal with blood transfusions. 3.  CLL: Patient has transformed to mantle cell as above.  Previously, all of her other labwork was either negative or within normal limits. Patient last received single agent Rituxan in February 2015.  4.  Anxiety: Chronic and unchanged.  Continue Xanax as needed.  Patient also receives 10 mg p.o. Valium with her blood transfusions. 5.  Hypertension: Chronic and unchanged.  Unclear her compliance with her medications.  Continue follow-up and treatment per primary care. 6.  Back pain: Patient does not complain of this today. 7.  Macular degeneration: Continue follow-up with ophthalmology as needed. 8. Flank pain: Patient does not complain of this today.  Secondary to splenomegaly. 9.  Thrombocytopenia: Secondary to progressive disease.  Proceed with treatment and dose reduce Treanda as above. 10.  Anemia: Improved with transfusion last week.  Patient will require Desferal with all infusions.  Patient expressed understanding and was in agreement with this plan. She also understands that She can call clinic at any time with any questions, concerns, or complaints.   Lloyd Huger, MD   03/05/2020 1:47 PM

## 2020-03-03 ENCOUNTER — Ambulatory Visit: Payer: Medicare Other | Admitting: Oncology

## 2020-03-03 NOTE — Telephone Encounter (Signed)
Path results came back as Mantle Cell Lymphoma, not AML.    [place] Patient [place] Phone [phone] Fax [phone] 03/03/2020 3:04 PM

## 2020-03-04 ENCOUNTER — Inpatient Hospital Stay: Payer: Medicare Other

## 2020-03-04 ENCOUNTER — Other Ambulatory Visit: Payer: Self-pay

## 2020-03-04 ENCOUNTER — Telehealth: Payer: Self-pay | Admitting: *Deleted

## 2020-03-04 DIAGNOSIS — D46C Myelodysplastic syndrome with isolated del(5q) chromosomal abnormality: Secondary | ICD-10-CM

## 2020-03-04 DIAGNOSIS — Z5111 Encounter for antineoplastic chemotherapy: Secondary | ICD-10-CM | POA: Diagnosis not present

## 2020-03-04 LAB — CBC WITH DIFFERENTIAL/PLATELET
Abs Immature Granulocytes: 1.37 10*3/uL — ABNORMAL HIGH (ref 0.00–0.07)
Basophils Absolute: 0.4 10*3/uL — ABNORMAL HIGH (ref 0.0–0.1)
Basophils Relative: 0 %
Eosinophils Absolute: 0.5 10*3/uL (ref 0.0–0.5)
Eosinophils Relative: 0 %
HCT: 28.7 % — ABNORMAL LOW (ref 36.0–46.0)
Hemoglobin: 8.7 g/dL — ABNORMAL LOW (ref 12.0–15.0)
Immature Granulocytes: 1 %
Lymphocytes Relative: 92 %
Lymphs Abs: 185.6 10*3/uL — ABNORMAL HIGH (ref 0.7–4.0)
MCH: 30.9 pg (ref 26.0–34.0)
MCHC: 30.3 g/dL (ref 30.0–36.0)
MCV: 101.8 fL — ABNORMAL HIGH (ref 80.0–100.0)
Monocytes Absolute: 3.7 10*3/uL — ABNORMAL HIGH (ref 0.1–1.0)
Monocytes Relative: 2 %
Neutro Abs: 9.9 10*3/uL — ABNORMAL HIGH (ref 1.7–7.7)
Neutrophils Relative %: 5 %
Platelets: 41 10*3/uL — ABNORMAL LOW (ref 150–400)
RBC: 2.82 MIL/uL — ABNORMAL LOW (ref 3.87–5.11)
RDW: 22.1 % — ABNORMAL HIGH (ref 11.5–15.5)
Smear Review: NORMAL
WBC Morphology: ABNORMAL
WBC: 201.5 10*3/uL (ref 4.0–10.5)
nRBC: 0.1 % (ref 0.0–0.2)

## 2020-03-04 LAB — IRON AND TIBC
Iron: 268 ug/dL — ABNORMAL HIGH (ref 28–170)
Saturation Ratios: 81 % — ABNORMAL HIGH (ref 10.4–31.8)
TIBC: 332 ug/dL (ref 250–450)
UIBC: 64 ug/dL

## 2020-03-04 LAB — FERRITIN: Ferritin: 1569 ng/mL — ABNORMAL HIGH (ref 11–307)

## 2020-03-04 LAB — SAMPLE TO BLOOD BANK

## 2020-03-04 NOTE — Telephone Encounter (Signed)
Called patient to verify medication dosage and that she had been taking Hydrea as prescribed. Patient has been taking 500 mg daily as prescribed, per Dr. Grayland Ormond increase dose to 1000 mg daily. Patient verbalized understanding of new instructions. Patients hgb 8.7 today, per Dr. Grayland Ormond she does not need blood transfusion tomorrow but patient to keep f/u as scheduled to see Dr. Grayland Ormond and [facility]

## 2020-03-05 ENCOUNTER — Encounter: Payer: Self-pay | Admitting: Oncology

## 2020-03-05 ENCOUNTER — Other Ambulatory Visit: Payer: Self-pay

## 2020-03-05 ENCOUNTER — Inpatient Hospital Stay (HOSPITAL_BASED_OUTPATIENT_CLINIC_OR_DEPARTMENT_OTHER): Payer: Medicare Other | Admitting: Oncology

## 2020-03-05 ENCOUNTER — Inpatient Hospital Stay: Payer: Medicare Other | Admitting: Pharmacist

## 2020-03-05 ENCOUNTER — Inpatient Hospital Stay (HOSPITAL_BASED_OUTPATIENT_CLINIC_OR_DEPARTMENT_OTHER): Payer: Medicare Other | Admitting: Hospice and Palliative Medicine

## 2020-03-05 ENCOUNTER — Inpatient Hospital Stay: Payer: Medicare Other

## 2020-03-05 VITALS — BP 203/76 | HR 92 | Temp 99.1°F | Wt 98.4 lb

## 2020-03-05 DIAGNOSIS — Z515 Encounter for palliative care: Secondary | ICD-10-CM

## 2020-03-05 DIAGNOSIS — Z7189 Other specified counseling: Secondary | ICD-10-CM | POA: Diagnosis not present

## 2020-03-05 DIAGNOSIS — Z5111 Encounter for antineoplastic chemotherapy: Secondary | ICD-10-CM | POA: Diagnosis not present

## 2020-03-05 DIAGNOSIS — C9200 Acute myeloblastic leukemia, not having achieved remission: Secondary | ICD-10-CM

## 2020-03-05 DIAGNOSIS — G4700 Insomnia, unspecified: Secondary | ICD-10-CM

## 2020-03-05 DIAGNOSIS — I6523 Occlusion and stenosis of bilateral carotid arteries: Secondary | ICD-10-CM | POA: Diagnosis not present

## 2020-03-05 DIAGNOSIS — C8310 Mantle cell lymphoma, unspecified site: Secondary | ICD-10-CM | POA: Insufficient documentation

## 2020-03-05 DIAGNOSIS — C8318 Mantle cell lymphoma, lymph nodes of multiple sites: Secondary | ICD-10-CM | POA: Diagnosis not present

## 2020-03-05 DIAGNOSIS — G893 Neoplasm related pain (acute) (chronic): Secondary | ICD-10-CM

## 2020-03-05 MED ORDER — LIDOCAINE-PRILOCAINE 2.5-2.5 % EX CREA: TOPICAL_CREAM | CUTANEOUS | 3 refills | Status: DC

## 2020-03-05 MED ORDER — MIRTAZAPINE 7.5 MG PO TABS
7.5000 mg | ORAL_TABLET | Freq: Every day | ORAL | 2 refills | Status: AC
Start: 2020-03-05 — End: ?

## 2020-03-05 MED ORDER — HYDROCODONE-ACETAMINOPHEN 5-325 MG PO TABS: 1.0000 | ORAL_TABLET | ORAL | 0 refills | Status: DC | PRN

## 2020-03-05 MED ORDER — ONDANSETRON HCL 8 MG PO TABS: 8.0000 mg | ORAL_TABLET | Freq: Two times a day (BID) | ORAL | 1 refills | Status: DC | PRN

## 2020-03-05 MED ORDER — PROCHLORPERAZINE MALEATE 10 MG PO TABS: 10.0000 mg | ORAL_TABLET | Freq: Four times a day (QID) | ORAL | 1 refills | Status: DC | PRN

## 2020-03-05 NOTE — Progress Notes (Signed)
Patient states she is urinating more frequently and cant seem to hold. States she has to wear a pad.

## 2020-03-05 NOTE — Progress Notes (Signed)
START ON PATHWAY REGIMEN - Lymphoma and CLL     A cycle is every 28 days:     Rituximab-xxxx      Bendamustine   **Always confirm dose/schedule in your pharmacy ordering system**  Patient Characteristics: Mantle Cell Lymphoma, First Line, Aggressive Disease or Treatment Indicated, Stage II - IV, Not a Transplant Candidate Disease Type: Not Applicable Disease Type: Mantle Cell Lymphoma Disease Type: Not Applicable Line of Therapy: First Line Patient Characteristics: Not a Transplant Candidate Intent of Therapy: Non-Curative / Palliative Intent, Discussed with Patient

## 2020-03-05 NOTE — Progress Notes (Signed)
[facility]  [phone] [phone]   Name: Jacqueline Russo Date: 03/05/2020 MRN: [id number]  DOB: 11-27-1929  Patient Care Team: [name], MD as PCP - General (Internal Medicine) Lloyd Huger, MD as Consulting Physician (Hematology and Oncology)    REASON FOR CONSULTATION: Jacqueline Russo is a 86 y.o. 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  She was referred to palliative care to help address goals and manage any symptomatic complaints.  SOCIAL HISTORY:     reports that she quit smoking about 58 years ago. Her smoking use included cigarettes. She smoked 2.00 packs per day. She has never used smokeless tobacco. She reports previous alcohol use of about 1.0 standard drink of alcohol per week. She reports that she does not use drugs.   Patient is widowed.  She lives at the [facility].  Patient has a son in [place] and another son in [place].  She has a sister who lives nearby.  Patient is originally from [place] and then lived in [place] prior to immigrating to the [place].  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  ADVANCE DIRECTIVES:  On file  CODE STATUS: DNR/DNI (MOST form completed on 05/23/19)  PAST MEDICAL HISTORY: Past Medical History:  Diagnosis Date  . Anemia    Myelodysplastic syndrome  . Arthritis   . Hypertension   . Leukemia, chronic lymphoid (HCC)    CLL  . Ovarian cancer ([facility]) 1963    PAST SURGICAL HISTORY:  Past Surgical History:  Procedure Laterality Date  . ABDOMINAL HYSTERECTOMY    . APPENDECTOMY    . BREAST SURGERY     cyst removed from right breast  . EYE SURGERY Bilateral    Cataract Extractions  . IR FLUORO GUIDE CV LINE RIGHT  06/30/2017  . REVERSE SHOULDER ARTHROPLASTY Right 10/28/2014   Procedure: REVERSE SHOULDER ARTHROPLASTY;  Surgeon: Corky Mull, MD;  Location: [facility] ORS;  Service: Orthopedics;  Laterality: Right;    HEMATOLOGY/ONCOLOGY HISTORY:  Oncology History Overview Note  History of MDS, 5q deletion.  Patient refused treatment with Revlimid.  Underwent a bone marrow biopsy on 06/30/2017 which showed hypercellular marrow 50% with dyspoiesis and increased blasts ~6% consistent with myelodysplastic syndrome.  Given history of MDS with isolated deletion (5q), presence of increased blasts likely indicative of progressive disease.  Now transfusion dependent.  She continued to refuse Revlimid.  Previously had discussed possibility of progressing to AML but patient declined further interventions beyond periodic blood transfusions.   History of iron overload and she receives Desferal prior to transfusions.   Patient has history of CLL and received single agent Rituxan in February 2015.  Previous bone marrow biopsy did not mention any involvement of CLL.    Oncology History     CLL (chronic lymphocytic leukemia) ([facility])  11/16/2015 Initial Diagnosis   CLL (chronic lymphocytic leukemia) ([facility])   Mantle cell lymphoma ([facility])  03/05/2020 Initial Diagnosis   Mantle cell lymphoma ([facility])   03/12/2020 -  Chemotherapy   The patient had DEXAMETHASONE 4 MG PO TABS, 8 mg, Oral, Daily, 0 of 1 cycle, Start date: --, End date: -- PALONOSETRON HCL INJECTION 0.25 MG/5ML, 0.25 mg, Intravenous,  Once, 0 of 6 cycles BENDAMUSTINE CHEMO IV INFUSION, 90 mg/m2, Intravenous,  Once, 0 of 6 cycles RITUXIMAB-PVVR CHEMO IV INFUSION, 375 mg/m2, Intravenous,  Once, 0  of 6 cycles  for chemotherapy treatment.      ALLERGIES:  is allergic to ciprofloxacin.  MEDICATIONS:  Current Outpatient Medications  Medication Sig Dispense Refill  . allopurinol (ZYLOPRIM) 300 MG tablet Take 1 tablet (300 mg total) by mouth daily. 30 tablet 0  . ALPRAZolam (XANAX) 0.25 MG tablet Take 1 tablet (0.25 mg total) by mouth daily as needed for anxiety. 90 tablet 0  . aspirin 81 MG  tablet Take 81 mg by mouth daily.    . bumetanide (BUMEX) 1 MG tablet Take by mouth. (Patient not taking: No sig reported)    . cyanocobalamin (,VITAMIN B-12,) 1000 MCG/ML injection Inject into the muscle.    . diltiazem (TIAZAC) 240 MG 24 hr capsule Take by mouth.    . diphenhydrAMINE (BENADRYL) 50 MG tablet Take 50 mg by mouth at bedtime as needed for itching. (Patient not taking: No sig reported)    . diphenhydrAMINE (SOMINEX) 25 MG tablet Take 25 mg by mouth at bedtime.    Marland Kitchen etodolac (LODINE) 400 MG tablet Take 400 mg by mouth daily.    . hydrALAZINE (APRESOLINE) 50 MG tablet Take 50 mg by mouth 2 (two) times daily.    Marland Kitchen HYDROcodone-acetaminophen (NORCO/VICODIN) 5-325 MG tablet Take 1-2 tablets by mouth every 4 (four) hours as needed for moderate pain or severe pain. 90 tablet 0  . hydroxyurea (HYDREA) 500 MG capsule Take 1 capsule (500 mg total) by mouth daily. May take with food to minimize GI side effects. 30 capsule 0  . lidocaine-prilocaine (EMLA) cream Apply 1 application topically as needed. Apply to port 1-2 hours prior to appointment. Cover with plastic wrap. 30 g 3  . magnesium oxide (MAG-OX) 400 (241.3 Mg) MG tablet Take 1 tablet (400 mg total) by mouth daily. (Patient not taking: No sig reported) 30 tablet 0  . metoCLOPramide (REGLAN) 5 MG tablet 1 tab every morning, 1 tab bedtime    . mirtazapine (REMERON) 7.5 MG tablet Take 1 tablet (7.5 mg total) by mouth at bedtime. 30 tablet 2  . Multiple Vitamins-Minerals (PRESERVISION AREDS 2+MULTI VIT PO) Take 1 tablet by mouth 1 day or 1 dose.    Marland Kitchen omeprazole (PRILOSEC) 40 MG capsule Take by mouth.    . ondansetron (ZOFRAN) 8 MG tablet Take 1 tablet (8 mg total) by mouth every 8 (eight) hours as needed for nausea or vomiting. 20 tablet 0  . potassium chloride SA (KLOR-CON) 20 MEQ tablet Take 1 tablet (20 mEq total) by mouth daily. (Patient not taking: No sig reported) 10 tablet 0  . tiZANidine (ZANAFLEX) 2 MG tablet Take 1 tablet (2 mg  total) by mouth at bedtime as needed for muscle spasms. 30 tablet 0   No current facility-administered medications for this visit.   Facility-Administered Medications Ordered in Other Visits  Medication Dose Route Frequency Provider Last Rate Last Admin  . heparin lock flush 100 unit/mL  500 Units Intravenous Once Lloyd Huger, MD      . sodium chloride flush (NS) 0.9 % injection 10 mL  10 mL Intravenous PRN Lloyd Huger, MD   10 mL at 09/05/18 5784    VITAL SIGNS: There were no vitals taken for this visit. There were no vitals filed for this visit.  Estimated body mass index is 19.22 kg/m as calculated from the following:   Height as of 02/26/20: 5' (1.524 m).   Weight as of an earlier encounter on 03/05/20: 98 lb 6.4 oz (44.6 kg).  LABS: CBC:    Component Value Date/Time   WBC 201.5 (HH) 03/04/2020 1410   HGB 8.7 (L) 03/04/2020 1410   HGB 11.6 (L) 01/06/2014 0828   HCT 28.7 (L) 03/04/2020 1410   HCT 32.7 (L) 01/06/2014 0828   PLT 41 (L) 03/04/2020 1410   PLT 176 01/06/2014 0828   MCV 101.8 (H) 03/04/2020 1410   MCV 103 (H) 01/06/2014 0828   NEUTROABS 9.9 (H) 03/04/2020 1410   NEUTROABS 1.6 01/06/2014 0828   LYMPHSABS 185.6 (H) 03/04/2020 1410   LYMPHSABS 1.4 01/06/2014 0828   MONOABS 3.7 (H) 03/04/2020 1410   MONOABS 0.3 01/06/2014 0828   EOSABS 0.5 03/04/2020 1410   EOSABS 0.1 01/06/2014 0828   BASOSABS 0.4 (H) 03/04/2020 1410   BASOSABS 0.1 01/06/2014 0828   Comprehensive Metabolic Panel:    Component Value Date/Time   NA 140 08/22/2019 0900   NA 140 06/26/2013 1044   K 3.5 08/22/2019 0900   K 3.7 06/26/2013 1044   CL 105 08/22/2019 0900   CL 102 06/26/2013 1044   CO2 25 08/22/2019 0900   CO2 32 06/26/2013 1044   BUN 23 08/22/2019 0900   BUN 16 06/26/2013 1044   CREATININE 0.94 08/22/2019 0900   CREATININE 1.01 01/06/2014 0828   GLUCOSE 136 (H) 08/22/2019 0900   GLUCOSE 121 (H) 06/26/2013 1044   CALCIUM 9.4 08/22/2019 0900   CALCIUM 9.0  06/26/2013 1044   AST 13 (L) 08/22/2019 0900   AST 12 (L) 03/27/2013 0955   ALT 15 08/22/2019 0900   ALT 20 03/27/2013 0955   ALKPHOS 64 08/22/2019 0900   ALKPHOS 87 03/27/2013 0955   BILITOT 0.6 08/22/2019 0900   BILITOT 0.5 03/27/2013 0955   PROT 6.4 (L) 08/22/2019 0900   PROT 7.3 03/27/2013 0955   ALBUMIN 3.6 08/22/2019 0900   ALBUMIN 4.1 03/27/2013 0955    RADIOGRAPHIC STUDIES: CT BONE MARROW BIOPSY & ASPIRATION  Result Date: 02/26/2020 INDICATION: History of MDS now with worsening laboratories. Please perform CT-guided bone marrow biopsy for tissue diagnostic purposes. EXAM: CT-GUIDED BONE MARROW BIOPSY AND ASPIRATION MEDICATIONS: None ANESTHESIA/SEDATION: Fentanyl 50 mcg IV; Versed 1 mg IV Sedation Time: 10 Minutes; The patient was continuously monitored during the procedure by the interventional radiology nurse under my direct supervision. COMPLICATIONS: None immediate. PROCEDURE: Informed consent was obtained from the patient following an explanation of the procedure, risks, benefits and alternatives. The patient understands, agrees and consents for the procedure. All questions were addressed. A time out was performed prior to the initiation of the procedure. The patient was positioned prone and non-contrast localization CT was performed of the pelvis to demonstrate the iliac marrow spaces. The operative site was prepped and draped in the usual sterile fashion. Under sterile conditions and local anesthesia, a 22 gauge spinal needle was utilized for procedural planning. Next, an 11 gauge coaxial bone biopsy needle was advanced into the left iliac marrow space. Needle position was confirmed with CT imaging. Initially, a bone marrow aspiration was performed. Next, a bone marrow biopsy was obtained with the 11 gauge outer bone marrow device. The 11 gauge coaxial bone biopsy needle was re-advanced into a slightly different location within the left iliac marrow space, positioning was confirmed  with CT imaging and an additional bone marrow biopsy was obtained. The needle was removed and superficial hemostasis was obtained with manual compression. A dressing was applied. The patient tolerated the procedure well without immediate post procedural complication. IMPRESSION: Successful CT guided left iliac bone marrow  aspiration and core biopsy. Electronically Signed   By: Sandi Mariscal M.D.   On: 02/26/2020 11:36    PERFORMANCE STATUS (ECOG) : 1 - Symptomatic but completely ambulatory  Review of Systems Unless otherwise noted, a complete review of systems is negative.  Physical Exam General: NAD Pulmonary: Unlabored Abdomen: soft, nontender, + bowel sounds GU: no suprapubic tenderness Extremities: Trace bilateral lower edema, no joint deformities Skin: no rashes Neurological: Weakness but otherwise nonfocal  IMPRESSION: Routine follow-up visit today.    Patient was seen following her visit with Dr. Grayland Ormond.  Unfortunately, patient received bad news today associated with her diagnosis of advanced mantle cell lymphoma.  Plan is to start patient on Rituxan and bendamustine.  However, her overall prognosis is felt to be poor.  Patient speaks candidly about her end-of-life but is hopeful that she can maintain quality of life for as long as possible.  She plans to travel to [place] to be with her son in mid January for a surgical operation.  Symptomatically, she says she is not sleeping well at night.  She was previously tried on mirtazapine but does not recall if she ever took it.  We will restart mirtazapine at 7.5 mg nightly as needed.  Patient has occasional generalized pain and requests refill of her Norco.  Patient confirms that she is a DNR/DNI.  I completed a new DNR order for her to take home today.  PLAN: -Best supportive care -Restart mirtazapine 7.5 mg nightly as needed -Refill Norco -DNR/DNI -RTC in 2 weeks  Case and plan discussed in detail with Dr.  Grayland Ormond   Patient expressed understanding and was in agreement with this plan. She also understands that She can call the clinic at any time with any questions, concerns, or complaints.     Time Total: 15 minutes  Visit consisted of counseling and education dealing with the complex and emotionally intense issues of symptom management and palliative care in the setting of serious and potentially life-threatening illness.Greater than 50%  of this time was spent counseling and coordinating care related to the above assessment and plan.  Signed by: Altha Harm, PhD, NP-C

## 2020-03-06 NOTE — Patient Instructions (Signed)
Rituximab injection What is this medicine? RITUXIMAB (ri TUX i mab) is a monoclonal antibody. It is used to treat certain types of cancer like non-Hodgkin lymphoma and chronic lymphocytic leukemia. It is also used to treat rheumatoid arthritis, granulomatosis with polyangiitis (or Wegener's granulomatosis), microscopic polyangiitis, and pemphigus vulgaris. This medicine may be used for other purposes; ask your health care provider or pharmacist if you have questions. COMMON BRAND NAME(S): Rituxan, RUXIENCE What should I tell my health care provider before I take this medicine? They need to know if you have any of these conditions:  heart disease  infection (especially a virus infection such as hepatitis B, chickenpox, cold sores, or herpes)  immune system problems  irregular heartbeat  kidney disease  low blood counts, like low white cell, platelet, or red cell counts  lung or breathing disease, like asthma  recently received or scheduled to receive a vaccine  an unusual or allergic reaction to rituximab, other medicines, foods, dyes, or preservatives  pregnant or trying to get pregnant  breast-feeding How should I use this medicine? This medicine is for infusion into a vein. It is administered in a hospital or clinic by a specially trained health care professional. A special MedGuide will be given to you by the pharmacist with each prescription and refill. Be sure to read this information carefully each time. Talk to your pediatrician regarding the use of this medicine in children. This medicine is not approved for use in children. Overdosage: If you think you have taken too much of this medicine contact a poison control center or emergency room at once. NOTE: This medicine is only for you. Do not share this medicine with others. What if I miss a dose? It is important not to miss a dose. Call your doctor or health care professional if you are unable to keep an appointment. What  may interact with this medicine?  cisplatin  live virus vaccines This list may not describe all possible interactions. Give your health care provider a list of all the medicines, herbs, non-prescription drugs, or dietary supplements you use. Also tell them if you smoke, drink alcohol, or use illegal drugs. Some items may interact with your medicine. What should I watch for while using this medicine? Your condition will be monitored carefully while you are receiving this medicine. You may need blood work done while you are taking this medicine. This medicine can cause serious allergic reactions. To reduce your risk you may need to take medicine before treatment with this medicine. Take your medicine as directed. In some patients, this medicine may cause a serious brain infection that may cause death. If you have any problems seeing, thinking, speaking, walking, or standing, tell your healthcare professional right away. If you cannot reach your healthcare professional, urgently seek other source of medical care. Call your doctor or health care professional for advice if you get a fever, chills or sore throat, or other symptoms of a cold or flu. Do not treat yourself. This drug decreases your body's ability to fight infections. Try to avoid being around people who are sick. Do not become pregnant while taking this medicine or for at least 12 months after stopping it. Women should inform their doctor if they wish to become pregnant or think they might be pregnant. There is a potential for serious side effects to an unborn child. Talk to your health care professional or pharmacist for more information. Do not breast-feed an infant while taking this medicine or for at   least 6 months after stopping it. What side effects may I notice from receiving this medicine? Side effects that you should report to your doctor or health care professional as soon as possible:  allergic reactions like skin rash, itching or  hives; swelling of the face, lips, or tongue  breathing problems  chest pain  changes in vision  diarrhea  headache with fever, neck stiffness, sensitivity to light, nausea, or confusion  fast, irregular heartbeat  loss of memory  low blood counts - this medicine may decrease the number of white blood cells, red blood cells and platelets. You may be at increased risk for infections and bleeding.  mouth sores  problems with balance, talking, or walking  redness, blistering, peeling or loosening of the skin, including inside the mouth  signs of infection - fever or chills, cough, sore throat, pain or difficulty passing urine  signs and symptoms of kidney injury like trouble passing urine or change in the amount of urine  signs and symptoms of liver injury like dark yellow or brown urine; general ill feeling or flu-like symptoms; light-colored stools; loss of appetite; nausea; right upper belly pain; unusually weak or tired; yellowing of the eyes or skin  signs and symptoms of low blood pressure like dizziness; feeling faint or lightheaded, falls; unusually weak or tired  stomach pain  swelling of the ankles, feet, hands  unusual bleeding or bruising  vomiting Side effects that usually do not require medical attention (report to your doctor or health care professional if they continue or are bothersome):  headache  joint pain  muscle cramps or muscle pain  nausea  tiredness This list may not describe all possible side effects. Call your doctor for medical advice about side effects. You may report side effects to FDA at 1-800-FDA-1088. Where should I keep my medicine? This drug is given in a hospital or clinic and will not be stored at home. NOTE: This sheet is a summary. It may not cover all possible information. If you have questions about this medicine, talk to your doctor, pharmacist, or health care provider.  2020 [employer]/Gold Standard (2018-04-18  22:01:36) Bendamustine Injection What is this medicine? BENDAMUSTINE (BEN da MUS teen) is a chemotherapy drug. It is used to treat chronic lymphocytic leukemia and non-Hodgkin lymphoma. This medicine may be used for other purposes; ask your health care provider or pharmacist if you have questions. COMMON BRAND NAME(S): Kristine Royal, Treanda What should I tell my health care provider before I take this medicine? They need to know if you have any of these conditions:  infection (especially a virus infection such as chickenpox, cold sores, or herpes)  kidney disease  liver disease  an unusual or allergic reaction to bendamustine, mannitol, other medicines, foods, dyes, or preservatives  pregnant or trying to get pregnant  breast-feeding How should I use this medicine? This medicine is for infusion into a vein. It is given by a health care professional in a hospital or clinic setting. Talk to your pediatrician regarding the use of this medicine in children. Special care may be needed. Overdosage: If you think you have taken too much of this medicine contact a poison control center or emergency room at once. NOTE: This medicine is only for you. Do not share this medicine with others. What if I miss a dose? It is important not to miss your dose. Call your doctor or health care professional if you are unable to keep an appointment. What may interact with this  medicine? Do not take this medicine with any of the following medications:  clozapine This medicine may also interact with the following medications:  atazanavir  cimetidine  ciprofloxacin  enoxacin  fluvoxamine  medicines for seizures like carbamazepine and phenobarbital  mexiletine  rifampin  tacrine  thiabendazole  zileuton This list may not describe all possible interactions. Give your health care provider a list of all the medicines, herbs, non-prescription drugs, or dietary supplements you use. Also tell  them if you smoke, drink alcohol, or use illegal drugs. Some items may interact with your medicine. What should I watch for while using this medicine? This drug may make you feel generally unwell. This is not uncommon, as chemotherapy can affect healthy cells as well as cancer cells. Report any side effects. Continue your course of treatment even though you feel ill unless your doctor tells you to stop. You may need blood work done while you are taking this medicine. Call your doctor or healthcare provider for advice if you get a fever, chills or sore throat, or other symptoms of a cold or flu. Do not treat yourself. This drug decreases your body's ability to fight infections. Try to avoid being around people who are sick. This medicine may cause serious skin reactions. They can happen weeks to months after starting the medicine. Contact your healthcare provider right away if you notice fevers or flu-like symptoms with a rash. The rash may be red or purple and then turn into blisters or peeling of the skin. Or, you might notice a red rash with swelling of the face, lips or lymph nodes in your neck or under your arms. This medicine may increase your risk to bruise or bleed. Call your doctor or healthcare provider if you notice any unusual bleeding. Talk to your doctor about your risk of cancer. You may be more at risk for certain types of cancers if you take this medicine. Do not become pregnant while taking this medicine or for at least 6 months after stopping it. Women should inform their doctor if they wish to become pregnant or think they might be pregnant. Men should not father a child while taking this medicine and for at least 3 months after stopping it. There is a potential for serious side effects to an unborn child. Talk to your healthcare provider or pharmacist for more information. Do not breast-feed an infant while taking this medicine or for at least 1 week after stopping it. This medicine may  make it more difficult to father a child. You should talk with your doctor or healthcare provider if you are concerned about your fertility. What side effects may I notice from receiving this medicine? Side effects that you should report to your doctor or health care professional as soon as possible:  allergic reactions like skin rash, itching or hives, swelling of the face, lips, or tongue  low blood counts - this medicine may decrease the number of white blood cells, red blood cells and platelets. You may be at increased risk for infections and bleeding.  rash, fever, and swollen lymph nodes  redness, blistering, peeling, or loosening of the skin, including inside the mouth  signs of infection like fever or chills, cough, sore throat, pain or difficulty passing urine  signs of decreased platelets or bleeding like bruising, pinpoint red spots on the skin, black, tarry stools, blood in the urine  signs of decreased red blood cells like being unusually weak or tired, fainting spells, lightheadedness  signs  and symptoms of kidney injury like trouble passing urine or change in the amount of urine  signs and symptoms of liver injury like dark yellow or brown urine; general ill feeling or flu-like symptoms; light-colored stools; loss of appetite; nausea; right upper belly pain; unusually weak or tired; yellowing of the eyes or skin Side effects that usually do not require medical attention (report to your doctor or health care professional if they continue or are bothersome):  constipation  decreased appetite  diarrhea  headache  mouth sores  nausea, vomiting  tiredness This list may not describe all possible side effects. Call your doctor for medical advice about side effects. You may report side effects to FDA at 1-800-FDA-1088. Where should I keep my medicine? This drug is given in a hospital or clinic and will not be stored at home. NOTE: This sheet is a summary. It may not  cover all possible information. If you have questions about this medicine, talk to your doctor, pharmacist, or health care provider.  2020 [employer]/Gold Standard (2018-05-29 10:26:46)

## 2020-03-09 ENCOUNTER — Inpatient Hospital Stay: Payer: Medicare Other

## 2020-03-09 ENCOUNTER — Other Ambulatory Visit: Payer: Self-pay

## 2020-03-09 ENCOUNTER — Encounter (HOSPITAL_COMMUNITY): Payer: Self-pay | Admitting: Oncology

## 2020-03-11 ENCOUNTER — Encounter: Payer: Self-pay | Admitting: Oncology

## 2020-03-12 ENCOUNTER — Other Ambulatory Visit: Payer: Self-pay | Admitting: *Deleted

## 2020-03-12 MED ORDER — HYDROXYUREA 500 MG PO CAPS: 1000.0000 mg | ORAL_CAPSULE | Freq: Every day | ORAL | 0 refills | Status: DC

## 2020-03-15 NOTE — Progress Notes (Signed)
[place]  [phone] [phone]  ID: Chaney Born OB: Jan 25, 1930  MR#: [id number]  JKK#:[id number]  Patient Care Team: [name], MD as PCP - General (Internal Medicine) Lloyd Huger, MD as Consulting Physician (Hematology and Oncology)  CHIEF COMPLAINT: Stage IV mantle cell lymphoma.    INTERVAL HISTORY: Patient returns to clinic today for further evaluation and initiation of cycle 1, day 1 of Rituxan and Treanda.  She continues to feel well despite her blood counts and does not complain of weakness or fatigue today. She does not complain of back pain today. She has no neurologic complaints. She denies any recent fevers or illnesses. She denies any night sweats or weight loss.  She denies any chest pain, shortness of breath, cough, or hemoptysis.  She denies any nausea, vomiting, constipation, or diarrhea. She denies any melena or hematochezia. She has no urinary complaints.  Patient offers no specific complaints today.  REVIEW OF SYSTEMS:   Review of Systems  Constitutional: Negative for fever, malaise/fatigue and weight loss.  Eyes: Negative.  Negative for blurred vision, double vision and pain.  Respiratory: Negative.  Negative for cough and shortness of breath.   Cardiovascular: Negative.  Negative for chest pain and leg swelling.  Gastrointestinal: Negative.  Negative for abdominal pain, blood in stool and melena.  Genitourinary: Negative.  Negative for dysuria and flank pain.  Musculoskeletal: Negative.  Negative for back pain and joint pain.  Skin: Negative.  Negative for rash.  Neurological: Negative.  Negative for dizziness, sensory change, focal weakness, weakness and headaches.  Endo/Heme/Allergies: Does not bruise/bleed easily.  Psychiatric/Behavioral: Negative.  The patient is not nervous/anxious.     As per HPI. Otherwise, a complete review of systems is negative.  PAST MEDICAL HISTORY: Past Medical History:   Diagnosis Date  . Anemia    Myelodysplastic syndrome  . Arthritis   . Hypertension   . Leukemia, chronic lymphoid (HCC)    CLL  . Ovarian cancer ([place]) 1963    PAST SURGICAL HISTORY: Past Surgical History:  Procedure Laterality Date  . ABDOMINAL HYSTERECTOMY    . APPENDECTOMY    . BREAST SURGERY     cyst removed from right breast  . EYE SURGERY Bilateral    Cataract Extractions  . IR FLUORO GUIDE CV LINE RIGHT  06/30/2017  . REVERSE SHOULDER ARTHROPLASTY Right 10/28/2014   Procedure: REVERSE SHOULDER ARTHROPLASTY;  Surgeon: Corky Mull, MD;  Location: [facility] ORS;  Service: Orthopedics;  Laterality: Right;    FAMILY HISTORY: Reviewed and unchanged. No reported history of malignancy or chronic disease.     ADVANCED DIRECTIVES:    HEALTH MAINTENANCE: Social History   Tobacco Use  . Smoking status: Former Smoker    Packs/day: 2.00    Types: Cigarettes    Quit date: 1963    Years since quitting: 59.0  . Smokeless tobacco: Never Used  Vaping Use  . Vaping Use: Never used  Substance Use Topics  . Alcohol use: Not Currently    Alcohol/week: 1.0 standard drink    Types: 1 Glasses of wine per week    Comment: 1 glass of wine daily  . Drug use: No     Colonoscopy:  PAP:  Bone density:  Lipid panel:  Allergies  Allergen Reactions  . Ciprofloxacin Nausea Only    Current Outpatient Medications  Medication Sig Dispense Refill  . allopurinol (ZYLOPRIM) 300 MG tablet Take 1 tablet (300 mg total) by mouth daily. 30 tablet 0  .  ALPRAZolam (XANAX) 0.25 MG tablet Take 1 tablet (0.25 mg total) by mouth daily as needed for anxiety. 90 tablet 0  . aspirin 81 MG tablet Take 81 mg by mouth daily.    . cyanocobalamin (,VITAMIN B-12,) 1000 MCG/ML injection Inject into the muscle.    . diltiazem (TIAZAC) 240 MG 24 hr capsule Take by mouth.    . diphenhydrAMINE (SOMINEX) 25 MG tablet Take 25 mg by mouth at bedtime.    Marland Kitchen etodolac (LODINE) 400 MG tablet Take 400 mg by mouth daily.     . hydrALAZINE (APRESOLINE) 50 MG tablet Take 50 mg by mouth 2 (two) times daily.    Marland Kitchen HYDROcodone-acetaminophen (NORCO/VICODIN) 5-325 MG tablet Take 1-2 tablets by mouth every 4 (four) hours as needed for moderate pain or severe pain. 90 tablet 0  . hydroxyurea (HYDREA) 500 MG capsule Take 2 capsules (1,000 mg total) by mouth daily. May take with food to minimize GI side effects. 60 capsule 0  . lidocaine-prilocaine (EMLA) cream Apply 1 application topically as needed. Apply to port 1-2 hours prior to appointment. Cover with plastic wrap. 30 g 3  . lidocaine-prilocaine (EMLA) cream Apply to affected area once 30 g 3  . metoCLOPramide (REGLAN) 5 MG tablet 1 tab every morning, 1 tab bedtime    . mirtazapine (REMERON) 7.5 MG tablet Take 1 tablet (7.5 mg total) by mouth at bedtime. 30 tablet 2  . Multiple Vitamins-Minerals (PRESERVISION AREDS 2+MULTI VIT PO) Take 1 tablet by mouth 1 day or 1 dose.    Marland Kitchen omeprazole (PRILOSEC) 40 MG capsule Take by mouth.    . ondansetron (ZOFRAN) 8 MG tablet Take 1 tablet (8 mg total) by mouth every 8 (eight) hours as needed for nausea or vomiting. 20 tablet 0  . ondansetron (ZOFRAN) 8 MG tablet Take 1 tablet (8 mg total) by mouth 2 (two) times daily as needed for refractory nausea / vomiting. Start on day 2 after bendamustine chemo. 60 tablet 1  . prochlorperazine (COMPAZINE) 10 MG tablet Take 1 tablet (10 mg total) by mouth every 6 (six) hours as needed (Nausea or vomiting). 60 tablet 1  . tiZANidine (ZANAFLEX) 2 MG tablet Take 1 tablet (2 mg total) by mouth at bedtime as needed for muscle spasms. 30 tablet 0  . bumetanide (BUMEX) 1 MG tablet Take by mouth. (Patient not taking: No sig reported)    . diphenhydrAMINE (BENADRYL) 50 MG tablet Take 50 mg by mouth at bedtime as needed for itching. (Patient not taking: No sig reported)    . magnesium oxide (MAG-OX) 400 (241.3 Mg) MG tablet Take 1 tablet (400 mg total) by mouth daily. (Patient not taking: No sig reported) 30  tablet 0  . potassium chloride SA (KLOR-CON) 20 MEQ tablet Take 1 tablet (20 mEq total) by mouth daily. (Patient not taking: No sig reported) 10 tablet 0   No current facility-administered medications for this visit.   Facility-Administered Medications Ordered in Other Visits  Medication Dose Route Frequency Provider Last Rate Last Admin  . 0.9 %  sodium chloride infusion   Intravenous Once PRN Lloyd Huger, MD 999 mL/hr at 03/16/20 1225 New Bag at 03/16/20 1225  . bendamustine (BENDEKA) 100 mg in sodium chloride 0.9 % 50 mL (1.8519 mg/mL) chemo infusion  70 mg/m2 (Treatment Plan Recorded) Intravenous Once Lloyd Huger, MD      . heparin lock flush 100 unit/mL  500 Units Intravenous Once Lloyd Huger, MD      .  heparin lock flush 100 unit/mL  500 Units Intracatheter Once PRN Lloyd Huger, MD      . palonosetron (ALOXI) injection 0.25 mg  0.25 mg Intravenous Once Lloyd Huger, MD      . sodium chloride flush (NS) 0.9 % injection 10 mL  10 mL Intravenous PRN Lloyd Huger, MD   10 mL at 09/05/18 7062  . sodium chloride flush (NS) 0.9 % injection 10 mL  10 mL Intracatheter PRN Lloyd Huger, MD        OBJECTIVE: Vitals:   03/16/20 0851  BP: (!) 197/66  Pulse: 91  Resp: 20     Body mass index is 19.57 kg/m.    ECOG FS:1 - Symptomatic but completely ambulatory  General: Thin, no acute distress. Eyes: Pink conjunctiva, anicteric sclera. HEENT: Normocephalic, moist mucous membranes. Lungs: No audible wheezing or coughing. Heart: Regular rate and rhythm. Abdomen: Soft, nontender, no obvious distention. Musculoskeletal: No edema, cyanosis, or clubbing. Neuro: Alert, answering all questions appropriately. Cranial nerves grossly intact. Skin: No rashes or petechiae noted. Psych: Normal affect.   LAB RESULTS:  Lab Results  Component Value Date   NA 133 (L) 03/16/2020   K 3.7 03/16/2020   CL 102 03/16/2020   CO2 19 (L) 03/16/2020    GLUCOSE 145 (H) 03/16/2020   BUN 25 (H) 03/16/2020   CREATININE 0.72 03/16/2020   CALCIUM 9.0 03/16/2020   PROT 6.7 03/16/2020   ALBUMIN 4.1 03/16/2020   AST 32 03/16/2020   ALT 46 (H) 03/16/2020   ALKPHOS 108 03/16/2020   BILITOT 1.0 03/16/2020   GFRNONAA >60 03/16/2020   GFRAA >60 08/22/2019    Lab Results  Component Value Date   WBC 201.4 (HH) 03/16/2020   NEUTROABS 4.8 03/16/2020   HGB 6.0 (L) 03/16/2020   HCT 21.1 (L) 03/16/2020   MCV 106.0 (H) 03/16/2020   PLT 45 (L) 03/16/2020   Lab Results  Component Value Date   IRON 268 (H) 03/04/2020   TIBC 332 03/04/2020   IRONPCTSAT 81 (H) 03/04/2020   Lab Results  Component Value Date   FERRITIN 1,569 (H) 03/04/2020     STUDIES: CT BONE MARROW BIOPSY & ASPIRATION  Result Date: 02/26/2020 INDICATION: History of MDS now with worsening laboratories. Please perform CT-guided bone marrow biopsy for tissue diagnostic purposes. EXAM: CT-GUIDED BONE MARROW BIOPSY AND ASPIRATION MEDICATIONS: None ANESTHESIA/SEDATION: Fentanyl 50 mcg IV; Versed 1 mg IV Sedation Time: 10 Minutes; The patient was continuously monitored during the procedure by the interventional radiology nurse under my direct supervision. COMPLICATIONS: None immediate. PROCEDURE: Informed consent was obtained from the patient following an explanation of the procedure, risks, benefits and alternatives. The patient understands, agrees and consents for the procedure. All questions were addressed. A time out was performed prior to the initiation of the procedure. The patient was positioned prone and non-contrast localization CT was performed of the pelvis to demonstrate the iliac marrow spaces. The operative site was prepped and draped in the usual sterile fashion. Under sterile conditions and local anesthesia, a 22 gauge spinal needle was utilized for procedural planning. Next, an 11 gauge coaxial bone biopsy needle was advanced into the left iliac marrow space. Needle position  was confirmed with CT imaging. Initially, a bone marrow aspiration was performed. Next, a bone marrow biopsy was obtained with the 11 gauge outer bone marrow device. The 11 gauge coaxial bone biopsy needle was re-advanced into a slightly different location within the left iliac marrow space, positioning  was confirmed with CT imaging and an additional bone marrow biopsy was obtained. The needle was removed and superficial hemostasis was obtained with manual compression. A dressing was applied. The patient tolerated the procedure well without immediate post procedural complication. IMPRESSION: Successful CT guided left iliac bone marrow aspiration and core biopsy. Electronically Signed   By: Sandi Mariscal M.D.   On: 02/26/2020 11:36    ASSESSMENT: Stage IV mantle cell lymphoma.   PLAN:    1.  Stage IV mantle cell lymphoma: Case discussed with pathology confirming diagnosis.  Patient has been instructed to increase Hydrea to 1000 mg daily and increase allopurinol to 600 mg daily, although she admits she did not take Hydrea this past weekend since her pharmacy was closed.  She will benefit from treatment with Rituxan and Treanda on day 1 and then Treanda on day 2 for 28-day cycle.  Plan to do at least 6 cycles.  Given patient's thrombocytopenia, will dose reduce Treanda at the onset.  Proceed with cycle 1, day 1 of treatment today despite thrombocytopenia and anemia.  Return to clinic tomorrow for 1 unit of packed red blood cells as well as Treanda.  Patient will then return to clinic on Thursday for laboratory work only and then next week for laboratory work and further evaluation.   2.  MDS, 5q-: Treat mantle cell as above.  Patient also noted to have approximately 6% blasts on her bone marrow biopsy which is likely progression of her MDS. 3.  Elevated iron stores: Chronic and unchanged.  Patient continues to require Desferal with blood transfusions. 3.  CLL: Patient likely has transformed to mantle cell as  above.  Previously, all of her other labwork was either negative or within normal limits. Patient last received single agent Rituxan in February 2015.  4.  Anxiety: Chronic and unchanged.  Continue Xanax as needed.  Patient also receives 10 mg p.o. Valium with her treatments. 5.  Hypertension: Chronic and unchanged.  Unclear her compliance with her medications.  Continue follow-up and treatment per primary care. 6.  Back pain: Patient does not complain of this today. 7.  Macular degeneration: Continue follow-up with ophthalmology as needed. 8. Flank pain: Patient does not complain of this today.  Secondary to splenomegaly. 9.  Thrombocytopenia: Secondary to progressive disease.  Proceed cautiously with treatment as above. 10.  Anemia: Return to clinic tomorrow to receive 1 unit packed red blood cells. 11.  Reaction to Rituxan: Rate based.  Monitor.  Patient expressed understanding and was in agreement with this plan. She also understands that She can call clinic at any time with any questions, concerns, or complaints.   Lloyd Huger, MD   03/16/2020 12:56 PM

## 2020-03-16 ENCOUNTER — Encounter: Payer: Self-pay | Admitting: Oncology

## 2020-03-16 ENCOUNTER — Inpatient Hospital Stay (HOSPITAL_BASED_OUTPATIENT_CLINIC_OR_DEPARTMENT_OTHER): Payer: Medicare Other | Admitting: Hospice and Palliative Medicine

## 2020-03-16 ENCOUNTER — Inpatient Hospital Stay (HOSPITAL_BASED_OUTPATIENT_CLINIC_OR_DEPARTMENT_OTHER): Payer: Medicare Other | Admitting: Oncology

## 2020-03-16 ENCOUNTER — Other Ambulatory Visit: Payer: Self-pay | Admitting: *Deleted

## 2020-03-16 ENCOUNTER — Inpatient Hospital Stay: Payer: Medicare Other

## 2020-03-16 VITALS — BP 197/66 | HR 91 | Resp 20 | Wt 100.2 lb

## 2020-03-16 VITALS — BP 172/61 | HR 94 | Temp 98.8°F

## 2020-03-16 DIAGNOSIS — C8318 Mantle cell lymphoma, lymph nodes of multiple sites: Secondary | ICD-10-CM

## 2020-03-16 DIAGNOSIS — I6523 Occlusion and stenosis of bilateral carotid arteries: Secondary | ICD-10-CM

## 2020-03-16 DIAGNOSIS — Z5111 Encounter for antineoplastic chemotherapy: Secondary | ICD-10-CM | POA: Diagnosis not present

## 2020-03-16 DIAGNOSIS — Z515 Encounter for palliative care: Secondary | ICD-10-CM

## 2020-03-16 DIAGNOSIS — D46C Myelodysplastic syndrome with isolated del(5q) chromosomal abnormality: Secondary | ICD-10-CM

## 2020-03-16 LAB — CBC WITH DIFFERENTIAL/PLATELET
Abs Immature Granulocytes: 0.52 10*3/uL — ABNORMAL HIGH (ref 0.00–0.07)
Basophils Absolute: 0.7 10*3/uL — ABNORMAL HIGH (ref 0.0–0.1)
Basophils Relative: 0 %
Eosinophils Absolute: 1 10*3/uL — ABNORMAL HIGH (ref 0.0–0.5)
Eosinophils Relative: 1 %
HCT: 21.1 % — ABNORMAL LOW (ref 36.0–46.0)
Hemoglobin: 6 g/dL — ABNORMAL LOW (ref 12.0–15.0)
Immature Granulocytes: 0 %
Lymphocytes Relative: 95 %
Lymphs Abs: 190.3 10*3/uL — ABNORMAL HIGH (ref 0.7–4.0)
MCH: 30.2 pg (ref 26.0–34.0)
MCHC: 28.4 g/dL — ABNORMAL LOW (ref 30.0–36.0)
MCV: 106 fL — ABNORMAL HIGH (ref 80.0–100.0)
Monocytes Absolute: 4.2 10*3/uL — ABNORMAL HIGH (ref 0.1–1.0)
Monocytes Relative: 2 %
Neutro Abs: 4.8 10*3/uL (ref 1.7–7.7)
Neutrophils Relative %: 2 %
Platelets: 45 10*3/uL — ABNORMAL LOW (ref 150–400)
RBC: 1.99 MIL/uL — ABNORMAL LOW (ref 3.87–5.11)
RDW: 23.1 % — ABNORMAL HIGH (ref 11.5–15.5)
Smear Review: DECREASED
WBC: 201.4 10*3/uL (ref 4.0–10.5)
nRBC: 0 % (ref 0.0–0.2)

## 2020-03-16 LAB — COMPREHENSIVE METABOLIC PANEL
ALT: 46 U/L — ABNORMAL HIGH (ref 0–44)
AST: 32 U/L (ref 15–41)
Albumin: 4.1 g/dL (ref 3.5–5.0)
Alkaline Phosphatase: 108 U/L (ref 38–126)
Anion gap: 12 (ref 5–15)
BUN: 25 mg/dL — ABNORMAL HIGH (ref 8–23)
CO2: 19 mmol/L — ABNORMAL LOW (ref 22–32)
Calcium: 9 mg/dL (ref 8.9–10.3)
Chloride: 102 mmol/L (ref 98–111)
Creatinine, Ser: 0.72 mg/dL (ref 0.44–1.00)
GFR, Estimated: >60 mL/min (ref >60)
Glucose, Bld: 145 mg/dL — ABNORMAL HIGH (ref 70–99)
Potassium: 3.7 mmol/L (ref 3.5–5.1)
Sodium: 133 mmol/L — ABNORMAL LOW (ref 135–145)
Total Bilirubin: 1 mg/dL (ref 0.3–1.2)
Total Protein: 6.7 g/dL (ref 6.5–8.1)

## 2020-03-16 LAB — SAMPLE TO BLOOD BANK

## 2020-03-16 MED ORDER — FAMOTIDINE IN NACL 20-0.9 MG/50ML-% IV SOLN
20.0000 mg | Freq: Once | INTRAVENOUS | Status: AC | PRN
  Administered 2020-03-16: 20 mg via INTRAVENOUS

## 2020-03-16 MED ORDER — SODIUM CHLORIDE 0.9% FLUSH
10.0000 mL | INTRAVENOUS | Status: DC | PRN
  Filled 2020-03-16: qty 10

## 2020-03-16 MED ORDER — DIPHENHYDRAMINE HCL 25 MG PO CAPS
25.0000 mg | ORAL_CAPSULE | Freq: Once | ORAL | Status: AC
  Administered 2020-03-16: 25 mg via ORAL
  Filled 2020-03-16: qty 1

## 2020-03-16 MED ORDER — DIPHENHYDRAMINE HCL 50 MG/ML IJ SOLN
50.0000 mg | Freq: Once | INTRAMUSCULAR | Status: AC | PRN
  Administered 2020-03-16: 25 mg via INTRAVENOUS

## 2020-03-16 MED ORDER — HEPARIN SOD (PORK) LOCK FLUSH 100 UNIT/ML IV SOLN
500.0000 [IU] | Freq: Once | INTRAVENOUS | Status: AC | PRN
  Administered 2020-03-16: 500 [IU]
  Filled 2020-03-16: qty 5

## 2020-03-16 MED ORDER — MEPERIDINE HCL 25 MG/ML IJ SOLN
25.0000 mg | Freq: Once | INTRAMUSCULAR | Status: AC
  Administered 2020-03-16: 25 mg via INTRAVENOUS
  Filled 2020-03-16: qty 1

## 2020-03-16 MED ORDER — ACETAMINOPHEN 325 MG PO TABS
650.0000 mg | ORAL_TABLET | Freq: Once | ORAL | Status: AC
  Administered 2020-03-16: 650 mg via ORAL
  Filled 2020-03-16: qty 2

## 2020-03-16 MED ORDER — SODIUM CHLORIDE 0.9 % IV SOLN
Freq: Once | INTRAVENOUS | Status: AC
  Filled 2020-03-16: qty 250

## 2020-03-16 MED ORDER — DIAZEPAM 5 MG PO TABS
10.0000 mg | ORAL_TABLET | Freq: Once | ORAL | Status: AC
  Administered 2020-03-16: 10 mg via ORAL
  Filled 2020-03-16: qty 2

## 2020-03-16 MED ORDER — METHYLPREDNISOLONE SODIUM SUCC 125 MG IJ SOLR
125.0000 mg | Freq: Once | INTRAMUSCULAR | Status: AC | PRN
  Administered 2020-03-16: 125 mg via INTRAVENOUS

## 2020-03-16 MED ORDER — SODIUM CHLORIDE 0.9 % IV SOLN
Freq: Once | INTRAVENOUS | Status: DC | PRN
  Filled 2020-03-16: qty 250

## 2020-03-16 MED ORDER — SODIUM CHLORIDE 0.9 % IV SOLN
375.0000 mg/m2 | Freq: Once | INTRAVENOUS | Status: AC
  Administered 2020-03-16: 500 mg via INTRAVENOUS
  Filled 2020-03-16: qty 50

## 2020-03-16 MED ORDER — SODIUM CHLORIDE 0.9 % IV SOLN
70.0000 mg/m2 | Freq: Once | INTRAVENOUS | Status: AC
  Administered 2020-03-16: 100 mg via INTRAVENOUS
  Filled 2020-03-16: qty 4

## 2020-03-16 MED ORDER — PALONOSETRON HCL INJECTION 0.25 MG/5ML
0.2500 mg | Freq: Once | INTRAVENOUS | Status: AC
  Administered 2020-03-16: 0.25 mg via INTRAVENOUS
  Filled 2020-03-16: qty 5

## 2020-03-16 MED ORDER — SODIUM CHLORIDE 0.9 % IV SOLN
10.0000 mg | Freq: Once | INTRAVENOUS | Status: AC
  Administered 2020-03-16: 10 mg via INTRAVENOUS
  Filled 2020-03-16: qty 10

## 2020-03-16 NOTE — Progress Notes (Signed)
Rituxan infusion started and titrated per policy.  At 1225, pt was due for her 4th rate increase and pt returned from the restroom and reported being cold and had rigors.  BP was 192/65 and HR 105. Pt noted to be 85% on room air.  O2 was initiated via nasal cannula and increased to 4L to maintain sats above 92%.  No increased breathing or shortness of breath reported by patient.  MD to chairside.  Pt given benadryl, solumedrol, pepcid, and demerol IV.  Pt got 30 minutes of IVF and returned to baseline.  Rituxan infusion restarted at previous rate and titrated according to policy for the remainder of the infusion.  Pt had no further complaints and was able to finish her rituxan infusion.  Pt left chemo suite stable in a wheelchair to return tomorrow for day 2 of treatment.

## 2020-03-16 NOTE — Progress Notes (Signed)
Hgb 6, ok to proceed with tx today despite labs and BP per MD.

## 2020-03-16 NOTE — Progress Notes (Signed)
Jacqueline Russo  [phone] [phone]   Name: Jacqueline Russo Date: 03/16/2020 MRN: [id number]  DOB: 1929-10-15  Patient Care Team: [name], MD as PCP - General (Internal Medicine) Lloyd Huger, MD as Consulting Physician (Hematology and Oncology)    REASON FOR CONSULTATION: Jacqueline Russo is a 86 y.o. 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  She was referred to palliative care to help address goals and manage any symptomatic complaints.  SOCIAL HISTORY:     reports that she quit smoking about 59 years ago. Her smoking use included cigarettes. She smoked 2.00 packs per day. She has never used smokeless tobacco. She reports previous alcohol use of about 1.0 standard drink of alcohol per week. She reports that she does not use drugs.   Patient is widowed.  She lives at the [place].  Patient has a son in [place] and another son in [place].  She has a sister who lives nearby.  Patient is originally from [place] and then lived in [place] prior to immigrating to the [place].  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  ADVANCE DIRECTIVES:  On file  CODE STATUS: DNR/DNI (MOST form completed on 05/23/19)  PAST MEDICAL HISTORY: Past Medical History:  Diagnosis Date  . Anemia    Myelodysplastic syndrome  . Arthritis   . Hypertension   . Leukemia, chronic lymphoid (HCC)    CLL  . Ovarian cancer ([place]) 1963    PAST SURGICAL HISTORY:  Past Surgical History:  Procedure Laterality Date  . ABDOMINAL HYSTERECTOMY    . APPENDECTOMY    . BREAST SURGERY     cyst removed from right breast  . EYE SURGERY Bilateral    Cataract Extractions  . IR FLUORO GUIDE CV LINE RIGHT  06/30/2017  . REVERSE SHOULDER ARTHROPLASTY Right 10/28/2014   Procedure: REVERSE SHOULDER ARTHROPLASTY;  Surgeon: Corky Mull, MD;  Location: [facility] ORS;  Service: Orthopedics;  Laterality: Right;    HEMATOLOGY/ONCOLOGY HISTORY:  Oncology History Overview Note  History of MDS, 5q deletion.  Patient refused treatment with Revlimid.  Underwent a bone marrow biopsy on 06/30/2017 which showed hypercellular marrow 50% with dyspoiesis and increased blasts ~6% consistent with myelodysplastic syndrome.  Given history of MDS with isolated deletion (5q), presence of increased blasts likely indicative of progressive disease.  Now transfusion dependent.  She continued to refuse Revlimid.  Previously had discussed possibility of progressing to AML but patient declined further interventions beyond periodic blood transfusions.   History of iron overload and she receives Desferal prior to transfusions.   Patient has history of CLL and received single agent Rituxan in February 2015.  Previous bone marrow biopsy did not mention any involvement of CLL.    Oncology History     CLL (chronic lymphocytic leukemia) ([place])  11/16/2015 Initial Diagnosis   CLL (chronic lymphocytic leukemia) ([place])   Mantle cell lymphoma ([place])  03/05/2020 Initial Diagnosis   Mantle cell lymphoma ([place])   03/05/2020 Cancer Staging   Staging form: Hodgkin and Non-Hodgkin Lymphoma, AJCC 8th Edition - Clinical stage from 03/05/2020: Stage IV (Mantle cell lymphoma) - Signed by Lloyd Huger, MD on 03/05/2020   03/16/2020 -  Chemotherapy   The patient had palonosetron (ALOXI) injection 0.25 mg, 0.25 mg, Intravenous,  Once, 1 of 6 cycles bendamustine (BENDEKA) 100 mg in sodium chloride  0.9 % 50 mL (1.8519 mg/mL) chemo infusion, 70 mg/m2 = 100 mg (100 % of original dose 70 mg/m2), Intravenous,  Once, 1 of 6 cycles Dose modification: 70 mg/m2 (original dose 70 mg/m2, Cycle 1, Reason: Provider Judgment) riTUXimab-pvvr (RUXIENCE) 500 mg in sodium chloride 0.9 % 250 mL (1.6667 mg/mL) infusion, 375 mg/m2 = 500 mg, Intravenous,  Once, 1 of 6  cycles Administration: 500 mg (03/16/2020)  for chemotherapy treatment.      ALLERGIES:  is allergic to ciprofloxacin.  MEDICATIONS:  Current Outpatient Medications  Medication Sig Dispense Refill  . allopurinol (ZYLOPRIM) 300 MG tablet Take 1 tablet (300 mg total) by mouth daily. 30 tablet 0  . ALPRAZolam (XANAX) 0.25 MG tablet Take 1 tablet (0.25 mg total) by mouth daily as needed for anxiety. 90 tablet 0  . aspirin 81 MG tablet Take 81 mg by mouth daily.    . bumetanide (BUMEX) 1 MG tablet Take by mouth. (Patient not taking: No sig reported)    . cyanocobalamin (,VITAMIN B-12,) 1000 MCG/ML injection Inject into the muscle.    . diltiazem (TIAZAC) 240 MG 24 hr capsule Take by mouth.    . diphenhydrAMINE (BENADRYL) 50 MG tablet Take 50 mg by mouth at bedtime as needed for itching. (Patient not taking: No sig reported)    . diphenhydrAMINE (SOMINEX) 25 MG tablet Take 25 mg by mouth at bedtime.    Marland Kitchen etodolac (LODINE) 400 MG tablet Take 400 mg by mouth daily.    . hydrALAZINE (APRESOLINE) 50 MG tablet Take 50 mg by mouth 2 (two) times daily.    Marland Kitchen HYDROcodone-acetaminophen (NORCO/VICODIN) 5-325 MG tablet Take 1-2 tablets by mouth every 4 (four) hours as needed for moderate pain or severe pain. 90 tablet 0  . hydroxyurea (HYDREA) 500 MG capsule Take 2 capsules (1,000 mg total) by mouth daily. May take with food to minimize GI side effects. 60 capsule 0  . lidocaine-prilocaine (EMLA) cream Apply 1 application topically as needed. Apply to port 1-2 hours prior to appointment. Cover with plastic wrap. 30 g 3  . lidocaine-prilocaine (EMLA) cream Apply to affected area once 30 g 3  . magnesium oxide (MAG-OX) 400 (241.3 Mg) MG tablet Take 1 tablet (400 mg total) by mouth daily. (Patient not taking: No sig reported) 30 tablet 0  . metoCLOPramide (REGLAN) 5 MG tablet 1 tab every morning, 1 tab bedtime    . mirtazapine (REMERON) 7.5 MG tablet Take 1 tablet (7.5 mg total) by mouth at bedtime. 30 tablet  2  . Multiple Vitamins-Minerals (PRESERVISION AREDS 2+MULTI VIT PO) Take 1 tablet by mouth 1 day or 1 dose.    Marland Kitchen omeprazole (PRILOSEC) 40 MG capsule Take by mouth.    . ondansetron (ZOFRAN) 8 MG tablet Take 1 tablet (8 mg total) by mouth every 8 (eight) hours as needed for nausea or vomiting. 20 tablet 0  . ondansetron (ZOFRAN) 8 MG tablet Take 1 tablet (8 mg total) by mouth 2 (two) times daily as needed for refractory nausea / vomiting. Start on day 2 after bendamustine chemo. 60 tablet 1  . potassium chloride SA (KLOR-CON) 20 MEQ tablet Take 1 tablet (20 mEq total) by mouth daily. (Patient not taking: No sig reported) 10 tablet 0  . prochlorperazine (COMPAZINE) 10 MG tablet Take 1 tablet (10 mg total) by mouth every 6 (six) hours as needed (Nausea or vomiting). 60 tablet 1  . tiZANidine (ZANAFLEX) 2 MG tablet Take 1 tablet (2 mg total) by mouth at  bedtime as needed for muscle spasms. 30 tablet 0   No current facility-administered medications for this visit.   Facility-Administered Medications Ordered in Other Visits  Medication Dose Route Frequency Provider Last Rate Last Admin  . 0.9 %  sodium chloride infusion   Intravenous Once PRN Lloyd Huger, MD 999 mL/hr at 03/16/20 1225 New Bag at 03/16/20 1225  . bendamustine (BENDEKA) 100 mg in sodium chloride 0.9 % 50 mL (1.8519 mg/mL) chemo infusion  70 mg/m2 (Treatment Plan Recorded) Intravenous Once Lloyd Huger, MD      . heparin lock flush 100 unit/mL  500 Units Intravenous Once Lloyd Huger, MD      . heparin lock flush 100 unit/mL  500 Units Intracatheter Once PRN Lloyd Huger, MD      . palonosetron (ALOXI) injection 0.25 mg  0.25 mg Intravenous Once Lloyd Huger, MD      . sodium chloride flush (NS) 0.9 % injection 10 mL  10 mL Intravenous PRN Lloyd Huger, MD   10 mL at 09/05/18 2233  . sodium chloride flush (NS) 0.9 % injection 10 mL  10 mL Intracatheter PRN Lloyd Huger, MD        VITAL  SIGNS: There were no vitals taken for this visit. There were no vitals filed for this visit.  Estimated body mass index is 19.57 kg/m as calculated from the following:   Height as of 02/26/20: 5' (1.524 m).   Weight as of an earlier encounter on 03/16/20: 100 lb 3.2 oz (45.5 kg).  LABS: CBC:    Component Value Date/Time   WBC 201.4 (HH) 03/16/2020 0828   HGB 6.0 (L) 03/16/2020 0828   HGB 11.6 (L) 01/06/2014 0828   HCT 21.1 (L) 03/16/2020 0828   HCT 32.7 (L) 01/06/2014 0828   PLT 45 (L) 03/16/2020 0828   PLT 176 01/06/2014 0828   MCV 106.0 (H) 03/16/2020 0828   MCV 103 (H) 01/06/2014 0828   NEUTROABS 4.8 03/16/2020 0828   NEUTROABS 1.6 01/06/2014 0828   LYMPHSABS 190.3 (H) 03/16/2020 0828   LYMPHSABS 1.4 01/06/2014 0828   MONOABS 4.2 (H) 03/16/2020 0828   MONOABS 0.3 01/06/2014 0828   EOSABS 1.0 (H) 03/16/2020 0828   EOSABS 0.1 01/06/2014 0828   BASOSABS 0.7 (H) 03/16/2020 0828   BASOSABS 0.1 01/06/2014 0828   Comprehensive Metabolic Panel:    Component Value Date/Time   NA 133 (L) 03/16/2020 0828   NA 140 06/26/2013 1044   K 3.7 03/16/2020 0828   K 3.7 06/26/2013 1044   CL 102 03/16/2020 0828   CL 102 06/26/2013 1044   CO2 19 (L) 03/16/2020 0828   CO2 32 06/26/2013 1044   BUN 25 (H) 03/16/2020 0828   BUN 16 06/26/2013 1044   CREATININE 0.72 03/16/2020 0828   CREATININE 1.01 01/06/2014 0828   GLUCOSE 145 (H) 03/16/2020 0828   GLUCOSE 121 (H) 06/26/2013 1044   CALCIUM 9.0 03/16/2020 0828   CALCIUM 9.0 06/26/2013 1044   AST 32 03/16/2020 0828   AST 12 (L) 03/27/2013 0955   ALT 46 (H) 03/16/2020 0828   ALT 20 03/27/2013 0955   ALKPHOS 108 03/16/2020 0828   ALKPHOS 87 03/27/2013 0955   BILITOT 1.0 03/16/2020 0828   BILITOT 0.5 03/27/2013 0955   PROT 6.7 03/16/2020 0828   PROT 7.3 03/27/2013 0955   ALBUMIN 4.1 03/16/2020 0828   ALBUMIN 4.1 03/27/2013 0955    RADIOGRAPHIC STUDIES: CT BONE MARROW BIOPSY & ASPIRATION  Result Date: 02/26/2020 INDICATION:  History of MDS now with worsening laboratories. Please perform CT-guided bone marrow biopsy for tissue diagnostic purposes. EXAM: CT-GUIDED BONE MARROW BIOPSY AND ASPIRATION MEDICATIONS: None ANESTHESIA/SEDATION: Fentanyl 50 mcg IV; Versed 1 mg IV Sedation Time: 10 Minutes; The patient was continuously monitored during the procedure by the interventional radiology nurse under my direct supervision. COMPLICATIONS: None immediate. PROCEDURE: Informed consent was obtained from the patient following an explanation of the procedure, risks, benefits and alternatives. The patient understands, agrees and consents for the procedure. All questions were addressed. A time out was performed prior to the initiation of the procedure. The patient was positioned prone and non-contrast localization CT was performed of the pelvis to demonstrate the iliac marrow spaces. The operative site was prepped and draped in the usual sterile fashion. Under sterile conditions and local anesthesia, a 22 gauge spinal needle was utilized for procedural planning. Next, an 11 gauge coaxial bone biopsy needle was advanced into the left iliac marrow space. Needle position was confirmed with CT imaging. Initially, a bone marrow aspiration was performed. Next, a bone marrow biopsy was obtained with the 11 gauge outer bone marrow device. The 11 gauge coaxial bone biopsy needle was re-advanced into a slightly different location within the left iliac marrow space, positioning was confirmed with CT imaging and an additional bone marrow biopsy was obtained. The needle was removed and superficial hemostasis was obtained with manual compression. A dressing was applied. The patient tolerated the procedure well without immediate post procedural complication. IMPRESSION: Successful CT guided left iliac bone marrow aspiration and core biopsy. Electronically Signed   By: Sandi Mariscal M.D.   On: 02/26/2020 11:36    PERFORMANCE STATUS (ECOG) : 1 - Symptomatic but  completely ambulatory  Review of Systems Unless otherwise noted, a complete review of systems is negative.  Physical Exam General: NAD Pulmonary: Unlabored Abdomen: soft, nontender, + bowel sounds GU: no suprapubic tenderness Extremities: Trace bilateral lower edema, no joint deformities Skin: no rashes Neurological: Weakness but otherwise nonfocal  IMPRESSION: Routine follow-up visit today.  Patient was seen in infusion.  Patient has started systemic treatment with Rituxan/bendamustine.  Patient had an infusion reaction with treatment earlier but seems to be tolerating now.  Patient had some questions about her medications today.  She says that she dropped her pillbox at home and is unsure if she will need refills of medications.  She intends to speak with Dr. Sabra Heck about this as well.  I suggested that she consider bringing her medications into the clinic so that we can see what she needs.  Patient says that she is hopeful that she will live another year or two on treatment.  She says that she is not ready to "die yet."  In the immediate sense, her goal is to be stable enough to travel to [place] next month to be with her son as he undergoes heart surgery.  PLAN: -Best supportive care -Continue mirtazapine 7.5 mg nightly as needed -Refill Norco -DNR/DNI -RTC tomorrow  Case and plan discussed in detail with Dr. Grayland Ormond   Patient expressed understanding and was in agreement with this plan. She also understands that She can call the clinic at any time with any questions, concerns, or complaints.     Time Total: 20 minutes  Visit consisted of counseling and education dealing with the complex and emotionally intense issues of symptom management and palliative care in the setting of serious and potentially life-threatening illness.Greater than 50%  of this  time was spent counseling and coordinating care related to the above assessment and plan.  Signed by: Altha Harm, PhD,  NP-C

## 2020-03-16 NOTE — Progress Notes (Signed)
Patient here today for follow up and treatment consideration regarding lymphoma. Patient denies concerns today.

## 2020-03-17 ENCOUNTER — Other Ambulatory Visit: Payer: Self-pay | Admitting: Oncology

## 2020-03-17 ENCOUNTER — Telehealth: Payer: Self-pay

## 2020-03-17 ENCOUNTER — Inpatient Hospital Stay: Payer: Medicare Other

## 2020-03-17 VITALS — BP 169/70 | HR 100 | Temp 98.0°F | Resp 16

## 2020-03-17 DIAGNOSIS — M25531 Pain in right wrist: Secondary | ICD-10-CM | POA: Diagnosis not present

## 2020-03-17 DIAGNOSIS — C8318 Mantle cell lymphoma, lymph nodes of multiple sites: Secondary | ICD-10-CM

## 2020-03-17 DIAGNOSIS — R197 Diarrhea, unspecified: Secondary | ICD-10-CM | POA: Diagnosis not present

## 2020-03-17 DIAGNOSIS — Z8543 Personal history of malignant neoplasm of ovary: Secondary | ICD-10-CM | POA: Diagnosis not present

## 2020-03-17 DIAGNOSIS — M549 Dorsalgia, unspecified: Secondary | ICD-10-CM | POA: Diagnosis not present

## 2020-03-17 DIAGNOSIS — M79641 Pain in right hand: Secondary | ICD-10-CM | POA: Diagnosis not present

## 2020-03-17 DIAGNOSIS — G8929 Other chronic pain: Secondary | ICD-10-CM | POA: Diagnosis not present

## 2020-03-17 DIAGNOSIS — R5381 Other malaise: Secondary | ICD-10-CM | POA: Diagnosis not present

## 2020-03-17 DIAGNOSIS — D638 Anemia in other chronic diseases classified elsewhere: Secondary | ICD-10-CM | POA: Diagnosis not present

## 2020-03-17 DIAGNOSIS — Z5111 Encounter for antineoplastic chemotherapy: Secondary | ICD-10-CM | POA: Diagnosis not present

## 2020-03-17 DIAGNOSIS — R5383 Other fatigue: Secondary | ICD-10-CM | POA: Diagnosis not present

## 2020-03-17 DIAGNOSIS — C9110 Chronic lymphocytic leukemia of B-cell type not having achieved remission: Secondary | ICD-10-CM | POA: Diagnosis not present

## 2020-03-17 DIAGNOSIS — Z7982 Long term (current) use of aspirin: Secondary | ICD-10-CM | POA: Diagnosis not present

## 2020-03-17 DIAGNOSIS — I10 Essential (primary) hypertension: Secondary | ICD-10-CM | POA: Diagnosis not present

## 2020-03-17 DIAGNOSIS — R410 Disorientation, unspecified: Secondary | ICD-10-CM | POA: Diagnosis not present

## 2020-03-17 DIAGNOSIS — Z79899 Other long term (current) drug therapy: Secondary | ICD-10-CM | POA: Diagnosis not present

## 2020-03-17 DIAGNOSIS — Z515 Encounter for palliative care: Secondary | ICD-10-CM | POA: Diagnosis not present

## 2020-03-17 DIAGNOSIS — D6959 Other secondary thrombocytopenia: Secondary | ICD-10-CM | POA: Diagnosis not present

## 2020-03-17 LAB — COMPREHENSIVE METABOLIC PANEL
ALT: 52 U/L — ABNORMAL HIGH (ref 0–44)
AST: 46 U/L — ABNORMAL HIGH (ref 15–41)
Albumin: 3.9 g/dL (ref 3.5–5.0)
Alkaline Phosphatase: 115 U/L (ref 38–126)
Anion gap: 17 — ABNORMAL HIGH (ref 5–15)
BUN: 26 mg/dL — ABNORMAL HIGH (ref 8–23)
CO2: 12 mmol/L — ABNORMAL LOW (ref 22–32)
Calcium: 8.4 mg/dL — ABNORMAL LOW (ref 8.9–10.3)
Chloride: 102 mmol/L (ref 98–111)
Creatinine, Ser: 0.88 mg/dL (ref 0.44–1.00)
GFR, Estimated: >60 mL/min (ref >60)
Glucose, Bld: 229 mg/dL — ABNORMAL HIGH (ref 70–99)
Potassium: 4.1 mmol/L (ref 3.5–5.1)
Sodium: 131 mmol/L — ABNORMAL LOW (ref 135–145)
Total Bilirubin: 0.8 mg/dL (ref 0.3–1.2)
Total Protein: 6.4 g/dL — ABNORMAL LOW (ref 6.5–8.1)

## 2020-03-17 LAB — HEPATITIS B SURFACE ANTIGEN: Hepatitis B Surface Ag: NEGATIVE — AB

## 2020-03-17 LAB — CBC WITH DIFFERENTIAL/PLATELET
Abs Immature Granulocytes: 0.77 10*3/uL — ABNORMAL HIGH (ref 0.00–0.07)
Basophils Absolute: 0.7 10*3/uL — ABNORMAL HIGH (ref 0.0–0.1)
Basophils Relative: 2 %
Eosinophils Absolute: 0.3 10*3/uL (ref 0.0–0.5)
Eosinophils Relative: 1 %
HCT: 17 % — ABNORMAL LOW (ref 36.0–46.0)
Hemoglobin: 5.4 g/dL — ABNORMAL LOW (ref 12.0–15.0)
Immature Granulocytes: 2 %
Lymphocytes Relative: 72 %
Lymphs Abs: 32.5 10*3/uL — ABNORMAL HIGH (ref 0.7–4.0)
MCH: 31.8 pg (ref 26.0–34.0)
MCHC: 31.8 g/dL (ref 30.0–36.0)
MCV: 100 fL (ref 80.0–100.0)
Monocytes Absolute: 2.3 10*3/uL — ABNORMAL HIGH (ref 0.1–1.0)
Monocytes Relative: 5 %
Neutro Abs: 8.1 10*3/uL — ABNORMAL HIGH (ref 1.7–7.7)
Neutrophils Relative %: 18 %
Platelets: 10 10*3/uL — CL (ref 150–400)
RBC: 1.7 MIL/uL — ABNORMAL LOW (ref 3.87–5.11)
RDW: 19.4 % — ABNORMAL HIGH (ref 11.5–15.5)
Smear Review: NORMAL
WBC: 44.7 10*3/uL — ABNORMAL HIGH (ref 4.0–10.5)
nRBC: 0.1 % (ref 0.0–0.2)

## 2020-03-17 LAB — HEPATITIS B CORE ANTIBODY, TOTAL: Hep B Core Total Ab: NEGATIVE — AB

## 2020-03-17 LAB — MAGNESIUM: Magnesium: 1.9 mg/dL (ref 1.7–2.4)

## 2020-03-17 LAB — PLATELET COUNT: Platelets: 47 10*3/uL — ABNORMAL LOW (ref 150–400)

## 2020-03-17 LAB — PHOSPHORUS: Phosphorus: 3.7 mg/dL (ref 2.5–4.6)

## 2020-03-17 LAB — URIC ACID: Uric Acid, Serum: 4.2 mg/dL (ref 2.5–7.1)

## 2020-03-17 LAB — LACTATE DEHYDROGENASE: LDH: 358 U/L — ABNORMAL HIGH (ref 98–192)

## 2020-03-17 LAB — PREPARE RBC (CROSSMATCH)

## 2020-03-17 MED ORDER — HEPARIN SOD (PORK) LOCK FLUSH 100 UNIT/ML IV SOLN
INTRAVENOUS | Status: AC
  Filled 2020-03-17: qty 5

## 2020-03-17 MED ORDER — SODIUM CHLORIDE 0.9% IV SOLUTION
250.0000 mL | Freq: Once | INTRAVENOUS | Status: AC
  Administered 2020-03-17: 250 mL via INTRAVENOUS
  Filled 2020-03-17: qty 250

## 2020-03-17 MED ORDER — ACETAMINOPHEN 325 MG PO TABS
650.0000 mg | ORAL_TABLET | Freq: Once | ORAL | Status: AC
  Administered 2020-03-17: 650 mg via ORAL
  Filled 2020-03-17: qty 2

## 2020-03-17 MED ORDER — SODIUM CHLORIDE 0.9 % IV SOLN
10.0000 mg | Freq: Once | INTRAVENOUS | Status: AC
  Administered 2020-03-17: 10 mg via INTRAVENOUS
  Filled 2020-03-17: qty 10

## 2020-03-17 MED ORDER — SODIUM CHLORIDE 0.9 % IV SOLN
Freq: Once | INTRAVENOUS | Status: AC
  Filled 2020-03-17: qty 250

## 2020-03-17 MED ORDER — DIPHENHYDRAMINE HCL 50 MG/ML IJ SOLN
25.0000 mg | Freq: Once | INTRAMUSCULAR | Status: AC
  Administered 2020-03-17: 25 mg via INTRAVENOUS
  Filled 2020-03-17: qty 1

## 2020-03-17 MED ORDER — SODIUM CHLORIDE 0.9 % IV SOLN
70.0000 mg/m2 | Freq: Once | INTRAVENOUS | Status: AC
  Administered 2020-03-17: 100 mg via INTRAVENOUS
  Filled 2020-03-17: qty 4

## 2020-03-17 MED ORDER — HEPARIN SOD (PORK) LOCK FLUSH 100 UNIT/ML IV SOLN
500.0000 [IU] | Freq: Once | INTRAVENOUS | Status: AC | PRN
Start: 2020-03-17 — End: 2020-03-17
  Administered 2020-03-17: 500 [IU]
  Filled 2020-03-17: qty 5

## 2020-03-17 MED ORDER — DIAZEPAM 5 MG PO TABS
10.0000 mg | ORAL_TABLET | Freq: Once | ORAL | Status: AC
  Administered 2020-03-17: 10 mg via ORAL
  Filled 2020-03-17: qty 2

## 2020-03-17 NOTE — Telephone Encounter (Signed)
Telephone call to patient for follow up after receiving first infusion.   No answer but left message stating we were calling to check on them.  Encouraged patient to call for any questions or concerns.   

## 2020-03-17 NOTE — Progress Notes (Signed)
Mrs. Friedland received 1 unit of PRBC's and 1 unit of platelets today. Patient only received one unit of platelets since her platelet count increased to 47 after her first unit per Dr. Orlie Dakin. Dr. Orlie Dakin notified of patient O2 sats dropping to 73% but slowly came back up to 88%. Patient did not complain of shortness of breath or any other complications. Per Dr. Orlie Dakin resume with blood transfusion.

## 2020-03-18 ENCOUNTER — Inpatient Hospital Stay: Payer: Medicare Other

## 2020-03-19 ENCOUNTER — Inpatient Hospital Stay: Payer: Medicare Other

## 2020-03-19 ENCOUNTER — Other Ambulatory Visit: Payer: Self-pay | Admitting: *Deleted

## 2020-03-19 ENCOUNTER — Other Ambulatory Visit: Payer: Self-pay

## 2020-03-19 ENCOUNTER — Ambulatory Visit
Admission: RE | Admit: 2020-03-19 | Discharge: 2020-03-19 | Disposition: A | Discharge status: Home / Self Care | Payer: Medicare Other | Source: Ambulatory Visit | Attending: Nurse Practitioner | Admitting: Nurse Practitioner

## 2020-03-19 ENCOUNTER — Other Ambulatory Visit: Payer: Self-pay | Admitting: Oncology

## 2020-03-19 ENCOUNTER — Ambulatory Visit
Admission: RE | Admit: 2020-03-19 | Discharge: 2020-03-19 | Disposition: A | Discharge status: Home / Self Care | Payer: Medicare Other | Source: Ambulatory Visit | Attending: Oncology | Admitting: Oncology

## 2020-03-19 ENCOUNTER — Inpatient Hospital Stay (HOSPITAL_BASED_OUTPATIENT_CLINIC_OR_DEPARTMENT_OTHER): Payer: Medicare Other | Admitting: Nurse Practitioner

## 2020-03-19 ENCOUNTER — Encounter: Payer: Self-pay | Admitting: Nurse Practitioner

## 2020-03-19 VITALS — BP 161/69 | HR 92 | Temp 97.5°F | Resp 18

## 2020-03-19 VITALS — BP 180/74 | HR 99 | Temp 99.0°F | Resp 16 | Ht 60.0 in | Wt 98.0 lb

## 2020-03-19 DIAGNOSIS — C8318 Mantle cell lymphoma, lymph nodes of multiple sites: Secondary | ICD-10-CM

## 2020-03-19 DIAGNOSIS — M79641 Pain in right hand: Secondary | ICD-10-CM

## 2020-03-19 DIAGNOSIS — R410 Disorientation, unspecified: Secondary | ICD-10-CM

## 2020-03-19 DIAGNOSIS — R197 Diarrhea, unspecified: Secondary | ICD-10-CM

## 2020-03-19 DIAGNOSIS — Z5111 Encounter for antineoplastic chemotherapy: Secondary | ICD-10-CM | POA: Diagnosis not present

## 2020-03-19 DIAGNOSIS — D46C Myelodysplastic syndrome with isolated del(5q) chromosomal abnormality: Secondary | ICD-10-CM

## 2020-03-19 LAB — CBC WITH DIFFERENTIAL/PLATELET
Abs Immature Granulocytes: 0.51 10*3/uL — ABNORMAL HIGH (ref 0.00–0.07)
Basophils Absolute: 0.3 10*3/uL — ABNORMAL HIGH (ref 0.0–0.1)
Basophils Relative: 0 %
Eosinophils Absolute: 0.7 10*3/uL — ABNORMAL HIGH (ref 0.0–0.5)
Eosinophils Relative: 1 %
HCT: 23.7 % — ABNORMAL LOW (ref 36.0–46.0)
Hemoglobin: 7.6 g/dL — ABNORMAL LOW (ref 12.0–15.0)
Immature Granulocytes: 1 %
Lymphocytes Relative: 89 %
Lymphs Abs: 97.2 10*3/uL — ABNORMAL HIGH (ref 0.7–4.0)
MCH: 30.9 pg (ref 26.0–34.0)
MCHC: 32.1 g/dL (ref 30.0–36.0)
MCV: 96.3 fL (ref 80.0–100.0)
Monocytes Absolute: 3.6 10*3/uL — ABNORMAL HIGH (ref 0.1–1.0)
Monocytes Relative: 3 %
Neutro Abs: 6 10*3/uL (ref 1.7–7.7)
Neutrophils Relative %: 6 %
Platelets: 46 10*3/uL — ABNORMAL LOW (ref 150–400)
RBC: 2.46 MIL/uL — ABNORMAL LOW (ref 3.87–5.11)
RDW: 18.6 % — ABNORMAL HIGH (ref 11.5–15.5)
Smear Review: NORMAL
WBC: 108.3 10*3/uL (ref 4.0–10.5)
nRBC: 0 % (ref 0.0–0.2)

## 2020-03-19 LAB — COMPREHENSIVE METABOLIC PANEL
ALT: 65 U/L — ABNORMAL HIGH (ref 0–44)
AST: 33 U/L (ref 15–41)
Albumin: 4.2 g/dL (ref 3.5–5.0)
Alkaline Phosphatase: 117 U/L (ref 38–126)
Anion gap: 13 (ref 5–15)
BUN: 22 mg/dL (ref 8–23)
CO2: 20 mmol/L — ABNORMAL LOW (ref 22–32)
Calcium: 8.4 mg/dL — ABNORMAL LOW (ref 8.9–10.3)
Chloride: 104 mmol/L (ref 98–111)
Creatinine, Ser: 0.65 mg/dL (ref 0.44–1.00)
GFR, Estimated: >60 mL/min (ref >60)
Glucose, Bld: 137 mg/dL — ABNORMAL HIGH (ref 70–99)
Potassium: 3 mmol/L — ABNORMAL LOW (ref 3.5–5.1)
Sodium: 137 mmol/L (ref 135–145)
Total Bilirubin: 1.4 mg/dL — ABNORMAL HIGH (ref 0.3–1.2)
Total Protein: 6.7 g/dL (ref 6.5–8.1)

## 2020-03-19 LAB — TYPE AND SCREEN
ABO/RH(D): AB POS
Antibody Screen: NEGATIVE
Unit division: 0

## 2020-03-19 LAB — PREPARE RBC (CROSSMATCH)

## 2020-03-19 LAB — BPAM RBC
Blood Product Expiration Date: 202201162359
ISSUE DATE / TIME: 202112281303
Unit Type and Rh: 6200

## 2020-03-19 LAB — MAGNESIUM: Magnesium: 1.8 mg/dL (ref 1.7–2.4)

## 2020-03-19 LAB — SAMPLE TO BLOOD BANK

## 2020-03-19 MED ORDER — LOPERAMIDE HCL 2 MG PO TABS: 2.0000 mg | ORAL_TABLET | Freq: Four times a day (QID) | ORAL | 0 refills | Status: AC | PRN

## 2020-03-19 MED ORDER — DIAZEPAM 5 MG PO TABS
10.0000 mg | ORAL_TABLET | Freq: Once | ORAL | Status: AC
  Administered 2020-03-19: 10 mg via ORAL
  Filled 2020-03-19: qty 2

## 2020-03-19 MED ORDER — HYDROCODONE-ACETAMINOPHEN 5-325 MG PO TABS
1.0000 | ORAL_TABLET | Freq: Once | ORAL | Status: AC
  Administered 2020-03-19: 1 via ORAL
  Filled 2020-03-19: qty 1

## 2020-03-19 MED ORDER — ACETAMINOPHEN 325 MG PO TABS
650.0000 mg | ORAL_TABLET | Freq: Once | ORAL | Status: AC
  Administered 2020-03-19: 650 mg via ORAL
  Filled 2020-03-19: qty 2

## 2020-03-19 MED ORDER — SODIUM CHLORIDE 0.9 % IV SOLN
15.0000 mg/kg/h | Freq: Once | INTRAVENOUS | Status: AC
  Administered 2020-03-19: 15 mg/kg/h via INTRAVENOUS
  Filled 2020-03-19: qty 2

## 2020-03-19 MED ORDER — IOHEXOL 300 MG/ML  SOLN
75.0000 mL | Freq: Once | INTRAMUSCULAR | Status: AC | PRN
  Administered 2020-03-19: 75 mL via INTRAVENOUS

## 2020-03-19 MED ORDER — DIPHENOXYLATE-ATROPINE 2.5-0.025 MG PO TABS: 2.0000 | ORAL_TABLET | Freq: Four times a day (QID) | ORAL | 0 refills | Status: DC | PRN

## 2020-03-19 MED ORDER — HEPARIN SOD (PORK) LOCK FLUSH 100 UNIT/ML IV SOLN
500.0000 [IU] | Freq: Every day | INTRAVENOUS | Status: AC | PRN
  Administered 2020-03-19: 500 [IU]
  Filled 2020-03-19: qty 5

## 2020-03-19 MED ORDER — DIPHENOXYLATE-ATROPINE 2.5-0.025 MG PO TABS
1.0000 | ORAL_TABLET | Freq: Once | ORAL | Status: AC
  Administered 2020-03-19: 1 via ORAL
  Filled 2020-03-19: qty 1

## 2020-03-19 MED ORDER — DIPHENHYDRAMINE HCL 50 MG/ML IJ SOLN
25.0000 mg | Freq: Once | INTRAMUSCULAR | Status: AC
  Administered 2020-03-19: 25 mg via INTRAVENOUS
  Filled 2020-03-19: qty 1

## 2020-03-19 MED ORDER — SODIUM CHLORIDE 0.9% FLUSH
10.0000 mL | Freq: Once | INTRAVENOUS | Status: AC
  Administered 2020-03-19: 10 mL via INTRAVENOUS
  Filled 2020-03-19: qty 10

## 2020-03-19 MED ORDER — POTASSIUM CHLORIDE CRYS ER 20 MEQ PO TBCR: 20.0000 meq | EXTENDED_RELEASE_TABLET | Freq: Two times a day (BID) | ORAL | 0 refills | Status: DC

## 2020-03-19 MED ORDER — SODIUM CHLORIDE 0.9% IV SOLUTION
250.0000 mL | Freq: Once | INTRAVENOUS | Status: AC
  Administered 2020-03-19: 250 mL via INTRAVENOUS
  Filled 2020-03-19: qty 250

## 2020-03-19 MED ORDER — HEPARIN SOD (PORK) LOCK FLUSH 100 UNIT/ML IV SOLN
INTRAVENOUS | Status: AC
  Filled 2020-03-19: qty 5

## 2020-03-19 MED ORDER — ALLOPURINOL 300 MG PO TABS: 300.0000 mg | ORAL_TABLET | Freq: Every day | ORAL | 0 refills | Status: AC

## 2020-03-19 NOTE — Progress Notes (Signed)
n

## 2020-03-19 NOTE — Progress Notes (Signed)
Norco given to pt at 1116 per order. Per MD to administer tylenol 650 mg PO and benadryl 25mg  IV prior to blood transfusion. Pt updated and agrees with plan. Premedications given to pt at 1346 prior to blood transfusion. Pt.'s temp prior to transfusion 99.2 tympanic. MD notified, per MD to continue with blood transfusion. Per pt request to receive Valium 10 mg PO during blood transfusion. Orders placed by MD, per MD okay to give Valium PO to patient during blood transfusion. Valium 10 mg PO given at 1512.   Pt tolerated blood transfusion well with no signs of complications. VSS. After blood transfusion, pt was escorted to medical mall by , RN to have scans completed. Per Lysle Dingwall she will updated pt.'s sister for transportation needs.  Beonka Amesquita Enrique Sack
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
## 2020-03-20 ENCOUNTER — Emergency Department: Payer: Medicare Other

## 2020-03-20 ENCOUNTER — Other Ambulatory Visit: Payer: Self-pay

## 2020-03-20 ENCOUNTER — Inpatient Hospital Stay
Admission: EM | Admit: 2020-03-20 | Discharge: 2020-03-26 | DRG: 872 | Disposition: A | Discharge status: Skilled Nursing Facility | Payer: Medicare Other | Attending: Internal Medicine | Admitting: Internal Medicine

## 2020-03-20 DIAGNOSIS — D696 Thrombocytopenia, unspecified: Secondary | ICD-10-CM | POA: Diagnosis not present

## 2020-03-20 DIAGNOSIS — E440 Moderate protein-calorie malnutrition: Secondary | ICD-10-CM | POA: Diagnosis present

## 2020-03-20 DIAGNOSIS — M79601 Pain in right arm: Secondary | ICD-10-CM | POA: Diagnosis not present

## 2020-03-20 DIAGNOSIS — A4151 Sepsis due to Escherichia coli [E. coli]: Principal | ICD-10-CM | POA: Diagnosis present

## 2020-03-20 DIAGNOSIS — Z681 Body mass index (BMI) 19 or less, adult: Secondary | ICD-10-CM | POA: Diagnosis not present

## 2020-03-20 DIAGNOSIS — E876 Hypokalemia: Secondary | ICD-10-CM | POA: Diagnosis not present

## 2020-03-20 DIAGNOSIS — R600 Localized edema: Secondary | ICD-10-CM

## 2020-03-20 DIAGNOSIS — Z79899 Other long term (current) drug therapy: Secondary | ICD-10-CM

## 2020-03-20 DIAGNOSIS — Z87891 Personal history of nicotine dependence: Secondary | ICD-10-CM

## 2020-03-20 DIAGNOSIS — E119 Type 2 diabetes mellitus without complications: Secondary | ICD-10-CM | POA: Diagnosis not present

## 2020-03-20 DIAGNOSIS — A419 Sepsis, unspecified organism: Secondary | ICD-10-CM

## 2020-03-20 DIAGNOSIS — S61401A Unspecified open wound of right hand, initial encounter: Secondary | ICD-10-CM

## 2020-03-20 DIAGNOSIS — M1990 Unspecified osteoarthritis, unspecified site: Secondary | ICD-10-CM | POA: Diagnosis present

## 2020-03-20 DIAGNOSIS — D72819 Decreased white blood cell count, unspecified: Secondary | ICD-10-CM | POA: Diagnosis not present

## 2020-03-20 DIAGNOSIS — R7881 Bacteremia: Secondary | ICD-10-CM | POA: Diagnosis present

## 2020-03-20 DIAGNOSIS — E1140 Type 2 diabetes mellitus with diabetic neuropathy, unspecified: Secondary | ICD-10-CM | POA: Diagnosis present

## 2020-03-20 DIAGNOSIS — M109 Gout, unspecified: Secondary | ICD-10-CM | POA: Diagnosis present

## 2020-03-20 DIAGNOSIS — Z8543 Personal history of malignant neoplasm of ovary: Secondary | ICD-10-CM

## 2020-03-20 DIAGNOSIS — Z515 Encounter for palliative care: Secondary | ICD-10-CM | POA: Diagnosis not present

## 2020-03-20 DIAGNOSIS — B9620 Unspecified Escherichia coli [E. coli] as the cause of diseases classified elsewhere: Secondary | ICD-10-CM | POA: Diagnosis not present

## 2020-03-20 DIAGNOSIS — R9089 Other abnormal findings on diagnostic imaging of central nervous system: Secondary | ICD-10-CM | POA: Diagnosis present

## 2020-03-20 DIAGNOSIS — Z881 Allergy status to other antibiotic agents status: Secondary | ICD-10-CM

## 2020-03-20 DIAGNOSIS — E538 Deficiency of other specified B group vitamins: Secondary | ICD-10-CM | POA: Diagnosis present

## 2020-03-20 DIAGNOSIS — I129 Hypertensive chronic kidney disease with stage 1 through stage 4 chronic kidney disease, or unspecified chronic kidney disease: Secondary | ICD-10-CM | POA: Diagnosis present

## 2020-03-20 DIAGNOSIS — E1169 Type 2 diabetes mellitus with other specified complication: Secondary | ICD-10-CM

## 2020-03-20 DIAGNOSIS — Z20822 Contact with and (suspected) exposure to covid-19: Secondary | ICD-10-CM | POA: Diagnosis present

## 2020-03-20 DIAGNOSIS — L03113 Cellulitis of right upper limb: Secondary | ICD-10-CM | POA: Diagnosis present

## 2020-03-20 DIAGNOSIS — E1122 Type 2 diabetes mellitus with diabetic chronic kidney disease: Secondary | ICD-10-CM | POA: Diagnosis present

## 2020-03-20 DIAGNOSIS — Z1612 Extended spectrum beta lactamase (ESBL) resistance: Secondary | ICD-10-CM | POA: Diagnosis present

## 2020-03-20 DIAGNOSIS — L089 Local infection of the skin and subcutaneous tissue, unspecified: Secondary | ICD-10-CM | POA: Diagnosis not present

## 2020-03-20 DIAGNOSIS — D638 Anemia in other chronic diseases classified elsewhere: Secondary | ICD-10-CM | POA: Diagnosis present

## 2020-03-20 DIAGNOSIS — D849 Immunodeficiency, unspecified: Secondary | ICD-10-CM | POA: Diagnosis present

## 2020-03-20 DIAGNOSIS — C9110 Chronic lymphocytic leukemia of B-cell type not having achieved remission: Secondary | ICD-10-CM | POA: Diagnosis present

## 2020-03-20 DIAGNOSIS — C8318 Mantle cell lymphoma, lymph nodes of multiple sites: Secondary | ICD-10-CM | POA: Diagnosis not present

## 2020-03-20 DIAGNOSIS — L0390 Cellulitis, unspecified: Secondary | ICD-10-CM | POA: Diagnosis present

## 2020-03-20 DIAGNOSIS — I6529 Occlusion and stenosis of unspecified carotid artery: Secondary | ICD-10-CM | POA: Diagnosis present

## 2020-03-20 DIAGNOSIS — E782 Mixed hyperlipidemia: Secondary | ICD-10-CM | POA: Diagnosis present

## 2020-03-20 DIAGNOSIS — Z96611 Presence of right artificial shoulder joint: Secondary | ICD-10-CM | POA: Diagnosis present

## 2020-03-20 DIAGNOSIS — Z7982 Long term (current) use of aspirin: Secondary | ICD-10-CM

## 2020-03-20 DIAGNOSIS — M7989 Other specified soft tissue disorders: Secondary | ICD-10-CM

## 2020-03-20 DIAGNOSIS — C8310 Mantle cell lymphoma, unspecified site: Secondary | ICD-10-CM | POA: Diagnosis present

## 2020-03-20 DIAGNOSIS — N1831 Chronic kidney disease, stage 3a: Secondary | ICD-10-CM | POA: Diagnosis present

## 2020-03-20 DIAGNOSIS — A499 Bacterial infection, unspecified: Secondary | ICD-10-CM | POA: Diagnosis not present

## 2020-03-20 LAB — CBC WITH DIFFERENTIAL/PLATELET
Abs Immature Granulocytes: 0.22 10*3/uL — ABNORMAL HIGH (ref 0.00–0.07)
Basophils Absolute: 0.3 10*3/uL — ABNORMAL HIGH (ref 0.0–0.1)
Basophils Relative: 0 %
Eosinophils Absolute: 0.4 10*3/uL (ref 0.0–0.5)
Eosinophils Relative: 0 %
HCT: 31.5 % — ABNORMAL LOW (ref 36.0–46.0)
Hemoglobin: 10.4 g/dL — ABNORMAL LOW (ref 12.0–15.0)
Immature Granulocytes: 0 %
Lymphocytes Relative: 95 %
Lymphs Abs: 83.7 10*3/uL — ABNORMAL HIGH (ref 0.7–4.0)
MCH: 29.9 pg (ref 26.0–34.0)
MCHC: 33 g/dL (ref 30.0–36.0)
MCV: 90.5 fL (ref 80.0–100.0)
Monocytes Absolute: 0.9 10*3/uL (ref 0.1–1.0)
Monocytes Relative: 1 %
Neutro Abs: 3.7 10*3/uL (ref 1.7–7.7)
Neutrophils Relative %: 4 %
Platelets: 34 10*3/uL — ABNORMAL LOW (ref 150–400)
RBC: 3.48 MIL/uL — ABNORMAL LOW (ref 3.87–5.11)
RDW: 20.5 % — ABNORMAL HIGH (ref 11.5–15.5)
Smear Review: NORMAL
WBC Morphology: ABNORMAL
WBC: 89.2 10*3/uL (ref 4.0–10.5)
nRBC: 0 % (ref 0.0–0.2)

## 2020-03-20 LAB — COMPREHENSIVE METABOLIC PANEL
ALT: 48 U/L — ABNORMAL HIGH (ref 0–44)
AST: 22 U/L (ref 15–41)
Albumin: 4.1 g/dL (ref 3.5–5.0)
Alkaline Phosphatase: 110 U/L (ref 38–126)
Anion gap: 17 — ABNORMAL HIGH (ref 5–15)
BUN: 16 mg/dL (ref 8–23)
CO2: 18 mmol/L — ABNORMAL LOW (ref 22–32)
Calcium: 8.6 mg/dL — ABNORMAL LOW (ref 8.9–10.3)
Chloride: 97 mmol/L — ABNORMAL LOW (ref 98–111)
Creatinine, Ser: 0.64 mg/dL (ref 0.44–1.00)
GFR, Estimated: >60 mL/min (ref >60)
Glucose, Bld: 118 mg/dL — ABNORMAL HIGH (ref 70–99)
Potassium: 3.3 mmol/L — ABNORMAL LOW (ref 3.5–5.1)
Sodium: 132 mmol/L — ABNORMAL LOW (ref 135–145)
Total Bilirubin: 2.1 mg/dL — ABNORMAL HIGH (ref 0.3–1.2)
Total Protein: 6.8 g/dL (ref 6.5–8.1)

## 2020-03-20 LAB — CBG MONITORING, ED: Glucose-Capillary: 157 mg/dL — ABNORMAL HIGH (ref 70–99)

## 2020-03-20 LAB — LACTIC ACID, PLASMA: Lactic Acid, Venous: 1.3 mmol/L (ref 0.5–1.9)

## 2020-03-20 LAB — CK: Total CK: 45 U/L (ref 38–234)

## 2020-03-20 MED ORDER — HYDROXYUREA 500 MG PO CAPS
1000.0000 mg | ORAL_CAPSULE | Freq: Every day | ORAL | Status: DC
  Administered 2020-03-21 – 2020-03-26 (×6): 1000 mg via ORAL
  Filled 2020-03-20 (×8): qty 2

## 2020-03-20 MED ORDER — SODIUM CHLORIDE 0.9 % IV BOLUS
1000.0000 mL | Freq: Once | INTRAVENOUS | Status: AC
  Administered 2020-03-20: 1000 mL via INTRAVENOUS

## 2020-03-20 MED ORDER — SODIUM CHLORIDE 0.9% FLUSH
3.0000 mL | Freq: Two times a day (BID) | INTRAVENOUS | Status: DC
  Administered 2020-03-21 – 2020-03-24 (×7): 3 mL via INTRAVENOUS

## 2020-03-20 MED ORDER — INSULIN ASPART 100 UNIT/ML ~~LOC~~ SOLN: 0.0000 [IU] | Freq: Every day | SUBCUTANEOUS | Status: DC

## 2020-03-20 MED ORDER — INSULIN ASPART 100 UNIT/ML ~~LOC~~ SOLN
0.0000 [IU] | Freq: Three times a day (TID) | SUBCUTANEOUS | Status: DC
  Administered 2020-03-21 – 2020-03-22 (×2): 2 [IU] via SUBCUTANEOUS
  Administered 2020-03-24 – 2020-03-25 (×2): 1 [IU] via SUBCUTANEOUS
  Filled 2020-03-20 (×4): qty 1

## 2020-03-20 MED ORDER — ONDANSETRON HCL 4 MG/2ML IJ SOLN
4.0000 mg | Freq: Once | INTRAMUSCULAR | Status: AC
  Administered 2020-03-20: 4 mg via INTRAVENOUS
  Filled 2020-03-20: qty 2

## 2020-03-20 MED ORDER — ACETAMINOPHEN 325 MG PO TABS: 650.0000 mg | ORAL_TABLET | Freq: Once | ORAL | Status: AC

## 2020-03-20 MED ORDER — HYDRALAZINE HCL 50 MG PO TABS
50.0000 mg | ORAL_TABLET | Freq: Two times a day (BID) | ORAL | Status: DC
  Administered 2020-03-20 – 2020-03-25 (×11): 50 mg via ORAL
  Filled 2020-03-20 (×11): qty 1

## 2020-03-20 MED ORDER — MIRTAZAPINE 15 MG PO TABS
7.5000 mg | ORAL_TABLET | Freq: Every day | ORAL | Status: DC
  Administered 2020-03-20 – 2020-03-25 (×6): 7.5 mg via ORAL
  Filled 2020-03-20 (×6): qty 1

## 2020-03-20 MED ORDER — ASPIRIN EC 81 MG PO TBEC: 81.0000 mg | DELAYED_RELEASE_TABLET | Freq: Every day | ORAL | Status: DC

## 2020-03-20 MED ORDER — ALLOPURINOL 100 MG PO TABS
300.0000 mg | ORAL_TABLET | Freq: Every day | ORAL | Status: DC
  Administered 2020-03-21 – 2020-03-26 (×6): 300 mg via ORAL
  Filled 2020-03-20 (×3): qty 3
  Filled 2020-03-20: qty 1
  Filled 2020-03-20 (×3): qty 3

## 2020-03-20 MED ORDER — ONDANSETRON HCL 4 MG PO TABS: 8.0000 mg | ORAL_TABLET | Freq: Two times a day (BID) | ORAL | Status: DC | PRN

## 2020-03-20 MED ORDER — MORPHINE SULFATE (PF) 2 MG/ML IV SOLN
2.0000 mg | INTRAVENOUS | Status: DC | PRN
  Administered 2020-03-20 – 2020-03-21 (×2): 2 mg via INTRAVENOUS
  Filled 2020-03-20 (×2): qty 1

## 2020-03-20 MED ORDER — ACETAMINOPHEN 650 MG RE SUPP: 650.0000 mg | Freq: Four times a day (QID) | RECTAL | Status: DC | PRN

## 2020-03-20 MED ORDER — VANCOMYCIN HCL IN DEXTROSE 1-5 GM/200ML-% IV SOLN
1000.0000 mg | Freq: Once | INTRAVENOUS | Status: AC
  Administered 2020-03-20: 1000 mg via INTRAVENOUS
  Filled 2020-03-20: qty 200

## 2020-03-20 MED ORDER — OXYCODONE HCL 5 MG PO TABS
5.0000 mg | ORAL_TABLET | ORAL | Status: DC | PRN
  Administered 2020-03-21 – 2020-03-26 (×19): 5 mg via ORAL
  Filled 2020-03-20 (×19): qty 1

## 2020-03-20 MED ORDER — LACTATED RINGERS IV SOLN: INTRAVENOUS | Status: AC

## 2020-03-20 MED ORDER — VANCOMYCIN HCL 500 MG/100ML IV SOLN
500.0000 mg | INTRAVENOUS | Status: DC
  Administered 2020-03-21 – 2020-03-22 (×2): 500 mg via INTRAVENOUS
  Filled 2020-03-20 (×4): qty 100

## 2020-03-20 MED ORDER — ACETAMINOPHEN 325 MG PO TABS
650.0000 mg | ORAL_TABLET | Freq: Four times a day (QID) | ORAL | Status: DC | PRN
  Administered 2020-03-25 (×2): 650 mg via ORAL
  Filled 2020-03-20 (×2): qty 2

## 2020-03-20 MED ORDER — MORPHINE SULFATE (PF) 2 MG/ML IV SOLN
2.0000 mg | Freq: Once | INTRAVENOUS | Status: AC
  Administered 2020-03-20: 2 mg via INTRAVENOUS
  Filled 2020-03-20: qty 1

## 2020-03-20 MED ORDER — ALPRAZOLAM 0.5 MG PO TABS
0.2500 mg | ORAL_TABLET | Freq: Every day | ORAL | Status: DC | PRN
  Administered 2020-03-24 – 2020-03-25 (×2): 0.25 mg via ORAL
  Filled 2020-03-20 (×2): qty 1

## 2020-03-20 MED ORDER — ACETAMINOPHEN 325 MG PO TABS
ORAL_TABLET | ORAL | Status: AC
  Administered 2020-03-20: 650 mg via ORAL
  Filled 2020-03-20: qty 2

## 2020-03-20 MED ORDER — SODIUM CHLORIDE 0.9 % IV SOLN
1.0000 g | Freq: Three times a day (TID) | INTRAVENOUS | Status: DC
  Administered 2020-03-20 – 2020-03-21 (×3): 1 g via INTRAVENOUS
  Filled 2020-03-20 (×5): qty 1

## 2020-03-20 MED ORDER — LACTATED RINGERS IV SOLN: INTRAVENOUS | Status: DC

## 2020-03-20 MED ORDER — DIPHENHYDRAMINE HCL (SLEEP) 25 MG PO TABS: 25.0000 mg | ORAL_TABLET | Freq: Every day | ORAL | Status: DC

## 2020-03-20 MED ORDER — DIPHENHYDRAMINE HCL 25 MG PO CAPS
25.0000 mg | ORAL_CAPSULE | Freq: Every evening | ORAL | Status: DC | PRN
  Administered 2020-03-24: 25 mg via ORAL
  Filled 2020-03-20: qty 1

## 2020-03-20 NOTE — ED Notes (Signed)
Patient assisted to and from bathroom with use of wheelchair.

## 2020-03-20 NOTE — Consult Note (Signed)
Date of Consultation:  03/20/2020  Requesting Physician:  Carrie Mew  Reason for Consultation:  Right arm swelling, cellulitis, and pain  History of Present Illness: Jacqueline Russo is a 86 y.o. female presenting with a few day history of right arm pain and swelling.  The exact duration is unclear.  Per ED physician, this had started yesterday, but per oncology notes from yesterday, it had started the day before, and tonight, the patient tells me it started 4 days ago.  She is being treated for leukemia and had a treatment cycle recently.  She developed diarrhea.  The patient reports that she bruises easily and noted a wound in the dorsal aspect of her right hand.  She thinks that some stool got into the wound.  Over the course of these days, she has developed swelling and redness of the right hand and has progressed proximally to just proximal to the right elbow.  This is associated with pain.  Denies any drainage from the wound.  Denies any sensory changes except for pain, and reports it hurts to fully extend her elbow, flex or extend her wrist, and open/close her grip.  She does report fever at home and took Tylenol.  In the ED, her work up has included xray of the hand/wrist, U/S of RUE, and labs.  Her WBC is markedly elevated to 89, but this is highly unreliable in the setting of leukemia.  Her U/S did not show any DVT, and XR did not show any fractures.  Past Medical History: Past Medical History:  Diagnosis Date  . Anemia    Myelodysplastic syndrome  . Arthritis   . Hypertension   . Leukemia, chronic lymphoid (HCC)    CLL  . Ovarian cancer ([place]) 1963     Past Surgical History: Past Surgical History:  Procedure Laterality Date  . ABDOMINAL HYSTERECTOMY    . APPENDECTOMY    . BREAST SURGERY     cyst removed from right breast  . EYE SURGERY Bilateral    Cataract Extractions  . IR FLUORO GUIDE CV LINE RIGHT  06/30/2017  . REVERSE SHOULDER ARTHROPLASTY Right 10/28/2014    Procedure: REVERSE SHOULDER ARTHROPLASTY;  Surgeon: Corky Mull, MD;  Location: [facility] ORS;  Service: Orthopedics;  Laterality: Right;    Home Medications: Prior to Admission medications   Medication Sig Start Date End Date Taking? Authorizing Provider  allopurinol (ZYLOPRIM) 300 MG tablet Take 1 tablet (300 mg total) by mouth daily. 03/19/20   Lloyd Huger, MD  ALPRAZolam Duanne Moron) 0.25 MG tablet Take 1 tablet (0.25 mg total) by mouth daily as needed for anxiety. 02/20/20   Lloyd Huger, MD  aspirin 81 MG tablet Take 81 mg by mouth daily.    [provider]  cyanocobalamin (,VITAMIN B-12,) 1000 MCG/ML injection Inject into the muscle. 03/09/16   [provider]  diphenhydrAMINE (SOMINEX) 25 MG tablet Take 25 mg by mouth at bedtime.    [provider]  diphenoxylate-atropine (LOMOTIL) 2.5-0.025 MG tablet Take 2 tablets by mouth every 6 (six) hours as needed for diarrhea or loose stools. 03/19/20   Verlon Au, NP  etodolac (LODINE) 400 MG tablet Take 400 mg by mouth daily. 06/04/19 06/03/20  [provider]  hydrALAZINE (APRESOLINE) 50 MG tablet Take 50 mg by mouth 2 (two) times daily. 12/20/19   [provider]  HYDROcodone-acetaminophen (NORCO/VICODIN) 5-325 MG tablet Take 1-2 tablets by mouth every 4 (four) hours as needed for moderate pain or severe pain.  03/05/20   Borders, Kirt Boys, NP  hydroxyurea (HYDREA) 500 MG capsule Take 2 capsules (1,000 mg total) by mouth daily. May take with food to minimize GI side effects. 03/12/20   Lloyd Huger, MD  lidocaine-prilocaine (EMLA) cream Apply 1 application topically as needed. Apply to port 1-2 hours prior to appointment. Cover with plastic wrap. 07/18/19   Lloyd Huger, MD  loperamide (IMODIUM A-D) 2 MG tablet Take 1 tablet (2 mg total) by mouth 4 (four) times daily as needed for diarrhea or loose stools. 03/19/20   Lloyd Huger, MD  metoCLOPramide (REGLAN) 5 MG tablet 1 tab  every morning, 1 tab bedtime Patient not taking: Reported on 03/19/2020 02/19/20   [provider]  mirtazapine (REMERON) 7.5 MG tablet Take 1 tablet (7.5 mg total) by mouth at bedtime. 03/05/20   Borders, Kirt Boys, NP  ondansetron (ZOFRAN) 8 MG tablet Take 1 tablet (8 mg total) by mouth every 8 (eight) hours as needed for nausea or vomiting. Patient not taking: Reported on 03/19/2020 05/23/19   Borders, Kirt Boys, NP  ondansetron (ZOFRAN) 8 MG tablet Take 1 tablet (8 mg total) by mouth 2 (two) times daily as needed for refractory nausea / vomiting. Start on day 2 after bendamustine chemo. Patient not taking: Reported on 03/19/2020 03/05/20   Lloyd Huger, MD  potassium chloride SA (KLOR-CON) 20 MEQ tablet Take 1 tablet (20 mEq total) by mouth 2 (two) times daily. 03/19/20   Lloyd Huger, MD  prochlorperazine (COMPAZINE) 10 MG tablet Take 1 tablet (10 mg total) by mouth every 6 (six) hours as needed (Nausea or vomiting). Patient not taking: Reported on 03/19/2020 03/05/20   Lloyd Huger, MD  tiZANidine (ZANAFLEX) 2 MG tablet Take 1 tablet (2 mg total) by mouth at bedtime as needed for muscle spasms. 12/29/16   Jacquelin Hawking, NP    Allergies: Allergies  Allergen Reactions  . Ciprofloxacin Nausea Only    Social History:  reports that she quit smoking about 59 years ago. Her smoking use included cigarettes. She smoked 2.00 packs per day. She has never used smokeless tobacco. She reports previous alcohol use of about 1.0 standard drink of alcohol per week. She reports that she does not use drugs.   Family History: History reviewed. No pertinent family history.  Review of Systems: Review of Systems  Constitutional: Positive for fever. Negative for chills.  HENT: Negative for hearing loss.   Respiratory: Negative for shortness of breath.   Cardiovascular: Negative for chest pain.  Gastrointestinal: Negative for abdominal pain, nausea and vomiting.   Genitourinary: Negative for dysuria.  Musculoskeletal: Negative for myalgias.  Skin: Negative for rash.       RUE swelling and pain  Neurological: Negative for sensory change and focal weakness.  Psychiatric/Behavioral: Negative for depression.    Physical Exam BP (!) 201/81 (BP Location: Left Arm)   Pulse (!) 104   Temp 99.9 F (37.7 C) (Oral)   Resp 20   Ht 5' (1.524 m)   Wt 44 kg   SpO2 94%   BMI 18.94 kg/m  CONSTITUTIONAL: No acute distress HEENT:  Normocephalic, atraumatic, extraocular motion intact. NECK: Trachea is midline, and there is no jugular venous distension. RESPIRATORY:  Normal respiratory effort without pathologic use of accessory muscles. CARDIOVASCULAR: Regular rhythm and rate. GI: The abdomen is soft, non-distended. MUSCULOSKELETAL:  RUE with normal strength and minimally limited ROM of elbow and wrist due to pain.  The right hand including  fingers, and forearm have diffuse edema, but no fluctuance or evidence of abscess.  The edema extends proximal to the elbow.  These areas are tender to palpation.  The dorsal aspect of the hand has a 2 cm wound, but without any purulence or fluctuance. SKIN: Skin turgor is normal. There are no pathologic skin lesions.  NEUROLOGIC:  Motor and sensation is grossly normal.  Cranial nerves are grossly intact. PSYCH:  Alert and oriented to person, place and time. Affect is normal.  Laboratory Analysis: Results for orders placed or performed during the hospital encounter of 03/20/20 (from the past 24 hour(s))  Lactic acid, plasma     Status: None   Collection Time: 03/20/20 12:39 PM  Result Value Ref Range   Lactic Acid, Venous 1.3 0.5 - 1.9 mmol/L  Comprehensive metabolic panel     Status: Abnormal   Collection Time: 03/20/20 12:39 PM  Result Value Ref Range   Sodium 132 (L) 135 - 145 mmol/L   Potassium 3.3 (L) 3.5 - 5.1 mmol/L   Chloride 97 (L) 98 - 111 mmol/L   CO2 18 (L) 22 - 32 mmol/L   Glucose, Bld 118 (H) 70 - 99  mg/dL   BUN 16 8 - 23 mg/dL   Creatinine, Ser 9.41 0.44 - 1.00 mg/dL   Calcium 8.6 (L) 8.9 - 10.3 mg/dL   Total Protein 6.8 6.5 - 8.1 g/dL   Albumin 4.1 3.5 - 5.0 g/dL   AST 22 15 - 41 U/L   ALT 48 (H) 0 - 44 U/L   Alkaline Phosphatase 110 38 - 126 U/L   Total Bilirubin 2.1 (H) 0.3 - 1.2 mg/dL   GFR, Estimated >74 >08 mL/min   Anion gap 17 (H) 5 - 15  CBC with Differential     Status: Abnormal   Collection Time: 03/20/20 12:39 PM  Result Value Ref Range   WBC 89.2 (HH) 4.0 - 10.5 K/uL   RBC 3.48 (L) 3.87 - 5.11 MIL/uL   Hemoglobin 10.4 (L) 12.0 - 15.0 g/dL   HCT 14.4 (L) 81.8 - 56.3 %   MCV 90.5 80.0 - 100.0 fL   MCH 29.9 26.0 - 34.0 pg   MCHC 33.0 30.0 - 36.0 g/dL   RDW 14.9 (H) 70.2 - 63.7 %   Platelets 34 (L) 150 - 400 K/uL   nRBC 0.0 0.0 - 0.2 %   Neutrophils Relative % 4 %   Neutro Abs 3.7 1.7 - 7.7 K/uL   Lymphocytes Relative 95 %   Lymphs Abs 83.7 (H) 0.7 - 4.0 K/uL   Monocytes Relative 1 %   Monocytes Absolute 0.9 0.1 - 1.0 K/uL   Eosinophils Relative 0 %   Eosinophils Absolute 0.4 0.0 - 0.5 K/uL   Basophils Relative 0 %   Basophils Absolute 0.3 (H) 0.0 - 0.1 K/uL   WBC Morphology Abnormal lymphocytes present    RBC Morphology MORPHOLOGY UNREMARKABLE    Smear Review Normal platelet morphology    Immature Granulocytes 0 %   Abs Immature Granulocytes 0.22 (H) 0.00 - 0.07 K/uL   Abnormal Lymphocytes Present PRESENT     Imaging: DG Chest 1 View  Result Date: 03/20/2020 CLINICAL DATA:  Right arm pain, swelling, and redness. Sepsis and fever. EXAM: CHEST  1 VIEW COMPARISON:  10/28/2016 FINDINGS: Power port type central venous catheter with tip over the cavoatrial junction region. No pneumothorax. Shallow inspiration. Cardiac enlargement. No vascular congestion or edema. Peribronchial thickening and streaky perihilar opacities similar to prior  study, likely chronic bronchitic change. Mild atelectasis in the lung bases. No pleural effusions. No pneumothorax.  Calcification of the aorta. Degenerative changes in the spine and left shoulder. Postoperative change in the right shoulder. IMPRESSION: Cardiac enlargement. No evidence of active pulmonary disease. Electronically Signed   By: Lucienne Capers M.D.   On: 03/20/2020 16:43   US Venous Img Upper Uni Right(DVT)  Result Date: 03/20/2020 CLINICAL DATA:  Right arm swelling. Abrasion to the right wrist with possible infection yesterday. Worsening today. Patient is on chemotherapy for CLL. Pain and edema with color changes. Anticoagulation therapy. EXAM: Right UPPER EXTREMITY VENOUS DOPPLER ULTRASOUND TECHNIQUE: Gray-scale sonography with graded compression, as well as color Doppler and duplex ultrasound were performed to evaluate the upper extremity deep venous system from the level of the subclavian vein and including the jugular, axillary, basilic, radial, ulnar and upper cephalic vein. Spectral Doppler was utilized to evaluate flow at rest and with distal augmentation maneuvers. COMPARISON:  None. FINDINGS: Contralateral Subclavian Vein: Respiratory phasicity is normal and symmetric with the symptomatic side. No evidence of thrombus. Normal compressibility. Internal Jugular Vein: No evidence of thrombus. Normal compressibility, respiratory phasicity and response to augmentation. Subclavian Vein: No evidence of thrombus. Normal compressibility, respiratory phasicity and response to augmentation. Axillary Vein: No evidence of thrombus. Normal compressibility, respiratory phasicity and response to augmentation. Cephalic Vein: No evidence of thrombus. Normal compressibility, respiratory phasicity and response to augmentation. Basilic Vein: No evidence of thrombus. Normal compressibility, respiratory phasicity and response to augmentation. Brachial Veins: No evidence of thrombus. Normal compressibility, respiratory phasicity and response to augmentation. Radial Veins: No evidence of thrombus. Normal compressibility,  respiratory phasicity and response to augmentation. Ulnar Veins: No evidence of thrombus. Normal compressibility, respiratory phasicity and response to augmentation. Venous Reflux:  None visualized. Other Findings: Contralateral left subclavian vein is also evaluated and is patent. IMPRESSION: No evidence of DVT within the right upper extremity. Electronically Signed   By: Lucienne Capers M.D.   On: 03/20/2020 16:41    Assessment and Plan: This is a 86 y.o. female with RUE cellulitis, edema, and pain.  --Unclear of the exact duration of her symptoms, but if this has progressed over 4 days as she tells me today, it would be unlikely that this is a necrotizing soft tissue infection.  There is no fluctuance and no crepitus on palpation.  No purulence from the dorsal hand wound.   --Agree with strong broad spectrum IV antibiotics at this point, particularly in the setting of being immunosuppressed.  Have marked the extent of the edema proximally to use to compare. --Discussed with the patient that if there is no improvement, would need to obtain a CT scan of her RUE to eval for possible abscess.  Face-to-face time spent with the patient and care providers was 55 minutes, with more than 50% of the time spent counseling, educating, and coordinating care of the patient.     Melvyn Neth, MD [facility] Pg:  [phone]

## 2020-03-20 NOTE — ED Notes (Addendum)
Patient placed on 2L [place]. Oxygen saturation improved from 85% to 97%. Pt denies oxygen use at home. Vancomycin re-started at this time at reduced rate of 100 mL/hr.

## 2020-03-20 NOTE — ED Triage Notes (Addendum)
Pt here via POV from home.  Pt w/ complaints of R arm and hand swelling. Swelling extends from above R elbow, down wrist and fingers. Pt reports swelling started overnight. States she had a small abrasion present to R wrist that broke into her hand. States she had chemotherapy on Monday for leukemia and has had diarrhea since, believes the wound may have been contaminated with feces.   Pt states she was seen yesterday for same issue but today she is much worse, with increasing pain in R extremity. Area is warm to the touch.   Reports fever at home of highest 102. Pt unable to take BP meds prior to arrival.

## 2020-03-20 NOTE — Progress Notes (Signed)
Pharmacy Antibiotic Note  Jacqueline Russo is a 86 y.o. female admitted on 03/20/2020. Pharmacy has been consulted for vancomycin dosing.  Plan: Vancomycin 1000 mg IV x 1 given in the ED. Continue with vancomycin 500 mg IV q24h to start 1/1 at 1800. Continue to follow renal function closely.  Height: 5' (152.4 cm) Weight: 44 kg (97 lb) IBW/kg (Calculated) : 45.5  Temp (24hrs), Avg:99.6 F (37.6 C), Min:99 F (37.2 C), Max:100 F (37.8 C)  Recent Labs  Lab 03/16/20 0828 03/17/20 0921 03/19/20 0824 03/20/20 1239  WBC 201.4* 44.7* 108.3* 89.2*  CREATININE 0.72 0.88 0.65 0.64  LATICACIDVEN  --   --   --  1.3    Estimated Creatinine Clearance: 33.8 mL/min (by C-G formula based on SCr of 0.64 mg/dL).    Allergies  Allergen Reactions  . Ciprofloxacin Nausea Only    Antimicrobials this admission: Vancomycin 12/31 >> Meropenem 12/31 >>   Microbiology results: 12/31 BCx: pending   Thank you for allowing pharmacy to be a part of this patient's care.  Pricilla Riffle, PharmD 03/20/2020 7:46 PM

## 2020-03-20 NOTE — H&P (Addendum)
History and Physical    Jacqueline Russo [id number] DOB: [birth year] DOA: 03/20/2020  Referring MD/NP/PA:   PCP: [name], MD   Patient coming from:  The patient is coming from home.  At baseline, pt is independent for most of ADL.        Chief Complaint: Right hand, arm pain swollen  HPI: Jacqueline Russo is a 86 y.o. female with medical history significant of CLL with chemo, peripheral neuropathy, hyperlipidemia, gout type 2 diabetes, CKD stage III, vitamin B 12 deficiency, MDS, carotid stenosis, malnutrition presented with right hand, arm pain, swollen  When I saw patient, she is alert and oriented,  although significant pain and chills.  Per patient, chart review, noticed she have some bleeding down the right back of her hand because a small skin wound, then progressive to get swollen, painful, she was seen in heme-onc clinic yesterday, x-ray was negative for fracture, was recommended to see orthopedic urgent care however,  her pain and swelling progressively worsened significantly up to the elbow, also reports fever 102, she went to the ED.  Reports have some loose stool on Monday but resolved spontaneously.  Patient reports no chest pain, palpitation, shortness of breath, cough, abdominal pain, vomiting, dysuria, leg swelling.   ED Course: pt was found to have T-max 100, heart rate 104-116, respirate 16-20, BP 178/76-209/86, oxygen saturation 94-99%.  Labs shows sodium 132, potassium 3.3, bicarb 18, glucose 118,, total bilirubin 2.1, lactic acid 1.3, WBC 89.2 hemoglobin 10.4, 95% CT head: 1. Hypodense focus in the anterior right thalamus, may represent ischemia, age indeterminate. MRI could be used to further evaluate. 2. No focus of abnormal contrast enhancement. 3. Prominent calcified plaques in the bilateral vertebral arteries and carotid siphons. CXR: Cardiac enlargement. No evidence of active pulmonary disease. R Hand x-ray: Severe osteoarthritis is noted. No acute abnormality  seen in the right hand. R wrist: Osteoarthritis is noted involving the first carpometacarpal joint and radiocarpal joint. No acute abnormality seen in the right wrist. Right upper venous ultrasound: No evidence of DVT within the right upper extremity.  In the ED patient was given meropenem, vancomycin   Review of Systems:   General: no fevers, chills, no body weight gain, has poor appetite, has fatigue HEENT: no blurry vision, hearing changes or sore throat Respiratory: no dyspnea, coughing, wheezing CV: no chest pain, no palpitations GI: no nausea, vomiting, abdominal pain, diarrhea, constipation GU: no dysuria, burning on urination, increased urinary frequency, hematuria  Ext: no leg edema Neuro: no unilateral weakness, numbness, or tingling, no vision change or hearing loss Skin: no rash, no skin tear. MSK: No muscle spasm, no deformity, no limitation of range of movement in spin Heme: No easy bruising.  Travel history: No recent long distant travel.  Allergy:  Allergies  Allergen Reactions  . Ciprofloxacin Nausea Only    Past Medical History:  Diagnosis Date  . Anemia    Myelodysplastic syndrome  . Arthritis   . Hypertension   . Leukemia, chronic lymphoid (HCC)    CLL  . Ovarian cancer ([place]) 1963    Past Surgical History:  Procedure Laterality Date  . ABDOMINAL HYSTERECTOMY    . APPENDECTOMY    . BREAST SURGERY     cyst removed from right breast  . EYE SURGERY Bilateral    Cataract Extractions  . IR FLUORO GUIDE CV LINE RIGHT  06/30/2017  . REVERSE SHOULDER ARTHROPLASTY Right 10/28/2014   Procedure: REVERSE SHOULDER ARTHROPLASTY;  Surgeon: Corky Mull, MD;  Location: [facility] ORS;  Service: Orthopedics;  Laterality: Right;    Social History:  reports that she quit smoking about 59 years ago. Her smoking use included cigarettes. She smoked 2.00 packs per day. She has never used smokeless tobacco. She reports previous alcohol use of about 1.0 standard drink of  alcohol per week. She reports that she does not use drugs.  Family History: History reviewed. No pertinent family history.   Prior to Admission medications   Medication Sig Start Date End Date Taking? Authorizing Provider  allopurinol (ZYLOPRIM) 300 MG tablet Take 1 tablet (300 mg total) by mouth daily. 03/19/20   Jeralyn Ruths, MD  ALPRAZolam Prudy Feeler) 0.25 MG tablet Take 1 tablet (0.25 mg total) by mouth daily as needed for anxiety. 02/20/20   Jeralyn Ruths, MD  aspirin 81 MG tablet Take 81 mg by mouth daily.    [provider]  cyanocobalamin (,VITAMIN B-12,) 1000 MCG/ML injection Inject into the muscle. 03/09/16   [provider]  diphenhydrAMINE (SOMINEX) 25 MG tablet Take 25 mg by mouth at bedtime.    [provider]  diphenoxylate-atropine (LOMOTIL) 2.5-0.025 MG tablet Take 2 tablets by mouth every 6 (six) hours as needed for diarrhea or loose stools. 03/19/20   Alinda Dooms, NP  etodolac (LODINE) 400 MG tablet Take 400 mg by mouth daily. 06/04/19 06/03/20  [provider]  hydrALAZINE (APRESOLINE) 50 MG tablet Take 50 mg by mouth 2 (two) times daily. 12/20/19   [provider]  HYDROcodone-acetaminophen (NORCO/VICODIN) 5-325 MG tablet Take 1-2 tablets by mouth every 4 (four) hours as needed for moderate pain or severe pain. 03/05/20   Borders, Daryl Eastern, NP  hydroxyurea (HYDREA) 500 MG capsule Take 2 capsules (1,000 mg total) by mouth daily. May take with food to minimize GI side effects. 03/12/20   Jeralyn Ruths, MD  lidocaine-prilocaine (EMLA) cream Apply 1 application topically as needed. Apply to port 1-2 hours prior to appointment. Cover with plastic wrap. 07/18/19   Jeralyn Ruths, MD  loperamide (IMODIUM A-D) 2 MG tablet Take 1 tablet (2 mg total) by mouth 4 (four) times daily as needed for diarrhea or loose stools. 03/19/20   Jeralyn Ruths, MD  metoCLOPramide (REGLAN) 5 MG tablet 1 tab every morning, 1 tab  bedtime Patient not taking: Reported on 03/19/2020 02/19/20   [provider]  mirtazapine (REMERON) 7.5 MG tablet Take 1 tablet (7.5 mg total) by mouth at bedtime. 03/05/20   Borders, Daryl Eastern, NP  ondansetron (ZOFRAN) 8 MG tablet Take 1 tablet (8 mg total) by mouth every 8 (eight) hours as needed for nausea or vomiting. Patient not taking: Reported on 03/19/2020 05/23/19   Borders, Daryl Eastern, NP  ondansetron (ZOFRAN) 8 MG tablet Take 1 tablet (8 mg total) by mouth 2 (two) times daily as needed for refractory nausea / vomiting. Start on day 2 after bendamustine chemo. Patient not taking: Reported on 03/19/2020 03/05/20   Jeralyn Ruths, MD  potassium chloride SA (KLOR-CON) 20 MEQ tablet Take 1 tablet (20 mEq total) by mouth 2 (two) times daily. 03/19/20   Jeralyn Ruths, MD  prochlorperazine (COMPAZINE) 10 MG tablet Take 1 tablet (10 mg total) by mouth every 6 (six) hours as needed (Nausea or vomiting). Patient not taking: Reported on 03/19/2020 03/05/20   Jeralyn Ruths, MD  tiZANidine (ZANAFLEX) 2 MG tablet Take 1 tablet (2 mg total) by mouth at bedtime as needed for muscle spasms. 12/29/16  Jacquelin Hawking, NP    Physical Exam: Vitals:   03/20/20 1218 03/20/20 1219 03/20/20 1411 03/20/20 1730  BP: (!) 209/86  (!) 178/76 (!) 201/81  Pulse: (!) 116  (!) 105 (!) 104  Resp: 16  16 20   Temp: 100 F (37.8 C)  99 F (37.2 C) 99.9 F (37.7 C)  TempSrc: Oral  Oral Oral  SpO2: 99%  95% 94%  Weight:  44 kg    Height:  5' (1.524 m)     General: Not in acute distress HEENT:       Eyes: PERRL, EOMI, no scleral icterus.       ENT: No discharge from the ears and nose, no pharynx injection, no tonsillar enlargement.        Neck: No JVD, no bruit, no mass felt. Heme: No neck lymph node enlargement. Cardiac: S1/S2, RRR, No murmurs, No gallops or rubs. Respiratory: Good air movement bilaterally. No rales, wheezing, rhonchi or rubs. GI: Soft, nondistended, nontender, no  rebound pain, no organomegaly, BS present. GU: No hematuria Ext: No pitting leg edema bilaterally. 2+DP/PT pulse bilaterally. Musculoskeletal: Small wound at back of the right hand significant swollen tender from wrist to elbow,  slightly limited range of motion of  skin: No rashes.  Neuro: Alert, oriented X3.  No focal neuro deficit Psych: Patient is not psychotic, no suicidal or hemocidal ideation.  Labs on Admission: I have personally reviewed following labs and imaging studies  CBC: Recent Labs  Lab 03/16/20 0828 03/17/20 0921 03/17/20 1233 03/19/20 0824 03/20/20 1239  WBC 201.4* 44.7*  --  108.3* 89.2*  NEUTROABS 4.8 8.1*  --  6.0 3.7  HGB 6.0* 5.4*  --  7.6* 10.4*  HCT 21.1* 17.0*  --  23.7* 31.5*  MCV 106.0* 100.0  --  96.3 90.5  PLT 45* 10* 47* 46* 34*   Basic Metabolic Panel: Recent Labs  Lab 03/16/20 0828 03/17/20 0921 03/19/20 0824 03/20/20 1239  NA 133* 131* 137 132*  K 3.7 4.1 3.0* 3.3*  CL 102 102 104 97*  CO2 19* 12* 20* 18*  GLUCOSE 145* 229* 137* 118*  BUN 25* 26* 22 16  CREATININE 0.72 0.88 0.65 0.64  CALCIUM 9.0 8.4* 8.4* 8.6*  MG  --  1.9 1.8  --   PHOS  --  3.7  --   --    GFR: Estimated Creatinine Clearance: 33.8 mL/min (by C-G formula based on SCr of 0.64 mg/dL). Liver Function Tests: Recent Labs  Lab 03/16/20 0828 03/17/20 0921 03/19/20 0824 03/20/20 1239  AST 32 46* 33 22  ALT 46* 52* 65* 48*  ALKPHOS 108 115 117 110  BILITOT 1.0 0.8 1.4* 2.1*  PROT 6.7 6.4* 6.7 6.8  ALBUMIN 4.1 3.9 4.2 4.1   No results for input(s): LIPASE, AMYLASE in the last 168 hours. No results for input(s): AMMONIA in the last 168 hours. Coagulation Profile: No results for input(s): INR, PROTIME in the last 168 hours. Cardiac Enzymes: No results for input(s): CKTOTAL, CKMB, CKMBINDEX, TROPONINI in the last 168 hours. BNP (last 3 results) No results for input(s): PROBNP in the last 8760 hours. HbA1C: No results for input(s): HGBA1C in the last 72  hours. CBG: No results for input(s): GLUCAP in the last 168 hours. Lipid Profile: No results for input(s): CHOL, HDL, LDLCALC, TRIG, CHOLHDL, LDLDIRECT in the last 72 hours. Thyroid Function Tests: No results for input(s): TSH, T4TOTAL, FREET4, T3FREE, THYROIDAB in the last 72 hours. Anemia Panel: No results  for input(s): VITAMINB12, FOLATE, FERRITIN, TIBC, IRON, RETICCTPCT in the last 72 hours. Urine analysis:    Component Value Date/Time   COLORURINE YELLOW (A) 08/22/2019 0900   APPEARANCEUR HAZY (A) 08/22/2019 0900   APPEARANCEUR Cloudy (A) 02/05/2018 1116   LABSPEC 1.008 08/22/2019 0900   LABSPEC 1.010 10/21/2012 0106   PHURINE 5.0 08/22/2019 0900   GLUCOSEU NEGATIVE 08/22/2019 0900   GLUCOSEU Negative 10/21/2012 0106   HGBUR NEGATIVE 08/22/2019 0900   BILIRUBINUR NEGATIVE 08/22/2019 0900   BILIRUBINUR Negative 02/05/2018 1116   BILIRUBINUR Negative 10/21/2012 0106   KETONESUR NEGATIVE 08/22/2019 0900   PROTEINUR NEGATIVE 08/22/2019 0900   NITRITE NEGATIVE 08/22/2019 0900   LEUKOCYTESUR TRACE (A) 08/22/2019 0900   LEUKOCYTESUR 1+ 10/21/2012 0106   Sepsis Labs: @LABRCNTIP (procalcitonin:4,lacticidven:4) )No results found for this or any previous visit (from the past 240 hour(s)).   Radiological Exams on Admission: DG Chest 1 View  Result Date: 03/20/2020 CLINICAL DATA:  Right arm pain, swelling, and redness. Sepsis and fever. EXAM: CHEST  1 VIEW COMPARISON:  10/28/2016 FINDINGS: Power port type central venous catheter with tip over the cavoatrial junction region. No pneumothorax. Shallow inspiration. Cardiac enlargement. No vascular congestion or edema. Peribronchial thickening and streaky perihilar opacities similar to prior study, likely chronic bronchitic change. Mild atelectasis in the lung bases. No pleural effusions. No pneumothorax. Calcification of the aorta. Degenerative changes in the spine and left shoulder. Postoperative change in the right shoulder. IMPRESSION:  Cardiac enlargement. No evidence of active pulmonary disease. Electronically Signed   By: Lucienne Capers M.D.   On: 03/20/2020 16:43   DG Wrist Complete Right  Result Date: 03/20/2020 CLINICAL DATA:  Acute right wrist pain and swelling. EXAM: RIGHT WRIST - COMPLETE 3+ VIEW COMPARISON:  None. FINDINGS: There is no evidence of fracture or dislocation. Severe narrowing of the first carpometacarpal joint is noted as well as mild narrowing of the radiocarpal joint. Calcification of the triangular fibrocartilage is noted. Soft tissues are unremarkable. IMPRESSION: Osteoarthritis is noted involving the first carpometacarpal joint and radiocarpal joint. No acute abnormality seen in the right wrist. Electronically Signed   By: Marijo Conception M.D.   On: 03/20/2020 10:15   CT Head W Wo Contrast  Addendum Date: 03/19/2020   ADDENDUM REPORT: 03/19/2020 18:16 ADDENDUM: These results were called by telephone at the time of interpretation on 03/19/2020 at 6:14 pm to Beckey Rutter NP, who verbally acknowledged these results. Electronically Signed   By: Pedro Earls M.D.   On: 03/19/2020 18:16   Result Date: 03/19/2020 CLINICAL DATA:  Change in mental status. 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 Fall. EXAM: CT HEAD WITHOUT AND WITH CONTRAST TECHNIQUE: Contiguous axial images were obtained from the base of the skull through the vertex without and with intravenous contrast CONTRAST:  44mL OMNIPAQUE IOHEXOL 300 MG/ML  SOLN COMPARISON:  Head CT October 20, 2012. FINDINGS: Brain: A 9 mm hypodense focus in the anterior right thalamus, may represent ischemia, age indeterminate. No focus of abnormal contrast enhancement. No hemorrhage, hydrocephalus, mass effect or extra-axial collection. Vascular: No hyperdense vessel prominent calcified plaques in the bilateral vertebral arteries and carotid siphons. Skull: Normal. Negative for fracture or focal lesion. Sinuses/Orbits: No acute finding. IMPRESSION: 1.  Hypodense focus in the anterior right thalamus, may represent ischemia, age indeterminate. MRI could be used to further evaluate. 2. No focus of abnormal contrast enhancement. 3. Prominent calcified plaques in the bilateral vertebral arteries and carotid siphons. Electronically Signed: By: Jeanella Craze  Debbrah Alar M.D. On: 03/19/2020 17:30   US Venous Img Upper Uni Right(DVT)  Result Date: 03/20/2020 CLINICAL DATA:  Right arm swelling. Abrasion to the right wrist with possible infection yesterday. Worsening today. Patient is on chemotherapy for CLL. Pain and edema with color changes. Anticoagulation therapy. EXAM: Right UPPER EXTREMITY VENOUS DOPPLER ULTRASOUND TECHNIQUE: Gray-scale sonography with graded compression, as well as color Doppler and duplex ultrasound were performed to evaluate the upper extremity deep venous system from the level of the subclavian vein and including the jugular, axillary, basilic, radial, ulnar and upper cephalic vein. Spectral Doppler was utilized to evaluate flow at rest and with distal augmentation maneuvers. COMPARISON:  None. FINDINGS: Contralateral Subclavian Vein: Respiratory phasicity is normal and symmetric with the symptomatic side. No evidence of thrombus. Normal compressibility. Internal Jugular Vein: No evidence of thrombus. Normal compressibility, respiratory phasicity and response to augmentation. Subclavian Vein: No evidence of thrombus. Normal compressibility, respiratory phasicity and response to augmentation. Axillary Vein: No evidence of thrombus. Normal compressibility, respiratory phasicity and response to augmentation. Cephalic Vein: No evidence of thrombus. Normal compressibility, respiratory phasicity and response to augmentation. Basilic Vein: No evidence of thrombus. Normal compressibility, respiratory phasicity and response to augmentation. Brachial Veins: No evidence of thrombus. Normal compressibility, respiratory phasicity and response to  augmentation. Radial Veins: No evidence of thrombus. Normal compressibility, respiratory phasicity and response to augmentation. Ulnar Veins: No evidence of thrombus. Normal compressibility, respiratory phasicity and response to augmentation. Venous Reflux:  None visualized. Other Findings: Contralateral left subclavian vein is also evaluated and is patent. IMPRESSION: No evidence of DVT within the right upper extremity. Electronically Signed   By: Lucienne Capers M.D.   On: 03/20/2020 16:41   DG Hand Complete Right  Result Date: 03/20/2020 CLINICAL DATA:  Acute right hand pain and swelling. EXAM: RIGHT HAND - COMPLETE 3+ VIEW COMPARISON:  None. FINDINGS: There is no evidence of fracture or dislocation. Severe degenerative changes are seen involving the first carpometacarpal joint as well as the proximal and distal interphalangeal joints consistent with osteoarthritis. Soft tissues are unremarkable. IMPRESSION: Severe osteoarthritis is noted. No acute abnormality seen in the right hand. Electronically Signed   By: Marijo Conception M.D.   On: 03/20/2020 10:13     EKG: Independently reviewed.  Not done in ED, will get one.   Assessment/Plan Principal Problem:   Cellulitis Active Problems:   CLL (chronic lymphocytic leukemia) (HCC)   DM type 2 with diabetic mixed hyperlipidemia (HCC)   Sepsis (HCC)   Abnormal CT of brain  Jacqueline Russo is a 86 y.o. female with medical history significant of CLL with chemo, peripheral neuropathy, hyperlipidemia, gout type 2 diabetes, CKD stage III, vitamin B 12 deficiency, MDS, carotid stenosis, malnutrition presented with right hand, arm pain, swollen  #Right hand, forearm swollen: Etiology likely cellulitis.  In the setting of immunocompromise given history of of CLL ED started on meropenem and vancomycin, will continue However given significant progressive in very short of time, concern for necrotizing soft tissue infection Call the ED doctor Dr Joni Fears @  [phone], he will get stat general surgery consult He also called heme-onc on-call MD, no specific management at now from their standpoint We will get ID consult as well  #possible Sepsis:  Evidence of fever, tachycardia secondary to cellulitis Lactate 1.3 IVF giving in ED Empirically Antibiotics meropenem and vancomycin given, see above Blood Culture Pending  #DM: On sliding scale insulin  #CLL: Heme-onc consult if warranted  #Abnormal CT of brain: Question  age undefined ischemic change, However patient has no history of CVA, currently no focal neuro deficit Also question if any brain malignancy Will get MRI of brain when patient more stable  #history significant of CLL with chemo, peripheral neuropathy, hyperlipidemia, gout type 2 diabetes, CKD stage III, vitamin B 12 deficiency, MDS, carotid stenosis, malnutrition presented with right hand, arm pain, swollen Established Active problems see above Continue home medications if appropriate follow-up with his PCP, specialist when necessary   DVT ppx: SCD Code Status: Full code, d/w patient  Family Communication: None at bed side.               Disposition Plan:  Anticipate discharge back to previous home environment Consults called:   Admission status:  Inpatient/tele   Date of Service 03/20/2020    Rayne Du [facility]   If 7PM-7AM, please contact night-coverage [email or web address] Password [facility] 03/20/2020, 6:44 PM

## 2020-03-20 NOTE — Progress Notes (Signed)
PHARMACY -  BRIEF ANTIBIOTIC NOTE   Pharmacy has received consult(s) for Vancomycin from an ED provider.  The patient's profile has been reviewed for ht/wt/allergies/indication/available labs.    One time order(s) placed for Vancomycin 1g   Further antibiotics/pharmacy consults should be ordered by admitting physician if indicated.                       Thank you, Gardner Candle, PharmD, BCPS Clinical Pharmacist 03/20/2020 3:47 PM

## 2020-03-20 NOTE — ED Provider Notes (Signed)
[facility] Emergency Department Provider Note  ____________________________________________  Time seen: Approximately 3:51 PM  I have reviewed the triage vital signs and the nursing notes.   HISTORY  Chief Complaint Arm Swelling    HPI Zuzanna Rager is a 86 y.o. female  With a history of CLL and hypertension currently on chemotherapy who comes the ED complaining of right upper extremity swelling and redness from the hand to the elbow.  She noticed yesterday that she had some bruising on the dorsal hand which caused a small skin wound.  She was seen in the clinic yesterday, had an x-ray which was negative for fracture.  She had the wound dressed at home, but has been battling diarrhea for the last 5 days since her last chemotherapy session and is worried that her wound was contaminated with stool.  Overnight the swelling became much more extensive, expanding from just being localized around the dorsal hand to extending all the way up to the elbow.  She also had a fever of 102.  She took Tylenol this morning for the fever.     Past Medical History:  Diagnosis Date  . Anemia    Myelodysplastic syndrome  . Arthritis   . Hypertension   . Leukemia, chronic lymphoid (HCC)    CLL  . Ovarian cancer [facility]) 1963     Patient Active Problem List   Diagnosis Date Noted  . Mantle cell lymphoma (HCC) 03/05/2020  . Goals of care, counseling/discussion 03/05/2020  . Anxiety 05/17/2019  . Hypokalemia due to inadequate potassium intake 05/17/2019  . Symptomatic anemia 05/17/2019  . Iron overload 05/17/2019  . MDS (myelodysplastic syndrome) with 5q deletion (HCC) 05/16/2019  . Port-A-Cath in place 02/18/2019  . Exertional dyspnea 10/04/2018  . Gouty arthropathy, chronic, without tophi 07/04/2018  . Right renal mass 02/28/2018  . DM type 2 with diabetic mixed hyperlipidemia (HCC) 08/25/2017  . Idiopathic peripheral neuropathy 02/22/2017  . CLL (chronic lymphocytic  leukemia) (HCC) 11/16/2015  . Myelodysplastic syndrome (HCC) 11/16/2015  . Vitamin B12 deficient megaloblastic anemia 10/22/2015  . Carotid stenosis, bilateral 07/21/2015  . Diet-controlled type 2 diabetes mellitus (HCC) 04/22/2015  . CKD (chronic kidney disease) stage 3, GFR 30-59 ml/min (HCC) 04/22/2015  . Benign essential hypertension 01/14/2014  . Hyperlipemia 01/14/2014  . Primary osteoarthritis of both hands 01/14/2014     Past Surgical History:  Procedure Laterality Date  . ABDOMINAL HYSTERECTOMY    . APPENDECTOMY    . BREAST SURGERY     cyst removed from right breast  . EYE SURGERY Bilateral    Cataract Extractions  . IR FLUORO GUIDE CV LINE RIGHT  06/30/2017  . REVERSE SHOULDER ARTHROPLASTY Right 10/28/2014   Procedure: REVERSE SHOULDER ARTHROPLASTY;  Surgeon: Christena Flake, MD;  Location: [facility] ORS;  Service: Orthopedics;  Laterality: Right;     Prior to Admission medications   Medication Sig Start Date End Date Taking? Authorizing Provider  allopurinol (ZYLOPRIM) 300 MG tablet Take 1 tablet (300 mg total) by mouth daily. 03/19/20   Jeralyn Ruths, MD  ALPRAZolam Prudy Feeler) 0.25 MG tablet Take 1 tablet (0.25 mg total) by mouth daily as needed for anxiety. 02/20/20   Jeralyn Ruths, MD  aspirin 81 MG tablet Take 81 mg by mouth daily.    [provider]  cyanocobalamin (,VITAMIN B-12,) 1000 MCG/ML injection Inject into the muscle. 03/09/16   [provider]  diphenhydrAMINE (SOMINEX) 25 MG tablet Take 25 mg by mouth at bedtime.  [provider]  diphenoxylate-atropine (LOMOTIL) 2.5-0.025 MG tablet Take 2 tablets by mouth every 6 (six) hours as needed for diarrhea or loose stools. 03/19/20   Alinda Dooms, NP  etodolac (LODINE) 400 MG tablet Take 400 mg by mouth daily. 06/04/19 06/03/20  [provider]  hydrALAZINE (APRESOLINE) 50 MG tablet Take 50 mg by mouth 2 (two) times daily. 12/20/19   [provider]   HYDROcodone-acetaminophen (NORCO/VICODIN) 5-325 MG tablet Take 1-2 tablets by mouth every 4 (four) hours as needed for moderate pain or severe pain. 03/05/20   Borders, Daryl Eastern, NP  hydroxyurea (HYDREA) 500 MG capsule Take 2 capsules (1,000 mg total) by mouth daily. May take with food to minimize GI side effects. 03/12/20   Jeralyn Ruths, MD  lidocaine-prilocaine (EMLA) cream Apply 1 application topically as needed. Apply to port 1-2 hours prior to appointment. Cover with plastic wrap. 07/18/19   Jeralyn Ruths, MD  loperamide (IMODIUM A-D) 2 MG tablet Take 1 tablet (2 mg total) by mouth 4 (four) times daily as needed for diarrhea or loose stools. 03/19/20   Jeralyn Ruths, MD  metoCLOPramide (REGLAN) 5 MG tablet 1 tab every morning, 1 tab bedtime Patient not taking: Reported on 03/19/2020 02/19/20   [provider]  mirtazapine (REMERON) 7.5 MG tablet Take 1 tablet (7.5 mg total) by mouth at bedtime. 03/05/20   Borders, Daryl Eastern, NP  ondansetron (ZOFRAN) 8 MG tablet Take 1 tablet (8 mg total) by mouth every 8 (eight) hours as needed for nausea or vomiting. Patient not taking: Reported on 03/19/2020 05/23/19   Borders, Daryl Eastern, NP  ondansetron (ZOFRAN) 8 MG tablet Take 1 tablet (8 mg total) by mouth 2 (two) times daily as needed for refractory nausea / vomiting. Start on day 2 after bendamustine chemo. Patient not taking: Reported on 03/19/2020 03/05/20   Jeralyn Ruths, MD  potassium chloride SA (KLOR-CON) 20 MEQ tablet Take 1 tablet (20 mEq total) by mouth 2 (two) times daily. 03/19/20   Jeralyn Ruths, MD  prochlorperazine (COMPAZINE) 10 MG tablet Take 1 tablet (10 mg total) by mouth every 6 (six) hours as needed (Nausea or vomiting). Patient not taking: Reported on 03/19/2020 03/05/20   Jeralyn Ruths, MD  tiZANidine (ZANAFLEX) 2 MG tablet Take 1 tablet (2 mg total) by mouth at bedtime as needed for muscle spasms. 12/29/16   Mauro Kaufmann, NP      Allergies Ciprofloxacin   No family history on file.  Social History Social History   Tobacco Use  . Smoking status: Former Smoker    Packs/day: 2.00    Types: Cigarettes    Quit date: 1963    Years since quitting: 59.0  . Smokeless tobacco: Never Used  Vaping Use  . Vaping Use: Never used  Substance Use Topics  . Alcohol use: Not Currently    Alcohol/week: 1.0 standard drink    Types: 1 Glasses of wine per week    Comment: 1 glass of wine daily  . Drug use: No    Review of Systems  Constitutional: Positive fever ENT:   No sore throat. No rhinorrhea. Cardiovascular:   No chest pain or syncope. Respiratory:   No dyspnea or cough. Gastrointestinal:   Negative for abdominal pain, positive diarrhea Musculoskeletal: Positive right hand and forearm swelling All other systems reviewed and are negative except as documented above in ROS and HPI.  ____________________________________________   PHYSICAL EXAM:  VITAL SIGNS: ED Triage Vitals  Enc Vitals Group     BP 03/20/20 1218 (!) 209/86     Pulse Rate 03/20/20 1218 (!) 116     Resp 03/20/20 1218 16     Temp 03/20/20 1218 100 F (37.8 C)     Temp Source 03/20/20 1218 Oral     SpO2 03/20/20 1218 99 %     Weight 03/20/20 1219 97 lb (44 kg)     Height 03/20/20 1219 5' (1.524 m)     Head Circumference --      Peak Flow --      Pain Score 03/20/20 1226 6     Pain Loc --      Pain Edu? --      Excl. in Advance? --     Vital signs reviewed, nursing assessments reviewed.   Constitutional:   Alert and oriented. Non-toxic appearance. Eyes:   Conjunctivae are normal. EOMI. PERRL. ENT      Head:   Normocephalic and atraumatic.      Nose:   Wearing a mask.      Mouth/Throat:   Wearing a mask.      Neck:   No meningismus. Full ROM. Hematological/Lymphatic/Immunilogical:   No cervical lymphadenopathy. Cardiovascular:   Tachycardia heart rate 110. Symmetric bilateral radial and DP pulses.  No murmurs. Cap refill less  than 2 seconds. Respiratory:   Normal respiratory effort without tachypnea/retractions. Breath sounds are clear and equal bilaterally. No wheezes/rales/rhonchi. Gastrointestinal:   Soft and nontender. Non distended. There is no CVA tenderness.  No rebound, rigidity, or guarding. Musculoskeletal:   Normal range of motion in all extremities. No joint effusions.  Right dorsal hand has swelling and ecchymosis with erythema and swelling extending over the radial wrist and volar forearm extending up to the elbow.  There is induration, erythema, warmth, tenderness.  No crepitus.  No fluctuant fluid collection.  No purulent drainage from the hand wound. Neurologic:   Normal speech and language.  Motor grossly intact. No acute focal neurologic deficits are appreciated.  Skin:    Skin is warm, dry with hand wound as noted above, otherwise intact.  There is scattered bruising, chronic issue according to patient..  No petechiae, purpura, or bullae.  ____________________________________________    LABS (pertinent positives/negatives) (all labs ordered are listed, but only abnormal results are displayed) Labs Reviewed  COMPREHENSIVE METABOLIC PANEL - Abnormal; Notable for the following components:      Result Value   Sodium 132 (*)    Potassium 3.3 (*)    Chloride 97 (*)    CO2 18 (*)    Glucose, Bld 118 (*)    Calcium 8.6 (*)    ALT 48 (*)    Total Bilirubin 2.1 (*)    Anion gap 17 (*)    All other components within normal limits  CBC WITH DIFFERENTIAL/PLATELET - Abnormal; Notable for the following components:   WBC 89.2 (*)    RBC 3.48 (*)    Hemoglobin 10.4 (*)    HCT 31.5 (*)    RDW 20.5 (*)    Platelets 34 (*)    Lymphs Abs 83.7 (*)    Basophils Absolute 0.3 (*)    Abs Immature Granulocytes 0.22 (*)    All other components within normal limits  CULTURE, BLOOD (ROUTINE X 2)  CULTURE, BLOOD (ROUTINE X 2)  SARS CORONAVIRUS 2 (TAT 6-24 HRS)  LACTIC ACID, PLASMA  URINALYSIS, COMPLETE  (UACMP) WITH MICROSCOPIC   ____________________________________________   EKG  ____________________________________________    RADIOLOGY  DG Chest 1 View  Result Date: 03/20/2020 CLINICAL DATA:  Right arm pain, swelling, and redness. Sepsis and fever. EXAM: CHEST  1 VIEW COMPARISON:  10/28/2016 FINDINGS: Power port type central venous catheter with tip over the cavoatrial junction region. No pneumothorax. Shallow inspiration. Cardiac enlargement. No vascular congestion or edema. Peribronchial thickening and streaky perihilar opacities similar to prior study, likely chronic bronchitic change. Mild atelectasis in the lung bases. No pleural effusions. No pneumothorax. Calcification of the aorta. Degenerative changes in the spine and left shoulder. Postoperative change in the right shoulder. IMPRESSION: Cardiac enlargement. No evidence of active pulmonary disease. Electronically Signed   By: Lucienne Capers M.D.   On: 03/20/2020 16:43   US Venous Img Upper Uni Right(DVT)  Result Date: 03/20/2020 CLINICAL DATA:  Right arm swelling. Abrasion to the right wrist with possible infection yesterday. Worsening today. Patient is on chemotherapy for CLL. Pain and edema with color changes. Anticoagulation therapy. EXAM: Right UPPER EXTREMITY VENOUS DOPPLER ULTRASOUND TECHNIQUE: Gray-scale sonography with graded compression, as well as color Doppler and duplex ultrasound were performed to evaluate the upper extremity deep venous system from the level of the subclavian vein and including the jugular, axillary, basilic, radial, ulnar and upper cephalic vein. Spectral Doppler was utilized to evaluate flow at rest and with distal augmentation maneuvers. COMPARISON:  None. FINDINGS: Contralateral Subclavian Vein: Respiratory phasicity is normal and symmetric with the symptomatic side. No evidence of thrombus. Normal compressibility. Internal Jugular Vein: No evidence of thrombus. Normal compressibility,  respiratory phasicity and response to augmentation. Subclavian Vein: No evidence of thrombus. Normal compressibility, respiratory phasicity and response to augmentation. Axillary Vein: No evidence of thrombus. Normal compressibility, respiratory phasicity and response to augmentation. Cephalic Vein: No evidence of thrombus. Normal compressibility, respiratory phasicity and response to augmentation. Basilic Vein: No evidence of thrombus. Normal compressibility, respiratory phasicity and response to augmentation. Brachial Veins: No evidence of thrombus. Normal compressibility, respiratory phasicity and response to augmentation. Radial Veins: No evidence of thrombus. Normal compressibility, respiratory phasicity and response to augmentation. Ulnar Veins: No evidence of thrombus. Normal compressibility, respiratory phasicity and response to augmentation. Venous Reflux:  None visualized. Other Findings: Contralateral left subclavian vein is also evaluated and is patent. IMPRESSION: No evidence of DVT within the right upper extremity. Electronically Signed   By: Lucienne Capers M.D.   On: 03/20/2020 16:41    ____________________________________________   PROCEDURES .Critical Care Performed by: Carrie Mew, MD Authorized by: Carrie Mew, MD   Critical care provider statement:    Critical care time (minutes):  33   Critical care time was exclusive of:  Separately billable procedures and treating other patients   Critical care was necessary to treat or prevent imminent or life-threatening deterioration of the following conditions:  Sepsis   Critical care was time spent personally by me on the following activities:  Development of treatment plan with patient or surrogate, discussions with consultants, evaluation of patient's response to treatment, examination of patient, obtaining history from patient or surrogate, ordering and performing treatments and interventions, ordering and review of  laboratory studies, ordering and review of radiographic studies, pulse oximetry, re-evaluation of patient's condition and review of old charts    ____________________________________________    CLINICAL IMPRESSION / ASSESSMENT AND PLAN / ED COURSE  Medications ordered in the ED: Medications  meropenem (MERREM) 1 g in sodium chloride 0.9 % 100 mL IVPB (0 g Intravenous Stopped 03/20/20 1800)  acetaminophen (TYLENOL) tablet 650 mg (650  mg Oral Given 03/20/20 1238)  sodium chloride 0.9 % bolus 1,000 mL (1,000 mLs Intravenous Bolus from Bag 03/20/20 1719)  vancomycin (VANCOCIN) IVPB 1000 mg/200 mL premix (1,000 mg Intravenous New Bag/Given 03/20/20 1802)  morphine 2 MG/ML injection 2 mg (2 mg Intravenous Given 03/20/20 1834)  ondansetron (ZOFRAN) injection 4 mg (4 mg Intravenous Given 03/20/20 1836)    Pertinent labs & imaging results that were available during my care of the patient were reviewed by me and considered in my medical decision making (see chart for details).  Addilee Heckard was evaluated in Emergency Department on 03/20/2020 for the symptoms described in the history of present illness. She was evaluated in the context of the global COVID-19 pandemic, which necessitated consideration that the patient might be at risk for infection with the SARS-CoV-2 virus that causes COVID-19. Institutional protocols and algorithms that pertain to the evaluation of patients at risk for COVID-19 are in a state of rapid change based on information released by regulatory bodies including the CDC and federal and state organizations. These policies and algorithms were followed during the patient's care in the ED.   Patient presents with cellulitis of the right hand and forearm originating from a small wound on the dorsal hand.  No evidence of abscess or necrotizing fasciitis or osteomyelitis.  With fever and tachycardia, presentation is worrisome for sepsis.  White blood cell count is about 90,000, increased  from 44,000 3 days ago.  Lactic acid level is normal.  Patient also has thrombocytopenia.  Due to immunocompromise from CLL and chemotherapy, cellulitis with signs of sepsis, she will need to be started on broad-spectrum IV antibiotics with meropenem and vancomycin (history of ESBL), and be admitted to the hospital.  Clinical Course as of 03/20/20 2041  Fri Mar 20, 2020  1759 Chest x-ray unremarkable.  Ultrasound right upper extremity negative for DVT. [PS]  1921 Case discussed with hospitalist for further management.  Discussed with oncology Dr. Grayland Ormond who advises that this can be managed as usual for cellulitis and sepsis, no specific additional recommendations at this time.. [PS]  1952 Hospitalist request surgical consult to evaluate for possible necrotizing fasciitis given pain and progression. Cellulitis does extend slightly proximal to the elbow at this point. Surgery paged, Dr. Hampton Abbot is currently in OR. Message left with your nurse. [PS]  2040 Discussed with Dr. Hampton Abbot who feels infection is unlikely necrotizing, recommend continuing IV antibiotics for now and reassess tomorrow.  He is marking the cellulitis perimeter for follow-up. [PS]    Clinical Course User Index [PS] Carrie Mew, MD     ____________________________________________   FINAL CLINICAL IMPRESSION(S) / ED DIAGNOSES    Final diagnoses:  Arm swelling  Cellulitis of right upper extremity  CLL (chronic lymphocytic leukemia) ([facility])  Sepsis, due to unspecified organism, unspecified whether acute organ dysfunction present [facility])     ED Discharge Orders    None      Portions of this note were generated with dragon dictation software. Dictation errors may occur despite best attempts at proofreading.   Carrie Mew, MD 03/20/20 Drema Halon

## 2020-03-20 NOTE — ED Notes (Signed)
Patient transported to Ultrasound and then xray

## 2020-03-20 NOTE — ED Notes (Signed)
Patient given water and sandwich tray 

## 2020-03-20 NOTE — ED Notes (Signed)
Patient assisted to and from bathroom to urinate with use of wheelchair.

## 2020-03-21 ENCOUNTER — Encounter: Payer: Self-pay | Admitting: Internal Medicine

## 2020-03-21 ENCOUNTER — Inpatient Hospital Stay: Payer: Medicare Other

## 2020-03-21 DIAGNOSIS — L03113 Cellulitis of right upper limb: Secondary | ICD-10-CM | POA: Diagnosis not present

## 2020-03-21 DIAGNOSIS — C8318 Mantle cell lymphoma, lymph nodes of multiple sites: Secondary | ICD-10-CM

## 2020-03-21 DIAGNOSIS — M7989 Other specified soft tissue disorders: Secondary | ICD-10-CM | POA: Insufficient documentation

## 2020-03-21 DIAGNOSIS — R7881 Bacteremia: Secondary | ICD-10-CM | POA: Diagnosis not present

## 2020-03-21 LAB — URINALYSIS, COMPLETE (UACMP) WITH MICROSCOPIC
Bilirubin Urine: NEGATIVE
Glucose, UA: NEGATIVE mg/dL
Ketones, ur: 5 mg/dL — AB
Leukocytes,Ua: NEGATIVE
Nitrite: POSITIVE — AB
Protein, ur: NEGATIVE mg/dL
Specific Gravity, Urine: 1.008 (ref 1.005–1.030)
Squamous Epithelial / HPF: NONE SEEN (ref 0–5)
pH: 7 (ref 5.0–8.0)

## 2020-03-21 LAB — HEMOGLOBIN A1C
Hgb A1c MFr Bld: 6.7 % — ABNORMAL HIGH (ref 4.8–5.6)
Mean Plasma Glucose: 145.59 mg/dL

## 2020-03-21 LAB — BLOOD CULTURE ID PANEL (REFLEXED) - BCID2

## 2020-03-21 LAB — COMPREHENSIVE METABOLIC PANEL
ALT: 29 U/L (ref 0–44)
AST: 16 U/L (ref 15–41)
Albumin: 2.7 g/dL — ABNORMAL LOW (ref 3.5–5.0)
Alkaline Phosphatase: 73 U/L (ref 38–126)
Anion gap: 11 (ref 5–15)
BUN: 17 mg/dL (ref 8–23)
CO2: 21 mmol/L — ABNORMAL LOW (ref 22–32)
Calcium: 7.2 mg/dL — ABNORMAL LOW (ref 8.9–10.3)
Chloride: 100 mmol/L (ref 98–111)
Creatinine, Ser: 0.63 mg/dL (ref 0.44–1.00)
GFR, Estimated: >60 mL/min (ref >60)
Glucose, Bld: 130 mg/dL — ABNORMAL HIGH (ref 70–99)
Potassium: 3.1 mmol/L — ABNORMAL LOW (ref 3.5–5.1)
Sodium: 132 mmol/L — ABNORMAL LOW (ref 135–145)
Total Bilirubin: 1.7 mg/dL — ABNORMAL HIGH (ref 0.3–1.2)
Total Protein: 5.1 g/dL — ABNORMAL LOW (ref 6.5–8.1)

## 2020-03-21 LAB — SARS CORONAVIRUS 2 (TAT 6-24 HRS): SARS Coronavirus 2: NEGATIVE

## 2020-03-21 LAB — CBC
HCT: 24.7 % — ABNORMAL LOW (ref 36.0–46.0)
Hemoglobin: 8.1 g/dL — ABNORMAL LOW (ref 12.0–15.0)
MCH: 29.8 pg (ref 26.0–34.0)
MCHC: 32.8 g/dL (ref 30.0–36.0)
MCV: 90.8 fL (ref 80.0–100.0)
Platelets: 24 10*3/uL — CL (ref 150–400)
RBC: 2.72 MIL/uL — ABNORMAL LOW (ref 3.87–5.11)
RDW: 19.1 % — ABNORMAL HIGH (ref 11.5–15.5)
WBC: 66.1 10*3/uL (ref 4.0–10.5)
nRBC: 0 % (ref 0.0–0.2)

## 2020-03-21 LAB — GLUCOSE, CAPILLARY
Glucose-Capillary: 158 mg/dL — ABNORMAL HIGH (ref 70–99)
Glucose-Capillary: 89 mg/dL (ref 70–99)

## 2020-03-21 LAB — CBG MONITORING, ED: Glucose-Capillary: 113 mg/dL — ABNORMAL HIGH (ref 70–99)

## 2020-03-21 MED ORDER — IOHEXOL 300 MG/ML  SOLN
75.0000 mL | Freq: Once | INTRAMUSCULAR | Status: AC | PRN
  Administered 2020-03-21: 75 mL via INTRAVENOUS
  Filled 2020-03-21: qty 75

## 2020-03-21 MED ORDER — SODIUM CHLORIDE 0.9 % IV SOLN
1.0000 g | Freq: Two times a day (BID) | INTRAVENOUS | Status: DC
  Administered 2020-03-21 – 2020-03-25 (×9): 1 g via INTRAVENOUS
  Filled 2020-03-21 (×10): qty 1

## 2020-03-21 NOTE — Progress Notes (Addendum)
PROGRESS NOTE    Jacqueline Russo  [id number] DOB: December 22, 1929 DOA: 03/20/2020 PCP: [name], MD    Assessment & Plan:   Principal Problem:   Cellulitis Active Problems:   CLL (chronic lymphocytic leukemia) ([place])   DM type 2 with diabetic mixed hyperlipidemia ([place])   Sepsis ([place])   Abnormal CT of brain   Jacqueline Russo is a 87 y.o. female with medical history significant of CLL with chemo, peripheral neuropathy, hyperlipidemia, gout type 2 diabetes, CKD stage III, vitamin B 12 deficiency, MDS, carotid stenosis, malnutrition presented with right hand, arm pain, swollen.   # GNR bacteremia --2/2 blood cx pos for GNR --started on meropenem and vanc on presentation Plan: --cont meropenem --repeat blood cx tomorrow morning   # Right hand, forearm swollen 2/2 cellulitis.   --ED started on meropenem and vancomycin,  --GenSurg consulted for concern of necrotizing soft tissue infection --CT RUE showed no abscess Plan: --cont vanc/meropenem  # Sepsis 2/2 bacteremia and celluitis In the setting of immunocompromise given history of of CLL Evidence of fever, tachycardia Lactate 1.3 IVF giving in ED Plan: --cont vanc/meropem --cont gentle MIVF  #DM:  SSI  # Stage IV mantle cell lymphoma  Oncology consulted, Dr. Kirke Corin saw pt  Anemia:  Patient received blood transfusion this past week. Current hemoglobin is 8.1. --transfuse to keep Hgb >=8, per oncology --Patient requires Desferal with all of her transfusions.  Thrombocytopenia:  Multifactorial from malignancy and recent chemotherapy. Platelet count is 24.  --transfuse to keep plt >-10  #Abnormal CT of brain:  Question age undefined ischemic change, However patient has no history of CVA, currently no focal neuro deficit Also question if any brain malignancy Will get MRI of brain when patient more stable  #history significant of CLL with chemo, peripheral neuropathy, hyperlipidemia, gout type 2 diabetes, CKD  stage III, vitamin B 12 deficiency, MDS, carotid stenosis, malnutrition     DVT prophylaxis: SCD/Compression stockings Code Status: Full code  Family Communication:  Status is: inpatient Dispo:   The patient is from: home Anticipated d/c is to: home Anticipated d/c date is: > 3 days Patient currently is not medically stable to d/c due to: sepsis with bacteremia, need to be on IV abx    Subjective and Interval History:  Pt reported some pain and swelling in her left arm, otherwise doing ok.  No chest pain, no dyspnea.   Objective: Vitals:   03/21/20 0945 03/21/20 1151 03/21/20 1958 03/22/20 0434  BP:  (!) 152/94 (!) 160/63 (!) 122/52  Pulse: (!) 106 (!) 102 (!) 110 96  Resp:  16 20 18   Temp:  98.9 F (37.2 C) 98.7 F (37.1 C) 98.4 F (36.9 C)  TempSrc:  Oral  Oral  SpO2: 99% 99% 99% 98%  Weight:      Height:        Intake/Output Summary (Last 24 hours) at 03/22/2020 0720 Last data filed at 03/22/2020 0433 Gross per 24 hour  Intake 200.03 ml  Output --  Net 200.03 ml   Filed Weights   03/20/20 1219  Weight: 44 kg    Examination:   Constitutional: NAD, AAOx3 HEENT: conjunctivae and lids normal, EOMI, visually impaired CV: No cyanosis.   RESP: normal respiratory effort, on RA Extremities: right arm edematous, erythematous and tender SKIN: warm, dry and intact Neuro: II - XII grossly intact.   Psych: Normal mood and affect.  Appropriate judgement and reason   Data Reviewed: I have personally reviewed following labs  and imaging studies  CBC: Recent Labs  Lab 03/16/20 0828 03/17/20 0921 03/17/20 1233 03/19/20 0824 03/20/20 1239 03/21/20 0753 03/22/20 0439  WBC 201.4* 44.7*  --  108.3* 89.2* 66.1* 69.2*  NEUTROABS 4.8 8.1*  --  6.0 3.7  --   --   HGB 6.0* 5.4*  --  7.6* 10.4* 8.1* 7.9*  HCT 21.1* 17.0*  --  23.7* 31.5* 24.7* 24.7*  MCV 106.0* 100.0  --  96.3 90.5 90.8 92.5  PLT 45* 10* 47* 46* 34* 24* 24*   Basic Metabolic Panel: Recent Labs  Lab  03/17/20 0921 03/19/20 0824 03/20/20 1239 03/21/20 0753 03/22/20 0439  NA 131* 137 132* 132* 133*  K 4.1 3.0* 3.3* 3.1* 2.8*  CL 102 104 97* 100 99  CO2 12* 20* 18* 21* 19*  GLUCOSE 229* 137* 118* 130* 110*  BUN 26* 22 16 17 19   CREATININE 0.88 0.65 0.64 0.63 0.85  CALCIUM 8.4* 8.4* 8.6* 7.2* 7.2*  MG 1.9 1.8  --   --  1.4*  PHOS 3.7  --   --   --   --    GFR: Estimated Creatinine Clearance: 31.8 mL/min (by C-G formula based on SCr of 0.85 mg/dL). Liver Function Tests: Recent Labs  Lab 03/16/20 0828 03/17/20 0921 03/19/20 0824 03/20/20 1239 03/21/20 0753  AST 32 46* 33 22 16  ALT 46* 52* 65* 48* 29  ALKPHOS 108 115 117 110 73  BILITOT 1.0 0.8 1.4* 2.1* 1.7*  PROT 6.7 6.4* 6.7 6.8 5.1*  ALBUMIN 4.1 3.9 4.2 4.1 2.7*   No results for input(s): LIPASE, AMYLASE in the last 168 hours. No results for input(s): AMMONIA in the last 168 hours. Coagulation Profile: No results for input(s): INR, PROTIME in the last 168 hours. Cardiac Enzymes: Recent Labs  Lab 03/20/20 1239  CKTOTAL 45   BNP (last 3 results) No results for input(s): PROBNP in the last 8760 hours. HbA1C: Recent Labs    03/21/20 0753  HGBA1C 6.7*   CBG: Recent Labs  Lab 03/20/20 2312 03/21/20 0949 03/21/20 1634 03/21/20 2114  GLUCAP 157* 113* 158* 89   Lipid Profile: No results for input(s): CHOL, HDL, LDLCALC, TRIG, CHOLHDL, LDLDIRECT in the last 72 hours. Thyroid Function Tests: No results for input(s): TSH, T4TOTAL, FREET4, T3FREE, THYROIDAB in the last 72 hours. Anemia Panel: No results for input(s): VITAMINB12, FOLATE, FERRITIN, TIBC, IRON, RETICCTPCT in the last 72 hours. Sepsis Labs: Recent Labs  Lab 03/20/20 1239  LATICACIDVEN 1.3    Recent Results (from the past 240 hour(s))  Culture, blood (routine x 2)     Status: None (Preliminary result)   Collection Time: 03/20/20  5:30 PM   Specimen: BLOOD  Result Value Ref Range Status   Specimen Description BLOOD PORTA CATH  Final    Special Requests   Final    BOTTLES DRAWN AEROBIC AND ANAEROBIC Blood Culture adequate volume   Culture  Setup Time   Final    Organism ID to follow GRAM NEGATIVE RODS IN BOTH AEROBIC AND ANAEROBIC BOTTLES CRITICAL VALUE NOTED.  VALUE IS CONSISTENT WITH PREVIOUSLY REPORTED AND CALLED VALUE. Performed at [facility], [address] [place], [place] [address]    Culture GRAM NEGATIVE RODS  Final   Report Status PENDING  Incomplete  Culture, blood (routine x 2)     Status: None (Preliminary result)   Collection Time: 03/20/20  5:30 PM   Specimen: BLOOD  Result Value Ref Range Status   Specimen  Description BLOOD LEFT ANTECUBITAL  Final   Special Requests   Final    BOTTLES DRAWN AEROBIC AND ANAEROBIC Blood Culture adequate volume   Culture  Setup Time   Final    Organism ID to follow GRAM NEGATIVE RODS IN BOTH AEROBIC AND ANAEROBIC BOTTLES CRITICAL RESULT CALLED TO, READ BACK BY AND VERIFIED WITH: NATHAN BLEWED 03/21/20 AT [place]. MF Performed at [facility], [address] [place], [place] [address]    Culture GRAM NEGATIVE RODS  Final   Report Status PENDING  Incomplete  SARS CORONAVIRUS 2 (TAT 6-24 HRS) Nasopharyngeal Nasopharyngeal Swab     Status: None   Collection Time: 03/20/20  5:30 PM   Specimen: Nasopharyngeal Swab  Result Value Ref Range Status   SARS Coronavirus 2 NEGATIVE NEGATIVE Final    Comment: (NOTE) SARS-CoV-2 target nucleic acids are NOT DETECTED.  The SARS-CoV-2 RNA is generally detectable in upper and lower respiratory specimens during the acute phase of infection. Negative results do not preclude SARS-CoV-2 infection, do not rule out co-infections with other pathogens, and should not be used as the sole basis for treatment or other patient management decisions. Negative results must be combined with clinical observations, patient history, and epidemiological information. The expected result is Negative.  Fact Sheet for  Patients: SugarRoll.be  Fact Sheet for Healthcare Providers: https://www.woods-mathews.com/  This test is not yet approved or cleared by the [place] FDA and  has been authorized for detection and/or diagnosis of SARS-CoV-2 by FDA under an Emergency Use Authorization (EUA). This EUA will remain  in effect (meaning this test can be used) for the duration of the COVID-19 declaration under Se ction 564(b)(1) of the Act, 21 U.S.C. section 360bbb-3(b)(1), unless the authorization is terminated or revoked sooner.  Performed at [facility], [place] [address] [place], [place] [address]   Blood Culture ID Panel (Reflexed)     Status: Abnormal   Collection Time: 03/20/20  5:30 PM  Result Value Ref Range Status   Enterococcus faecalis NOT DETECTED NOT DETECTED Final   Enterococcus Faecium NOT DETECTED NOT DETECTED Final   Listeria monocytogenes NOT DETECTED NOT DETECTED Final   Staphylococcus species NOT DETECTED NOT DETECTED Final   Staphylococcus aureus (BCID) NOT DETECTED NOT DETECTED Final   Staphylococcus epidermidis NOT DETECTED NOT DETECTED Final   Staphylococcus lugdunensis NOT DETECTED NOT DETECTED Final   Streptococcus species NOT DETECTED NOT DETECTED Final   Streptococcus agalactiae NOT DETECTED NOT DETECTED Final   Streptococcus pneumoniae NOT DETECTED NOT DETECTED Final   Streptococcus pyogenes NOT DETECTED NOT DETECTED Final   A.calcoaceticus-baumannii NOT DETECTED NOT DETECTED Final   Bacteroides fragilis NOT DETECTED NOT DETECTED Final   Enterobacterales DETECTED (A) NOT DETECTED Corrected    Comment:  NATHAN BELUE AT [id number] 03/21/20 MF/SDR Enterobacterales represent a large order of gram negative bacteria, not a single organism. CORRECTED ON 01/01 AT [id number]: PREVIOUSLY REPORTED AS DETECTED Enterobacterales represent a large order of gram negative bacteria, not a single organism. CRITICAL RESULT CALLED TO, READ BACK BY AND VERIFIED  WITH:  NATHAN BELUE AT [id number] 03/22/20 MF/SDR    Enterobacter cloacae complex NOT DETECTED NOT DETECTED Final   Escherichia coli DETECTED (A) NOT DETECTED Corrected    Comment: CRITICAL RESULT CALLED TO, READ BACK BY AND VERIFIED WITH:  NATHAN BELUE AT [id number] 03/21/20 MF/SDR CORRECTED ON 01/01 AT [id number]: PREVIOUSLY REPORTED AS DETECTED CRITICAL RESULT CALLED TO, READ BACK BY AND VERIFIED WITH:  NATHAN BELUE AT [id number] 03/22/20 MF/SDR  Klebsiella aerogenes NOT DETECTED NOT DETECTED Final   Klebsiella oxytoca NOT DETECTED NOT DETECTED Final   Klebsiella pneumoniae NOT DETECTED NOT DETECTED Final   Proteus species NOT DETECTED NOT DETECTED Final   Salmonella species NOT DETECTED NOT DETECTED Final   Serratia marcescens NOT DETECTED NOT DETECTED Final   Haemophilus influenzae NOT DETECTED NOT DETECTED Final   Neisseria meningitidis NOT DETECTED NOT DETECTED Final   Pseudomonas aeruginosa NOT DETECTED NOT DETECTED Final   Stenotrophomonas maltophilia NOT DETECTED NOT DETECTED Final   Candida albicans NOT DETECTED NOT DETECTED Final   Candida auris NOT DETECTED NOT DETECTED Final   Candida glabrata NOT DETECTED NOT DETECTED Final   Candida krusei NOT DETECTED NOT DETECTED Final   Candida parapsilosis NOT DETECTED NOT DETECTED Final   Candida tropicalis NOT DETECTED NOT DETECTED Final   Cryptococcus neoformans/gattii NOT DETECTED NOT DETECTED Final   CTX-M ESBL DETECTED (A) NOT DETECTED Corrected    Comment:  NATHAN BELUE AT [id number] 03/21/20 MF/SDR (NOTE) Extended spectrum beta-lactamase detected. Recommend a carbapenem as initial therapy.   CORRECTED ON 01/01 AT [id number]: PREVIOUSLY REPORTED AS DETECTED CRITICAL RESULT CALLED TO, READ BACK BY AND VERIFIED WITH:  NATHAN BELUE AT [id number] 03/22/20 MF/SDR    Carbapenem resistance IMP NOT DETECTED NOT DETECTED Final   Carbapenem resistance KPC NOT DETECTED NOT DETECTED Final   Carbapenem resistance NDM NOT DETECTED NOT DETECTED Final   Carbapenem resist OXA 48 LIKE  NOT DETECTED NOT DETECTED Final   Carbapenem resistance VIM NOT DETECTED NOT DETECTED Final    Comment: Performed at [facility], [address] [place], [place] [address]      Radiology Studies: DG Chest 1 View  Result Date: 03/20/2020 CLINICAL DATA:  Right arm pain, swelling, and redness. Sepsis and fever. EXAM: CHEST  1 VIEW COMPARISON:  10/28/2016 FINDINGS: Power port type central venous catheter with tip over the cavoatrial junction region. No pneumothorax. Shallow inspiration. Cardiac enlargement. No vascular congestion or edema. Peribronchial thickening and streaky perihilar opacities similar to prior study, likely chronic bronchitic change. Mild atelectasis in the lung bases. No pleural effusions. No pneumothorax. Calcification of the aorta. Degenerative changes in the spine and left shoulder. Postoperative change in the right shoulder. IMPRESSION: Cardiac enlargement. No evidence of active pulmonary disease. Electronically Signed   By: Lucienne Capers M.D.   On: 03/20/2020 16:43   CT HUMERUS RIGHT W CONTRAST  Result Date: 03/21/2020 CLINICAL DATA:  Soft tissue swelling, elevated white count, infection suspected EXAM: CT OF THE RIGHT HUMERUS WITHOUT CONTRAST TECHNIQUE: Multidetector CT imaging was performed according to the standard protocol. Multiplanar CT image reconstructions were also generated. COMPARISON:  None. FINDINGS: Stranding noted within the subcutaneous soft tissues in the lower upper arm just above the elbow, the elbow and upper forearm. This likely reflects cellulitis or edema. No drainable focal fluid collection. Changes of right shoulder replacement. No hardware complicating feature. No acute bony abnormality. IMPRESSION: Stranding within the subcutaneous soft tissues about the elbow suggesting cellulitis or edema. No focal fluid collection. Electronically Signed   By: Rolm Baptise M.D.   On: 03/21/2020 10:58   CT FOREARM RIGHT W CONTRAST  Result Date:  03/21/2020 CLINICAL DATA:  Soft tissue infection suspected. Swelling, elevated white count EXAM: CT OF THE RIGHT FOREARM WITH CONTRAST TECHNIQUE: Multidetector CT imaging was performed according to the standard protocol. Multiplanar CT image reconstructions were also generated. COMPARISON:  None. FINDINGS: Stranding within the subcutaneous soft tissues noted in the distal upper arm,  elbow region, and forearm compatible with cellulitis or edema. No drainable focal fluid collection. No acute bony abnormality. IMPRESSION: Stranding within the subcutaneous soft tissues throughout the forearm suggesting cellulitis or edema. No acute bony abnormality or focal fluid collection. Electronically Signed   By: Charlett Nose M.D.   On: 03/21/2020 11:00   CT HAND RIGHT W CONTRAST  Result Date: 03/21/2020 CLINICAL DATA:  Soft tissue infection suspected. Swelling. Elevated white blood cell count. EXAM: CT OF THE RIGHT HAND WITHOUT CONTRAST TECHNIQUE: Multidetector CT imaging of the right hand was performed according to the standard protocol. Multiplanar CT image reconstructions were also generated. COMPARISON:  None. FINDINGS: No focal fluid collection within the soft tissue to suggest abscess. Mild subcutaneous soft tissue stranding suggest cellulitis or edema. No acute bony abnormality. Degenerative changes in the wrist and IP joint. IMPRESSION: No acute bony abnormality.  No soft tissue drainable abscess. Electronically Signed   By: Charlett Nose M.D.   On: 03/21/2020 10:56   US Venous Img Upper Uni Right(DVT)  Result Date: 03/20/2020 CLINICAL DATA:  Right arm swelling. Abrasion to the right wrist with possible infection yesterday. Worsening today. Patient is on chemotherapy for CLL. Pain and edema with color changes. Anticoagulation therapy. EXAM: Right UPPER EXTREMITY VENOUS DOPPLER ULTRASOUND TECHNIQUE: Gray-scale sonography with graded compression, as well as color Doppler and duplex ultrasound were performed to  evaluate the upper extremity deep venous system from the level of the subclavian vein and including the jugular, axillary, basilic, radial, ulnar and upper cephalic vein. Spectral Doppler was utilized to evaluate flow at rest and with distal augmentation maneuvers. COMPARISON:  None. FINDINGS: Contralateral Subclavian Vein: Respiratory phasicity is normal and symmetric with the symptomatic side. No evidence of thrombus. Normal compressibility. Internal Jugular Vein: No evidence of thrombus. Normal compressibility, respiratory phasicity and response to augmentation. Subclavian Vein: No evidence of thrombus. Normal compressibility, respiratory phasicity and response to augmentation. Axillary Vein: No evidence of thrombus. Normal compressibility, respiratory phasicity and response to augmentation. Cephalic Vein: No evidence of thrombus. Normal compressibility, respiratory phasicity and response to augmentation. Basilic Vein: No evidence of thrombus. Normal compressibility, respiratory phasicity and response to augmentation. Brachial Veins: No evidence of thrombus. Normal compressibility, respiratory phasicity and response to augmentation. Radial Veins: No evidence of thrombus. Normal compressibility, respiratory phasicity and response to augmentation. Ulnar Veins: No evidence of thrombus. Normal compressibility, respiratory phasicity and response to augmentation. Venous Reflux:  None visualized. Other Findings: Contralateral left subclavian vein is also evaluated and is patent. IMPRESSION: No evidence of DVT within the right upper extremity. Electronically Signed   By: Burman Nieves M.D.   On: 03/20/2020 16:41     Scheduled Meds: . allopurinol  300 mg Oral Daily  . Chlorhexidine Gluconate Cloth  6 each Topical Daily  . hydrALAZINE  50 mg Oral BID  . hydroxyurea  1,000 mg Oral Q breakfast  . insulin aspart  0-5 Units Subcutaneous QHS  . insulin aspart  0-9 Units Subcutaneous TID WC  . mirtazapine  7.5 mg  Oral QHS  . sodium chloride flush  3 mL Intravenous Q12H   Continuous Infusions: . meropenem (MERREM) IV 1 g (03/22/20 0700)  . vancomycin Stopped (03/21/20 2350)     LOS: 2 days     Darlin Priestly, MD [facility] If 7PM-7AM, please contact night-coverage 03/22/2020, 7:20 AM

## 2020-03-21 NOTE — Progress Notes (Signed)
Pharmacy Antibiotic Note  Jacqueline Russo is a 87 y.o. female admitted on 03/20/2020. Pharmacy has been consulted for vancomycin dosing.  Plan: Vancomycin 1000 mg IV x 1 given in the ED. Continue with vancomycin 500 mg IV q24h to start 1/1 at 1800. Continue to follow renal function closely.  Height: 5' (152.4 cm) Weight: 44 kg (97 lb) IBW/kg (Calculated) : 45.5  Temp (24hrs), Avg:99.6 F (37.6 C), Min:99 F (37.2 C), Max:100 F (37.8 C)  Recent Labs  Lab 03/16/20 0828 03/17/20 0921 03/19/20 0824 03/20/20 1239  WBC 201.4* 44.7* 108.3* 89.2*  CREATININE 0.72 0.88 0.65 0.64  LATICACIDVEN  --   --   --  1.3    Estimated Creatinine Clearance: 33.8 mL/min (by C-G formula based on SCr of 0.64 mg/dL).    Allergies  Allergen Reactions  . Ciprofloxacin Nausea Only    Antimicrobials this admission: Vancomycin 12/31 >> Meropenem 12/31 >>   Microbiology results: 12/31 BCx: 4 of 4 with E. Coli.   CTX-M ESBL Detected  Carbapenem recommended as initial therapy. Pt currently on Meropenem 1gm q8hr, so no change in therapy is warranted at this time.  Thank you for allowing pharmacy to be a part of this patient's care.  Otelia Sergeant, PharmD, [facility] 03/21/2020 6:52 AM

## 2020-03-21 NOTE — ED Notes (Signed)
Patient transported to CT 

## 2020-03-21 NOTE — ED Notes (Signed)
Pt assisted to [facility], given cup of water. No other needs at this time

## 2020-03-21 NOTE — ED Notes (Signed)
Admitting doc rounding

## 2020-03-21 NOTE — Progress Notes (Signed)
03/21/2020  Subjective: No acute events overnight.  Patient's WBC improved today, though it's always elevated so this is an unreliable lab.  Reports pain is better today but she's getting IV pain medication as well.  Vital signs: Temp:  [99 F (37.2 C)-100 F (37.8 C)] 99.9 F (37.7 C) (12/31 1730) Pulse Rate:  [73-116] 107 (01/01 0700) Resp:  [16-20] 16 (01/01 0200) BP: (102-209)/(42-86) 150/55 (01/01 0700) SpO2:  [94 %-100 %] 98 % (01/01 0700) Weight:  [44 kg] 44 kg (12/31 1219)   Intake/Output: 12/31 0701 - 01/01 0700 In: 1046.7 [I.V.:46.7; IV Piggyback:1000] Out: -     Physical Exam: Constitutional: No acute distress Extremity:  RUE with edema from fingers extending proximally to mid upper arm.  This is extending more proximally compared to yesterday.  No crepitus palpable.  No drainage from dorsal hand wound.  Labs:  Recent Labs    03/20/20 1239 03/21/20 0753  WBC 89.2* 66.1*  HGB 10.4* 8.1*  HCT 31.5* 24.7*  PLT 34* 24*   Recent Labs    03/20/20 1239 03/21/20 0753  NA 132* 132*  K 3.3* 3.1*  CL 97* 100  CO2 18* 21*  GLUCOSE 118* 130*  BUN 16 17  CREATININE 0.64 0.63  CALCIUM 8.6* 7.2*   No results for input(s): LABPROT, INR in the last 72 hours.  Imaging: DG Chest 1 View  Result Date: 03/20/2020 CLINICAL DATA:  Right arm pain, swelling, and redness. Sepsis and fever. EXAM: CHEST  1 VIEW COMPARISON:  10/28/2016 FINDINGS: Power port type central venous catheter with tip over the cavoatrial junction region. No pneumothorax. Shallow inspiration. Cardiac enlargement. No vascular congestion or edema. Peribronchial thickening and streaky perihilar opacities similar to prior study, likely chronic bronchitic change. Mild atelectasis in the lung bases. No pleural effusions. No pneumothorax. Calcification of the aorta. Degenerative changes in the spine and left shoulder. Postoperative change in the right shoulder. IMPRESSION: Cardiac enlargement. No evidence of active  pulmonary disease. Electronically Signed   By: Lucienne Capers M.D.   On: 03/20/2020 16:43   US Venous Img Upper Uni Right(DVT)  Result Date: 03/20/2020 CLINICAL DATA:  Right arm swelling. Abrasion to the right wrist with possible infection yesterday. Worsening today. Patient is on chemotherapy for CLL. Pain and edema with color changes. Anticoagulation therapy. EXAM: Right UPPER EXTREMITY VENOUS DOPPLER ULTRASOUND TECHNIQUE: Gray-scale sonography with graded compression, as well as color Doppler and duplex ultrasound were performed to evaluate the upper extremity deep venous system from the level of the subclavian vein and including the jugular, axillary, basilic, radial, ulnar and upper cephalic vein. Spectral Doppler was utilized to evaluate flow at rest and with distal augmentation maneuvers. COMPARISON:  None. FINDINGS: Contralateral Subclavian Vein: Respiratory phasicity is normal and symmetric with the symptomatic side. No evidence of thrombus. Normal compressibility. Internal Jugular Vein: No evidence of thrombus. Normal compressibility, respiratory phasicity and response to augmentation. Subclavian Vein: No evidence of thrombus. Normal compressibility, respiratory phasicity and response to augmentation. Axillary Vein: No evidence of thrombus. Normal compressibility, respiratory phasicity and response to augmentation. Cephalic Vein: No evidence of thrombus. Normal compressibility, respiratory phasicity and response to augmentation. Basilic Vein: No evidence of thrombus. Normal compressibility, respiratory phasicity and response to augmentation. Brachial Veins: No evidence of thrombus. Normal compressibility, respiratory phasicity and response to augmentation. Radial Veins: No evidence of thrombus. Normal compressibility, respiratory phasicity and response to augmentation. Ulnar Veins: No evidence of thrombus. Normal compressibility, respiratory phasicity and response to augmentation. Venous Reflux:  None visualized. Other Findings: Contralateral left subclavian vein is also evaluated and is patent. IMPRESSION: No evidence of DVT within the right upper extremity. Electronically Signed   By: Burman Nieves M.D.   On: 03/20/2020 16:41    Assessment/Plan: This is a 87 y.o. female with RUE cellulitis and edema.  --Given some proximal extension of the edema today, will order CT RUE with IV contrast to evaluate for any possible abscess.  Depending on the findings, she may need ortho hand consult for treatment. --Continue broad spectrum IV antibiotics --Elevate RUE   Howie Ill, MD  [facility]

## 2020-03-21 NOTE — Consult Note (Signed)
[place]  [phone] [phone]  ID: Jacqueline Russo Born OB: March 13, 1930  MR#: [id number]  YFV#:[id number]  Patient Care Team: [name], MD as PCP - General (Internal Medicine) Lloyd Huger, MD as Consulting Physician (Hematology and Oncology)  CHIEF COMPLAINT: Stage IV mantle cell lymphoma, cellulitis/gram-negative rod sepsis  INTERVAL HISTORY: Patient is an 87 year old female who recently received chemotherapy with Rituxan and Treanda for the above-stated malignancy. She has also received blood and platelet transfusions this last week. Patient noted to have a skin tear on her right hand that has since become infected and blood cultures positive for E. coli. She has pain as well as weakness and fatigue, but otherwise feels well. She had no neurologic complaints. Patient had fever yesterday. She has no chest pain, shortness of breath, cough, or hemoptysis. She denies any nausea, vomiting, or constipation. She had diarrhea earlier this week. She has no urinary complaints. Patient offers no further specific complaints today.  REVIEW OF SYSTEMS:   Review of Systems  Constitutional: Positive for malaise/fatigue. Negative for fever and weight loss.  Respiratory: Negative.  Negative for cough and shortness of breath.   Cardiovascular: Positive for chest pain. Negative for leg swelling.  Gastrointestinal: Positive for diarrhea. Negative for abdominal pain.  Genitourinary: Negative.   Musculoskeletal:       Right hand/arm pain  Skin: Negative.  Negative for rash.  Neurological: Positive for weakness. Negative for dizziness, focal weakness and headaches.  Psychiatric/Behavioral: Negative.  The patient is not nervous/anxious.     As per HPI. Otherwise, a complete review of systems is negative.  PAST MEDICAL HISTORY: Past Medical History:  Diagnosis Date  . Anemia    Myelodysplastic syndrome  . Arthritis   . Hypertension   . Leukemia, chronic  lymphoid (HCC)    CLL  . Ovarian cancer ([place]) 1963    PAST SURGICAL HISTORY: Past Surgical History:  Procedure Laterality Date  . ABDOMINAL HYSTERECTOMY    . APPENDECTOMY    . BREAST SURGERY     cyst removed from right breast  . EYE SURGERY Bilateral    Cataract Extractions  . IR FLUORO GUIDE CV LINE RIGHT  06/30/2017  . REVERSE SHOULDER ARTHROPLASTY Right 10/28/2014   Procedure: REVERSE SHOULDER ARTHROPLASTY;  Surgeon: Corky Mull, MD;  Location: [facility] ORS;  Service: Orthopedics;  Laterality: Right;    FAMILY HISTORY: History reviewed. No pertinent family history.  ADVANCED DIRECTIVES (Y/N):  _0 @  HEALTH MAINTENANCE: Social History   Tobacco Use  . Smoking status: Former Smoker    Packs/day: 2.00    Types: Cigarettes    Quit date: 1963    Years since quitting: 59.0  . Smokeless tobacco: Never Used  Vaping Use  . Vaping Use: Never used  Substance Use Topics  . Alcohol use: Not Currently    Alcohol/week: 1.0 standard drink    Types: 1 Glasses of wine per week    Comment: 1 glass of wine daily  . Drug use: No     Colonoscopy:  PAP:  Bone density:  Lipid panel:  Allergies  Allergen Reactions  . Ciprofloxacin Nausea Only    Current Facility-Administered Medications  Medication Dose Route Frequency Provider Last Rate Last Admin  . acetaminophen (TYLENOL) tablet 650 mg  650 mg Oral Q6H PRN Lenna Sciara, MD       Or  . acetaminophen (TYLENOL) suppository 650 mg  650 mg Rectal Q6H PRN Lenna Sciara, MD      .  allopurinol (ZYLOPRIM) tablet 300 mg  300 mg Oral Daily Lenna Sciara, MD      . ALPRAZolam Duanne Moron) tablet 0.25 mg  0.25 mg Oral Daily PRN Lenna Sciara, MD      . aspirin EC tablet 81 mg  81 mg Oral Daily Lenna Sciara, MD      . diphenhydrAMINE (BENADRYL) capsule 25 mg  25 mg Oral QHS PRN Renda Rolls, RPH      . hydrALAZINE (APRESOLINE) tablet 50 mg  50 mg Oral BID Lenna Sciara, MD   50 mg at 03/20/20 2326  . hydroxyurea (HYDREA) capsule 1,000 mg   1,000 mg Oral Q breakfast Lenna Sciara, MD      . insulin aspart (novoLOG) injection 0-5 Units  0-5 Units Subcutaneous QHS Lenna Sciara, MD      . insulin aspart (novoLOG) injection 0-9 Units  0-9 Units Subcutaneous TID WC Lenna Sciara, MD      . lactated ringers infusion   Intravenous Continuous Lenna Sciara, MD 50 mL/hr at 03/21/20 0016 New Bag at 03/21/20 0016  . meropenem (MERREM) 1 g in sodium chloride 0.9 % 100 mL IVPB  1 g Intravenous Q8H Lenna Sciara, MD   Stopped at 03/21/20 0720  . mirtazapine (REMERON) tablet 7.5 mg  7.5 mg Oral QHS Lenna Sciara, MD   7.5 mg at 03/20/20 2325  . morphine 2 MG/ML injection 2 mg  2 mg Intravenous Q4H PRN Lenna Sciara, MD   2 mg at 03/21/20 0524  . ondansetron (ZOFRAN) tablet 8 mg  8 mg Oral BID PRN Lenna Sciara, MD      . oxyCODONE (Oxy IR/ROXICODONE) immediate release tablet 5 mg  5 mg Oral Q4H PRN Lenna Sciara, MD      . sodium chloride flush (NS) 0.9 % injection 3 mL  3 mL Intravenous Q12H Lenna Sciara, MD      . vancomycin (VANCOREADY) IVPB 500 mg/100 mL  500 mg Intravenous Q24H Lenna Sciara, MD       Current Outpatient Medications  Medication Sig Dispense Refill  . ALPRAZolam (XANAX) 0.25 MG tablet Take 1 tablet (0.25 mg total) by mouth daily as needed for anxiety. 90 tablet 0  . cyanocobalamin (,VITAMIN B-12,) 1000 MCG/ML injection Inject into the muscle.    . allopurinol (ZYLOPRIM) 300 MG tablet Take 1 tablet (300 mg total) by mouth daily. 30 tablet 0  . aspirin 81 MG tablet Take 81 mg by mouth daily.    . diphenhydrAMINE (SOMINEX) 25 MG tablet Take 25 mg by mouth at bedtime.    . diphenoxylate-atropine (LOMOTIL) 2.5-0.025 MG tablet Take 2 tablets by mouth every 6 (six) hours as needed for diarrhea or loose stools. 60 tablet 0  . etodolac (LODINE) 400 MG tablet Take 400 mg by mouth daily.    . hydrALAZINE (APRESOLINE) 50 MG tablet Take 50 mg by mouth 2 (two) times daily.    Marland Kitchen HYDROcodone-acetaminophen (NORCO/VICODIN) 5-325 MG tablet  Take 1-2 tablets by mouth every 4 (four) hours as needed for moderate pain or severe pain. 90 tablet 0  . hydroxyurea (HYDREA) 500 MG capsule Take 2 capsules (1,000 mg total) by mouth daily. May take with food to minimize GI side effects. 60 capsule 0  . lidocaine-prilocaine (EMLA) cream Apply 1 application topically as needed. Apply to port 1-2 hours prior to appointment. Cover with plastic wrap. 30 g 3  . loperamide (IMODIUM A-D) 2 MG tablet Take 1 tablet (2 mg total) by mouth 4 (four) times daily  as needed for diarrhea or loose stools. 30 tablet 0  . metoCLOPramide (REGLAN) 5 MG tablet 1 tab every morning, 1 tab bedtime (Patient not taking: No sig reported)    . mirtazapine (REMERON) 7.5 MG tablet Take 1 tablet (7.5 mg total) by mouth at bedtime. 30 tablet 2  . ondansetron (ZOFRAN) 8 MG tablet Take 1 tablet (8 mg total) by mouth every 8 (eight) hours as needed for nausea or vomiting. (Patient not taking: No sig reported) 20 tablet 0  . ondansetron (ZOFRAN) 8 MG tablet Take 1 tablet (8 mg total) by mouth 2 (two) times daily as needed for refractory nausea / vomiting. Start on day 2 after bendamustine chemo. (Patient not taking: No sig reported) 60 tablet 1  . potassium chloride SA (KLOR-CON) 20 MEQ tablet Take 1 tablet (20 mEq total) by mouth 2 (two) times daily. 30 tablet 0  . prochlorperazine (COMPAZINE) 10 MG tablet Take 1 tablet (10 mg total) by mouth every 6 (six) hours as needed (Nausea or vomiting). (Patient not taking: No sig reported) 60 tablet 1  . tiZANidine (ZANAFLEX) 2 MG tablet Take 1 tablet (2 mg total) by mouth at bedtime as needed for muscle spasms. 30 tablet 0   Facility-Administered Medications Ordered in Other Encounters  Medication Dose Route Frequency Provider Last Rate Last Admin  . heparin lock flush 100 unit/mL  500 Units Intravenous Once Lloyd Huger, MD      . sodium chloride flush (NS) 0.9 % injection 10 mL  10 mL Intravenous PRN Lloyd Huger, MD   10 mL at  09/05/18 0822    OBJECTIVE: Vitals:   03/21/20 0900 03/21/20 0930  BP: (!) 144/53 (!) 157/119  Pulse: (!) 106 (!) 108  Resp:    Temp:    SpO2: 96% 100%     Body mass index is 18.94 kg/m.    ECOG FS:1 - Symptomatic but completely ambulatory  General: Thin, no acute distress. Eyes: Pink conjunctiva, anicteric sclera. HEENT: Normocephalic, moist mucous membranes. Lungs: No audible wheezing or coughing. Heart: Regular rate and rhythm. Abdomen: Soft, nontender, no obvious distention. Musculoskeletal: Significant edema and erythema of right hand/arm. Neuro: Alert, answering all questions appropriately. Cranial nerves grossly intact. Skin: No rashes or petechiae noted. Psych: Normal affect.   LAB RESULTS:  Lab Results  Component Value Date   NA 132 (L) 03/21/2020   K 3.1 (L) 03/21/2020   CL 100 03/21/2020   CO2 21 (L) 03/21/2020   GLUCOSE 130 (H) 03/21/2020   BUN 17 03/21/2020   CREATININE 0.63 03/21/2020   CALCIUM 7.2 (L) 03/21/2020   PROT 5.1 (L) 03/21/2020   ALBUMIN 2.7 (L) 03/21/2020   AST 16 03/21/2020   ALT 29 03/21/2020   ALKPHOS 73 03/21/2020   BILITOT 1.7 (H) 03/21/2020   GFRNONAA >60 03/21/2020   GFRAA >60 08/22/2019    Lab Results  Component Value Date   WBC 66.1 (HH) 03/21/2020   NEUTROABS 3.7 03/20/2020   HGB 8.1 (L) 03/21/2020   HCT 24.7 (L) 03/21/2020   MCV 90.8 03/21/2020   PLT 24 (LL) 03/21/2020     STUDIES: DG Chest 1 View  Result Date: 03/20/2020 CLINICAL DATA:  Right arm pain, swelling, and redness. Sepsis and fever. EXAM: CHEST  1 VIEW COMPARISON:  10/28/2016 FINDINGS: Power port type central venous catheter with tip over the cavoatrial junction region. No pneumothorax. Shallow inspiration. Cardiac enlargement. No vascular congestion or edema. Peribronchial thickening and streaky perihilar opacities similar to prior  study, likely chronic bronchitic change. Mild atelectasis in the lung bases. No pleural effusions. No pneumothorax.  Calcification of the aorta. Degenerative changes in the spine and left shoulder. Postoperative change in the right shoulder. IMPRESSION: Cardiac enlargement. No evidence of active pulmonary disease. Electronically Signed   By: Lucienne Capers M.D.   On: 03/20/2020 16:43   DG Wrist Complete Right  Result Date: 03/20/2020 CLINICAL DATA:  Acute right wrist pain and swelling. EXAM: RIGHT WRIST - COMPLETE 3+ VIEW COMPARISON:  None. FINDINGS: There is no evidence of fracture or dislocation. Severe narrowing of the first carpometacarpal joint is noted as well as mild narrowing of the radiocarpal joint. Calcification of the triangular fibrocartilage is noted. Soft tissues are unremarkable. IMPRESSION: Osteoarthritis is noted involving the first carpometacarpal joint and radiocarpal joint. No acute abnormality seen in the right wrist. Electronically Signed   By: Marijo Conception M.D.   On: 03/20/2020 10:15   CT Head W Wo Contrast  Addendum Date: 03/19/2020   ADDENDUM REPORT: 03/19/2020 18:16 ADDENDUM: These results were called by telephone at the time of interpretation on 03/19/2020 at 6:14 pm to Beckey Rutter NP, who verbally acknowledged these results. Electronically Signed   By: Pedro Earls M.D.   On: 03/19/2020 18:16   Result Date: 03/19/2020 CLINICAL DATA:  Change in mental status. 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 Fall. EXAM: CT HEAD WITHOUT AND WITH CONTRAST TECHNIQUE: Contiguous axial images were obtained from the base of the skull through the vertex without and with intravenous contrast CONTRAST:  11m OMNIPAQUE IOHEXOL 300 MG/ML  SOLN COMPARISON:  Head CT October 20, 2012. FINDINGS: Brain: A 9 mm hypodense focus in the anterior right thalamus, may represent ischemia, age indeterminate. No focus of abnormal contrast enhancement. No hemorrhage, hydrocephalus, mass effect or extra-axial collection. Vascular: No hyperdense vessel prominent calcified plaques in the bilateral vertebral  arteries and carotid siphons. Skull: Normal. Negative for fracture or focal lesion. Sinuses/Orbits: No acute finding. IMPRESSION: 1. Hypodense focus in the anterior right thalamus, may represent ischemia, age indeterminate. MRI could be used to further evaluate. 2. No focus of abnormal contrast enhancement. 3. Prominent calcified plaques in the bilateral vertebral arteries and carotid siphons. Electronically Signed: By: KPedro EarlsM.D. On: 03/19/2020 17:30   CT HUMERUS RIGHT W CONTRAST  Result Date: 03/21/2020 CLINICAL DATA:  Soft tissue swelling, elevated white count, infection suspected EXAM: CT OF THE RIGHT HUMERUS WITHOUT CONTRAST TECHNIQUE: Multidetector CT imaging was performed according to the standard protocol. Multiplanar CT image reconstructions were also generated. COMPARISON:  None. FINDINGS: Stranding noted within the subcutaneous soft tissues in the lower upper arm just above the elbow, the elbow and upper forearm. This likely reflects cellulitis or edema. No drainable focal fluid collection. Changes of right shoulder replacement. No hardware complicating feature. No acute bony abnormality. IMPRESSION: Stranding within the subcutaneous soft tissues about the elbow suggesting cellulitis or edema. No focal fluid collection. Electronically Signed   By: KRolm BaptiseM.D.   On: 03/21/2020 10:58   CT FOREARM RIGHT W CONTRAST  Result Date: 03/21/2020 CLINICAL DATA:  Soft tissue infection suspected. Swelling, elevated white count EXAM: CT OF THE RIGHT FOREARM WITH CONTRAST TECHNIQUE: Multidetector CT imaging was performed according to the standard protocol. Multiplanar CT image reconstructions were also generated. COMPARISON:  None. FINDINGS: Stranding within the subcutaneous soft tissues noted in the distal upper arm, elbow region, and forearm compatible with cellulitis or edema. No drainable focal fluid collection. No acute  bony abnormality. IMPRESSION: Stranding within the  subcutaneous soft tissues throughout the forearm suggesting cellulitis or edema. No acute bony abnormality or focal fluid collection. Electronically Signed   By: Rolm Baptise M.D.   On: 03/21/2020 11:00   CT HAND RIGHT W CONTRAST  Result Date: 03/21/2020 CLINICAL DATA:  Soft tissue infection suspected. Swelling. Elevated white blood cell count. EXAM: CT OF THE RIGHT HAND WITHOUT CONTRAST TECHNIQUE: Multidetector CT imaging of the right hand was performed according to the standard protocol. Multiplanar CT image reconstructions were also generated. COMPARISON:  None. FINDINGS: No focal fluid collection within the soft tissue to suggest abscess. Mild subcutaneous soft tissue stranding suggest cellulitis or edema. No acute bony abnormality. Degenerative changes in the wrist and IP joint. IMPRESSION: No acute bony abnormality.  No soft tissue drainable abscess. Electronically Signed   By: Rolm Baptise M.D.   On: 03/21/2020 10:56   US Venous Img Upper Uni Right(DVT)  Result Date: 03/20/2020 CLINICAL DATA:  Right arm swelling. Abrasion to the right wrist with possible infection yesterday. Worsening today. Patient is on chemotherapy for CLL. Pain and edema with color changes. Anticoagulation therapy. EXAM: Right UPPER EXTREMITY VENOUS DOPPLER ULTRASOUND TECHNIQUE: Gray-scale sonography with graded compression, as well as color Doppler and duplex ultrasound were performed to evaluate the upper extremity deep venous system from the level of the subclavian vein and including the jugular, axillary, basilic, radial, ulnar and upper cephalic vein. Spectral Doppler was utilized to evaluate flow at rest and with distal augmentation maneuvers. COMPARISON:  None. FINDINGS: Contralateral Subclavian Vein: Respiratory phasicity is normal and symmetric with the symptomatic side. No evidence of thrombus. Normal compressibility. Internal Jugular Vein: No evidence of thrombus. Normal compressibility, respiratory phasicity and  response to augmentation. Subclavian Vein: No evidence of thrombus. Normal compressibility, respiratory phasicity and response to augmentation. Axillary Vein: No evidence of thrombus. Normal compressibility, respiratory phasicity and response to augmentation. Cephalic Vein: No evidence of thrombus. Normal compressibility, respiratory phasicity and response to augmentation. Basilic Vein: No evidence of thrombus. Normal compressibility, respiratory phasicity and response to augmentation. Brachial Veins: No evidence of thrombus. Normal compressibility, respiratory phasicity and response to augmentation. Radial Veins: No evidence of thrombus. Normal compressibility, respiratory phasicity and response to augmentation. Ulnar Veins: No evidence of thrombus. Normal compressibility, respiratory phasicity and response to augmentation. Venous Reflux:  None visualized. Other Findings: Contralateral left subclavian vein is also evaluated and is patent. IMPRESSION: No evidence of DVT within the right upper extremity. Electronically Signed   By: Lucienne Capers M.D.   On: 03/20/2020 16:41   DG Hand Complete Right  Result Date: 03/20/2020 CLINICAL DATA:  Acute right hand pain and swelling. EXAM: RIGHT HAND - COMPLETE 3+ VIEW COMPARISON:  None. FINDINGS: There is no evidence of fracture or dislocation. Severe degenerative changes are seen involving the first carpometacarpal joint as well as the proximal and distal interphalangeal joints consistent with osteoarthritis. Soft tissues are unremarkable. IMPRESSION: Severe osteoarthritis is noted. No acute abnormality seen in the right hand. Electronically Signed   By: Marijo Conception M.D.   On: 03/20/2020 10:13   CT BONE MARROW BIOPSY & ASPIRATION  Result Date: 02/26/2020 INDICATION: History of MDS now with worsening laboratories. Please perform CT-guided bone marrow biopsy for tissue diagnostic purposes. EXAM: CT-GUIDED BONE MARROW BIOPSY AND ASPIRATION MEDICATIONS: None  ANESTHESIA/SEDATION: Fentanyl 50 mcg IV; Versed 1 mg IV Sedation Time: 10 Minutes; The patient was continuously monitored during the procedure by the interventional radiology nurse under my direct  supervision. COMPLICATIONS: None immediate. PROCEDURE: Informed consent was obtained from the patient following an explanation of the procedure, risks, benefits and alternatives. The patient understands, agrees and consents for the procedure. All questions were addressed. A time out was performed prior to the initiation of the procedure. The patient was positioned prone and non-contrast localization CT was performed of the pelvis to demonstrate the iliac marrow spaces. The operative site was prepped and draped in the usual sterile fashion. Under sterile conditions and local anesthesia, a 22 gauge spinal needle was utilized for procedural planning. Next, an 11 gauge coaxial bone biopsy needle was advanced into the left iliac marrow space. Needle position was confirmed with CT imaging. Initially, a bone marrow aspiration was performed. Next, a bone marrow biopsy was obtained with the 11 gauge outer bone marrow device. The 11 gauge coaxial bone biopsy needle was re-advanced into a slightly different location within the left iliac marrow space, positioning was confirmed with CT imaging and an additional bone marrow biopsy was obtained. The needle was removed and superficial hemostasis was obtained with manual compression. A dressing was applied. The patient tolerated the procedure well without immediate post procedural complication. IMPRESSION: Successful CT guided left iliac bone marrow aspiration and core biopsy. Electronically Signed   By: Sandi Mariscal M.D.   On: 02/26/2020 11:36    ASSESSMENT: Stage IV mantle cell lymphoma, cellulitis/gram-negative rod sepsis  PLAN:    1. Stage IV mantle cell lymphoma: Patient received cycle 1 of Rituxan and Treanda on Monday and Tuesday of this past week. Initially her white blood  cell count was greater than 200, but has now trended down and is 66.1 today. Continue Hydrea and allopurinol. Patient also has significant anemia and thrombocytopenia and required blood and platelet transfusion this past week. No intervention is needed at this time. Patient has PET scan scheduled for March 26, 2020. Currently, she has appointment in the cancer center on March 23, 2020. It is likely she will still be in the hospital at this time, therefore will arrange hospital follow-up upon discharge. 2. Cellulitis/gram-negative rod sepsis: No obvious abscess on CT scans from earlier today. Appreciate surgical input. Agree with current broad-spectrum antibiotics. Given patient's significantly elevated white blood cell count from her malignancy, this may not be an accurate assessment of her infection. Despite this, recommend continue following a daily CBC. 3. Anemia: Patient received blood transfusion this past week. Current hemoglobin is 8.1. Transfuse if hemoglobin falls below 8.0. Patient requires Desferal with all of her transfusions. 4. Thrombocytopenia: Multifactorial from malignancy and recent chemotherapy. Platelet count is 24. Continue to monitor and transfuse if platelet count falls below 10.  Appreciate consult, will follow.    Lloyd Huger, MD   03/21/2020 11:36 AM

## 2020-03-22 DIAGNOSIS — L03113 Cellulitis of right upper limb: Secondary | ICD-10-CM | POA: Diagnosis not present

## 2020-03-22 LAB — CBC
HCT: 24.7 % — ABNORMAL LOW (ref 36.0–46.0)
Hemoglobin: 7.9 g/dL — ABNORMAL LOW (ref 12.0–15.0)
MCH: 29.6 pg (ref 26.0–34.0)
MCHC: 32 g/dL (ref 30.0–36.0)
MCV: 92.5 fL (ref 80.0–100.0)
Platelets: 24 10*3/uL — CL (ref 150–400)
RBC: 2.67 MIL/uL — ABNORMAL LOW (ref 3.87–5.11)
RDW: 18.9 % — ABNORMAL HIGH (ref 11.5–15.5)
WBC: 69.2 10*3/uL (ref 4.0–10.5)
nRBC: 0 % (ref 0.0–0.2)

## 2020-03-22 LAB — TYPE AND SCREEN
ABO/RH(D): AB POS
Antibody Screen: NEGATIVE
Unit division: 0
Unit division: 0
Unit division: 0

## 2020-03-22 LAB — BPAM RBC
Blood Product Expiration Date: 202201132359
Blood Product Expiration Date: 202201182359
Blood Product Expiration Date: 202201252359
ISSUE DATE / TIME: 202112250455
ISSUE DATE / TIME: 202112301415
Unit Type and Rh: 6200
Unit Type and Rh: 6200
Unit Type and Rh: 7300

## 2020-03-22 LAB — BASIC METABOLIC PANEL
Anion gap: 15 (ref 5–15)
BUN: 19 mg/dL (ref 8–23)
CO2: 19 mmol/L — ABNORMAL LOW (ref 22–32)
Calcium: 7.2 mg/dL — ABNORMAL LOW (ref 8.9–10.3)
Chloride: 99 mmol/L (ref 98–111)
Creatinine, Ser: 0.85 mg/dL (ref 0.44–1.00)
GFR, Estimated: >60 mL/min (ref >60)
Glucose, Bld: 110 mg/dL — ABNORMAL HIGH (ref 70–99)
Potassium: 2.8 mmol/L — ABNORMAL LOW (ref 3.5–5.1)
Sodium: 133 mmol/L — ABNORMAL LOW (ref 135–145)

## 2020-03-22 LAB — GLUCOSE, CAPILLARY
Glucose-Capillary: 102 mg/dL — ABNORMAL HIGH (ref 70–99)
Glucose-Capillary: 107 mg/dL — ABNORMAL HIGH (ref 70–99)
Glucose-Capillary: 162 mg/dL — ABNORMAL HIGH (ref 70–99)
Glucose-Capillary: 134 mg/dL — ABNORMAL HIGH (ref 70–99)

## 2020-03-22 LAB — PREPARE RBC (CROSSMATCH)

## 2020-03-22 LAB — MAGNESIUM: Magnesium: 1.4 mg/dL — ABNORMAL LOW (ref 1.7–2.4)

## 2020-03-22 MED ORDER — SODIUM CHLORIDE 0.9% IV SOLUTION: Freq: Once | INTRAVENOUS | Status: DC

## 2020-03-22 MED ORDER — POTASSIUM CHLORIDE CRYS ER 20 MEQ PO TBCR
40.0000 meq | EXTENDED_RELEASE_TABLET | ORAL | Status: AC
  Administered 2020-03-22 (×2): 40 meq via ORAL
  Filled 2020-03-22 (×2): qty 2

## 2020-03-22 MED ORDER — CHLORHEXIDINE GLUCONATE CLOTH 2 % EX PADS
6.0000 | MEDICATED_PAD | Freq: Every day | CUTANEOUS | Status: DC
  Administered 2020-03-22 – 2020-03-25 (×4): 6 via TOPICAL

## 2020-03-22 MED ORDER — MAGNESIUM SULFATE 4 GM/100ML IV SOLN
4.0000 g | Freq: Once | INTRAVENOUS | Status: AC
  Administered 2020-03-22: 4 g via INTRAVENOUS
  Filled 2020-03-22: qty 100

## 2020-03-22 MED ORDER — SODIUM CHLORIDE 0.9% IV SOLUTION: Freq: Once | INTRAVENOUS | Status: AC

## 2020-03-22 MED ORDER — DEXTROSE 5 % IV SOLN
2000.0000 mg | Freq: Once | INTRAVENOUS | Status: AC
  Administered 2020-03-22: 2000 mg via INTRAVENOUS
  Filled 2020-03-22: qty 2

## 2020-03-22 NOTE — Progress Notes (Addendum)
PROGRESS NOTE    Jacqueline Russo   [id number]  DOB: February 02, 1930  PCP: [name], MD    DOA: 03/20/2020 LOS: 2   Brief Narrative   Jacqueline Russo is a 87 y.o. female with medical history significant of CLL with chemo, peripheral neuropathy, hyperlipidemia, gout type 2 diabetes, CKD stage III, vitamin B 12 deficiency, MDS, carotid stenosis, malnutrition presented with right hand, arm pain, swelling.  Also with associated severe pain and chills, fevers up to 102 F.  She'd had a small wound to the skin on her hand which progressively worsening to current state.     In the ED - temp 100 F, HR in 100's to 110's, BP uncontrolled with systolic as high as [id number].  Labs with no leukocytosis, normal lactate, mild hyponatremia and hypokelemia, WBC 89k (hx CLL), mild anemia 10.4.   Imaging - xrays of the right hand and wrist showed severe OA, no bony abnormalities.  RUE duplex U/S negative for DVT.   Treated with IV meropenem, vancomycin in the ED and admitted to hospitalist service.     Assessment & Plan   Principal Problem:   Cellulitis Active Problems:   CLL (chronic lymphocytic leukemia) (HCC)   DM type 2 with diabetic mixed hyperlipidemia (HCC)   Sepsis (HCC)   Abnormal CT of brain  E coli ESBL bacteremia --2/2 blood cx pos for GNR --started on meropenem and vanc on presentation Plan: --cont meropenem --follow repeat blood cx  Right hand,forearm swollen 2/2 cellulitis.  --ED started on meropenem and vancomycin,  --GenSurg consulted for concern of necrotizing soft tissue infection --CT scans of RUE showed no abscess or bony involvement --CT RUE showed no abscess Plan: --cont vanc/meropenem  Sepsis 2/2 bacteremia and celluitis In the setting of immunocompromise given history of of CLL Presented with fever, tachycardia in setting of cellulitis and bacteremia Lactate1.3 --Continue Vancomycin and Merrem IV  Hypokalemia - K 2.8 today - replacing with oral.  Monitor BMP  and replace as needed.  Hypomagnesemia - Mg 1.4 - replacing with IV. Monitor Mg and replace as needed.   DM: sliding scale Novolog   Stage IV mantle cell lymphomaOncology consulted, Dr. Kirke Corin saw pt  Anemia: Hbg 7.9 this AM, will transfuse 1 unit pRBC's per oncology rec, deferoxamine ordered as well. Patient received blood transfusion this past week.  --transfuse to keep Hgb >=8, per oncology --Patient requires Desferal with all of her transfusions.  Thrombocytopenia: Multifactorial from malignancy and recent chemotherapy.  Platelet count is 24 - stable today --transfuse if needed to keep plt >-10  Abnormal CT of brain: Question age undefined ischemic change, but no history of CVA,currently nofocal neuro deficit.  Also question if any brain malignancy.   Will discuss with pt and get MRI of brain once clinically improved from infection standpoint    Comorbidities #history significant ofCLL with chemo, peripheral neuropathy, hyperlipidemia, gout type 2 diabetes, CKD stage III, vitamin B 12 deficiency,MDS,carotid stenosis, malnutrition     DVT prophylaxis: SCDs Start: 03/20/20 1932   Diet:  Diet Orders (From admission, onward)    Start     Ordered   03/20/20 1935  Diet Carb Modified Fluid consistency: Thin; Room service appropriate? Yes  Diet effective now       Question Answer Comment  Diet-HS Snack? Nothing   Calorie Level Medium 1600-2000   Fluid consistency: Thin   Room service appropriate? Yes      03/20/20 1940  Code Status: Full Code    Subjective 03/22/20    Pt seen this AM at bedside.  Overall feeling well but still has severe pain with any movement of the RUE.  No fever/chills this morning.     Disposition Plan & Communication   Status is: Inpatient  Remains inpatient appropriate because:IV treatments appropriate due to intensity of illness or inability to take PO .  Requires IV antibiotics for bacteremia and severe  cellulitis  Dispo: The patient is from: Home              Anticipated d/c is to: Home              Anticipated d/c date is: 3 days              Patient currently is not medically stable to d/c.        Family Communication: none at bedside, will attempt to call    Consults, Procedures, Significant Events   Consultants:   Oncology  General Surgery  Procedures:   none  Antimicrobials:  Anti-infectives (From admission, onward)   Start     Dose/Rate Route Frequency Ordered Stop   03/21/20 1800  vancomycin (VANCOREADY) IVPB 500 mg/100 mL        500 mg 100 mL/hr over 60 Minutes Intravenous Every 24 hours 03/20/20 1945     03/21/20 1800  meropenem (MERREM) 1 g in sodium chloride 0.9 % 100 mL IVPB        1 g 200 mL/hr over 30 Minutes Intravenous Every 12 hours 03/21/20 1302     03/20/20 1545  vancomycin (VANCOCIN) IVPB 1000 mg/200 mL premix        1,000 mg 200 mL/hr over 60 Minutes Intravenous  Once 03/20/20 1537 03/20/20 2203   03/20/20 1545  meropenem (MERREM) 1 g in sodium chloride 0.9 % 100 mL IVPB  Status:  Discontinued        1 g 200 mL/hr over 30 Minutes Intravenous Every 8 hours 03/20/20 1537 03/21/20 1302        Objective   Vitals:   03/21/20 1958 03/22/20 0434 03/22/20 0858 03/22/20 1354  BP: (!) 160/63 (!) 122/52 (!) 122/45 (!) 126/52  Pulse: (!) 110 96 91 100  Resp: 20 18 18 18   Temp: 98.7 F (37.1 C) 98.4 F (36.9 C) 98.4 F (36.9 C) 97.9 F (36.6 C)  TempSrc:  Oral Oral Oral  SpO2: 99% 98% 100% 97%  Weight:      Height:        Intake/Output Summary (Last 24 hours) at 03/22/2020 1405 Last data filed at 03/22/2020 0433 Gross per 24 hour  Intake 200.03 ml  Output --  Net 200.03 ml   Filed Weights   03/20/20 1219  Weight: 44 kg    Physical Exam:  General exam: awake, alert, no acute distress HEENT: moist mucus membranes, hearing grossly normal  Respiratory system: CTAB, no wheezes, rales or rhonchi, normal respiratory effort, on room  air. Cardiovascular system: normal S1/S2, RRR, no pedal edema.   Gastrointestinal system: soft, NT, ND, +bowel sounds. Central nervous system: A&O x3. no gross focal neurologic deficits, normal speech Extremities: RUE with tense edema and what looks to be resolving erythema, no erythema beyond ink margin on upper arm, tenderness in axilla as well, very tender on lightest of palpation Psychiatry: normal mood, congruent affect, judgement and insight appear normal  Labs   Data Reviewed: I have personally reviewed following labs and imaging studies  CBC: Recent Labs  Lab 03/16/20 0828 03/17/20 0921 03/17/20 1233 03/19/20 0824 03/20/20 1239 03/21/20 0753 03/22/20 0439  WBC 201.4* 44.7*  --  108.3* 89.2* 66.1* 69.2*  NEUTROABS 4.8 8.1*  --  6.0 3.7  --   --   HGB 6.0* 5.4*  --  7.6* 10.4* 8.1* 7.9*  HCT 21.1* 17.0*  --  23.7* 31.5* 24.7* 24.7*  MCV 106.0* 100.0  --  96.3 90.5 90.8 92.5  PLT 45* 10* 47* 46* 34* 24* 24*   Basic Metabolic Panel: Recent Labs  Lab 03/17/20 0921 03/19/20 0824 03/20/20 1239 03/21/20 0753 03/22/20 0439  NA 131* 137 132* 132* 133*  K 4.1 3.0* 3.3* 3.1* 2.8*  CL 102 104 97* 100 99  CO2 12* 20* 18* 21* 19*  GLUCOSE 229* 137* 118* 130* 110*  BUN 26* 22 16 17 19   CREATININE 0.88 0.65 0.64 0.63 0.85  CALCIUM 8.4* 8.4* 8.6* 7.2* 7.2*  MG 1.9 1.8  --   --  1.4*  PHOS 3.7  --   --   --   --    GFR: Estimated Creatinine Clearance: 31.8 mL/min (by C-G formula based on SCr of 0.85 mg/dL). Liver Function Tests: Recent Labs  Lab 03/16/20 0828 03/17/20 0921 03/19/20 0824 03/20/20 1239 03/21/20 0753  AST 32 46* 33 22 16  ALT 46* 52* 65* 48* 29  ALKPHOS 108 115 117 110 73  BILITOT 1.0 0.8 1.4* 2.1* 1.7*  PROT 6.7 6.4* 6.7 6.8 5.1*  ALBUMIN 4.1 3.9 4.2 4.1 2.7*   No results for input(s): LIPASE, AMYLASE in the last 168 hours. No results for input(s): AMMONIA in the last 168 hours. Coagulation Profile: No results for input(s): INR, PROTIME in the  last 168 hours. Cardiac Enzymes: Recent Labs  Lab 03/20/20 1239  CKTOTAL 45   BNP (last 3 results) No results for input(s): PROBNP in the last 8760 hours. HbA1C: Recent Labs    03/21/20 0753  HGBA1C 6.7*   CBG: Recent Labs  Lab 03/21/20 0949 03/21/20 1634 03/21/20 2114 03/22/20 0809 03/22/20 1123  GLUCAP 113* 158* 89 102* 107*   Lipid Profile: No results for input(s): CHOL, HDL, LDLCALC, TRIG, CHOLHDL, LDLDIRECT in the last 72 hours. Thyroid Function Tests: No results for input(s): TSH, T4TOTAL, FREET4, T3FREE, THYROIDAB in the last 72 hours. Anemia Panel: No results for input(s): VITAMINB12, FOLATE, FERRITIN, TIBC, IRON, RETICCTPCT in the last 72 hours. Sepsis Labs: Recent Labs  Lab 03/20/20 1239  LATICACIDVEN 1.3    Recent Results (from the past 240 hour(s))  Culture, blood (routine x 2)     Status: Abnormal (Preliminary result)   Collection Time: 03/20/20  5:30 PM   Specimen: BLOOD  Result Value Ref Range Status   Specimen Description   Final    BLOOD PORTA CATH Performed at [facility], [address], [place], [place] [address]    Special Requests   Final    BOTTLES DRAWN AEROBIC AND ANAEROBIC Blood Culture adequate volume Performed at [facility], [address] [place], [place] [address]    Culture  Setup Time   Final    Organism ID to follow GRAM NEGATIVE RODS IN BOTH AEROBIC AND ANAEROBIC BOTTLES CRITICAL VALUE NOTED.  VALUE IS CONSISTENT WITH PREVIOUSLY REPORTED AND CALLED VALUE. Performed at [facility], [address] [place], [place] [address]    Culture ESCHERICHIA COLI (A)  Final   Report Status PENDING  Incomplete  Culture, blood (routine x 2)  Status: Abnormal (Preliminary result)   Collection Time: 03/20/20  5:30 PM   Specimen: BLOOD  Result Value Ref Range Status   Specimen Description   Final    BLOOD LEFT ANTECUBITAL Performed at [facility], [address] [place], [place]  [address]    Special Requests   Final    BOTTLES DRAWN AEROBIC AND ANAEROBIC Blood Culture adequate volume Performed at [facility], [address] [place], [place] [address]    Culture  Setup Time   Final    Organism ID to follow GRAM NEGATIVE RODS IN BOTH AEROBIC AND ANAEROBIC BOTTLES CRITICAL RESULT CALLED TO, READ BACK BY AND VERIFIED WITH: NATHAN BLEWED 03/21/20 AT 0355. MF Performed at [facility], [address] [place], [place] [address]    Culture ESCHERICHIA COLI (A)  Final   Report Status PENDING  Incomplete  SARS CORONAVIRUS 2 (TAT 6-24 HRS) Nasopharyngeal Nasopharyngeal Swab     Status: None   Collection Time: 03/20/20  5:30 PM   Specimen: Nasopharyngeal Swab  Result Value Ref Range Status   SARS Coronavirus 2 NEGATIVE NEGATIVE Final    Comment: (NOTE) SARS-CoV-2 target nucleic acids are NOT DETECTED.  The SARS-CoV-2 RNA is generally detectable in upper and lower respiratory specimens during the acute phase of infection. Negative results do not preclude SARS-CoV-2 infection, do not rule out co-infections with other pathogens, and should not be used as the sole basis for treatment or other patient management decisions. Negative results must be combined with clinical observations, patient history, and epidemiological information. The expected result is Negative.  Fact Sheet for Patients: HairSlick.no  Fact Sheet for Healthcare Providers: quierodirigir.com  This test is not yet approved or cleared by the [place] FDA and  has been authorized for detection and/or diagnosis of SARS-CoV-2 by FDA under an Emergency Use Authorization (EUA). This EUA will remain  in effect (meaning this test can be used) for the duration of the COVID-19 declaration under Se ction 564(b)(1) of the Act, 21 U.S.C. section 360bbb-3(b)(1), unless the authorization is terminated or revoked sooner.  Performed at  [facility], [address] [place], [place] [address]   Blood Culture ID Panel (Reflexed)     Status: Abnormal   Collection Time: 03/20/20  5:30 PM  Result Value Ref Range Status   Enterococcus faecalis NOT DETECTED NOT DETECTED Final   Enterococcus Faecium NOT DETECTED NOT DETECTED Final   Listeria monocytogenes NOT DETECTED NOT DETECTED Final   Staphylococcus species NOT DETECTED NOT DETECTED Final   Staphylococcus aureus (BCID) NOT DETECTED NOT DETECTED Final   Staphylococcus epidermidis NOT DETECTED NOT DETECTED Final   Staphylococcus lugdunensis NOT DETECTED NOT DETECTED Final   Streptococcus species NOT DETECTED NOT DETECTED Final   Streptococcus agalactiae NOT DETECTED NOT DETECTED Final   Streptococcus pneumoniae NOT DETECTED NOT DETECTED Final   Streptococcus pyogenes NOT DETECTED NOT DETECTED Final   A.calcoaceticus-baumannii NOT DETECTED NOT DETECTED Final   Bacteroides fragilis NOT DETECTED NOT DETECTED Final   Enterobacterales DETECTED (A) NOT DETECTED Corrected    Comment:  NATHAN BELUE AT 0355 03/21/20 MF/SDR Enterobacterales represent a large order of gram negative bacteria, not a single organism. CORRECTED ON 01/01 AT 4268: PREVIOUSLY REPORTED AS DETECTED Enterobacterales represent a large order of gram negative bacteria, not a single organism. CRITICAL RESULT CALLED TO, READ BACK BY AND VERIFIED WITH:  NATHAN BELUE AT 3419 03/22/20 MF/SDR    Enterobacter cloacae complex NOT DETECTED NOT DETECTED Final  Escherichia coli DETECTED (A) NOT DETECTED Corrected    Comment: CRITICAL RESULT CALLED TO, READ BACK BY AND VERIFIED WITH:  NATHAN BELUE AT [id number] 03/21/20 MF/SDR CORRECTED ON 01/01 AT [id number]: PREVIOUSLY REPORTED AS DETECTED CRITICAL RESULT CALLED TO, READ BACK BY AND VERIFIED WITH:  NATHAN BELUE AT [id number] 03/22/20 MF/SDR    Klebsiella aerogenes NOT DETECTED NOT DETECTED Final   Klebsiella oxytoca NOT DETECTED NOT DETECTED Final   Klebsiella pneumoniae NOT DETECTED NOT  DETECTED Final   Proteus species NOT DETECTED NOT DETECTED Final   Salmonella species NOT DETECTED NOT DETECTED Final   Serratia marcescens NOT DETECTED NOT DETECTED Final   Haemophilus influenzae NOT DETECTED NOT DETECTED Final   Neisseria meningitidis NOT DETECTED NOT DETECTED Final   Pseudomonas aeruginosa NOT DETECTED NOT DETECTED Final   Stenotrophomonas maltophilia NOT DETECTED NOT DETECTED Final   Candida albicans NOT DETECTED NOT DETECTED Final   Candida auris NOT DETECTED NOT DETECTED Final   Candida glabrata NOT DETECTED NOT DETECTED Final   Candida krusei NOT DETECTED NOT DETECTED Final   Candida parapsilosis NOT DETECTED NOT DETECTED Final   Candida tropicalis NOT DETECTED NOT DETECTED Final   Cryptococcus neoformans/gattii NOT DETECTED NOT DETECTED Final   CTX-M ESBL DETECTED (A) NOT DETECTED Corrected    Comment:  NATHAN BELUE AT [id number] 03/21/20 MF/SDR (NOTE) Extended spectrum beta-lactamase detected. Recommend a carbapenem as initial therapy.   CORRECTED ON 01/01 AT [id number]: PREVIOUSLY REPORTED AS DETECTED CRITICAL RESULT CALLED TO, READ BACK BY AND VERIFIED WITH:  NATHAN BELUE AT [id number] 03/22/20 MF/SDR    Carbapenem resistance IMP NOT DETECTED NOT DETECTED Final   Carbapenem resistance KPC NOT DETECTED NOT DETECTED Final   Carbapenem resistance NDM NOT DETECTED NOT DETECTED Final   Carbapenem resist OXA 48 LIKE NOT DETECTED NOT DETECTED Final   Carbapenem resistance VIM NOT DETECTED NOT DETECTED Final    Comment: Performed at [facility], [address] [place], [place] [address]      Imaging Studies   DG Chest 1 View  Result Date: 03/20/2020 CLINICAL DATA:  Right arm pain, swelling, and redness. Sepsis and fever. EXAM: CHEST  1 VIEW COMPARISON:  10/28/2016 FINDINGS: Power port type central venous catheter with tip over the cavoatrial junction region. No pneumothorax. Shallow inspiration. Cardiac enlargement. No vascular congestion or edema. Peribronchial  thickening and streaky perihilar opacities similar to prior study, likely chronic bronchitic change. Mild atelectasis in the lung bases. No pleural effusions. No pneumothorax. Calcification of the aorta. Degenerative changes in the spine and left shoulder. Postoperative change in the right shoulder. IMPRESSION: Cardiac enlargement. No evidence of active pulmonary disease. Electronically Signed   By: Lucienne Capers M.D.   On: 03/20/2020 16:43   CT HUMERUS RIGHT W CONTRAST  Result Date: 03/21/2020 CLINICAL DATA:  Soft tissue swelling, elevated white count, infection suspected EXAM: CT OF THE RIGHT HUMERUS WITHOUT CONTRAST TECHNIQUE: Multidetector CT imaging was performed according to the standard protocol. Multiplanar CT image reconstructions were also generated. COMPARISON:  None. FINDINGS: Stranding noted within the subcutaneous soft tissues in the lower upper arm just above the elbow, the elbow and upper forearm. This likely reflects cellulitis or edema. No drainable focal fluid collection. Changes of right shoulder replacement. No hardware complicating feature. No acute bony abnormality. IMPRESSION: Stranding within the subcutaneous soft tissues about the elbow suggesting cellulitis or edema. No focal fluid collection. Electronically Signed   By: Rolm Baptise M.D.   On: 03/21/2020 10:58   CT FOREARM RIGHT  W CONTRAST  Result Date: 03/21/2020 CLINICAL DATA:  Soft tissue infection suspected. Swelling, elevated white count EXAM: CT OF THE RIGHT FOREARM WITH CONTRAST TECHNIQUE: Multidetector CT imaging was performed according to the standard protocol. Multiplanar CT image reconstructions were also generated. COMPARISON:  None. FINDINGS: Stranding within the subcutaneous soft tissues noted in the distal upper arm, elbow region, and forearm compatible with cellulitis or edema. No drainable focal fluid collection. No acute bony abnormality. IMPRESSION: Stranding within the subcutaneous soft tissues throughout the  forearm suggesting cellulitis or edema. No acute bony abnormality or focal fluid collection. Electronically Signed   By: Rolm Baptise M.D.   On: 03/21/2020 11:00   CT HAND RIGHT W CONTRAST  Result Date: 03/21/2020 CLINICAL DATA:  Soft tissue infection suspected. Swelling. Elevated white blood cell count. EXAM: CT OF THE RIGHT HAND WITHOUT CONTRAST TECHNIQUE: Multidetector CT imaging of the right hand was performed according to the standard protocol. Multiplanar CT image reconstructions were also generated. COMPARISON:  None. FINDINGS: No focal fluid collection within the soft tissue to suggest abscess. Mild subcutaneous soft tissue stranding suggest cellulitis or edema. No acute bony abnormality. Degenerative changes in the wrist and IP joint. IMPRESSION: No acute bony abnormality.  No soft tissue drainable abscess. Electronically Signed   By: Rolm Baptise M.D.   On: 03/21/2020 10:56   US Venous Img Upper Uni Right(DVT)  Result Date: 03/20/2020 CLINICAL DATA:  Right arm swelling. Abrasion to the right wrist with possible infection yesterday. Worsening today. Patient is on chemotherapy for CLL. Pain and edema with color changes. Anticoagulation therapy. EXAM: Right UPPER EXTREMITY VENOUS DOPPLER ULTRASOUND TECHNIQUE: Gray-scale sonography with graded compression, as well as color Doppler and duplex ultrasound were performed to evaluate the upper extremity deep venous system from the level of the subclavian vein and including the jugular, axillary, basilic, radial, ulnar and upper cephalic vein. Spectral Doppler was utilized to evaluate flow at rest and with distal augmentation maneuvers. COMPARISON:  None. FINDINGS: Contralateral Subclavian Vein: Respiratory phasicity is normal and symmetric with the symptomatic side. No evidence of thrombus. Normal compressibility. Internal Jugular Vein: No evidence of thrombus. Normal compressibility, respiratory phasicity and response to augmentation. Subclavian Vein: No  evidence of thrombus. Normal compressibility, respiratory phasicity and response to augmentation. Axillary Vein: No evidence of thrombus. Normal compressibility, respiratory phasicity and response to augmentation. Cephalic Vein: No evidence of thrombus. Normal compressibility, respiratory phasicity and response to augmentation. Basilic Vein: No evidence of thrombus. Normal compressibility, respiratory phasicity and response to augmentation. Brachial Veins: No evidence of thrombus. Normal compressibility, respiratory phasicity and response to augmentation. Radial Veins: No evidence of thrombus. Normal compressibility, respiratory phasicity and response to augmentation. Ulnar Veins: No evidence of thrombus. Normal compressibility, respiratory phasicity and response to augmentation. Venous Reflux:  None visualized. Other Findings: Contralateral left subclavian vein is also evaluated and is patent. IMPRESSION: No evidence of DVT within the right upper extremity. Electronically Signed   By: Lucienne Capers M.D.   On: 03/20/2020 16:41     Medications   Scheduled Meds: . sodium chloride   Intravenous Once  . allopurinol  300 mg Oral Daily  . Chlorhexidine Gluconate Cloth  6 each Topical Daily  . hydrALAZINE  50 mg Oral BID  . hydroxyurea  1,000 mg Oral Q breakfast  . insulin aspart  0-5 Units Subcutaneous QHS  . insulin aspart  0-9 Units Subcutaneous TID WC  . mirtazapine  7.5 mg Oral QHS  . sodium chloride flush  3 mL  Intravenous Q12H   Continuous Infusions: . deferoxamine (DESFERAL) IV    . meropenem (MERREM) IV 1 g (03/22/20 0700)  . vancomycin Stopped (03/21/20 2350)       LOS: 2 days    Time spent: 30 minutes    Ezekiel Slocumb, DO [facility]  03/22/2020, 2:05 PM    If 7PM-7AM, please contact night-coverage. How to contact the [facility] Attending or Consulting provider [place] or covering provider during after hours [place], for this patient?    1. Check the care team in [facility]  and look for a) attending/consulting [facility] provider listed and b) the [facility] team listed 2. Log into [email or web address] and use [place]'s universal password to access. If you do not have the password, please contact the hospital operator. 3. Locate the [facility] provider you are looking for under [facility] and page to a number that you can be directly reached. 4. If you still have difficulty reaching the provider, please page the [facility] (Director on Call) for the Hospitalists listed on amion for assistance.

## 2020-03-22 NOTE — Hospital Course (Signed)
Jacqueline Russo is a 87 y.o. female with medical history significant of CLL with chemo, peripheral neuropathy, hyperlipidemia, gout type 2 diabetes, CKD stage III, vitamin B 12 deficiency, MDS, carotid stenosis, malnutrition presented with right hand, arm pain, swelling.  Also with associated severe pain and chills, fevers up to 102 F.  She'd had a small wound to the skin on her hand which progressively worsening to current state.     In the ED - temp 100 F, HR in 100's to 110's, BP uncontrolled with systolic as high as 209.  Labs with no leukocytosis, normal lactate, mild hyponatremia and hypokelemia, WBC 89k (hx CLL), mild anemia 10.4.   Imaging - xrays of the right hand and wrist showed severe OA, no bony abnormalities.  RUE duplex U/S negative for DVT.   Treated with IV meropenem, vancomycin in the ED and admitted to hospitalist service.

## 2020-03-23 ENCOUNTER — Inpatient Hospital Stay: Payer: Medicare Other

## 2020-03-23 ENCOUNTER — Inpatient Hospital Stay (HOSPITAL_COMMUNITY)
Admit: 2020-03-23 | Discharge: 2020-03-23 | Disposition: A | Discharge status: Home / Self Care | Payer: Medicare Other | Attending: Infectious Diseases | Admitting: Infectious Diseases

## 2020-03-23 ENCOUNTER — Inpatient Hospital Stay: Payer: Medicare Other | Admitting: Oncology

## 2020-03-23 ENCOUNTER — Inpatient Hospital Stay: Payer: Medicare Other | Admitting: Hospice and Palliative Medicine

## 2020-03-23 DIAGNOSIS — L089 Local infection of the skin and subcutaneous tissue, unspecified: Secondary | ICD-10-CM

## 2020-03-23 DIAGNOSIS — A499 Bacterial infection, unspecified: Secondary | ICD-10-CM

## 2020-03-23 DIAGNOSIS — C9110 Chronic lymphocytic leukemia of B-cell type not having achieved remission: Secondary | ICD-10-CM | POA: Diagnosis not present

## 2020-03-23 DIAGNOSIS — E1169 Type 2 diabetes mellitus with other specified complication: Secondary | ICD-10-CM | POA: Diagnosis not present

## 2020-03-23 DIAGNOSIS — R7881 Bacteremia: Secondary | ICD-10-CM

## 2020-03-23 DIAGNOSIS — L03113 Cellulitis of right upper limb: Secondary | ICD-10-CM | POA: Diagnosis not present

## 2020-03-23 DIAGNOSIS — M109 Gout, unspecified: Secondary | ICD-10-CM

## 2020-03-23 DIAGNOSIS — Z1612 Extended spectrum beta lactamase (ESBL) resistance: Secondary | ICD-10-CM

## 2020-03-23 DIAGNOSIS — B9620 Unspecified Escherichia coli [E. coli] as the cause of diseases classified elsewhere: Secondary | ICD-10-CM | POA: Diagnosis not present

## 2020-03-23 DIAGNOSIS — D72819 Decreased white blood cell count, unspecified: Secondary | ICD-10-CM

## 2020-03-23 DIAGNOSIS — E782 Mixed hyperlipidemia: Secondary | ICD-10-CM | POA: Diagnosis not present

## 2020-03-23 LAB — CBC
HCT: 24.8 % — ABNORMAL LOW (ref 36.0–46.0)
Hemoglobin: 8.2 g/dL — ABNORMAL LOW (ref 12.0–15.0)
MCH: 29.8 pg (ref 26.0–34.0)
MCHC: 33.1 g/dL (ref 30.0–36.0)
MCV: 90.2 fL (ref 80.0–100.0)
Platelets: 23 10*3/uL — CL (ref 150–400)
RBC: 2.75 MIL/uL — ABNORMAL LOW (ref 3.87–5.11)
RDW: 17.5 % — ABNORMAL HIGH (ref 11.5–15.5)
WBC: 46.8 10*3/uL — ABNORMAL HIGH (ref 4.0–10.5)
nRBC: 0 % (ref 0.0–0.2)

## 2020-03-23 LAB — BASIC METABOLIC PANEL
Anion gap: 8 (ref 5–15)
BUN: 17 mg/dL (ref 8–23)
CO2: 21 mmol/L — ABNORMAL LOW (ref 22–32)
Calcium: 7.1 mg/dL — ABNORMAL LOW (ref 8.9–10.3)
Chloride: 103 mmol/L (ref 98–111)
Creatinine, Ser: 0.46 mg/dL (ref 0.44–1.00)
GFR, Estimated: >60 mL/min (ref >60)
Glucose, Bld: 132 mg/dL — ABNORMAL HIGH (ref 70–99)
Potassium: 4 mmol/L (ref 3.5–5.1)
Sodium: 132 mmol/L — ABNORMAL LOW (ref 135–145)

## 2020-03-23 LAB — TYPE AND SCREEN
ABO/RH(D): AB POS
Antibody Screen: NEGATIVE
Unit division: 0

## 2020-03-23 LAB — CULTURE, BLOOD (ROUTINE X 2)
Special Requests: ADEQUATE
Special Requests: ADEQUATE

## 2020-03-23 LAB — BPAM PLATELET PHERESIS
Blood Product Expiration Date: 202112292359
ISSUE DATE / TIME: 202112281105
Unit Type and Rh: 6200

## 2020-03-23 LAB — GLUCOSE, CAPILLARY
Glucose-Capillary: 113 mg/dL — ABNORMAL HIGH (ref 70–99)
Glucose-Capillary: 115 mg/dL — ABNORMAL HIGH (ref 70–99)
Glucose-Capillary: 110 mg/dL — ABNORMAL HIGH (ref 70–99)
Glucose-Capillary: 114 mg/dL — ABNORMAL HIGH (ref 70–99)

## 2020-03-23 LAB — BPAM RBC
Blood Product Expiration Date: 202201182359
ISSUE DATE / TIME: 202201022256
Unit Type and Rh: 6200

## 2020-03-23 LAB — PREPARE PLATELET PHERESIS: Unit division: 0

## 2020-03-23 LAB — MAGNESIUM: Magnesium: 2.4 mg/dL (ref 1.7–2.4)

## 2020-03-23 LAB — SURGICAL PATHOLOGY

## 2020-03-23 LAB — VANCOMYCIN, TROUGH: Vancomycin Tr: 7 ug/mL — ABNORMAL LOW (ref 15–20)

## 2020-03-23 MED ORDER — SODIUM CHLORIDE 0.9% FLUSH: 10.0000 mL | INTRAVENOUS | Status: DC | PRN

## 2020-03-23 MED ORDER — METHOCARBAMOL 500 MG PO TABS
500.0000 mg | ORAL_TABLET | Freq: Three times a day (TID) | ORAL | Status: DC
  Administered 2020-03-23 – 2020-03-26 (×9): 500 mg via ORAL
  Filled 2020-03-23 (×10): qty 1

## 2020-03-23 MED ORDER — VANCOMYCIN HCL IN DEXTROSE 1-5 GM/200ML-% IV SOLN
1000.0000 mg | INTRAVENOUS | Status: DC
  Administered 2020-03-23 – 2020-03-25 (×3): 1000 mg via INTRAVENOUS
  Filled 2020-03-23 (×3): qty 200

## 2020-03-23 MED ORDER — BOOST / RESOURCE BREEZE PO LIQD CUSTOM
1.0000 | Freq: Two times a day (BID) | ORAL | Status: DC
  Administered 2020-03-24 – 2020-03-26 (×3): 1 via ORAL

## 2020-03-23 MED ORDER — SODIUM CHLORIDE 0.9% FLUSH
10.0000 mL | Freq: Two times a day (BID) | INTRAVENOUS | Status: DC
  Administered 2020-03-23 – 2020-03-24 (×3): 10 mL

## 2020-03-23 MED ORDER — ADULT MULTIVITAMIN W/MINERALS CH
1.0000 | ORAL_TABLET | Freq: Every day | ORAL | Status: DC
  Administered 2020-03-24 – 2020-03-26 (×3): 1 via ORAL
  Filled 2020-03-23 (×3): qty 1

## 2020-03-23 NOTE — Progress Notes (Addendum)
Subjective:  CC: Jacqueline Russo is a 87 y.o. female  Hospital stay day 2,   sepsis, right arm cellulitis  HPI: No acute issues overnight.  Pt states pain level same, but thinks it is more proximal now.  Hand and fingers feeling better.  ROS:  General: Denies weight loss, weight gain, fatigue, fevers, chills, and night sweats. Heart: Denies chest pain, palpitations, racing heart, irregular heartbeat, leg pain or swelling, and decreased activity tolerance. Respiratory: Denies breathing difficulty, shortness of breath, wheezing, cough, and sputum. GI: Denies change in appetite, heartburn, nausea, vomiting, constipation, diarrhea, and blood in stool. GU: Denies difficulty urinating, pain with urinating, urgency, frequency, blood in urine.   Objective:        Height: 5' (152.4 cm) Weight: 44 kg BMI (Calculated): 18.94  VSS  Constitutional :  alert, cooperative, appears stated age and no distress  Respiratory:  clear to auscultation bilaterally  Cardiovascular:  regular rate and rhythm  Gastrointestinal: soft, non-tender; bowel sounds normal; no masses,  no organomegaly.   Skin: Cool and moist. Right forearm cellulitis and swelling, still within marked boundaries from earlier.  Full ROM of fingers, almost full ROM of wrist.  Good cap refill.    Psychiatric: Normal affect, non-agitated, not confused       LABS:  Lab 03/16/20 0828 03/17/20 0921 03/17/20 1233 03/19/20 0824 03/20/20 1239 03/21/20 0753 03/22/20 0439  WBC 201.4* 44.7*  --  108.3* 89.2* 66.1* 69.2*  NEUTROABS 4.8 8.1*  --  6.0 3.7  --   --   HGB 6.0* 5.4*  --  7.6* 10.4* 8.1* 7.9*  HCT 21.1* 17.0*  --  23.7* 31.5* 24.7* 24.7*  MCV 106.0* 100.0  --  96.3 90.5 90.8 92.5  PLT 45* 10* 47* 46* 34* 24* 24*     Lab 03/17/20 0921 03/19/20 0824 03/20/20 1239 03/21/20 0753 03/22/20 0439  NA 131* 137 132* 132* 133*  K 4.1 3.0* 3.3* 3.1* 2.8*  CL 102 104 97* 100 99  CO2 12* 20* 18* 21* 19*  GLUCOSE 229* 137* 118* 130*  110*  BUN 26* 22 16 17 19   CREATININE 0.88 0.65 0.64 0.63 0.85  CALCIUM 8.4* 8.4* 8.6* 7.2* 7.2*  MG 1.9 1.8  --   --  1.4*  PHOS 3.7  --   --   --   --       RADS: CLINICAL DATA:  Soft tissue infection suspected. Swelling, elevated white count  EXAM: CT OF THE RIGHT FOREARM WITH CONTRAST  TECHNIQUE: Multidetector CT imaging was performed according to the standard protocol. Multiplanar CT image reconstructions were also generated.  COMPARISON:  None.  FINDINGS: Stranding within the subcutaneous soft tissues noted in the distal upper arm, elbow region, and forearm compatible with cellulitis or edema. No drainable focal fluid collection. No acute bony abnormality.  IMPRESSION: Stranding within the subcutaneous soft tissues throughout the forearm suggesting cellulitis or edema. No acute bony abnormality or focal fluid collection.   Electronically Signed   By: Rolm Baptise M.D.   On: 03/21/2020 11:00 Assessment:   Right arm cellulitis and sepsis.  Improving ROM and pain in hand.  Patient reports extending TTP proximal, but on palpation, she complains of pain still within borders of previously marked area. No sign of neurovascular compromise.  Recommended continued elevation of arm and IV abx treatment since no drainable fluid collection noted and chronic low plt count makes her risk candidate for surgical intervention.  Surgery will peripherally follow for now.  Call for any new questions or concerns.

## 2020-03-23 NOTE — Progress Notes (Signed)
Initial Nutrition Assessment  DOCUMENTATION CODES:   Non-severe (moderate) malnutrition in context of chronic illness  INTERVENTION:   Boost Breeze po BID, each supplement provides 250 kcal and 9 grams of protein  MVI daily   Snacks  Pt at high refeed risk; recommend monitor potassium, magnesium and phosphorus labs daily as oral intake improves.  NUTRITION DIAGNOSIS:   Moderate Malnutrition related to cancer and cancer related treatments as evidenced by moderate fat depletion,severe fat depletion,moderate muscle depletion,severe muscle depletion.  GOAL:   Patient will meet greater than or equal to 90% of their needs  MONITOR:   PO intake,Supplement acceptance,Labs,Weight trends,Skin,I & O's  REASON FOR ASSESSMENT:   Malnutrition Screening Tool    ASSESSMENT:   87 y.o. female with medical history significant of CLL with chemo, peripheral neuropathy, hyperlipidemia, gout, ovarian cancer, type 2 diabetes, CKD stage III, vitamin B 12 deficiency, MDS and carotid stenosis who is admitted with R arm cellulitis  Met with pt in room today. Pt reports poor appetite and oral intake at baseline. Pt reports that her taste has changed and that she has no desire to eat. Pt reports that she eats better at home, than in the hospital. Pt reports eating 90% of her breakfast this morning which included french toast, a banana and some grapes. Pt did not touch her lunch tray but reports that by the time she got the chance to eat it, it was cold. Pt has ordered some grapes and yogurt for dinner. Pt is on remeron which she reports did not improve her appetite at all. Pt does not drink any supplements and is unwilling to entertain the idea of supplements. Pt does report that she drinks 4oz of Kiefer daily at home and she snacks on cashews to help her get more protein. RD will add Boost Breeze as pt has never tried the clear supplements. RD will also add snacks in between meal trays to help pt meet her  estimated needs. Pt is likely at refeed risk. Per chart, pt is down 6lbs(6%) over the past 2 months. Pt reports a 15lbs weight loss over the past year.   Medications reviewed and include: allopurinol, insulin, remeron, meropenem, vancomycin   Labs reviewed: Na 132(L), K 4.0 wnl, Mg 2.4 wnl Wbc- 46.8(H), Hgb 8.2(L), Hct 24.8(L) cbgs- 113, 115 x 24 hrs AIC 6.7(H)- 1/1  NUTRITION - FOCUSED PHYSICAL EXAM:  Flowsheet Row Most Recent Value  Orbital Region Moderate depletion  Upper Arm Region Severe depletion  Thoracic and Lumbar Region Moderate depletion  Buccal Region Moderate depletion  Temple Region Moderate depletion  Clavicle Bone Region Moderate depletion  Clavicle and Acromion Bone Region Moderate depletion  Scapular Bone Region Moderate depletion  Dorsal Hand Severe depletion  Patellar Region Severe depletion  Anterior Thigh Region Severe depletion  Posterior Calf Region Severe depletion  Edema (RD Assessment) None  Hair Reviewed  Eyes Reviewed  Mouth Reviewed  Skin Reviewed  Nails Reviewed     Diet Order:   Diet Order            Diet Carb Modified Fluid consistency: Thin; Room service appropriate? Yes  Diet effective now                EDUCATION NEEDS:   Education needs have been addressed  Skin:  Skin Assessment: Reviewed RN Assessment (R arm cellulitis)  Last BM:  12/30- type 6  Height:   Ht Readings from Last 1 Encounters:  03/20/20 5' (1.524 m)  Weight:   Wt Readings from Last 1 Encounters:  03/20/20 44 kg    Ideal Body Weight:  45.45 kg  BMI:  Body mass index is 18.94 kg/m.  Estimated Nutritional Needs:   Kcal:  1200-1400kcal/day  Protein:  60-70g/day  Fluid:  1.1-1.3L/day  Koleen Distance MS, RD, LDN Please refer to [facility] for RD and/or RD on-call/weekend/after hours pager

## 2020-03-23 NOTE — Progress Notes (Signed)
PROGRESS NOTE    Jacqueline Russo   ACZ:[id number]  DOB: 06/03/1929  PCP: Danella Penton, MD    DOA: 03/20/2020 LOS: 3   Brief Narrative   Jacqueline Russo is a 87 y.o. female with medical history significant of CLL with chemo, peripheral neuropathy, hyperlipidemia, gout type 2 diabetes, CKD stage III, vitamin B 12 deficiency, MDS, carotid stenosis, malnutrition presented with right hand, arm pain, swelling.  Also with associated severe pain and chills, fevers up to 102 F.  She'd had a small wound to the skin on her hand which progressively worsening to current state.     In the ED - temp 100 F, HR in 100's to 110's, BP uncontrolled with systolic as high as 209.  Labs with no leukocytosis, normal lactate, mild hyponatremia and hypokelemia, WBC 89k (hx CLL), mild anemia 10.4.   Imaging - xrays of the right hand and wrist showed severe OA, no bony abnormalities.  RUE duplex U/S negative for DVT.   Treated with IV meropenem, vancomycin in the ED and admitted to hospitalist service.     Assessment & Plan   Principal Problem:   Cellulitis Active Problems:   CLL (chronic lymphocytic leukemia) (HCC)   DM type 2 with diabetic mixed hyperlipidemia (HCC)   Sepsis (HCC)   Abnormal CT of brain   Sepsis 2/2 bacteremia and celluitis In the setting of immunocompromise given history of of CLL Presented with fever, tachycardia in setting of cellulitis and bacteremia Lactate1.3 --Continue Vancomycin and Merrem IV  E coli ESBL bacteremia --started on meropenem and vanc on admission Plan: --Antibiotics as above --Infectious disease consulted --follow repeat blood cx  Right hand,forearm swollen 2/2 cellulitis.  --ED started on meropenem and vancomycin,  --GenSurg consulted for concern of necrotizing soft tissue infection --CT scans of RUE showed no abscess or bony involvement --CT RUE showed no abscess Plan: --cont antibiotics as above --Surgery following   Hypokalemia -replaced on  1/2.  Monitor BMP and replace as needed.  Hypomagnesemia -replaced on 1/2. Monitor Mg and replace as needed.   DM: sliding scale Novolog   Stage IV mantle cell lymphomaOncology consulted, Dr. Christiane Ha saw pt  Anemia: Hbg 7.9 this AM, will transfuse 1 unit pRBC's per oncology rec, deferoxamine ordered as well. Patient received blood transfusion this past week.  --transfuse to keep Hgb >=8, per oncology --Patient requires Desferal with all of her transfusions.  Thrombocytopenia: Multifactorial from malignancy and recent chemotherapy.  Platelet count is 24 - stable today --transfuse if needed to keep plt >-10  Abnormal CT of brain: Question age undefined ischemic change, but no history of CVA,currently nofocal neuro deficit.  Also question if any brain malignancy.   Will discuss with pt and get MRI of brain once clinically improved from infection standpoint  Leukocytosis -due to CLL, trending downward on antibiotics   Comorbidities  History significant ofCLL with chemo /history of MDS -follows Dr. Orlie Dakin.  Management of cytopenias as above.  Outpatient follow-up.  Peripheral neuropathy -appears not on medication PTA.  Monitor.  Consider trial of low-dose gabapentin if needed.  Hyperlipidemia -appears not to be on statin.  Continue aspirin  Gout  -currently stable without acute flare.  Takes etodolac and allopurinol outpatient.  Continue allopurinol.  Type 2 diabetes -sliding scale NovoLog  CKD stage IIIa -renal function is currently normal  Vitamin B 12 deficiency -receives monthly IM injection  carotid stenosis -stable no acute issue  Malnutrition  -chronic.  Dietitian consulted   DVT prophylaxis: SCDs  Start: 03/20/20 1932   Diet:  Diet Orders (From admission, onward)    Start     Ordered   03/20/20 1935  Diet Carb Modified Fluid consistency: Thin; Room service appropriate? Yes  Diet effective now       Question Answer Comment  Diet-HS Snack? Nothing    Calorie Level Medium 1600-2000   Fluid consistency: Thin   Room service appropriate? Yes      03/20/20 1940            Code Status: Full Code    Subjective 03/23/20    Patient seen this morning at bedside.  Reports intermittent severe spasms in her right arm that wake her up from sleep (both overnight and if napping during the day).  Otherwise the right arm is painful with any movement, okay at rest.  No fevers or chills.  Appetite a little better.   Disposition Plan & Communication   Status is: Inpatient  Remains inpatient appropriate because:IV treatments appropriate due to intensity of illness or inability to take PO .  Requires IV antibiotics for bacteremia and severe cellulitis as above.  Dispo: The patient is from: Home              Anticipated d/c is to: Home              Anticipated d/c date is: 3 days              Patient currently is not medically stable to d/c.     Consults, Procedures, Significant Events   Consultants:   Oncology  General Surgery  Infectious disease  Procedures:   none  Antimicrobials:  Anti-infectives (From admission, onward)   Start     Dose/Rate Route Frequency Ordered Stop   03/21/20 1800  vancomycin (VANCOREADY) IVPB 500 mg/100 mL        500 mg 100 mL/hr over 60 Minutes Intravenous Every 24 hours 03/20/20 1945     03/21/20 1800  meropenem (MERREM) 1 g in sodium chloride 0.9 % 100 mL IVPB        1 g 200 mL/hr over 30 Minutes Intravenous Every 12 hours 03/21/20 1302     03/20/20 1545  vancomycin (VANCOCIN) IVPB 1000 mg/200 mL premix        1,000 mg 200 mL/hr over 60 Minutes Intravenous  Once 03/20/20 1537 03/20/20 2203   03/20/20 1545  meropenem (MERREM) 1 g in sodium chloride 0.9 % 100 mL IVPB  Status:  Discontinued        1 g 200 mL/hr over 30 Minutes Intravenous Every 8 hours 03/20/20 1537 03/21/20 1302        Objective   Vitals:   03/23/20 0239 03/23/20 0448 03/23/20 1003 03/23/20 1216  BP: (!) 161/72 133/62 (!)  134/53 (!) 152/64  Pulse: (!) 101 97 96 97  Resp: 18 16 17 18   Temp: 98.5 F (36.9 C) 98.4 F (36.9 C) 97.8 F (36.6 C) 97.8 F (36.6 C)  TempSrc: Oral Oral Oral Oral  SpO2: 95% 93% 100% 96%  Weight:      Height:        Intake/Output Summary (Last 24 hours) at 03/23/2020 1536 Last data filed at 03/23/2020 0245 Gross per 24 hour  Intake 369 ml  Output -  Net 369 ml   Filed Weights   03/20/20 1219  Weight: 44 kg    Physical Exam:  General exam: awake, alert, no acute distress Respiratory system: normal respiratory effort, on room  air. Cardiovascular system: normal S1/S2, RRR, no pedal edema.   Central nervous system: A&O x3. no gross focal neurologic deficits, normal speech Extremities: RUE still very tender on light palpation, edema less tense today, erythema is stable to mildly improved, RUE with differential warmth on palpation as well Psychiatry: normal mood, congruent affect, judgement and insight appear normal  Labs   Data Reviewed: I have personally reviewed following labs and imaging studies  CBC: Recent Labs  Lab 03/17/20 0921 03/17/20 1233 03/19/20 0824 03/20/20 1239 03/21/20 0753 03/22/20 0439 03/23/20 0529  WBC 44.7*  --  108.3* 89.2* 66.1* 69.2* 46.8*  NEUTROABS 8.1*  --  6.0 3.7  --   --   --   HGB 5.4*  --  7.6* 10.4* 8.1* 7.9* 8.2*  HCT 17.0*  --  23.7* 31.5* 24.7* 24.7* 24.8*  MCV 100.0  --  96.3 90.5 90.8 92.5 90.2  PLT 10*   < > 46* 34* 24* 24* 23*   < > = values in this interval not displayed.   Basic Metabolic Panel: Recent Labs  Lab 03/17/20 0921 03/19/20 0824 03/20/20 1239 03/21/20 0753 03/22/20 0439 03/23/20 0529  NA 131* 137 132* 132* 133* 132*  K 4.1 3.0* 3.3* 3.1* 2.8* 4.0  CL 102 104 97* 100 99 103  CO2 12* 20* 18* 21* 19* 21*  GLUCOSE 229* 137* 118* 130* 110* 132*  BUN 26* 22 16 17 19 17   CREATININE 0.88 0.65 0.64 0.63 0.85 0.46  CALCIUM 8.4* 8.4* 8.6* 7.2* 7.2* 7.1*  MG 1.9 1.8  --   --  1.4* 2.4  PHOS 3.7  --   --   --    --   --    GFR: Estimated Creatinine Clearance: 33.8 mL/min (by C-G formula based on SCr of 0.46 mg/dL). Liver Function Tests: Recent Labs  Lab 03/17/20 0921 03/19/20 0824 03/20/20 1239 03/21/20 0753  AST 46* 33 22 16  ALT 52* 65* 48* 29  ALKPHOS 115 117 110 73  BILITOT 0.8 1.4* 2.1* 1.7*  PROT 6.4* 6.7 6.8 5.1*  ALBUMIN 3.9 4.2 4.1 2.7*   No results for input(s): LIPASE, AMYLASE in the last 168 hours. No results for input(s): AMMONIA in the last 168 hours. Coagulation Profile: No results for input(s): INR, PROTIME in the last 168 hours. Cardiac Enzymes: Recent Labs  Lab 03/20/20 1239  CKTOTAL 45   BNP (last 3 results) No results for input(s): PROBNP in the last 8760 hours. HbA1C: Recent Labs    03/21/20 0753  HGBA1C 6.7*   CBG: Recent Labs  Lab 03/22/20 1123 03/22/20 1617 03/22/20 2126 03/23/20 0816 03/23/20 1214  GLUCAP 107* 162* 134* 113* 115*   Lipid Profile: No results for input(s): CHOL, HDL, LDLCALC, TRIG, CHOLHDL, LDLDIRECT in the last 72 hours. Thyroid Function Tests: No results for input(s): TSH, T4TOTAL, FREET4, T3FREE, THYROIDAB in the last 72 hours. Anemia Panel: No results for input(s): VITAMINB12, FOLATE, FERRITIN, TIBC, IRON, RETICCTPCT in the last 72 hours. Sepsis Labs: Recent Labs  Lab 03/20/20 1239  LATICACIDVEN 1.3    Recent Results (from the past 240 hour(s))  Culture, blood (routine x 2)     Status: Abnormal   Collection Time: 03/20/20  5:30 PM   Specimen: BLOOD  Result Value Ref Range Status   Specimen Description   Final    BLOOD PORTA CATH Performed at [facility], [address] [place], [place] [address]    Special Requests   Final  BOTTLES DRAWN AEROBIC AND ANAEROBIC Blood Culture adequate volume Performed at [facility], [address] [place], [place] [address]    Culture  Setup Time   Final    GRAM NEGATIVE RODS IN BOTH AEROBIC AND ANAEROBIC BOTTLES CRITICAL VALUE NOTED.  VALUE IS  CONSISTENT WITH PREVIOUSLY REPORTED AND CALLED VALUE. Performed at [facility], [place] [address], [place], [place] [address]    Culture (A)  Final    ESCHERICHIA COLI Confirmed Extended Spectrum Beta-Lactamase Producer (ESBL).  In bloodstream infections from ESBL organisms, carbapenems are preferred over piperacillin/tazobactam. They are shown to have a lower risk of mortality.    Report Status 03/23/2020 FINAL  Final   Organism ID, Bacteria ESCHERICHIA COLI  Final      Susceptibility   Escherichia coli - MIC*    AMPICILLIN >=32 RESISTANT Resistant     CEFAZOLIN >=64 RESISTANT Resistant     CEFEPIME 16 RESISTANT Resistant     CEFTAZIDIME RESISTANT Resistant     CEFTRIAXONE >=64 RESISTANT Resistant     CIPROFLOXACIN >=4 RESISTANT Resistant     GENTAMICIN <=1 SENSITIVE Sensitive     IMIPENEM <=0.25 SENSITIVE Sensitive     TRIMETH/SULFA <=20 SENSITIVE Sensitive     AMPICILLIN/SULBACTAM 16 INTERMEDIATE Intermediate     PIP/TAZO <=4 SENSITIVE Sensitive     * ESCHERICHIA COLI  Culture, blood (routine x 2)     Status: Abnormal   Collection Time: 03/20/20  5:30 PM   Specimen: BLOOD  Result Value Ref Range Status   Specimen Description   Final    BLOOD LEFT ANTECUBITAL Performed at [facility], [address], [place], [place] [address]    Special Requests   Final    BOTTLES DRAWN AEROBIC AND ANAEROBIC Blood Culture adequate volume Performed at [facility], [address] [place], [place] [address]    Culture  Setup Time   Final    Organism ID to follow GRAM NEGATIVE RODS IN BOTH AEROBIC AND ANAEROBIC BOTTLES CRITICAL RESULT CALLED TO, READ BACK BY AND VERIFIED WITH: NATHAN BLEWED 03/21/20 AT [place]. MF Performed at [facility], [address] [place], [place] [address]    Culture (A)  Final    ESCHERICHIA COLI SUSCEPTIBILITIES PERFORMED ON PREVIOUS CULTURE WITHIN THE LAST 5 DAYS. Performed at [facility], [place] [address],  [place], [place] [address]    Report Status 03/23/2020 FINAL  Final  SARS CORONAVIRUS 2 (TAT 6-24 HRS) Nasopharyngeal Nasopharyngeal Swab     Status: None   Collection Time: 03/20/20  5:30 PM   Specimen: Nasopharyngeal Swab  Result Value Ref Range Status   SARS Coronavirus 2 NEGATIVE NEGATIVE Final    Comment: (NOTE) SARS-CoV-2 target nucleic acids are NOT DETECTED.  The SARS-CoV-2 RNA is generally detectable in upper and lower respiratory specimens during the acute phase of infection. Negative results do not preclude SARS-CoV-2 infection, do not rule out co-infections with other pathogens, and should not be used as the sole basis for treatment or other patient management decisions. Negative results must be combined with clinical observations, patient history, and epidemiological information. The expected result is Negative.  Fact Sheet for Patients: SugarRoll.be  Fact Sheet for Healthcare Providers: https://www.woods-mathews.com/  This test is not yet approved or cleared by the [place] FDA and  has been authorized for detection and/or diagnosis of SARS-CoV-2 by FDA under an Emergency Use Authorization (EUA). This EUA will remain  in effect (meaning this test can be  used) for the duration of the COVID-19 declaration under Se ction 564(b)(1) of the Act, 21 U.S.C. section 360bbb-3(b)(1), unless the authorization is terminated or revoked sooner.  Performed at [facility], [place] [address], [place], [place] [address]   Blood Culture ID Panel (Reflexed)     Status: Abnormal   Collection Time: 03/20/20  5:30 PM  Result Value Ref Range Status   Enterococcus faecalis NOT DETECTED NOT DETECTED Final   Enterococcus Faecium NOT DETECTED NOT DETECTED Final   Listeria monocytogenes NOT DETECTED NOT DETECTED Final   Staphylococcus species NOT DETECTED NOT DETECTED Final   Staphylococcus aureus (BCID) NOT DETECTED NOT DETECTED Final    Staphylococcus epidermidis NOT DETECTED NOT DETECTED Final   Staphylococcus lugdunensis NOT DETECTED NOT DETECTED Final   Streptococcus species NOT DETECTED NOT DETECTED Final   Streptococcus agalactiae NOT DETECTED NOT DETECTED Final   Streptococcus pneumoniae NOT DETECTED NOT DETECTED Final   Streptococcus pyogenes NOT DETECTED NOT DETECTED Final   A.calcoaceticus-baumannii NOT DETECTED NOT DETECTED Final   Bacteroides fragilis NOT DETECTED NOT DETECTED Final   Enterobacterales DETECTED (A) NOT DETECTED Corrected    Comment:  NATHAN BELUE AT [id number] 03/21/20 MF/SDR Enterobacterales represent a large order of gram negative bacteria, not a single organism. CORRECTED ON 01/01 AT [id number]: PREVIOUSLY REPORTED AS DETECTED Enterobacterales represent a large order of gram negative bacteria, not a single organism. CRITICAL RESULT CALLED TO, READ BACK BY AND VERIFIED WITH:  NATHAN BELUE AT [id number] 03/22/20 MF/SDR    Enterobacter cloacae complex NOT DETECTED NOT DETECTED Final   Escherichia coli DETECTED (A) NOT DETECTED Corrected    Comment: CRITICAL RESULT CALLED TO, READ BACK BY AND VERIFIED WITH:  NATHAN BELUE AT [id number] 03/21/20 MF/SDR CORRECTED ON 01/01 AT [id number]: PREVIOUSLY REPORTED AS DETECTED CRITICAL RESULT CALLED TO, READ BACK BY AND VERIFIED WITH:  NATHAN BELUE AT [id number] 03/22/20 MF/SDR    Klebsiella aerogenes NOT DETECTED NOT DETECTED Final   Klebsiella oxytoca NOT DETECTED NOT DETECTED Final   Klebsiella pneumoniae NOT DETECTED NOT DETECTED Final   Proteus species NOT DETECTED NOT DETECTED Final   Salmonella species NOT DETECTED NOT DETECTED Final   Serratia marcescens NOT DETECTED NOT DETECTED Final   Haemophilus influenzae NOT DETECTED NOT DETECTED Final   Neisseria meningitidis NOT DETECTED NOT DETECTED Final   Pseudomonas aeruginosa NOT DETECTED NOT DETECTED Final   Stenotrophomonas maltophilia NOT DETECTED NOT DETECTED Final   Candida albicans NOT DETECTED NOT DETECTED Final   Candida auris NOT  DETECTED NOT DETECTED Final   Candida glabrata NOT DETECTED NOT DETECTED Final   Candida krusei NOT DETECTED NOT DETECTED Final   Candida parapsilosis NOT DETECTED NOT DETECTED Final   Candida tropicalis NOT DETECTED NOT DETECTED Final   Cryptococcus neoformans/gattii NOT DETECTED NOT DETECTED Final   CTX-M ESBL DETECTED (A) NOT DETECTED Corrected    Comment:  NATHAN BELUE AT [id number] 03/21/20 MF/SDR (NOTE) Extended spectrum beta-lactamase detected. Recommend a carbapenem as initial therapy.   CORRECTED ON 01/01 AT [id number]: PREVIOUSLY REPORTED AS DETECTED CRITICAL RESULT CALLED TO, READ BACK BY AND VERIFIED WITH:  NATHAN BELUE AT [id number] 03/22/20 MF/SDR    Carbapenem resistance IMP NOT DETECTED NOT DETECTED Final   Carbapenem resistance KPC NOT DETECTED NOT DETECTED Final   Carbapenem resistance NDM NOT DETECTED NOT DETECTED Final   Carbapenem resist OXA 48 LIKE NOT DETECTED NOT DETECTED Final   Carbapenem resistance VIM NOT DETECTED NOT DETECTED Final    Comment: Performed at [facility],  [place], [place] [address]  Culture, blood (Routine X 2) w Reflex to ID Panel     Status: None (Preliminary result)   Collection Time: 03/22/20  4:39 AM   Specimen: BLOOD  Result Value Ref Range Status   Specimen Description BLOOD RIGHT WRIST  Final   Special Requests   Final    BOTTLES DRAWN AEROBIC AND ANAEROBIC Blood Culture adequate volume   Culture   Final    NO GROWTH 1 DAY Performed at [facility], [address], [place], [place] [address]    Report Status PENDING  Incomplete  Culture, blood (Routine X 2) w Reflex to ID Panel     Status: None (Preliminary result)   Collection Time: 03/22/20  4:41 AM   Specimen: BLOOD  Result Value Ref Range Status   Specimen Description BLOOD RIGHT [facility]  Final   Special Requests   Final    BOTTLES DRAWN AEROBIC AND ANAEROBIC Blood Culture adequate volume   Culture   Final    NO GROWTH 1 DAY Performed at [facility],  [address] [place], [place] [address]    Report Status PENDING  Incomplete      Imaging Studies   No results found.   Medications   Scheduled Meds: . sodium chloride   Intravenous Once  . allopurinol  300 mg Oral Daily  . Chlorhexidine Gluconate Cloth  6 each Topical Daily  . hydrALAZINE  50 mg Oral BID  . hydroxyurea  1,000 mg Oral Q breakfast  . insulin aspart  0-5 Units Subcutaneous QHS  . insulin aspart  0-9 Units Subcutaneous TID WC  . methocarbamol  500 mg Oral TID  . mirtazapine  7.5 mg Oral QHS  . sodium chloride flush  3 mL Intravenous Q12H   Continuous Infusions: . meropenem (MERREM) IV 1 g (03/23/20 0526)  . vancomycin 500 mg (03/22/20 1843)       LOS: 3 days    Time spent: 30 minutes with > 50% spent at bedside and in coordination of care.    Ezekiel Slocumb, DO [facility]  03/23/2020, 3:36 PM    If 7PM-7AM, please contact night-coverage. How to contact the [facility] Attending or Consulting provider [place] or covering provider during after hours [place], for this patient?    1. Check the care team in [facility] and look for a) attending/consulting [facility] provider listed and b) the [facility] team listed 2. Log into [email or web address] and use [place]'s universal password to access. If you do not have the password, please contact the hospital operator. 3. Locate the [facility] provider you are looking for under [facility] and page to a number that you can be directly reached. 4. If you still have difficulty reaching the provider, please page the [facility] (Director on Call) for the Hospitalists listed on amion for assistance.

## 2020-03-23 NOTE — Progress Notes (Signed)
Pharmacy Antibiotic Note  Jacqueline Russo is a 87 y.o. female admitted on 03/20/2020. Pharmacy has been consulted for vancomycin dosing. Patient also on meropenem for ESBL bacteremia.  There is concern that patient may have contaminated her hand wound with feces and led to ESBL E. Coli bacteremia.  On the other hand,  Her skin/soft tissue is behaving more like a GPC infection over GNR so plan per ID is to continue vancomycin for now.    Today, 03/23/2020  Day #3 vancomycin/meropenem  SCr below baseline this am  WBC improving but difficult to interpret in setting of CLL/Mantle cell  Afebrile  Check vancomycin trough at 17:30 today,  Prior to 4th overall dose (3rd maintenance dose).    Plan:  Continue vancomycin 500 mg IV q24h at 1800, goal trough 10-15 mcg/mL  Will check vancomycin trough tonight to evaluate appropriateness of dosing as suspect Cockgroft-Gault is underestimating renal function   Continue to follow renal function closely.  Continue meropenem 1gm IV q12 for now  Height: 5' (152.4 cm) Weight: 44 kg (97 lb) IBW/kg (Calculated) : 45.5  Temp (24hrs), Avg:98.6 F (37 C), Min:97.8 F (36.6 C), Max:99.1 F (37.3 C)  Recent Labs  Lab 03/19/20 0824 03/20/20 1239 03/21/20 0753 03/22/20 0439 03/23/20 0529  WBC 108.3* 89.2* 66.1* 69.2* 46.8*  CREATININE 0.65 0.64 0.63 0.85 0.46  LATICACIDVEN  --  1.3  --   --   --     Estimated Creatinine Clearance: 33.8 mL/min (by C-G formula based on SCr of 0.46 mg/dL).    Allergies  Allergen Reactions  . Ciprofloxacin Nausea Only    Antimicrobials this admission: Vancomycin 12/31 >> Meropenem 12/31 >>   Microbiology results: 12/31 BCx: 4 of 4 with ESBL E. Coli.   CTX-M ESBL Detected    Thank you for allowing pharmacy to be a part of this patient's care.  Juliette Alcide, PharmD, BCPS.   Work Cell: [phone] 03/23/2020 3:47 PM

## 2020-03-23 NOTE — Progress Notes (Signed)
*  PRELIMINARY RESULTS* Echocardiogram 2D Echocardiogram has been performed.  Jacqueline Russo Jacqueline Russo 03/23/2020, 8:53 PM

## 2020-03-23 NOTE — Evaluation (Signed)
Occupational Therapy Evaluation Patient Details Name: Jacqueline Russo MRN: [id number] DOB: [birth year] Today's Date: 03/23/2020    History of Present Illness Pt is an 87 y/o F admitted on 03/20/20 after presenting to ED with c/o RUE pain & swelling likely 2/2 cellulitis. PMH: CLL with chemo, peripheral neuropathy, HLD, gout, DM2, CKD stage 3, vitamin B12 deficiency, MDC, carotid stenosis, malnutrition, R total shoulder arthroplasty   Clinical Impression   Ms Christoffel was seen for OT evaluation this date. Prior to hospital admission, pt was Independent for mobility and ADLs. Pt lives in [place] on 4th floor apartment (has elevator access, prefers stairs). Pt has baseline macular degeneration. Pt presents to acute OT demonstrating impaired ADL performance and functional mobility 2/2 decreased activity tolerance, functional impaired use of dominant RUE, and pain. Pt currently requires SUPERVISION doff B socks seated EOB using LUE only. MOD A don L sock, SUP don R sock seated EOB. MIN A + L HHA for ADL t/f. Significantyl limited by RUE edema/pain.    Pt would benefit from skilled OT to address noted impairments and functional limitations (see below for any additional details) in order to maximize safety and independence while minimizing falls risk and caregiver burden. Upon hospital discharge, recommend HHOT to maximize pt safety and return to functional independence during meaningful occupations of daily life.     Follow Up Recommendations  Home health OT (SUPERVISION for Mobility)    Equipment Recommendations  None recommended by OT    Recommendations for Other Services       Precautions / Restrictions Precautions Precautions: Fall Restrictions Weight Bearing Restrictions: No      Mobility Bed Mobility Overal bed mobility: Needs Assistance Bed Mobility: Supine to Sit;Sit to Supine     Supine to sit: Min assist Sit to supine: Min guard   General bed mobility comments:  extra time & assist to upright trunk    Transfers Overall transfer level: Needs assistance Equipment used: 1 person hand held assist Transfers: Sit to/from Stand Sit to Stand: Min assist Stand pivot transfers: Min guard            Balance Overall balance assessment: Needs assistance Sitting-balance support: Feet supported Sitting balance-Leahy Scale: Good     Standing balance support: Single extremity supported Standing balance-Leahy Scale: Fair                             ADL either performed or assessed with clinical judgement   ADL Overall ADL's : Needs assistance/impaired                                       General ADL Comments: SUPERVISION doff B socks seated EOB using LUE only. MOD A don L sock, SUP don R sock seated EOB. MIN A + L HHA for ADL t/f.     Vision Baseline Vision/History: Macular Degeneration              Pertinent Vitals/Pain Pain Assessment: 0-10 Pain Score: 8  Faces Pain Scale: Hurts even more Pain Location: RUE with movement Pain Descriptors / Indicators: Discomfort;Grimacing Pain Intervention(s): Limited activity within patient's tolerance;Repositioned     Hand Dominance     Extremity/Trunk Assessment Upper Extremity Assessment Upper Extremity Assessment: RUE deficits/detail RUE Deficits / Details: RUE edema/pain limiting ROM. AROM shoulder flexion ~45*, elbow flexion ~20-45*, unable to achieve  supination, pronation-neutral intact. Unable to achieve fully closed grip   Lower Extremity Assessment Lower Extremity Assessment: Generalized weakness       Communication Communication Communication: No difficulties   Cognition Arousal/Alertness: Awake/alert Behavior During Therapy: WFL for tasks assessed/performed Overall Cognitive Status: Within Functional Limits for tasks assessed                                 General Comments: very pleasant, sweet lady   General Comments  RUE  edema    Exercises Exercises: Other exercises Other Exercises Other Exercises: Pt educated re: OT role, DME recs, d/c recs, falls prevention, ECS, edema mgmt Other Exercises: LBD, sup<>sit, sit<>stand, sitting.standing balance/tolerance   Shoulder Instructions      Home Living Family/patient expects to be discharged to:: Assisted living (independent living, level apartment but on 4th floor)                             Home Equipment: Bedside commode;Shower seat;Grab bars - tub/shower   Additional Comments: Reports HHOT x2/week. Uses BSC next to bed. Chair in shower      Prior Functioning/Environment Level of Independence: Independent        Comments: Pt reports she preferred negotiating 4 flights to her apartment vs using elevator PTA. Independent without AD.  Cooks meals for herself.        OT Problem List: Decreased strength;Decreased range of motion;Decreased activity tolerance;Impaired balance (sitting and/or standing);Impaired UE functional use      OT Treatment/Interventions: Self-care/ADL training;Therapeutic exercise;Energy conservation;DME and/or AE instruction;Therapeutic activities;Patient/family education;Balance training    OT Goals(Current goals can be found in the care plan section) Acute Rehab OT Goals Patient Stated Goal: to get better OT Goal Formulation: With patient Time For Goal Achievement: 04/06/20 Potential to Achieve Goals: Good  OT Frequency: Min 1X/week    AM-PAC OT "6 Clicks" Daily Activity     Outcome Measure Help from another person eating meals?: None Help from another person taking care of personal grooming?: A Little Help from another person toileting, which includes using toliet, bedpan, or urinal?: A Little Help from another person bathing (including washing, rinsing, drying)?: A Little Help from another person to put on and taking off regular upper body clothing?: A Little Help from another person to put on and taking off  regular lower body clothing?: A Lot 6 Click Score: 18   End of Session    Activity Tolerance: Patient tolerated treatment well Patient left: in bed;with call bell/phone within reach  OT Visit Diagnosis: Other abnormalities of gait and mobility (R26.89)                Time: 8676-7209 OT Time Calculation (min): 28 min Charges:  OT General Charges $OT Visit: 1 Visit OT Evaluation $OT Eval Moderate Complexity: 1 Mod OT Treatments $Self Care/Home Management : 8-22 mins $Therapeutic Activity: 8-22 mins  Kathie Dike, M.S. OTR/L  03/23/20, 5:16 PM  ascom [phone]

## 2020-03-23 NOTE — Progress Notes (Signed)
Pharmacy Antibiotic Note  Jacqueline Russo is a 87 y.o. female admitted on 03/20/2020. Pharmacy has been consulted for vancomycin dosing. Patient also on meropenem for ESBL bacteremia.  There is concern that patient may have contaminated her hand wound with feces and led to ESBL E. Coli bacteremia.  On the other hand,  Her skin/soft tissue is behaving more like a GPC infection over GNR so plan per ID is to continue vancomycin for now.    01/03 1730 Trough = 7 ug/mL, (Goal 10-15)  Today, 03/23/2020  Day #3 vancomycin/meropenem  SCr below baseline this am  WBC improving but difficult to interpret in setting of CLL/Mantle cell  Afebrile  Plan:  Increase vancomycin to 1000 mg IV q24h starting at 1900, goal trough 10-15 mcg/mL  Continue to follow renal function closely.  Continue meropenem 1gm IV q12 for now  Height: 5' (152.4 cm) Weight: 44 kg (97 lb) IBW/kg (Calculated) : 45.5  Temp (24hrs), Avg:98.7 F (37.1 C), Min:97.8 F (36.6 C), Max:99.8 F (37.7 C)  Recent Labs  Lab 03/19/20 0824 03/20/20 1239 03/21/20 0753 03/22/20 0439 03/23/20 0529 03/23/20 1739  WBC 108.3* 89.2* 66.1* 69.2* 46.8*  --   CREATININE 0.65 0.64 0.63 0.85 0.46  --   LATICACIDVEN  --  1.3  --   --   --   --   VANCOTROUGH  --   --   --   --   --  7*    Estimated Creatinine Clearance: 33.8 mL/min (by C-G formula based on SCr of 0.46 mg/dL).    Allergies  Allergen Reactions  . Ciprofloxacin Nausea Only    Antimicrobials this admission: Vancomycin 12/31 >> Meropenem 12/31 >>  Dose adjustments this admission: 01/03 Vanco 500 mg Q24H >> 1000 mg Q24H  Microbiology results: 12/31 BCx: 4 of 4 with ESBL E. Coli.   CTX-M ESBL Detected    Thank you for allowing pharmacy to be a part of this patient's care.  Sharen Hones, PharmD, BCPS Clinical Pharmacist  03/23/2020 6:09 PM

## 2020-03-23 NOTE — Consult Note (Signed)
NAMEKadisha Russo  DOB: 1930-01-21  MRN: [id number]  Date/Time: 03/23/2020 10:35 AM  REQUESTING PROVIDER: Dr.Griffith Subjective:  REASON FOR CONSULT: rt arm infection ? Jacqueline Russo is a 87 y.o. 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 She went to cancer center that day and started chemo. That night she had severe diarrhea and contamianted the hand with her stool and some stool got inside the torn skin On 12/30 /21 she went back to cancer center with severe pain and swelling of the rt hand and arm   Vitals in the ED 209/86, temp 100 HR 116 RR 16 WBC 89.2, Hb 10.4, cr 0.64 Blood culture sent. Started on meropenem and vanco Seen by surgery. CT hand /foream showed edema with soft tissue stranding. I am seeing the patient as the blood culture came back as ESBL e.coli   Pt has CLL, MDS transfusion dependent at baseline and followed by oncologist, She recently had fatigue and  a severe spike in WBC and underwent bone marrow biopsy on 02/26/20 and it showed   marrow  essentially replaced by an abnormal population of  lymphocytes. By flow cytometry the lymphocytes were positive for CD5. Immunohistochemistry was  positive for CyclinD1. These findings were consistent with involvement by mantle cell lymphoma. There were no  increase in blasts. Started chemo 03/16/20 with bendamustine, rituximab    Past Medical History:  Diagnosis Date  . Anemia    Myelodysplastic syndrome  . Arthritis   . Hypertension   . Leukemia, chronic lymphoid (HCC)    CLL  . Ovarian cancer ([place]) 1963    Past Surgical History:  Procedure Laterality Date  . ABDOMINAL HYSTERECTOMY    . APPENDECTOMY    . BREAST SURGERY      cyst removed from right breast  . EYE SURGERY Bilateral    Cataract Extractions  . IR FLUORO GUIDE CV LINE RIGHT  06/30/2017  . REVERSE SHOULDER ARTHROPLASTY Right 10/28/2014   Procedure: REVERSE SHOULDER ARTHROPLASTY;  Surgeon: Corky Mull, MD;  Location: [facility] ORS;  Service: Orthopedics;  Laterality: Right;    Social History   Socioeconomic History  . Marital status: Widowed    Spouse name: Not on file  . Number of children: Not on file  . Years of education: Not on file  . Highest education level: Not on file  Occupational History  . Not on file  Tobacco Use  . Smoking status: Former Smoker    Packs/day: 2.00    Types: Cigarettes    Quit date: 1963    Years since quitting: 59.0  . Smokeless tobacco: Never Used  Vaping Use  . Vaping Use: Never used  Substance and Sexual Activity  . Alcohol use: Not Currently    Alcohol/week: 1.0 standard drink    Types: 1 Glasses of wine per week    Comment: 1 glass of wine daily  . Drug use: No  . Sexual activity: Not on file  Other Topics Concern  . Not on file  Social History Narrative  . Not on file   Social Determinants of Health   Financial Resource Strain: Not on file  Food Insecurity: Not  on file  Transportation Needs: Not on file  Physical Activity: Not on file  Stress: Not on file  Social Connections: Not on file  Intimate Partner Violence: Not on file    History reviewed. No pertinent family history. Allergies  Allergen Reactions  . Ciprofloxacin Nausea Only    ? Current Facility-Administered Medications  Medication Dose Route Frequency Provider Last Rate Last Admin  . 0.9 %  sodium chloride infusion (Manually program via Guardrails IV Fluids)   Intravenous Once Nicole Kindred A, DO      . acetaminophen (TYLENOL) tablet 650 mg  650 mg Oral Q6H PRN Lenna Sciara, MD       Or  . acetaminophen (TYLENOL) suppository 650 mg  650 mg Rectal Q6H PRN Lenna Sciara, MD      . allopurinol (ZYLOPRIM) tablet 300 mg  300  mg Oral Daily Lenna Sciara, MD   300 mg at 03/23/20 1004  . ALPRAZolam Duanne Moron) tablet 0.25 mg  0.25 mg Oral Daily PRN Lenna Sciara, MD      . Chlorhexidine Gluconate Cloth 2 % PADS 6 each  6 each Topical Daily Enzo Bi, MD   6 each at 03/23/20 1008  . diphenhydrAMINE (BENADRYL) capsule 25 mg  25 mg Oral QHS PRN Renda Rolls, RPH      . hydrALAZINE (APRESOLINE) tablet 50 mg  50 mg Oral BID Lenna Sciara, MD   50 mg at 03/23/20 1004  . hydroxyurea (HYDREA) capsule 1,000 mg  1,000 mg Oral Q breakfast Lenna Sciara, MD   1,000 mg at 03/23/20 1004  . insulin aspart (novoLOG) injection 0-5 Units  0-5 Units Subcutaneous QHS Lenna Sciara, MD      . insulin aspart (novoLOG) injection 0-9 Units  0-9 Units Subcutaneous TID WC Lenna Sciara, MD   2 Units at 03/22/20 1648  . meropenem (MERREM) 1 g in sodium chloride 0.9 % 100 mL IVPB  1 g Intravenous Q12H Enzo Bi, MD 200 mL/hr at 03/23/20 0526 1 g at 03/23/20 0526  . mirtazapine (REMERON) tablet 7.5 mg  7.5 mg Oral QHS Lenna Sciara, MD   7.5 mg at 03/22/20 2228  . ondansetron (ZOFRAN) tablet 8 mg  8 mg Oral BID PRN Lenna Sciara, MD      . oxyCODONE (Oxy IR/ROXICODONE) immediate release tablet 5 mg  5 mg Oral Q4H PRN Lenna Sciara, MD   5 mg at 03/23/20 0650  . sodium chloride flush (NS) 0.9 % injection 3 mL  3 mL Intravenous Q12H Lenna Sciara, MD   3 mL at 03/23/20 1008  . vancomycin (VANCOREADY) IVPB 500 mg/100 mL  500 mg Intravenous Q24H Lenna Sciara, MD 100 mL/hr at 03/22/20 1843 500 mg at 03/22/20 1843   Facility-Administered Medications Ordered in Other Encounters  Medication Dose Route Frequency Provider Last Rate Last Admin  . heparin lock flush 100 unit/mL  500 Units Intravenous Once Lloyd Huger, MD      . sodium chloride flush (NS) 0.9 % injection 10 mL  10 mL Intravenous PRN Lloyd Huger, MD   10 mL at 09/05/18 3295     Abtx:  Anti-infectives (From admission, onward)   Start     Dose/Rate Route Frequency Ordered Stop    03/21/20 1800  vancomycin (VANCOREADY) IVPB 500 mg/100 mL        500 mg 100 mL/hr over 60 Minutes Intravenous Every 24 hours 03/20/20 1945     03/21/20 1800  meropenem (MERREM) 1 g in sodium chloride 0.9 %  100 mL IVPB        1 g 200 mL/hr over 30 Minutes Intravenous Every 12 hours 03/21/20 1302     03/20/20 1545  vancomycin (VANCOCIN) IVPB 1000 mg/200 mL premix        1,000 mg 200 mL/hr over 60 Minutes Intravenous  Once 03/20/20 1537 03/20/20 2203   03/20/20 1545  meropenem (MERREM) 1 g in sodium chloride 0.9 % 100 mL IVPB  Status:  Discontinued        1 g 200 mL/hr over 30 Minutes Intravenous Every 8 hours 03/20/20 1537 03/21/20 1302      REVIEW OF SYSTEMS:  Const: negative fever, negative chills, negative weight loss Eyes: negative diplopia or visual changes, negative eye pain ENT: negative coryza, negative sore throat Resp: negative cough, hemoptysis, dyspnea Cards: negative for chest pain, palpitations, lower extremity edema GU: negative for frequency, dysuria and hematuria GI: Negative for abdominal pain, ++++diarrhea, no bleeding, constipation Skin: easy tears Heme: easy bruising  MS: fatigue and  muscle weakness Neurolo: some confusion Psych: negative for feelings of anxiety, depression  Endocrine: negative for thyroid, diabetes Allergy/Immunology- as above Objective:  VITALS:  BP (!) 134/53 (BP Location: Left Arm)   Pulse 96   Temp 97.8 F (36.6 C) (Oral)   Resp 17   Ht 5' (1.524 m)   Wt 44 kg   SpO2 100%   BMI 18.94 kg/m  PHYSICAL EXAM:  General: Alert, cooperative, some distress when moving hand. Oriented x5 Head: Normocephalic, without obvious abnormality, atraumatic. Eyes: Conjunctivae clear, anicteric sclerae. Pupils are equal ENT Nares normal. No drainage or sinus tenderness. Lips, mucosa, and tongue normal. No Thrush Neck: Supple, symmetrical, no adenopathy, thyroid: non tender no carotid bruit and no JVD. Port site fine Lungs: b/la ir  entry. Heart:s1s2 Abdomen: Soft, non-tender,not distended-lap scar . Bowel sounds normal. No masses Extremities: rt arm swollen, erythematous and tender to touch with restricted movt The swelling is more around the cubital fossa- above it   There is a tear on the skin over the dorsum of the rt hand Skin: No rashes or lesions. Or bruising Lymph: Cervical, supraclavicular normal. Neurologic: Grossly non-focal Pertinent Labs Lab Results CBC    Component Value Date/Time   WBC 46.8 (H) 03/23/2020 0529   RBC 2.75 (L) 03/23/2020 0529   HGB 8.2 (L) 03/23/2020 0529   HGB 11.6 (L) 01/06/2014 0828   HCT 24.8 (L) 03/23/2020 0529   HCT 32.7 (L) 01/06/2014 0828   PLT 23 (LL) 03/23/2020 0529   PLT 176 01/06/2014 0828   MCV 90.2 03/23/2020 0529   MCV 103 (H) 01/06/2014 0828   MCH 29.8 03/23/2020 0529   MCHC 33.1 03/23/2020 0529   RDW 17.5 (H) 03/23/2020 0529   RDW 12.7 01/06/2014 0828   LYMPHSABS 83.7 (H) 03/20/2020 1239   LYMPHSABS 1.4 01/06/2014 0828   MONOABS 0.9 03/20/2020 1239   MONOABS 0.3 01/06/2014 0828   EOSABS 0.4 03/20/2020 1239   EOSABS 0.1 01/06/2014 0828   BASOSABS 0.3 (H) 03/20/2020 1239   BASOSABS 0.1 01/06/2014 0828    CMP Latest Ref Rng & Units 03/23/2020 03/22/2020 03/21/2020  Glucose 70 - 99 mg/dL 132(H) 110(H) 130(H)  BUN 8 - 23 mg/dL _0 Creatinine 0.44 - 1.00 mg/dL 0.46 0.85 0.63  Sodium 135 - 145 mmol/L 132(L) 133(L) 132(L)  Potassium 3.5 - 5.1 mmol/L 4.0 2.8(L) 3.1(L)  Chloride 98 - 111 mmol/L 103 99 100  CO2 22 - 32 mmol/L 21(L) 19(L) 21(L)  Calcium 8.9 - 10.3 mg/dL 7.1(L) 7.2(L) 7.2(L)  Total Protein 6.5 - 8.1 g/dL - - 5.1(L)  Total Bilirubin 0.3 - 1.2 mg/dL - - 1.7(H)  Alkaline Phos 38 - 126 U/L - - 73  AST 15 - 41 U/L - - 16  ALT 0 - 44 U/L - - 29      Microbiology: Recent Results (from the past 240 hour(s))  Culture, blood (routine x 2)     Status: Abnormal   Collection Time: 03/20/20  5:30 PM   Specimen: BLOOD  Result Value Ref Range  Status   Specimen Description   Final    BLOOD PORTA CATH Performed at [facility], [address], [place], [place] [address]    Special Requests   Final    BOTTLES DRAWN AEROBIC AND ANAEROBIC Blood Culture adequate volume Performed at [facility], [address] [place], [place] [address]    Culture  Setup Time   Final    GRAM NEGATIVE RODS IN BOTH AEROBIC AND ANAEROBIC BOTTLES CRITICAL VALUE NOTED.  VALUE IS CONSISTENT WITH PREVIOUSLY REPORTED AND CALLED VALUE. Performed at [facility], [place] [address] [place], [place] [address]    Culture (A)  Final    ESCHERICHIA COLI Confirmed Extended Spectrum Beta-Lactamase Producer (ESBL).  In bloodstream infections from ESBL organisms, carbapenems are preferred over piperacillin/tazobactam. They are shown to have a lower risk of mortality.    Report Status 03/23/2020 FINAL  Final   Organism ID, Bacteria ESCHERICHIA COLI  Final      Susceptibility   Escherichia coli - MIC*    AMPICILLIN >=32 RESISTANT Resistant     CEFAZOLIN >=64 RESISTANT Resistant     CEFEPIME 16 RESISTANT Resistant     CEFTAZIDIME RESISTANT Resistant     CEFTRIAXONE >=64 RESISTANT Resistant     CIPROFLOXACIN >=4 RESISTANT Resistant     GENTAMICIN <=1 SENSITIVE Sensitive     IMIPENEM <=0.25 SENSITIVE Sensitive     TRIMETH/SULFA <=20 SENSITIVE Sensitive     AMPICILLIN/SULBACTAM 16 INTERMEDIATE Intermediate     PIP/TAZO <=4 SENSITIVE Sensitive     * ESCHERICHIA COLI  Culture, blood (routine x 2)     Status: Abnormal   Collection Time: 03/20/20  5:30 PM   Specimen: BLOOD  Result Value Ref Range Status   Specimen Description   Final    BLOOD LEFT ANTECUBITAL Performed at [facility], [address] [place], [place] [address]    Special Requests   Final    BOTTLES DRAWN AEROBIC AND ANAEROBIC Blood Culture adequate volume Performed at [facility], [address] [place], [place] [address]    Culture  Setup Time    Final    Organism ID to follow GRAM NEGATIVE RODS IN BOTH AEROBIC AND ANAEROBIC BOTTLES CRITICAL RESULT CALLED TO, READ BACK BY AND VERIFIED WITH: NATHAN BLEWED 03/21/20 AT [place]. MF Performed at [facility], [address] [place], [place] [address]    Culture (A)  Final    ESCHERICHIA COLI SUSCEPTIBILITIES PERFORMED ON PREVIOUS CULTURE WITHIN THE LAST 5 DAYS. Performed at [facility], [place] [address] [place], [place] [address]    Report Status 03/23/2020 FINAL  Final  SARS CORONAVIRUS 2 (TAT 6-24 HRS) Nasopharyngeal Nasopharyngeal Swab     Status: None   Collection Time: 03/20/20  5:30 PM   Specimen: Nasopharyngeal Swab  Result Value Ref Range Status   SARS Coronavirus 2 NEGATIVE NEGATIVE Final  Comment: (NOTE) SARS-CoV-2 target nucleic acids are NOT DETECTED.  The SARS-CoV-2 RNA is generally detectable in upper and lower respiratory specimens during the acute phase of infection. Negative results do not preclude SARS-CoV-2 infection, do not rule out co-infections with other pathogens, and should not be used as the sole basis for treatment or other patient management decisions. Negative results must be combined with clinical observations, patient history, and epidemiological information. The expected result is Negative.  Fact Sheet for Patients: SugarRoll.be  Fact Sheet for Healthcare Providers: https://www.woods-mathews.com/  This test is not yet approved or cleared by the [place] FDA and  has been authorized for detection and/or diagnosis of SARS-CoV-2 by FDA under an Emergency Use Authorization (EUA). This EUA will remain  in effect (meaning this test can be used) for the duration of the COVID-19 declaration under Se ction 564(b)(1) of the Act, 21 U.S.C. section 360bbb-3(b)(1), unless the authorization is terminated or revoked sooner.  Performed at [facility], [place] [address] [place],  [place] [address]   Blood Culture ID Panel (Reflexed)     Status: Abnormal   Collection Time: 03/20/20  5:30 PM  Result Value Ref Range Status   Enterococcus faecalis NOT DETECTED NOT DETECTED Final   Enterococcus Faecium NOT DETECTED NOT DETECTED Final   Listeria monocytogenes NOT DETECTED NOT DETECTED Final   Staphylococcus species NOT DETECTED NOT DETECTED Final   Staphylococcus aureus (BCID) NOT DETECTED NOT DETECTED Final   Staphylococcus epidermidis NOT DETECTED NOT DETECTED Final   Staphylococcus lugdunensis NOT DETECTED NOT DETECTED Final   Streptococcus species NOT DETECTED NOT DETECTED Final   Streptococcus agalactiae NOT DETECTED NOT DETECTED Final   Streptococcus pneumoniae NOT DETECTED NOT DETECTED Final   Streptococcus pyogenes NOT DETECTED NOT DETECTED Final   A.calcoaceticus-baumannii NOT DETECTED NOT DETECTED Final   Bacteroides fragilis NOT DETECTED NOT DETECTED Final   Enterobacterales DETECTED (A) NOT DETECTED Corrected    Comment:  NATHAN BELUE AT 3220 03/21/20 MF/SDR Enterobacterales represent a large order of gram negative bacteria, not a single organism. CORRECTED ON 01/01 AT 2542: PREVIOUSLY REPORTED AS DETECTED Enterobacterales represent a large order of gram negative bacteria, not a single organism. CRITICAL RESULT CALLED TO, READ BACK BY AND VERIFIED WITH:  NATHAN BELUE AT 7062 03/22/20 MF/SDR    Enterobacter cloacae complex NOT DETECTED NOT DETECTED Final   Escherichia coli DETECTED (A) NOT DETECTED Corrected    Comment: CRITICAL RESULT CALLED TO, READ BACK BY AND VERIFIED WITH:  NATHAN BELUE AT 3762 03/21/20 MF/SDR CORRECTED ON 01/01 AT 8315: PREVIOUSLY REPORTED AS DETECTED CRITICAL RESULT CALLED TO, READ BACK BY AND VERIFIED WITH:  NATHAN BELUE AT 1761 03/22/20 MF/SDR    Klebsiella aerogenes NOT DETECTED NOT DETECTED Final   Klebsiella oxytoca NOT DETECTED NOT DETECTED Final   Klebsiella pneumoniae NOT DETECTED NOT DETECTED Final   Proteus species NOT DETECTED NOT  DETECTED Final   Salmonella species NOT DETECTED NOT DETECTED Final   Serratia marcescens NOT DETECTED NOT DETECTED Final   Haemophilus influenzae NOT DETECTED NOT DETECTED Final   Neisseria meningitidis NOT DETECTED NOT DETECTED Final   Pseudomonas aeruginosa NOT DETECTED NOT DETECTED Final   Stenotrophomonas maltophilia NOT DETECTED NOT DETECTED Final   Candida albicans NOT DETECTED NOT DETECTED Final   Candida auris NOT DETECTED NOT DETECTED Final   Candida glabrata NOT DETECTED NOT DETECTED Final   Candida krusei NOT DETECTED NOT DETECTED Final   Candida parapsilosis NOT DETECTED NOT DETECTED Final   Candida tropicalis  NOT DETECTED NOT DETECTED Final   Cryptococcus neoformans/gattii NOT DETECTED NOT DETECTED Final   CTX-M ESBL DETECTED (A) NOT DETECTED Corrected    Comment:  NATHAN BELUE AT 8882 03/21/20 MF/SDR (NOTE) Extended spectrum beta-lactamase detected. Recommend a carbapenem as initial therapy.   CORRECTED ON 01/01 AT 8003: PREVIOUSLY REPORTED AS DETECTED CRITICAL RESULT CALLED TO, READ BACK BY AND VERIFIED WITH:  NATHAN BELUE AT 4917 03/22/20 MF/SDR    Carbapenem resistance IMP NOT DETECTED NOT DETECTED Final   Carbapenem resistance KPC NOT DETECTED NOT DETECTED Final   Carbapenem resistance NDM NOT DETECTED NOT DETECTED Final   Carbapenem resist OXA 48 LIKE NOT DETECTED NOT DETECTED Final   Carbapenem resistance VIM NOT DETECTED NOT DETECTED Final    Comment: Performed at [facility], [place] [place], [place] [address]  Culture, blood (Routine X 2) w Reflex to ID Panel     Status: None (Preliminary result)   Collection Time: 03/22/20  4:39 AM   Specimen: BLOOD  Result Value Ref Range Status   Specimen Description BLOOD RIGHT WRIST  Final   Special Requests   Final    BOTTLES DRAWN AEROBIC AND ANAEROBIC Blood Culture adequate volume   Culture   Final    NO GROWTH 1 DAY Performed at [facility], [address], [place], [place] [address]     Report Status PENDING  Incomplete  Culture, blood (Routine X 2) w Reflex to ID Panel     Status: None (Preliminary result)   Collection Time: 03/22/20  4:41 AM   Specimen: BLOOD  Result Value Ref Range Status   Specimen Description BLOOD RIGHT [facility]  Final   Special Requests   Final    BOTTLES DRAWN AEROBIC AND ANAEROBIC Blood Culture adequate volume   Culture   Final    NO GROWTH 1 DAY Performed at [facility], [address] [place], [place] [address]    Report Status PENDING  Incomplete    IMAGING RESULTS: CT hand,  No focal fluid collection within the soft tissue to suggest abscess.Mild subcutaneous soft tissue stranding suggest cellulitis or edema.No acute bony abnormality. Degenerative changes in the wrist and IP joint. I have personally reviewed the films ? Impression/Recommendation ? 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 improving  Gout-on allopurinol  DM- on sliding scale    ___________________________________________________ Discussed with patient, requesting provider Note:  This document was prepared using Dragon voice recognition software and may include unintentional dictation errors.

## 2020-03-23 NOTE — Evaluation (Signed)
Physical Therapy Evaluation Patient Details Name: Jacqueline Russo MRN: [id number] DOB: [birth year] Today's Date: 03/23/2020   History of Present Illness  Pt is an 87 y/o F admitted on 03/20/20 after presenting to ED with c/o RUE pain & swelling likely 2/2 cellulitis. PMH: CLL with chemo, peripheral neuropathy, HLD, gout, DM2, CKD stage 3, vitamin B12 deficiency, MDC, carotid stenosis, malnutrition, R total shoulder arthroplasty  Clinical Impression  Pt with significantly low platelet count but per secure chat, MD clears pt for participation reporting this is due to chemo & is chronic.   Pt seen for PT evaluation on this date, requiring min assist for bed mobility, CGA for transfers & min assist to take 3 side steps EOB. Pt declines further gait or sitting in recliner on this date but completes transfer to [facility] & has continent void, performing peri hygiene without assistance (extra time to begin urinating). Pt demonstrates increased pain in RUE which causes decreased functional use. Pt would benefit from continued skilled PT treatment to progress gait with LRAD (try RW if pt willing & can hold with RUE) for balance, endurance training & stair negotiation.     Follow Up Recommendations Supervision for mobility/OOB;Home health PT    Equipment Recommendations  None recommended by PT (pt already has a RW)    Recommendations for Other Services       Precautions / Restrictions Precautions Precautions: Fall Restrictions Weight Bearing Restrictions: No      Mobility  Bed Mobility Overal bed mobility: Needs Assistance Bed Mobility: Supine to Sit;Sit to Supine     Supine to sit: Min assist Sit to supine: Supervision   General bed mobility comments: extra time & assist to upright trunk    Transfers Overall transfer level: Needs assistance Equipment used: 1 person hand held assist Transfers: Sit to/from Stand;Stand Pivot Transfers Sit to Stand: Min guard Stand pivot transfers: Min guard           Ambulation/Gait Ambulation/Gait assistance:  (pt takes 3 side steps to R at EOB with LUE HHA min assist)              Stairs            Wheelchair Mobility    Modified Rankin (Stroke Patients Only)       Balance Overall balance assessment: Needs assistance Sitting-balance support: Feet supported Sitting balance-Leahy Scale: Good     Standing balance support: Single extremity supported Standing balance-Leahy Scale: Fair                               Pertinent Vitals/Pain Pain Assessment: Faces Faces Pain Scale: Hurts even more Pain Location: RUE with movement Pain Descriptors / Indicators: Discomfort;Grimacing Pain Intervention(s): Repositioned;Monitored during session    Home Living Family/patient expects to be discharged to::  (independent living, level apartment but on 4th floor)                      Prior Function Level of Independence: Independent         Comments: Pt reports she preferred negotiating 4 flights to her apartment vs using elevator PTA. Independent without AD.  Cooks meals for herself.     Hand Dominance        Extremity/Trunk Assessment   Upper Extremity Assessment Upper Extremity Assessment:  (pt limits RUE movement 2/2 edema, erythema & pain)    Lower Extremity Assessment Lower Extremity Assessment: Generalized weakness  Communication   Communication: No difficulties  Cognition Arousal/Alertness: Awake/alert Behavior During Therapy: WFL for tasks assessed/performed Overall Cognitive Status: Within Functional Limits for tasks assessed                                 General Comments: very pleasant, sweet lady      General Comments      Exercises     Assessment/Plan    PT Assessment Patient needs continued PT services  PT Problem List         PT Treatment Interventions DME instruction;Functional mobility training;Patient/family education;Balance  training;Gait training;Therapeutic activities;Modalities;Neuromuscular re-education;Manual techniques;Therapeutic exercise;Stair training    PT Goals (Current goals can be found in the Care Plan section)  Acute Rehab PT Goals Patient Stated Goal: to get better PT Goal Formulation: With patient Time For Goal Achievement: 04/06/20 Potential to Achieve Goals: Good    Frequency Min 2X/week   Barriers to discharge        Co-evaluation               AM-PAC PT "6 Clicks" Mobility  Outcome Measure Help needed turning from your back to your side while in a flat bed without using bedrails?: A Little Help needed moving from lying on your back to sitting on the side of a flat bed without using bedrails?: A Little Help needed moving to and from a bed to a chair (including a wheelchair)?: A Little Help needed standing up from a chair using your arms (e.g., wheelchair or bedside chair)?: A Little Help needed to walk in hospital room?: A Little Help needed climbing 3-5 steps with a railing? : A Lot 6 Click Score: 17    End of Session   Activity Tolerance: Patient tolerated treatment well Patient left: in bed;with bed alarm set;with call bell/phone within reach   PT Visit Diagnosis: Unsteadiness on feet (R26.81);Muscle weakness (generalized) (M62.81);Difficulty in walking, not elsewhere classified (R26.2);Pain    Time: 0865-7846 PT Time Calculation (min) (ACUTE ONLY): 21 min   Charges:   PT Evaluation $PT Eval Low Complexity: 1 Low PT Treatments $Therapeutic Activity: 8-22 mins        Aleda Grana, PT, DPT 03/23/20, 3:01 PM   Sandi Mariscal 03/23/2020, 2:59 PM

## 2020-03-24 DIAGNOSIS — R7881 Bacteremia: Secondary | ICD-10-CM | POA: Diagnosis not present

## 2020-03-24 DIAGNOSIS — E440 Moderate protein-calorie malnutrition: Secondary | ICD-10-CM

## 2020-03-24 DIAGNOSIS — B9620 Unspecified Escherichia coli [E. coli] as the cause of diseases classified elsewhere: Secondary | ICD-10-CM | POA: Diagnosis not present

## 2020-03-24 DIAGNOSIS — A499 Bacterial infection, unspecified: Secondary | ICD-10-CM | POA: Diagnosis not present

## 2020-03-24 DIAGNOSIS — L03113 Cellulitis of right upper limb: Secondary | ICD-10-CM | POA: Diagnosis not present

## 2020-03-24 DIAGNOSIS — E119 Type 2 diabetes mellitus without complications: Secondary | ICD-10-CM

## 2020-03-24 DIAGNOSIS — C9110 Chronic lymphocytic leukemia of B-cell type not having achieved remission: Secondary | ICD-10-CM | POA: Diagnosis not present

## 2020-03-24 LAB — ECHOCARDIOGRAM COMPLETE
AR max vel: 1.54 cm2
AV Peak grad: 12.5 mmHg
Ao pk vel: 1.77 m/s
Area-P 1/2: 6.83 cm2
Height: 60 in
S' Lateral: 2.8 cm
Weight: 1552 oz

## 2020-03-24 LAB — CBC
HCT: 25.3 % — ABNORMAL LOW (ref 36.0–46.0)
Hemoglobin: 8.4 g/dL — ABNORMAL LOW (ref 12.0–15.0)
MCH: 29.9 pg (ref 26.0–34.0)
MCHC: 33.2 g/dL (ref 30.0–36.0)
MCV: 90 fL (ref 80.0–100.0)
Platelets: 24 10*3/uL — CL (ref 150–400)
RBC: 2.81 MIL/uL — ABNORMAL LOW (ref 3.87–5.11)
RDW: 18.3 % — ABNORMAL HIGH (ref 11.5–15.5)
WBC: 38.3 10*3/uL — ABNORMAL HIGH (ref 4.0–10.5)
nRBC: 0 % (ref 0.0–0.2)

## 2020-03-24 LAB — BASIC METABOLIC PANEL
Anion gap: 8 (ref 5–15)
BUN: 13 mg/dL (ref 8–23)
CO2: 20 mmol/L — ABNORMAL LOW (ref 22–32)
Calcium: 7.5 mg/dL — ABNORMAL LOW (ref 8.9–10.3)
Chloride: 105 mmol/L (ref 98–111)
Creatinine, Ser: 0.47 mg/dL (ref 0.44–1.00)
GFR, Estimated: >60 mL/min (ref >60)
Glucose, Bld: 99 mg/dL (ref 70–99)
Potassium: 3.9 mmol/L (ref 3.5–5.1)
Sodium: 133 mmol/L — ABNORMAL LOW (ref 135–145)

## 2020-03-24 LAB — MAGNESIUM: Magnesium: 2 mg/dL (ref 1.7–2.4)

## 2020-03-24 LAB — GLUCOSE, CAPILLARY
Glucose-Capillary: 91 mg/dL (ref 70–99)
Glucose-Capillary: 122 mg/dL — ABNORMAL HIGH (ref 70–99)
Glucose-Capillary: 112 mg/dL — ABNORMAL HIGH (ref 70–99)
Glucose-Capillary: 93 mg/dL (ref 70–99)

## 2020-03-24 NOTE — Progress Notes (Signed)
PROGRESS NOTE    Jacqueline Russo   [id number]  DOB: June 25, 1929  PCP: [name], MD    DOA: 03/20/2020 LOS: 4   Brief Narrative   Jacqueline Russo is a 87 y.o. female with medical history significant of CLL with chemo, peripheral neuropathy, hyperlipidemia, gout type 2 diabetes, CKD stage III, vitamin B 12 deficiency, MDS, carotid stenosis, malnutrition presented with right hand, arm pain, swelling.  Also with associated severe pain and chills, fevers up to 102 F.  She'd had a small wound to the skin on her hand which progressively worsening to current state.     In the ED - temp 100 F, HR in 100's to 110's, BP uncontrolled with systolic as high as [id number].  Labs with no leukocytosis, normal lactate, mild hyponatremia and hypokelemia, WBC 89k (hx CLL), mild anemia 10.4.   Imaging - xrays of the right hand and wrist showed severe OA, no bony abnormalities.  RUE duplex U/S negative for DVT.   Treated with IV meropenem, vancomycin in the ED and admitted to hospitalist service.     Assessment & Plan   Principal Problem:   Cellulitis Active Problems:   CLL (chronic lymphocytic leukemia) ([place])   DM type 2 with diabetic mixed hyperlipidemia (HCC)   Sepsis (HCC)   Abnormal CT of brain   Malnutrition of moderate degree   Sepsis 2/2 bacteremia and celluitis In the setting of immunocompromise given history of of CLL Presented with fever, tachycardia in setting of cellulitis and bacteremia Lactate1.3 --Continue Vancomycin and Merrem IV  E coli ESBL bacteremia --started on meropenem and vanc on admission Plan: --Antibiotics as above --Infectious disease consulted --follow repeat blood cx  Right hand,forearm swollen 2/2 cellulitis.  --ED started on meropenem and vancomycin,  --GenSurg consulted for concern of necrotizing soft tissue infection --CT scans of RUE showed no abscess or bony involvement Plan: --cont antibiotics as above --Surgery following --Low threshold to  re-image right upper extremity   Hypokalemia -replaced on 1/2.  Monitor BMP and replace as needed.  Hypomagnesemia -replaced on 1/2. Monitor Mg and replace as needed.   DM: sliding scale Novolog   Stage IV mantle cell lymphomaOncology consulted, Dr. Kirke Corin saw pt  Anemia: Hbg 7.9 this AM, will transfuse 1 unit pRBC's per oncology rec, deferoxamine ordered as well. Patient received blood transfusion this past week.  --transfuse to keep Hgb >=8, per oncology --Patient requires Desferal with all of her transfusions.  Thrombocytopenia: Multifactorial from malignancy and recent chemotherapy.  Platelet count is 24 - stable today --transfuse if needed to keep plt >-10  Abnormal CT of brain: Question age undefined ischemic change, but no history of CVA,currently nofocal neuro deficit.  Also question if any brain malignancy.   Will discuss with pt and get MRI of brain once clinically improved from infection standpoint  Leukocytosis -due to CLL, trending downward on antibiotics   Comorbidities  History significant ofCLL with chemo /history of MDS -follows Dr. Grayland Ormond.  Management of cytopenias as above.  Outpatient follow-up.  Peripheral neuropathy -appears not on medication PTA.  Monitor.  Consider trial of low-dose gabapentin if needed.  Hyperlipidemia -appears not to be on statin.  Continue aspirin  Gout  -currently stable without acute flare.  Takes etodolac and allopurinol outpatient.  Continue allopurinol.  Type 2 diabetes -sliding scale NovoLog  CKD stage IIIa -renal function is currently normal  Vitamin B 12 deficiency -receives monthly IM injection  carotid stenosis -stable no acute issue  Malnutrition  -chronic.  Dietitian consulted   DVT prophylaxis: SCDs Start: 03/20/20 1932   Diet:  Diet Orders (From admission, onward)    Start     Ordered   03/20/20 1935  Diet Carb Modified Fluid consistency: Thin; Room service appropriate? Yes  Diet effective now        Question Answer Comment  Diet-HS Snack? Nothing   Calorie Level Medium 1600-2000   Fluid consistency: Thin   Room service appropriate? Yes      03/20/20 1940            Code Status: Full Code    Subjective 03/24/20    Patient seen today with sister at bedside.  Patient up in chair.  Continues to have severe pain of the right arm with any touch or movement.  Reports intermittent chills but no fevers.  No abdominal pain, chest pain, nausea or vomiting.  She is concerned about her legs being very weak.  Asks about how many more days she will be here.   Disposition Plan & Communication   Status is: Inpatient  Remains inpatient appropriate because:IV treatments appropriate due to intensity of illness or inability to take PO .  Requires IV antibiotics for bacteremia and severe cellulitis as above.  Dispo: The patient is from: Home              Anticipated d/c is to: Home              Anticipated d/c date is: 3 days              Patient currently is not medically stable to d/c.     Consults, Procedures, Significant Events   Consultants:   Oncology  General Surgery  Infectious disease  Procedures:   none  Antimicrobials:  Anti-infectives (From admission, onward)   Start     Dose/Rate Route Frequency Ordered Stop   03/23/20 1900  vancomycin (VANCOCIN) IVPB 1000 mg/200 mL premix        1,000 mg 200 mL/hr over 60 Minutes Intravenous Every 24 hours 03/23/20 1808     03/21/20 1800  vancomycin (VANCOREADY) IVPB 500 mg/100 mL  Status:  Discontinued        500 mg 100 mL/hr over 60 Minutes Intravenous Every 24 hours 03/20/20 1945 03/23/20 1808   03/21/20 1800  meropenem (MERREM) 1 g in sodium chloride 0.9 % 100 mL IVPB        1 g 200 mL/hr over 30 Minutes Intravenous Every 12 hours 03/21/20 1302     03/20/20 1545  vancomycin (VANCOCIN) IVPB 1000 mg/200 mL premix        1,000 mg 200 mL/hr over 60 Minutes Intravenous  Once 03/20/20 1537 03/20/20 2203   03/20/20 1545   meropenem (MERREM) 1 g in sodium chloride 0.9 % 100 mL IVPB  Status:  Discontinued        1 g 200 mL/hr over 30 Minutes Intravenous Every 8 hours 03/20/20 1537 03/21/20 1302        Objective   Vitals:   03/24/20 0425 03/24/20 0929 03/24/20 1232 03/24/20 1628  BP: (!) 150/72 138/74 (!) 145/54 (!) 142/67  Pulse: (!) 101 (!) 104 100 (!) 101  Resp: 17 18 16 16   Temp: 98.2 F (36.8 C) 98 F (36.7 C) 98 F (36.7 C) 99.1 F (37.3 C)  TempSrc:  Oral    SpO2: 96% 93%  98%  Weight:      Height:        Intake/Output Summary (  Last 24 hours) at 03/24/2020 1632 Last data filed at 03/24/2020 0523 Gross per 24 hour  Intake 595.45 ml  Output --  Net 595.45 ml   Filed Weights   03/20/20 1219  Weight: 44 kg    Physical Exam:  General exam: awake, alert, no acute distress Respiratory system: normal respiratory effort, on room air. Cardiovascular system: normal S1/S2, RRR, no pedal edema.   Central nervous system: A&O x3. no gross focal neurologic deficits, normal speech Extremities: RUE continues to be extremely tender on light palpation, edema improving starting to see some wrinkling of skin, erythema is stable to improved Psychiatry: normal mood, congruent affect, judgement and insight appear normal  Labs   Data Reviewed: I have personally reviewed following labs and imaging studies  CBC: Recent Labs  Lab 03/19/20 0824 03/20/20 1239 03/21/20 0753 03/22/20 0439 03/23/20 0529 03/24/20 0535  WBC 108.3* 89.2* 66.1* 69.2* 46.8* 38.3*  NEUTROABS 6.0 3.7  --   --   --   --   HGB 7.6* 10.4* 8.1* 7.9* 8.2* 8.4*  HCT 23.7* 31.5* 24.7* 24.7* 24.8* 25.3*  MCV 96.3 90.5 90.8 92.5 90.2 90.0  PLT 46* 34* 24* 24* 23* 24*   Basic Metabolic Panel: Recent Labs  Lab 03/19/20 0824 03/20/20 1239 03/21/20 0753 03/22/20 0439 03/23/20 0529 03/24/20 0535  NA 137 132* 132* 133* 132* 133*  K 3.0* 3.3* 3.1* 2.8* 4.0 3.9  CL 104 97* 100 99 103 105  CO2 20* 18* 21* 19* 21* 20*  GLUCOSE 137*  118* 130* 110* 132* 99  BUN 22 16 17 19 17 13   CREATININE 0.65 0.64 0.63 0.85 0.46 0.47  CALCIUM 8.4* 8.6* 7.2* 7.2* 7.1* 7.5*  MG 1.8  --   --  1.4* 2.4 2.0   GFR: Estimated Creatinine Clearance: 33.8 mL/min (by C-G formula based on SCr of 0.47 mg/dL). Liver Function Tests: Recent Labs  Lab 03/19/20 0824 03/20/20 1239 03/21/20 0753  AST 33 22 16  ALT 65* 48* 29  ALKPHOS 117 110 73  BILITOT 1.4* 2.1* 1.7*  PROT 6.7 6.8 5.1*  ALBUMIN 4.2 4.1 2.7*   No results for input(s): LIPASE, AMYLASE in the last 168 hours. No results for input(s): AMMONIA in the last 168 hours. Coagulation Profile: No results for input(s): INR, PROTIME in the last 168 hours. Cardiac Enzymes: Recent Labs  Lab 03/20/20 1239  CKTOTAL 45   BNP (last 3 results) No results for input(s): PROBNP in the last 8760 hours. HbA1C: No results for input(s): HGBA1C in the last 72 hours. CBG: Recent Labs  Lab 03/23/20 1214 03/23/20 1648 03/23/20 2121 03/24/20 0804 03/24/20 1223  GLUCAP 115* 110* 114* 91 122*   Lipid Profile: No results for input(s): CHOL, HDL, LDLCALC, TRIG, CHOLHDL, LDLDIRECT in the last 72 hours. Thyroid Function Tests: No results for input(s): TSH, T4TOTAL, FREET4, T3FREE, THYROIDAB in the last 72 hours. Anemia Panel: No results for input(s): VITAMINB12, FOLATE, FERRITIN, TIBC, IRON, RETICCTPCT in the last 72 hours. Sepsis Labs: Recent Labs  Lab 03/20/20 1239  LATICACIDVEN 1.3    Recent Results (from the past 240 hour(s))  Culture, blood (routine x 2)     Status: Abnormal   Collection Time: 03/20/20  5:30 PM   Specimen: BLOOD  Result Value Ref Range Status   Specimen Description   Final    BLOOD PORTA CATH Performed at [facility], [address], [place], [place] [address]    Special Requests   Final    BOTTLES  DRAWN AEROBIC AND ANAEROBIC Blood Culture adequate volume Performed at [facility], [address] [place], [place] [address]    Culture   Setup Time   Final    GRAM NEGATIVE RODS IN BOTH AEROBIC AND ANAEROBIC BOTTLES CRITICAL VALUE NOTED.  VALUE IS CONSISTENT WITH PREVIOUSLY REPORTED AND CALLED VALUE. Performed at [facility], [place] [address] [place], [place] [address]    Culture (A)  Final    ESCHERICHIA COLI Confirmed Extended Spectrum Beta-Lactamase Producer (ESBL).  In bloodstream infections from ESBL organisms, carbapenems are preferred over piperacillin/tazobactam. They are shown to have a lower risk of mortality.    Report Status 03/23/2020 FINAL  Final   Organism ID, Bacteria ESCHERICHIA COLI  Final      Susceptibility   Escherichia coli - MIC*    AMPICILLIN >=32 RESISTANT Resistant     CEFAZOLIN >=64 RESISTANT Resistant     CEFEPIME 16 RESISTANT Resistant     CEFTAZIDIME RESISTANT Resistant     CEFTRIAXONE >=64 RESISTANT Resistant     CIPROFLOXACIN >=4 RESISTANT Resistant     GENTAMICIN <=1 SENSITIVE Sensitive     IMIPENEM <=0.25 SENSITIVE Sensitive     TRIMETH/SULFA <=20 SENSITIVE Sensitive     AMPICILLIN/SULBACTAM 16 INTERMEDIATE Intermediate     PIP/TAZO <=4 SENSITIVE Sensitive     * ESCHERICHIA COLI  Culture, blood (routine x 2)     Status: Abnormal   Collection Time: 03/20/20  5:30 PM   Specimen: BLOOD  Result Value Ref Range Status   Specimen Description   Final    BLOOD LEFT ANTECUBITAL Performed at [facility], [address], [place], [place] [address]    Special Requests   Final    BOTTLES DRAWN AEROBIC AND ANAEROBIC Blood Culture adequate volume Performed at [facility], [address] [place], [place] [address]    Culture  Setup Time   Final    Organism ID to follow GRAM NEGATIVE RODS IN BOTH AEROBIC AND ANAEROBIC BOTTLES CRITICAL RESULT CALLED TO, READ BACK BY AND VERIFIED WITH: NATHAN BLEWED 03/21/20 AT [place]. MF Performed at [facility], [address] [place], [place] [address]    Culture (A)  Final    ESCHERICHIA COLI SUSCEPTIBILITIES  PERFORMED ON PREVIOUS CULTURE WITHIN THE LAST 5 DAYS. Performed at [facility], [place] [address], [place], [place] [address]    Report Status 03/23/2020 FINAL  Final  SARS CORONAVIRUS 2 (TAT 6-24 HRS) Nasopharyngeal Nasopharyngeal Swab     Status: None   Collection Time: 03/20/20  5:30 PM   Specimen: Nasopharyngeal Swab  Result Value Ref Range Status   SARS Coronavirus 2 NEGATIVE NEGATIVE Final    Comment: (NOTE) SARS-CoV-2 target nucleic acids are NOT DETECTED.  The SARS-CoV-2 RNA is generally detectable in upper and lower respiratory specimens during the acute phase of infection. Negative results do not preclude SARS-CoV-2 infection, do not rule out co-infections with other pathogens, and should not be used as the sole basis for treatment or other patient management decisions. Negative results must be combined with clinical observations, patient history, and epidemiological information. The expected result is Negative.  Fact Sheet for Patients: SugarRoll.be  Fact Sheet for Healthcare Providers: https://www.woods-mathews.com/  This test is not yet approved or cleared by the [place] FDA and  has been authorized for detection and/or diagnosis of SARS-CoV-2 by FDA under an Emergency Use Authorization (EUA). This EUA will remain  in effect (meaning this test can be used)  for the duration of the COVID-19 declaration under Se ction 564(b)(1) of the Act, 21 U.S.C. section 360bbb-3(b)(1), unless the authorization is terminated or revoked sooner.  Performed at [facility], [place] [address] [place], [place] [address]   Blood Culture ID Panel (Reflexed)     Status: Abnormal   Collection Time: 03/20/20  5:30 PM  Result Value Ref Range Status   Enterococcus faecalis NOT DETECTED NOT DETECTED Final   Enterococcus Faecium NOT DETECTED NOT DETECTED Final   Listeria monocytogenes NOT DETECTED NOT DETECTED Final   Staphylococcus  species NOT DETECTED NOT DETECTED Final   Staphylococcus aureus (BCID) NOT DETECTED NOT DETECTED Final   Staphylococcus epidermidis NOT DETECTED NOT DETECTED Final   Staphylococcus lugdunensis NOT DETECTED NOT DETECTED Final   Streptococcus species NOT DETECTED NOT DETECTED Final   Streptococcus agalactiae NOT DETECTED NOT DETECTED Final   Streptococcus pneumoniae NOT DETECTED NOT DETECTED Final   Streptococcus pyogenes NOT DETECTED NOT DETECTED Final   A.calcoaceticus-baumannii NOT DETECTED NOT DETECTED Final   Bacteroides fragilis NOT DETECTED NOT DETECTED Final   Enterobacterales DETECTED (A) NOT DETECTED Corrected    Comment:  NATHAN BELUE AT [id number] 03/21/20 MF/SDR Enterobacterales represent a large order of gram negative bacteria, not a single organism. CORRECTED ON 01/01 AT [id number]: PREVIOUSLY REPORTED AS DETECTED Enterobacterales represent a large order of gram negative bacteria, not a single organism. CRITICAL RESULT CALLED TO, READ BACK BY AND VERIFIED WITH:  NATHAN BELUE AT [id number] 03/22/20 MF/SDR    Enterobacter cloacae complex NOT DETECTED NOT DETECTED Final   Escherichia coli DETECTED (A) NOT DETECTED Corrected    Comment: CRITICAL RESULT CALLED TO, READ BACK BY AND VERIFIED WITH:  NATHAN BELUE AT [id number] 03/21/20 MF/SDR CORRECTED ON 01/01 AT [id number]: PREVIOUSLY REPORTED AS DETECTED CRITICAL RESULT CALLED TO, READ BACK BY AND VERIFIED WITH:  NATHAN BELUE AT [id number] 03/22/20 MF/SDR    Klebsiella aerogenes NOT DETECTED NOT DETECTED Final   Klebsiella oxytoca NOT DETECTED NOT DETECTED Final   Klebsiella pneumoniae NOT DETECTED NOT DETECTED Final   Proteus species NOT DETECTED NOT DETECTED Final   Salmonella species NOT DETECTED NOT DETECTED Final   Serratia marcescens NOT DETECTED NOT DETECTED Final   Haemophilus influenzae NOT DETECTED NOT DETECTED Final   Neisseria meningitidis NOT DETECTED NOT DETECTED Final   Pseudomonas aeruginosa NOT DETECTED NOT DETECTED Final   Stenotrophomonas maltophilia NOT  DETECTED NOT DETECTED Final   Candida albicans NOT DETECTED NOT DETECTED Final   Candida auris NOT DETECTED NOT DETECTED Final   Candida glabrata NOT DETECTED NOT DETECTED Final   Candida krusei NOT DETECTED NOT DETECTED Final   Candida parapsilosis NOT DETECTED NOT DETECTED Final   Candida tropicalis NOT DETECTED NOT DETECTED Final   Cryptococcus neoformans/gattii NOT DETECTED NOT DETECTED Final   CTX-M ESBL DETECTED (A) NOT DETECTED Corrected    Comment:  NATHAN BELUE AT [id number] 03/21/20 MF/SDR (NOTE) Extended spectrum beta-lactamase detected. Recommend a carbapenem as initial therapy.   CORRECTED ON 01/01 AT [id number]: PREVIOUSLY REPORTED AS DETECTED CRITICAL RESULT CALLED TO, READ BACK BY AND VERIFIED WITH:  NATHAN BELUE AT [id number] 03/22/20 MF/SDR    Carbapenem resistance IMP NOT DETECTED NOT DETECTED Final   Carbapenem resistance KPC NOT DETECTED NOT DETECTED Final   Carbapenem resistance NDM NOT DETECTED NOT DETECTED Final   Carbapenem resist OXA 48 LIKE NOT DETECTED NOT DETECTED Final   Carbapenem resistance VIM NOT DETECTED NOT DETECTED Final    Comment: Performed at [facility], [place] [place], [place] [address]  Culture, blood (Routine X 2) w Reflex to ID Panel     Status: None (Preliminary result)   Collection Time: 03/22/20  4:39 AM   Specimen: BLOOD  Result Value Ref Range Status   Specimen Description BLOOD RIGHT WRIST  Final   Special Requests   Final    BOTTLES DRAWN AEROBIC AND ANAEROBIC Blood Culture adequate volume   Culture   Final    NO GROWTH 2 DAYS Performed at [facility], [address] [place], [place] [address]    Report Status PENDING  Incomplete  Culture, blood (Routine X 2) w Reflex to ID Panel     Status: None (Preliminary result)   Collection Time: 03/22/20  4:41 AM   Specimen: BLOOD  Result Value Ref Range Status   Specimen Description BLOOD RIGHT [facility]  Final   Special Requests   Final    BOTTLES DRAWN AEROBIC AND ANAEROBIC  Blood Culture adequate volume   Culture   Final    NO GROWTH 2 DAYS Performed at [facility], [address], [place], [place] [address]    Report Status PENDING  Incomplete      Imaging Studies   ECHOCARDIOGRAM COMPLETE  Result Date: 03/24/2020    ECHOCARDIOGRAM REPORT   Patient Name:   Jacqueline Russo Date of Exam: 03/23/2020 Medical Rec #:  [id number]   Height:       60.0 in Accession #:    [id number]  Weight:       97.0 lb Date of Birth:  29-Dec-1929  BSA:          1.372 m Patient Age:    40 years    BP:           182/80 mmHg Patient Gender: F           HR:           110 bpm. Exam Location:  [facility] Procedure: 2D Echo, Cardiac Doppler and Color Doppler Indications:     Bacteremia R78.81  History:         Patient has no prior history of Echocardiogram examinations.                  Risk Factors:Hypertension.  Sonographer:     Alyse Low Roar Referring Phys:  [id number] Tsosie Billing Diagnosing Phys: Nelva Bush MD IMPRESSIONS  1. Left ventricular ejection fraction, by estimation, is 60 to 65%. The left ventricle has normal function. The left ventricle has no regional wall motion abnormalities. Left ventricular diastolic parameters are consistent with Grade I diastolic dysfunction (impaired relaxation). Elevated left atrial pressure.  2. Right ventricular systolic function is normal. The right ventricular size is normal. There is moderately elevated pulmonary artery systolic pressure.  3. Left atrial size was moderately dilated.  4. The pericardial effusion is posterior to the left ventricle.  5. The mitral valve is abnormal. Mild mitral valve regurgitation. Mild mitral stenosis. Moderate mitral annular calcification.  6. The aortic valve was not well visualized. There is mild calcification of the aortic valve. There is mild thickening of the aortic valve. Aortic valve regurgitation is not visualized. No aortic stenosis is present.  7. The inferior vena cava is dilated in size with <50%  respiratory variability, suggesting right atrial pressure of 15 mmHg. FINDINGS  Left Ventricle: Left ventricular ejection fraction, by estimation, is 60 to 65%. The left ventricle has normal function. The left ventricle has no regional wall motion abnormalities.  The left ventricular internal cavity size was normal in size. There is  borderline left ventricular hypertrophy. Left ventricular diastolic parameters are consistent with Grade I diastolic dysfunction (impaired relaxation). Elevated left atrial pressure. Right Ventricle: The right ventricular size is normal. No increase in right ventricular wall thickness. Right ventricular systolic function is normal. There is moderately elevated pulmonary artery systolic pressure. The tricuspid regurgitant velocity is 2.97 m/s, and with an assumed right atrial pressure of 15 mmHg, the estimated right ventricular systolic pressure is 0000000 mmHg. Left Atrium: Left atrial size was moderately dilated. Right Atrium: Right atrial size was normal in size. Pericardium: Trivial pericardial effusion is present. The pericardial effusion is posterior to the left ventricle. Mitral Valve: The mitral valve is abnormal. There is mild thickening of the mitral valve leaflet(s). There is decreased mobility of the posterior leaflet. Moderate mitral annular calcification. Mild mitral valve regurgitation. Mild mitral valve stenosis. Tricuspid Valve: The tricuspid valve is not well visualized. Tricuspid valve regurgitation is mild. Aortic Valve: The aortic valve was not well visualized. There is mild calcification of the aortic valve. There is mild thickening of the aortic valve. Aortic valve regurgitation is not visualized. No aortic stenosis is present. Aortic valve peak gradient  measures 12.5 mmHg. Pulmonic Valve: The pulmonic valve was not well visualized. Pulmonic valve regurgitation is not visualized. No evidence of pulmonic stenosis. Aorta: The aortic root and ascending aorta are  structurally normal, with no evidence of dilitation. Pulmonary Artery: The pulmonary artery is of normal size. Venous: The inferior vena cava is dilated in size with less than 50% respiratory variability, suggesting right atrial pressure of 15 mmHg. IAS/Shunts: No atrial level shunt detected by color flow Doppler.  LEFT VENTRICLE PLAX 2D LVIDd:         4.10 cm  Diastology LVIDs:         2.80 cm  LV e' medial:    4.03 cm/s LV PW:         0.90 cm  LV E/e' medial:  30.0 LV IVS:        0.99 cm  LV e' lateral:   3.48 cm/s LVOT diam:     1.70 cm  LV E/e' lateral: 34.8 LVOT Area:     2.27 cm  RIGHT VENTRICLE RV Mid diam:    3.40 cm RV S prime:     14.80 cm/s TAPSE (M-mode): 2.1 cm LEFT ATRIUM             Index       RIGHT ATRIUM           Index LA diam:        3.35 cm 2.44 cm/m  RA Area:     16.40 cm LA Vol (A2C):   74.3 ml 54.14 ml/m RA Volume:   41.70 ml  30.38 ml/m LA Vol (A4C):   66.6 ml 48.53 ml/m LA Biplane Vol: 70.7 ml 51.51 ml/m  AORTIC VALVE                PULMONIC VALVE AV Area (Vmax): 1.54 cm    PV Vmax:        0.90 m/s AV Vmax:        177.00 cm/s PV Peak grad:   3.2 mmHg AV Peak Grad:   12.5 mmHg   RVOT Peak grad: 1 mmHg LVOT Vmax:      120.00 cm/s  AORTA Ao Root diam: 2.35 cm Ao Asc diam:  2.50 cm MITRAL VALVE  TRICUSPID VALVE MV Area (PHT): 6.83 cm     TR Peak grad:   35.3 mmHg MV Decel Time: 111 msec     TR Vmax:        297.00 cm/s MV E velocity: 121.00 cm/s MV A velocity: 173.00 cm/s  SHUNTS MV E/A ratio:  0.70         Systemic Diam: 1.70 cm MV A Prime:    16.2 cm/s Yvonne Kendall MD Electronically signed by Yvonne Kendall MD Signature Date/Time: 03/24/2020/7:09:57 AM    Final      Medications   Scheduled Meds: . sodium chloride   Intravenous Once  . allopurinol  300 mg Oral Daily  . Chlorhexidine Gluconate Cloth  6 each Topical Daily  . feeding supplement  1 Container Oral BID BM  . hydrALAZINE  50 mg Oral BID  . hydroxyurea  1,000 mg Oral Q breakfast  . insulin aspart   0-5 Units Subcutaneous QHS  . insulin aspart  0-9 Units Subcutaneous TID WC  . methocarbamol  500 mg Oral TID  . mirtazapine  7.5 mg Oral QHS  . multivitamin with minerals  1 tablet Oral Daily  . sodium chloride flush  10-40 mL Intracatheter Q12H  . sodium chloride flush  3 mL Intravenous Q12H   Continuous Infusions: . meropenem (MERREM) IV 1 g (03/24/20 0702)  . vancomycin Stopped (03/23/20 1930)       LOS: 4 days    Time spent: 25 minutes with > 50% spent at bedside and in coordination of care.    Pennie Banter, DO [facility]  03/24/2020, 4:32 PM    If 7PM-7AM, please contact night-coverage. How to contact the [facility] Attending or Consulting provider 7A - 7P or covering provider during after hours 7P -7A, for this patient?    1. Check the care team in [facility] and look for a) attending/consulting [facility] provider listed and b) the [facility] team listed 2. Log into [email or web address] and use [place]'s universal password to access. If you do not have the password, please contact the hospital operator. 3. Locate the [facility] provider you are looking for under [facility] and page to a number that you can be directly reached. 4. If you still have difficulty reaching the provider, please page the [facility] (Director on Call) for the Hospitalists listed on amion for assistance.

## 2020-03-24 NOTE — Progress Notes (Signed)
Mobility Specialist - Progress Note   03/24/20 1030  Mobility  Activity Refused mobility  Mobility performed by Mobility specialist    Pt refused session at this time. States she is having "5/10" pain in RUE. Requested mobility specialist return after she receives pain med. Will hold at this time and re-attempt at a later time, if time permits. Nurse was notified.    Jerzy Roepke Mobility Specialist  03/24/20, 10:31 AM

## 2020-03-24 NOTE — TOC Initial Note (Signed)
Transition of Care [facility]) - Initial/Assessment Note    Patient Details  Name: Jacqueline Russo MRN: [id number] Date of Birth: 1929-08-31  Transition of Care [facility]) CM/SW Contact:    Chapman Fitch, RN Phone Number: 03/24/2020, 2:53 PM  Clinical Narrative:                  PT has assessed patient and recommends home health PT Per progression rounds there is potential patient will need IV antibiotics at discharge.    Attempted to complete assessment with patient, however MD currently at bedside.  TOC to follow up       Patient Goals and CMS Choice        Expected Discharge Plan and Services                                                Prior Living Arrangements/Services                       Activities of Daily Living Home Assistive Devices/Equipment: Dan Humphreys (specify type) ADL Screening (condition at time of admission) Patient's cognitive ability adequate to safely complete daily activities?: Yes Is the patient deaf or have difficulty hearing?: No Does the patient have difficulty seeing, even when wearing glasses/contacts?: Yes Does the patient have difficulty concentrating, remembering, or making decisions?: No Patient able to express need for assistance with ADLs?: Yes Does the patient have difficulty dressing or bathing?: Yes Independently performs ADLs?: Yes (appropriate for developmental age) Does the patient have difficulty walking or climbing stairs?: Yes Weakness of Legs: Both Weakness of Arms/Hands: Right  Permission Sought/Granted                  Emotional Assessment              Admission diagnosis:  Cellulitis [L03.90] Arm swelling [M79.89] CLL (chronic lymphocytic leukemia) (HCC) [C91.10] Cellulitis of right upper extremity [L03.113] Sepsis (HCC) [A41.9] Sepsis, due to unspecified organism, unspecified whether acute organ dysfunction present [facility]) [A41.9] Patient Active Problem List   Diagnosis Date Noted  .  Malnutrition of moderate degree 03/24/2020  . Arm swelling   . Sepsis (HCC) 03/20/2020  . Cellulitis 03/20/2020  . Abnormal CT of brain 03/20/2020  . Mantle cell lymphoma (HCC) 03/05/2020  . Goals of care, counseling/discussion 03/05/2020  . Anxiety 05/17/2019  . Hypokalemia due to inadequate potassium intake 05/17/2019  . Symptomatic anemia 05/17/2019  . Iron overload 05/17/2019  . MDS (myelodysplastic syndrome) with 5q deletion (HCC) 05/16/2019  . Port-A-Cath in place 02/18/2019  . Exertional dyspnea 10/04/2018  . Gouty arthropathy, chronic, without tophi 07/04/2018  . Right renal mass 02/28/2018  . DM type 2 with diabetic mixed hyperlipidemia (HCC) 08/25/2017  . Idiopathic peripheral neuropathy 02/22/2017  . CLL (chronic lymphocytic leukemia) (HCC) 11/16/2015  . Myelodysplastic syndrome (HCC) 11/16/2015  . Vitamin B12 deficient megaloblastic anemia 10/22/2015  . Carotid stenosis, bilateral 07/21/2015  . Diet-controlled type 2 diabetes mellitus (HCC) 04/22/2015  . CKD (chronic kidney disease) stage 3, GFR 30-59 ml/min (HCC) 04/22/2015  . Benign essential hypertension 01/14/2014  . Hyperlipemia 01/14/2014  . Primary osteoarthritis of both hands 01/14/2014   PCP:  Danella Penton, MD Pharmacy:   [facility] - [place], [place] - [address] [place] [address] Phone: [phone] Fax: [phone]  [place], [place] [address] Phone: [phone] Fax: [phone]     Social Determinants of Health (SDOH) Interventions    Readmission Risk Interventions No flowsheet data found.

## 2020-03-24 NOTE — Progress Notes (Signed)
Mobility Specialist - Progress Note   03/24/20 1217  Mobility  Activity Transferred:  Bed to chair  Level of Assistance Minimal assist, patient does 75% or more  Assistive Device Front wheel walker  Distance Ambulated (ft) 3 ft  Mobility Response Tolerated well  Mobility performed by Mobility specialist  $Mobility charge 1 Mobility    Per chart review, pt recently received pain med. Re-attempted to see pt at this time. Pt laying in bed w/ sister present in room upon arrival. Pt agreed to session. Pt able to get to EOB w/ min. Assist for UE support d/t pain in RUE. While dangling EOB, pt c/o dizziness. Dizziness relieved after sitting EOB for ~ 5 minutes. Pt S2S SBA. VC required for hand placement and sequencing. Pt transferred from bed to chair w/ min. Assist. Required assist w/ managing RW during transfer d/t having "extreme pain" in RUE. Noted pt "feeling shaky" initially standing up. States she's feeling anxious and nervous, but is motivated to improve. Overall, pt tolerated session well. Pt pleasant and motivated t/o session. Pt left sitting on recliner w/ sister present in room. All needs placed in reach.     Frances Ambrosino Mobility Specialist  03/24/20, 12:27 PM

## 2020-03-24 NOTE — Progress Notes (Signed)
03/24/2020  Subjective: No acute events.  Patient reports that the pain may be somewhat improving but the swelling has improved. She is able to move her fingers and hand/wrist better than before.    Vital signs: Temp:  [98 F (36.7 C)-99.1 F (37.3 C)] 98.9 F (37.2 C) (01/04 1950) Pulse Rate:  [100-104] 102 (01/04 1950) Resp:  [16-20] 20 (01/04 1950) BP: (138-155)/(54-74) 155/64 (01/04 1950) SpO2:  [93 %-98 %] 94 % (01/04 1950)   Intake/Output: 01/03 0701 - 01/04 0700 In: 595.5 [IV Piggyback:595.5] Out: -  Last BM Date: 03/19/20  Physical Exam: Constitutional:  No acute distress Extremity:  Right arm with improved distal edema, now can move fingers/grip/wrist much better than on initial evaluation.  There is still edema at the forearm and distal upper arm level.  This is pitting edema, and there is no fluctuance noted.  Labs:  Recent Labs    03/23/20 0529 03/24/20 0535  WBC 46.8* 38.3*  HGB 8.2* 8.4*  HCT 24.8* 25.3*  PLT 23* 24*   Recent Labs    03/23/20 0529 03/24/20 0535  NA 132* 133*  K 4.0 3.9  CL 103 105  CO2 21* 20*  GLUCOSE 132* 99  BUN 17 13  CREATININE 0.46 0.47  CALCIUM 7.1* 7.5*   No results for input(s): LABPROT, INR in the last 72 hours.  Imaging: No results found.  Assessment/Plan: This is a 87 y.o. female with right upper extremity cellulitis and edema, with E coli esbl bacteremia.  --There has been improvement in the edema and range of motion in her RUE, as well as some improvement in pain.  On exam, there is pitting edema but no evidence of fluctuance or abscess that would need drainage.  Do not think another CT scan is needed at this point. --Continue antibiotic management per ID recs. --Surgery team will sign off for now.  Please let us know if any questions, concerns.   Howie Ill, MD [facility]

## 2020-03-24 NOTE — Progress Notes (Signed)
Date of Admission:  03/20/2020     ID: Jacqueline Russo is a 87 y.o. female Principal Problem:   Cellulitis Active Problems:   CLL (chronic lymphocytic leukemia) (HCC)   DM type 2 with diabetic mixed hyperlipidemia (HCC)   Sepsis (HCC)   Abnormal CT of brain   Malnutrition of moderate degree    Subjective: Patient states his feet she is feeling better Right arm swelling and pain better but movement of the arm still restricted because of pain  Medications:  . sodium chloride   Intravenous Once  . allopurinol  300 mg Oral Daily  . Chlorhexidine Gluconate Cloth  6 each Topical Daily  . feeding supplement  1 Container Oral BID BM  . hydrALAZINE  50 mg Oral BID  . hydroxyurea  1,000 mg Oral Q breakfast  . insulin aspart  0-5 Units Subcutaneous QHS  . insulin aspart  0-9 Units Subcutaneous TID WC  . methocarbamol  500 mg Oral TID  . mirtazapine  7.5 mg Oral QHS  . multivitamin with minerals  1 tablet Oral Daily  . sodium chloride flush  10-40 mL Intracatheter Q12H  . sodium chloride flush  3 mL Intravenous Q12H    Objective: Vital signs in last 24 hours: Temp:  [98 F (36.7 C)-99.8 F (37.7 C)] 98 F (36.7 C) (01/04 1232) Pulse Rate:  [100-110] 100 (01/04 1232) Resp:  [16-20] 16 (01/04 1232) BP: (138-182)/(54-80) 145/54 (01/04 1232) SpO2:  [93 %-97 %] 93 % (01/04 0929)  PHYSICAL EXAM:  General: Alert, cooperative, no distress in the arm is not moved, sitting in chair. Head: Normocephalic, without obvious abnormality, atraumatic.  Lungs: Clear to auscultation bilaterally. No Wheezing or Rhonchi. No rales. Heart: Regular rate and rhythm, no murmur, rub or gallop. Extremities: Right arm.  Swelling of the forearm and about the cubital fossa is less.  There is erythema and tenderness The dorsum of the hand swelling is resolved.  There is healed the tear. Skin: Thin and easy bruising Lymph: Cervical, supraclavicular normal. Neurologic: Grossly non-focal  Lab Results Recent  Labs    03/23/20 0529 03/24/20 0535  WBC 46.8* 38.3*  HGB 8.2* 8.4*  HCT 24.8* 25.3*  NA 132* 133*  K 4.0 3.9  CL 103 105  CO2 21* 20*  BUN 17 13  CREATININE 0.46 0.47   Liver Panel No results for input(s): PROT, ALBUMIN, AST, ALT, ALKPHOS, BILITOT, BILIDIR, IBILI in the last 72 hours. Sedimentation Rate No results for input(s): ESRSEDRATE in the last 72 hours. C-Reactive Protein No results for input(s): CRP in the last 72 hours.  Microbiology:  Studies/Results: ECHOCARDIOGRAM COMPLETE  Result Date: 03/24/2020    ECHOCARDIOGRAM REPORT   Patient Name:   SHARI NATT Date of Exam: 03/23/2020 Medical Rec #:  [id number]   Height:       60.0 in Accession #:    [id number]  Weight:       97.0 lb Date of Birth:  1929-12-07  BSA:          1.372 m Patient Age:    89 years    BP:           182/80 mmHg Patient Gender: F           HR:           110 bpm. Exam Location:  [facility] Procedure: 2D Echo, Cardiac Doppler and Color Doppler Indications:     Bacteremia R78.81  History:  Patient has no prior history of Echocardiogram examinations.                  Risk Factors:Hypertension.  Sonographer:     Alyse Low Roar Referring Phys:  [id number] Tsosie Billing Diagnosing Phys: Nelva Bush MD IMPRESSIONS  1. Left ventricular ejection fraction, by estimation, is 60 to 65%. The left ventricle has normal function. The left ventricle has no regional wall motion abnormalities. Left ventricular diastolic parameters are consistent with Grade I diastolic dysfunction (impaired relaxation). Elevated left atrial pressure.  2. Right ventricular systolic function is normal. The right ventricular size is normal. There is moderately elevated pulmonary artery systolic pressure.  3. Left atrial size was moderately dilated.  4. The pericardial effusion is posterior to the left ventricle.  5. The mitral valve is abnormal. Mild mitral valve regurgitation. Mild mitral stenosis. Moderate mitral annular calcification.  6. The  aortic valve was not well visualized. There is mild calcification of the aortic valve. There is mild thickening of the aortic valve. Aortic valve regurgitation is not visualized. No aortic stenosis is present.  7. The inferior vena cava is dilated in size with <50% respiratory variability, suggesting right atrial pressure of 15 mmHg. FINDINGS  Left Ventricle: Left ventricular ejection fraction, by estimation, is 60 to 65%. The left ventricle has normal function. The left ventricle has no regional wall motion abnormalities. The left ventricular internal cavity size was normal in size. There is  borderline left ventricular hypertrophy. Left ventricular diastolic parameters are consistent with Grade I diastolic dysfunction (impaired relaxation). Elevated left atrial pressure. Right Ventricle: The right ventricular size is normal. No increase in right ventricular wall thickness. Right ventricular systolic function is normal. There is moderately elevated pulmonary artery systolic pressure. The tricuspid regurgitant velocity is 2.97 m/s, and with an assumed right atrial pressure of 15 mmHg, the estimated right ventricular systolic pressure is 0000000 mmHg. Left Atrium: Left atrial size was moderately dilated. Right Atrium: Right atrial size was normal in size. Pericardium: Trivial pericardial effusion is present. The pericardial effusion is posterior to the left ventricle. Mitral Valve: The mitral valve is abnormal. There is mild thickening of the mitral valve leaflet(s). There is decreased mobility of the posterior leaflet. Moderate mitral annular calcification. Mild mitral valve regurgitation. Mild mitral valve stenosis. Tricuspid Valve: The tricuspid valve is not well visualized. Tricuspid valve regurgitation is mild. Aortic Valve: The aortic valve was not well visualized. There is mild calcification of the aortic valve. There is mild thickening of the aortic valve. Aortic valve regurgitation is not visualized. No aortic  stenosis is present. Aortic valve peak gradient  measures 12.5 mmHg. Pulmonic Valve: The pulmonic valve was not well visualized. Pulmonic valve regurgitation is not visualized. No evidence of pulmonic stenosis. Aorta: The aortic root and ascending aorta are structurally normal, with no evidence of dilitation. Pulmonary Artery: The pulmonary artery is of normal size. Venous: The inferior vena cava is dilated in size with less than 50% respiratory variability, suggesting right atrial pressure of 15 mmHg. IAS/Shunts: No atrial level shunt detected by color flow Doppler.  LEFT VENTRICLE PLAX 2D LVIDd:         4.10 cm  Diastology LVIDs:         2.80 cm  LV e' medial:    4.03 cm/s LV PW:         0.90 cm  LV E/e' medial:  30.0 LV IVS:        0.99 cm  LV e' lateral:  3.48 cm/s LVOT diam:     1.70 cm  LV E/e' lateral: 34.8 LVOT Area:     2.27 cm  RIGHT VENTRICLE RV Mid diam:    3.40 cm RV S prime:     14.80 cm/s TAPSE (M-mode): 2.1 cm LEFT ATRIUM             Index       RIGHT ATRIUM           Index LA diam:        3.35 cm 2.44 cm/m  RA Area:     16.40 cm LA Vol (A2C):   74.3 ml 54.14 ml/m RA Volume:   41.70 ml  30.38 ml/m LA Vol (A4C):   66.6 ml 48.53 ml/m LA Biplane Vol: 70.7 ml 51.51 ml/m  AORTIC VALVE                PULMONIC VALVE AV Area (Vmax): 1.54 cm    PV Vmax:        0.90 m/s AV Vmax:        177.00 cm/s PV Peak grad:   3.2 mmHg AV Peak Grad:   12.5 mmHg   RVOT Peak grad: 1 mmHg LVOT Vmax:      120.00 cm/s  AORTA Ao Root diam: 2.35 cm Ao Asc diam:  2.50 cm MITRAL VALVE                TRICUSPID VALVE MV Area (PHT): 6.83 cm     TR Peak grad:   35.3 mmHg MV Decel Time: 111 msec     TR Vmax:        297.00 cm/s MV E velocity: 121.00 cm/s MV A velocity: 173.00 cm/s  SHUNTS MV E/A ratio:  0.70         Systemic Diam: 1.70 cm MV A Prime:    16.2 cm/s Nelva Bush MD Electronically signed by Nelva Bush MD Signature Date/Time: 03/24/2020/7:09:57 AM    Final      Assessment/Plan: ESBL E. coli bacteremia with  skin and soft tissue infection of the right hand and forearm.  It is unusual for E. coli to cause soft tissue infection but in her case it is because of the skin tag getting contaminated with her stool.  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  Currently on meropenem and vancomycin.  The latter is being used for  MRSA coverage.  May be able to DC vancomycin in a day or so. As the swelling and erythema is improving at this moment there is no need for repeat imaging. She will need another 7 to 10 days of IV meropenem/ertapenem.  Mantle cell lymphoma.  Received first dose of Bendeka and rituximab on 03/16/2020.  Myelodysplastic syndrome as she is blood transfusion dependent  Thrombocytopenia and anemia due to MDS  Leukocytosis due to mantle cell lymphoma and now infection.  Improving.  Gout on allopurinol  Diabetes mellitus on sliding scale.  Discussed the management with the patient in great detail.

## 2020-03-25 ENCOUNTER — Encounter: Payer: Self-pay | Admitting: Internal Medicine

## 2020-03-25 ENCOUNTER — Inpatient Hospital Stay: Payer: Medicare Other

## 2020-03-25 DIAGNOSIS — B9620 Unspecified Escherichia coli [E. coli] as the cause of diseases classified elsewhere: Secondary | ICD-10-CM | POA: Diagnosis not present

## 2020-03-25 DIAGNOSIS — A499 Bacterial infection, unspecified: Secondary | ICD-10-CM | POA: Diagnosis not present

## 2020-03-25 DIAGNOSIS — L03113 Cellulitis of right upper limb: Secondary | ICD-10-CM | POA: Diagnosis not present

## 2020-03-25 DIAGNOSIS — C9110 Chronic lymphocytic leukemia of B-cell type not having achieved remission: Secondary | ICD-10-CM | POA: Diagnosis not present

## 2020-03-25 DIAGNOSIS — A4151 Sepsis due to Escherichia coli [E. coli]: Principal | ICD-10-CM

## 2020-03-25 DIAGNOSIS — R7881 Bacteremia: Secondary | ICD-10-CM | POA: Diagnosis not present

## 2020-03-25 DIAGNOSIS — E440 Moderate protein-calorie malnutrition: Secondary | ICD-10-CM | POA: Diagnosis not present

## 2020-03-25 DIAGNOSIS — R9089 Other abnormal findings on diagnostic imaging of central nervous system: Secondary | ICD-10-CM | POA: Diagnosis not present

## 2020-03-25 LAB — CBC
HCT: 25.8 % — ABNORMAL LOW (ref 36.0–46.0)
Hemoglobin: 8.3 g/dL — ABNORMAL LOW (ref 12.0–15.0)
MCH: 29.4 pg (ref 26.0–34.0)
MCHC: 32.2 g/dL (ref 30.0–36.0)
MCV: 91.5 fL (ref 80.0–100.0)
Platelets: 26 10*3/uL — CL (ref 150–400)
RBC: 2.82 MIL/uL — ABNORMAL LOW (ref 3.87–5.11)
RDW: 17.4 % — ABNORMAL HIGH (ref 11.5–15.5)
WBC: 33.8 10*3/uL — ABNORMAL HIGH (ref 4.0–10.5)
nRBC: 0 % (ref 0.0–0.2)

## 2020-03-25 LAB — BASIC METABOLIC PANEL WITH GFR
Anion gap: 6 (ref 5–15)
BUN: 12 mg/dL (ref 8–23)
CO2: 23 mmol/L (ref 22–32)
Calcium: 7.9 mg/dL — ABNORMAL LOW (ref 8.9–10.3)
Chloride: 105 mmol/L (ref 98–111)
Creatinine, Ser: 0.43 mg/dL — ABNORMAL LOW (ref 0.44–1.00)
GFR, Estimated: >60 mL/min
Glucose, Bld: 107 mg/dL — ABNORMAL HIGH (ref 70–99)
Potassium: 3.9 mmol/L (ref 3.5–5.1)
Sodium: 134 mmol/L — ABNORMAL LOW (ref 135–145)

## 2020-03-25 LAB — GLUCOSE, CAPILLARY
Glucose-Capillary: 100 mg/dL — ABNORMAL HIGH (ref 70–99)
Glucose-Capillary: 111 mg/dL — ABNORMAL HIGH (ref 70–99)
Glucose-Capillary: 136 mg/dL — ABNORMAL HIGH (ref 70–99)
Glucose-Capillary: 127 mg/dL — ABNORMAL HIGH (ref 70–99)

## 2020-03-25 MED ORDER — ERTAPENEM SODIUM 1 G IJ SOLR
1.0000 g | INTRAMUSCULAR | Status: DC
  Filled 2020-03-25: qty 1

## 2020-03-25 MED ORDER — IOHEXOL 300 MG/ML  SOLN
75.0000 mL | Freq: Once | INTRAMUSCULAR | Status: AC | PRN
  Administered 2020-03-25: 75 mL via INTRAVENOUS

## 2020-03-25 NOTE — TOC Initial Note (Signed)
Transition of Care (TOC) - Initial/Assessment Note    Patient Details  Name: Jacqueline Russo MRN: [id number] Date of Birth: [birth year]  Transition of Care [facility]) CM/SW Contact:    Beverly Sessions, RN Phone Number: 03/25/2020, 3:03 PM  Clinical Narrative:                  Patient admitted from the [facility] of [place] Patient states that she has lived there for 7 years  Per ID patient will require IV antibiotics for an additional 6-9 days PT has assessed patient and recommends SNF  Patient in agreeable to go to the skilled [place] Kim at [facility] notified [place] sent for signature Clinical sent in [place]       Patient Goals and CMS Choice        Expected Discharge Plan and Services                                                Prior Living Arrangements/Services                       Activities of Daily Living Home Assistive Devices/Equipment: Environmental consultant (specify type) ADL Screening (condition at time of admission) Patient's cognitive ability adequate to safely complete daily activities?: Yes Is the patient deaf or have difficulty hearing?: No Does the patient have difficulty seeing, even when wearing glasses/contacts?: Yes Does the patient have difficulty concentrating, remembering, or making decisions?: No Patient able to express need for assistance with ADLs?: Yes Does the patient have difficulty dressing or bathing?: Yes Independently performs ADLs?: Yes (appropriate for developmental age) Does the patient have difficulty walking or climbing stairs?: Yes Weakness of Legs: Both Weakness of Arms/Hands: Right  Permission Sought/Granted                  Emotional Assessment              Admission diagnosis:  Cellulitis [L03.90] Arm swelling [M79.89] CLL (chronic lymphocytic leukemia) ([place]) [C91.10] Cellulitis of right upper extremity [L03.113] Sepsis ([place]) [A41.9] Sepsis, due to unspecified organism, unspecified  whether acute organ dysfunction present [facility]) [A41.9] Patient Active Problem List   Diagnosis Date Noted  . Malnutrition of moderate degree 03/24/2020  . Arm swelling   . Sepsis ([place]) 03/20/2020  . Cellulitis 03/20/2020  . Abnormal CT of brain 03/20/2020  . Mantle cell lymphoma ([place]) 03/05/2020  . Goals of care, counseling/discussion 03/05/2020  . Anxiety 05/17/2019  . Hypokalemia due to inadequate potassium intake 05/17/2019  . Symptomatic anemia 05/17/2019  . Iron overload 05/17/2019  . MDS (myelodysplastic syndrome) with 5q deletion ([place]) 05/16/2019  . Port-A-Cath in place 02/18/2019  . Exertional dyspnea 10/04/2018  . Gouty arthropathy, chronic, without tophi 07/04/2018  . Right renal mass 02/28/2018  . DM type 2 with diabetic mixed hyperlipidemia ([place]) 08/25/2017  . Idiopathic peripheral neuropathy 02/22/2017  . CLL (chronic lymphocytic leukemia) ([place]) 11/16/2015  . Myelodysplastic syndrome ([place]) 11/16/2015  . Vitamin B12 deficient megaloblastic anemia 10/22/2015  . Carotid stenosis, bilateral 07/21/2015  . Diet-controlled type 2 diabetes mellitus ([place]) 04/22/2015  . CKD (chronic kidney disease) stage 3, GFR 30-59 ml/min (HCC) 04/22/2015  . Benign essential hypertension 01/14/2014  . Hyperlipemia 01/14/2014  . Primary osteoarthritis of both hands 01/14/2014   PCP:  [name], MD Pharmacy:   [facility]  [address] [place], [place] - [address] [place] [address] Phone: [phone] Fax: [phone]  [facility] - [place], [place] - [address] [place] [address] Phone: [phone] Fax: [phone]     Social Determinants of Health (SDOH) Interventions    Readmission Risk Interventions No flowsheet data found.

## 2020-03-25 NOTE — Progress Notes (Signed)
Pt complaining of more redness and pain in right arm.  Redness still within marked areas.Will continue to monitor

## 2020-03-25 NOTE — Progress Notes (Signed)
Mobility Specialist - Progress Note   03/25/20 1112  Mobility  Activity Refused mobility  Mobility performed by Mobility specialist    Pt politely declined session at this time. States she is "tired" from PT session earlier and "would like to nap". Requests mob. Specialist return later in the afternoon. Will re-attempt at a later time, if time permits.     Samuella Rasool Mobility Specialist  03/25/20, 11:13 AM

## 2020-03-25 NOTE — Progress Notes (Addendum)
Progress Note    [name]  IYM:[id number] DOB: 22-Dec-1929  DOA: 03/20/2020 PCP: Danella Penton, MD      Brief Narrative:    Medical records reviewed and are as summarized below:  Jacqueline Russo is a 87 y.o. female with medical history significant forCLL that has transformed to stage IV mantle cell lymphoma on chemotherapy , peripheral neuropathy, hyperlipidemia, gout type 2 diabetes, CKD stage III, vitamin B 12 deficiency,MDS (5q-),carotid stenosis, malnutrition, who presented to the hospital with chills, fever (up to 102 F), pain and swelling of the right arm and hand.       Assessment/Plan:   Principal Problem:   Cellulitis Active Problems:   CLL (chronic lymphocytic leukemia) (HCC)   DM type 2 with diabetic mixed hyperlipidemia (HCC)   Sepsis (HCC)   Abnormal CT of brain   Malnutrition of moderate degree   Nutrition Problem: Moderate Malnutrition Etiology: cancer and cancer related treatments  Signs/Symptoms: moderate fat depletion,severe fat depletion,moderate muscle depletion,severe muscle depletion   Body mass index is 18.94 kg/m.     E. coli sepsis secondary to ESBL E. coli bacteremia and right upper extremity cellulitis in immunocompromised patient: Analgesics as needed for pain.  Continue IV vancomycin and meropenem.  ID recommends additional 6 to 9 days of IV meropenem/ertapenem.  Patient was evaluated by general surgeon who recommended conservative management.  No indication for I&D.   History of CLL, now stage IV mantle cell lymphoma, MDS (5q-), leukocytosis, thrombocytopenia, anemia of chronic disease: She started chemotherapy on 03/16/2020.  Continue hydroxyurea and allopurinol.  Follow-up with oncologist, Dr. Sueanne Margarita, as an outpatient.  Monitor CBC.   Abnormal CT head: CT head obtained on 03/19/2020 showed hypodense focus in the anterior right thalamus that may represent age-indeterminate ischemia per radiologist.  Patient does not have any  clinical features to suggest acute stroke at this time.   Debility/generalized weakness: PT recommends discharge to SNF.  Follow-up with social worker to assist with disposition.  Other comorbidities include peripheral neuropathy, hyperlipidemia, gout, type II DM, vitamin B12 deficiency (on monthly B12 injection), carotid artery stenosis, malnutrition, CKD stage IIIa.   Diet Order            Diet Carb Modified Fluid consistency: Thin; Room service appropriate? Yes  Diet effective now                    Consultants:  Infectious disease  General surgeon  Procedures:  None    Medications:   . sodium chloride   Intravenous Once  . allopurinol  300 mg Oral Daily  . Chlorhexidine Gluconate Cloth  6 each Topical Daily  . feeding supplement  1 Container Oral BID BM  . hydrALAZINE  50 mg Oral BID  . hydroxyurea  1,000 mg Oral Q breakfast  . insulin aspart  0-5 Units Subcutaneous QHS  . insulin aspart  0-9 Units Subcutaneous TID WC  . methocarbamol  500 mg Oral TID  . mirtazapine  7.5 mg Oral QHS  . multivitamin with minerals  1 tablet Oral Daily  . sodium chloride flush  10-40 mL Intracatheter Q12H  . sodium chloride flush  3 mL Intravenous Q12H   Continuous Infusions: . meropenem (MERREM) IV 1 g (03/25/20 0407)  . vancomycin 1,000 mg (03/24/20 1713)     Anti-infectives (From admission, onward)   Start     Dose/Rate Route Frequency Ordered Stop   03/23/20 1900  vancomycin (VANCOCIN) IVPB 1000 mg/200 mL premix  1,000 mg 200 mL/hr over 60 Minutes Intravenous Every 24 hours 03/23/20 1808     03/21/20 1800  vancomycin (VANCOREADY) IVPB 500 mg/100 mL  Status:  Discontinued        500 mg 100 mL/hr over 60 Minutes Intravenous Every 24 hours 03/20/20 1945 03/23/20 1808   03/21/20 1800  meropenem (MERREM) 1 g in sodium chloride 0.9 % 100 mL IVPB        1 g 200 mL/hr over 30 Minutes Intravenous Every 12 hours 03/21/20 1302     03/20/20 1545  vancomycin (VANCOCIN)  IVPB 1000 mg/200 mL premix        1,000 mg 200 mL/hr over 60 Minutes Intravenous  Once 03/20/20 1537 03/20/20 2203   03/20/20 1545  meropenem (MERREM) 1 g in sodium chloride 0.9 % 100 mL IVPB  Status:  Discontinued        1 g 200 mL/hr over 30 Minutes Intravenous Every 8 hours 03/20/20 1537 03/21/20 1302             Family Communication/Anticipated D/C date and plan/Code Status   DVT prophylaxis: SCDs Start: 03/20/20 1932     Code Status: Full Code  Family Communication: None Disposition Plan:    Status is: Inpatient  Remains inpatient appropriate because:IV treatments appropriate due to intensity of illness or inability to take PO and Inpatient level of care appropriate due to severity of illness   Dispo:  Patient From: Home  Planned Disposition: Home  Expected discharge date: 04/02/2020  Medically stable for discharge: No            Subjective:   C/o pain in the right upper extremity  Objective:    Vitals:   03/24/20 2338 03/25/20 0413 03/25/20 1203 03/25/20 1426  BP: (!) 151/45 (!) 177/70 (!) 154/52   Pulse: (!) 101 (!) 102 (!) 110   Resp: 20 18 18    Temp: 98.3 F (36.8 C) 99 F (37.2 C) 100.2 F (37.9 C) 100 F (37.8 C)  TempSrc: Oral Oral Oral Oral  SpO2: 90% 93% 94%   Weight:      Height:       No data found.  No intake or output data in the 24 hours ending 03/25/20 1514 Filed Weights   03/20/20 1219  Weight: 44 kg    Exam:  GEN: NAD SKIN: Warm and dry EYES: EOMI ENT: MMM CV: RRR PULM: CTA B ABD: soft, ND, NT, +BS CNS: AAO x 3, non focal EXT: Swelling, tenderness and erythema of right upper extremity   Data Reviewed:   I have personally reviewed following labs and imaging studies:  Labs: Labs show the following:   Basic Metabolic Panel: Recent Labs  Lab 03/19/20 0824 03/20/20 1239 03/21/20 0753 03/22/20 0439 03/23/20 0529 03/24/20 0535 03/25/20 0422  NA 137   < > 132* 133* 132* 133* 134*  K 3.0*   < >  3.1* 2.8* 4.0 3.9 3.9  CL 104   < > 100 99 103 105 105  CO2 20*   < > 21* 19* 21* 20* 23  GLUCOSE 137*   < > 130* 110* 132* 99 107*  BUN 22   < > 17 19 17 13 12   CREATININE 0.65   < > 0.63 0.85 0.46 0.47 0.43*  CALCIUM 8.4*   < > 7.2* 7.2* 7.1* 7.5* 7.9*  MG 1.8  --   --  1.4* 2.4 2.0  --    < > = values in this  interval not displayed.   GFR Estimated Creatinine Clearance: 33.8 mL/min (A) (by C-G formula based on SCr of 0.43 mg/dL (L)). Liver Function Tests: Recent Labs  Lab 03/19/20 0824 03/20/20 1239 03/21/20 0753  AST 33 22 16  ALT 65* 48* 29  ALKPHOS 117 110 73  BILITOT 1.4* 2.1* 1.7*  PROT 6.7 6.8 5.1*  ALBUMIN 4.2 4.1 2.7*   No results for input(s): LIPASE, AMYLASE in the last 168 hours. No results for input(s): AMMONIA in the last 168 hours. Coagulation profile No results for input(s): INR, PROTIME in the last 168 hours.  CBC: Recent Labs  Lab 03/19/20 0824 03/20/20 1239 03/21/20 0753 03/22/20 0439 03/23/20 0529 03/24/20 0535 03/25/20 0422  WBC 108.3* 89.2* 66.1* 69.2* 46.8* 38.3* 33.8*  NEUTROABS 6.0 3.7  --   --   --   --   --   HGB 7.6* 10.4* 8.1* 7.9* 8.2* 8.4* 8.3*  HCT 23.7* 31.5* 24.7* 24.7* 24.8* 25.3* 25.8*  MCV 96.3 90.5 90.8 92.5 90.2 90.0 91.5  PLT 46* 34* 24* 24* 23* 24* 26*   Cardiac Enzymes: Recent Labs  Lab 03/20/20 1239  CKTOTAL 45   BNP (last 3 results) No results for input(s): PROBNP in the last 8760 hours. CBG: Recent Labs  Lab 03/24/20 1223 03/24/20 1624 03/24/20 2054 03/25/20 0811 03/25/20 1200  GLUCAP 122* 112* 93 100* 111*   D-Dimer: No results for input(s): DDIMER in the last 72 hours. Hgb A1c: No results for input(s): HGBA1C in the last 72 hours. Lipid Profile: No results for input(s): CHOL, HDL, LDLCALC, TRIG, CHOLHDL, LDLDIRECT in the last 72 hours. Thyroid function studies: No results for input(s): TSH, T4TOTAL, T3FREE, THYROIDAB in the last 72 hours.  Invalid input(s): FREET3 Anemia work up: No results  for input(s): VITAMINB12, FOLATE, FERRITIN, TIBC, IRON, RETICCTPCT in the last 72 hours. Sepsis Labs: Recent Labs  Lab 03/20/20 1239 03/21/20 0753 03/22/20 0439 03/23/20 0529 03/24/20 0535 03/25/20 0422  WBC 89.2*   < > 69.2* 46.8* 38.3* 33.8*  LATICACIDVEN 1.3  --   --   --   --   --    < > = values in this interval not displayed.    Microbiology Recent Results (from the past 240 hour(s))  Culture, blood (routine x 2)     Status: Abnormal   Collection Time: 03/20/20  5:30 PM   Specimen: BLOOD  Result Value Ref Range Status   Specimen Description   Final    BLOOD PORTA CATH Performed at [facility], [address], [place], [place] [address]    Special Requests   Final    BOTTLES DRAWN AEROBIC AND ANAEROBIC Blood Culture adequate volume Performed at [facility], [address], [place], [place] [address]    Culture  Setup Time   Final    GRAM NEGATIVE RODS IN BOTH AEROBIC AND ANAEROBIC BOTTLES CRITICAL VALUE NOTED.  VALUE IS CONSISTENT WITH PREVIOUSLY REPORTED AND CALLED VALUE. Performed at [facility], [address] [place], [place] [address]    Culture (A)  Final    ESCHERICHIA COLI Confirmed Extended Spectrum Beta-Lactamase Producer (ESBL).  In bloodstream infections from ESBL organisms, carbapenems are preferred over piperacillin/tazobactam. They are shown to have a lower risk of mortality.    Report Status 03/23/2020 FINAL  Final   Organism ID, Bacteria ESCHERICHIA COLI  Final      Susceptibility   Escherichia coli - MIC*    AMPICILLIN >=32 RESISTANT Resistant  CEFAZOLIN >=64 RESISTANT Resistant     CEFEPIME 16 RESISTANT Resistant     CEFTAZIDIME RESISTANT Resistant     CEFTRIAXONE >=64 RESISTANT Resistant     CIPROFLOXACIN >=4 RESISTANT Resistant     GENTAMICIN <=1 SENSITIVE Sensitive     IMIPENEM <=0.25 SENSITIVE Sensitive     TRIMETH/SULFA <=20 SENSITIVE Sensitive     AMPICILLIN/SULBACTAM 16 INTERMEDIATE Intermediate      PIP/TAZO <=4 SENSITIVE Sensitive     * ESCHERICHIA COLI  Culture, blood (routine x 2)     Status: Abnormal   Collection Time: 03/20/20  5:30 PM   Specimen: BLOOD  Result Value Ref Range Status   Specimen Description   Final    BLOOD LEFT ANTECUBITAL Performed at [facility], [address], [place], [place] [address]    Special Requests   Final    BOTTLES DRAWN AEROBIC AND ANAEROBIC Blood Culture adequate volume Performed at [facility], [address] [place], [place] [address]    Culture  Setup Time   Final    Organism ID to follow GRAM NEGATIVE RODS IN BOTH AEROBIC AND ANAEROBIC BOTTLES CRITICAL RESULT CALLED TO, READ BACK BY AND VERIFIED WITH: NATHAN BLEWED 03/21/20 AT [place]. MF Performed at [facility], [address] [place], [place] [address]    Culture (A)  Final    ESCHERICHIA COLI SUSCEPTIBILITIES PERFORMED ON PREVIOUS CULTURE WITHIN THE LAST 5 DAYS. Performed at [facility], [place] [address], [place], [place] [address]    Report Status 03/23/2020 FINAL  Final  SARS CORONAVIRUS 2 (TAT 6-24 HRS) Nasopharyngeal Nasopharyngeal Swab     Status: None   Collection Time: 03/20/20  5:30 PM   Specimen: Nasopharyngeal Swab  Result Value Ref Range Status   SARS Coronavirus 2 NEGATIVE NEGATIVE Final    Comment: (NOTE) SARS-CoV-2 target nucleic acids are NOT DETECTED.  The SARS-CoV-2 RNA is generally detectable in upper and lower respiratory specimens during the acute phase of infection. Negative results do not preclude SARS-CoV-2 infection, do not rule out co-infections with other pathogens, and should not be used as the sole basis for treatment or other patient management decisions. Negative results must be combined with clinical observations, patient history, and epidemiological information. The expected result is Negative.  Fact Sheet for Patients: SugarRoll.be  Fact Sheet for Healthcare  Providers: https://www.woods-mathews.com/  This test is not yet approved or cleared by the [place] FDA and  has been authorized for detection and/or diagnosis of SARS-CoV-2 by FDA under an Emergency Use Authorization (EUA). This EUA will remain  in effect (meaning this test can be used) for the duration of the COVID-19 declaration under Se ction 564(b)(1) of the Act, 21 U.S.C. section 360bbb-3(b)(1), unless the authorization is terminated or revoked sooner.  Performed at [facility], [place] [address], [place], [place] [address]   Blood Culture ID Panel (Reflexed)     Status: Abnormal   Collection Time: 03/20/20  5:30 PM  Result Value Ref Range Status   Enterococcus faecalis NOT DETECTED NOT DETECTED Final   Enterococcus Faecium NOT DETECTED NOT DETECTED Final   Listeria monocytogenes NOT DETECTED NOT DETECTED Final   Staphylococcus species NOT DETECTED NOT DETECTED Final   Staphylococcus aureus (BCID) NOT DETECTED NOT DETECTED Final   Staphylococcus epidermidis NOT DETECTED NOT DETECTED Final   Staphylococcus lugdunensis NOT DETECTED NOT DETECTED Final   Streptococcus species NOT DETECTED NOT DETECTED Final   Streptococcus agalactiae NOT DETECTED NOT DETECTED Final  Streptococcus pneumoniae NOT DETECTED NOT DETECTED Final   Streptococcus pyogenes NOT DETECTED NOT DETECTED Final   A.calcoaceticus-baumannii NOT DETECTED NOT DETECTED Final   Bacteroides fragilis NOT DETECTED NOT DETECTED Final   Enterobacterales DETECTED (A) NOT DETECTED Corrected    Comment:  NATHAN BELUE AT [id number] 03/21/20 MF/SDR Enterobacterales represent a large order of gram negative bacteria, not a single organism. CORRECTED ON 01/01 AT [id number]: PREVIOUSLY REPORTED AS DETECTED Enterobacterales represent a large order of gram negative bacteria, not a single organism. CRITICAL RESULT CALLED TO, READ BACK BY AND VERIFIED WITH:  NATHAN BELUE AT [id number] 03/22/20 MF/SDR    Enterobacter cloacae complex NOT  DETECTED NOT DETECTED Final   Escherichia coli DETECTED (A) NOT DETECTED Corrected    Comment: CRITICAL RESULT CALLED TO, READ BACK BY AND VERIFIED WITH:  NATHAN BELUE AT [id number] 03/21/20 MF/SDR CORRECTED ON 01/01 AT [id number]: PREVIOUSLY REPORTED AS DETECTED CRITICAL RESULT CALLED TO, READ BACK BY AND VERIFIED WITH:  NATHAN BELUE AT [id number] 03/22/20 MF/SDR    Klebsiella aerogenes NOT DETECTED NOT DETECTED Final   Klebsiella oxytoca NOT DETECTED NOT DETECTED Final   Klebsiella pneumoniae NOT DETECTED NOT DETECTED Final   Proteus species NOT DETECTED NOT DETECTED Final   Salmonella species NOT DETECTED NOT DETECTED Final   Serratia marcescens NOT DETECTED NOT DETECTED Final   Haemophilus influenzae NOT DETECTED NOT DETECTED Final   Neisseria meningitidis NOT DETECTED NOT DETECTED Final   Pseudomonas aeruginosa NOT DETECTED NOT DETECTED Final   Stenotrophomonas maltophilia NOT DETECTED NOT DETECTED Final   Candida albicans NOT DETECTED NOT DETECTED Final   Candida auris NOT DETECTED NOT DETECTED Final   Candida glabrata NOT DETECTED NOT DETECTED Final   Candida krusei NOT DETECTED NOT DETECTED Final   Candida parapsilosis NOT DETECTED NOT DETECTED Final   Candida tropicalis NOT DETECTED NOT DETECTED Final   Cryptococcus neoformans/gattii NOT DETECTED NOT DETECTED Final   CTX-M ESBL DETECTED (A) NOT DETECTED Corrected    Comment:  NATHAN BELUE AT [id number] 03/21/20 MF/SDR (NOTE) Extended spectrum beta-lactamase detected. Recommend a carbapenem as initial therapy.   CORRECTED ON 01/01 AT [id number]: PREVIOUSLY REPORTED AS DETECTED CRITICAL RESULT CALLED TO, READ BACK BY AND VERIFIED WITH:  NATHAN BELUE AT [id number] 03/22/20 MF/SDR    Carbapenem resistance IMP NOT DETECTED NOT DETECTED Final   Carbapenem resistance KPC NOT DETECTED NOT DETECTED Final   Carbapenem resistance NDM NOT DETECTED NOT DETECTED Final   Carbapenem resist OXA 48 LIKE NOT DETECTED NOT DETECTED Final   Carbapenem resistance VIM NOT DETECTED NOT  DETECTED Final    Comment: Performed at [facility], [address] [place], [place] [address]  Culture, blood (Routine X 2) w Reflex to ID Panel     Status: None (Preliminary result)   Collection Time: 03/22/20  4:39 AM   Specimen: BLOOD  Result Value Ref Range Status   Specimen Description BLOOD RIGHT WRIST  Final   Special Requests   Final    BOTTLES DRAWN AEROBIC AND ANAEROBIC Blood Culture adequate volume   Culture   Final    NO GROWTH 3 DAYS Performed at [facility], [address] [place], [place] [address]    Report Status PENDING  Incomplete  Culture, blood (Routine X 2) w Reflex to ID Panel     Status: None (Preliminary result)   Collection Time: 03/22/20  4:41 AM   Specimen: BLOOD  Result Value Ref Range Status   Specimen Description BLOOD RIGHT [facility]  Final  Special Requests   Final    BOTTLES DRAWN AEROBIC AND ANAEROBIC Blood Culture adequate volume   Culture   Final    NO GROWTH 3 DAYS Performed at [facility], [address] [place],  [address]    Report Status PENDING  Incomplete    Procedures and diagnostic studies:  ECHOCARDIOGRAM COMPLETE  Result Date: 03/24/2020    ECHOCARDIOGRAM REPORT   Patient Name:   Jacqueline Russo Date of Exam: 03/23/2020 Medical Rec #:  [id number]   Height:       60.0 in Accession #:    [id number]  Weight:       97.0 lb Date of Birth:  [birth year]  BSA:          1.372 m Patient Age:    39 years    BP:           182/80 mmHg Patient Gender: F           HR:           110 bpm. Exam Location:  [facility] Procedure: 2D Echo, Cardiac Doppler and Color Doppler Indications:     Bacteremia R78.81  History:         Patient has no prior history of Echocardiogram examinations.                  Risk Factors:Hypertension.  Sonographer:     Alyse Low Roar Referring Phys:  [id number] Tsosie Billing Diagnosing Phys: Nelva Bush MD IMPRESSIONS  1. Left ventricular ejection fraction, by estimation, is 60 to 65%. The left  ventricle has normal function. The left ventricle has no regional wall motion abnormalities. Left ventricular diastolic parameters are consistent with Grade I diastolic dysfunction (impaired relaxation). Elevated left atrial pressure.  2. Right ventricular systolic function is normal. The right ventricular size is normal. There is moderately elevated pulmonary artery systolic pressure.  3. Left atrial size was moderately dilated.  4. The pericardial effusion is posterior to the left ventricle.  5. The mitral valve is abnormal. Mild mitral valve regurgitation. Mild mitral stenosis. Moderate mitral annular calcification.  6. The aortic valve was not well visualized. There is mild calcification of the aortic valve. There is mild thickening of the aortic valve. Aortic valve regurgitation is not visualized. No aortic stenosis is present.  7. The inferior vena cava is dilated in size with <50% respiratory variability, suggesting right atrial pressure of 15 mmHg. FINDINGS  Left Ventricle: Left ventricular ejection fraction, by estimation, is 60 to 65%. The left ventricle has normal function. The left ventricle has no regional wall motion abnormalities. The left ventricular internal cavity size was normal in size. There is  borderline left ventricular hypertrophy. Left ventricular diastolic parameters are consistent with Grade I diastolic dysfunction (impaired relaxation). Elevated left atrial pressure. Right Ventricle: The right ventricular size is normal. No increase in right ventricular wall thickness. Right ventricular systolic function is normal. There is moderately elevated pulmonary artery systolic pressure. The tricuspid regurgitant velocity is 2.97 m/s, and with an assumed right atrial pressure of 15 mmHg, the estimated right ventricular systolic pressure is 0000000 mmHg. Left Atrium: Left atrial size was moderately dilated. Right Atrium: Right atrial size was normal in size. Pericardium: Trivial pericardial effusion  is present. The pericardial effusion is posterior to the left ventricle. Mitral Valve: The mitral valve is abnormal. There is mild thickening of the mitral valve leaflet(s). There is decreased mobility of the posterior leaflet. Moderate mitral annular calcification. Mild mitral valve regurgitation.  Mild mitral valve stenosis. Tricuspid Valve: The tricuspid valve is not well visualized. Tricuspid valve regurgitation is mild. Aortic Valve: The aortic valve was not well visualized. There is mild calcification of the aortic valve. There is mild thickening of the aortic valve. Aortic valve regurgitation is not visualized. No aortic stenosis is present. Aortic valve peak gradient  measures 12.5 mmHg. Pulmonic Valve: The pulmonic valve was not well visualized. Pulmonic valve regurgitation is not visualized. No evidence of pulmonic stenosis. Aorta: The aortic root and ascending aorta are structurally normal, with no evidence of dilitation. Pulmonary Artery: The pulmonary artery is of normal size. Venous: The inferior vena cava is dilated in size with less than 50% respiratory variability, suggesting right atrial pressure of 15 mmHg. IAS/Shunts: No atrial level shunt detected by color flow Doppler.  LEFT VENTRICLE PLAX 2D LVIDd:         4.10 cm  Diastology LVIDs:         2.80 cm  LV e' medial:    4.03 cm/s LV PW:         0.90 cm  LV E/e' medial:  30.0 LV IVS:        0.99 cm  LV e' lateral:   3.48 cm/s LVOT diam:     1.70 cm  LV E/e' lateral: 34.8 LVOT Area:     2.27 cm  RIGHT VENTRICLE RV Mid diam:    3.40 cm RV S prime:     14.80 cm/s TAPSE (M-mode): 2.1 cm LEFT ATRIUM             Index       RIGHT ATRIUM           Index LA diam:        3.35 cm 2.44 cm/m  RA Area:     16.40 cm LA Vol (A2C):   74.3 ml 54.14 ml/m RA Volume:   41.70 ml  30.38 ml/m LA Vol (A4C):   66.6 ml 48.53 ml/m LA Biplane Vol: 70.7 ml 51.51 ml/m  AORTIC VALVE                PULMONIC VALVE AV Area (Vmax): 1.54 cm    PV Vmax:        0.90 m/s AV Vmax:         177.00 cm/s PV Peak grad:   3.2 mmHg AV Peak Grad:   12.5 mmHg   RVOT Peak grad: 1 mmHg LVOT Vmax:      120.00 cm/s  AORTA Ao Root diam: 2.35 cm Ao Asc diam:  2.50 cm MITRAL VALVE                TRICUSPID VALVE MV Area (PHT): 6.83 cm     TR Peak grad:   35.3 mmHg MV Decel Time: 111 msec     TR Vmax:        297.00 cm/s MV E velocity: 121.00 cm/s MV A velocity: 173.00 cm/s  SHUNTS MV E/A ratio:  0.70         Systemic Diam: 1.70 cm MV A Prime:    16.2 cm/s Nelva Bush MD Electronically signed by Nelva Bush MD Signature Date/Time: 03/24/2020/7:09:57 AM    Final                LOS: 5 days   Jacqueline Russo  [facility]   Pager on [email or web address]. If 7PM-7AM, please contact night-coverage at [email or web address]     03/25/2020, 3:14 PM

## 2020-03-25 NOTE — Progress Notes (Signed)
Physical Therapy Treatment Patient Details Name: Jacqueline Russo MRN: [id number] DOB: [birth year] Today's Date: 03/25/2020    History of Present Illness Pt is an 87 y/o F admitted on 03/20/20 after presenting to ED with c/o RUE pain & swelling likely 2/2 cellulitis. PMH: CLL with chemo, peripheral neuropathy, HLD, gout, DM2, CKD stage 3, vitamin B12 deficiency, MDC, carotid stenosis, malnutrition, R total shoulder arthroplasty    PT Comments    Pt was pleasant and motivated to participate during the session.  Pt moved with very slow, deliberate movements throughout the session to protect her RUE from pain and required frequent short therapeutic rest breaks.  Pt required min assist for all functional tasks and was only able to ambulate a max of 12 feet before requiring to return to sitting.  Pt did report that walking with her R hand resting on the walker was more comfortable than walking with her arm hanging at her side but even with both hands on the walker she needed occasional assist to advance the walker.  Pt will benefit from PT services in a SNF setting upon discharge to safely address deficits listed in patient problem list for decreased caregiver assistance and eventual return to PLOF.     Follow Up Recommendations  Supervision for mobility/OOB;SNF     Equipment Recommendations  None recommended by PT    Recommendations for Other Services       Precautions / Restrictions Precautions Precautions: Fall Restrictions Weight Bearing Restrictions: No    Mobility  Bed Mobility Overal bed mobility: Needs Assistance Bed Mobility: Supine to Sit;Sit to Supine     Supine to sit: Min assist Sit to supine: Min assist   General bed mobility comments: Min A for trunk to full upright position during sup to sit and for BLE assist into bed during sit to supine  Transfers Overall transfer level: Needs assistance Equipment used: Rolling walker (2 wheeled) Transfers: Sit to/from Stand Sit  to Stand: Min assist;From elevated surface         General transfer comment: Min verbal cues for hand placement and min A to come to full standing position  Ambulation/Gait Ambulation/Gait assistance: Min assist Gait Distance (Feet): 12 Feet Assistive device: Rolling walker (2 wheeled) Gait Pattern/deviations: Step-through pattern;Decreased step length - right;Decreased step length - left Gait velocity: decreased   General Gait Details: Very slow cadence with short B step length but steady without LOB with min A to manage the RW at times; max amb distance 12 feet this session   Stairs             Wheelchair Mobility    Modified Rankin (Stroke Patients Only)       Balance Overall balance assessment: Needs assistance Sitting-balance support: Feet supported Sitting balance-Leahy Scale: Good     Standing balance support: Bilateral upper extremity supported;During functional activity Standing balance-Leahy Scale: Fair                              Cognition Arousal/Alertness: Awake/alert Behavior During Therapy: WFL for tasks assessed/performed Overall Cognitive Status: Within Functional Limits for tasks assessed                                        Exercises Total Joint Exercises Ankle Circles/Pumps: Strengthening;Both;5 reps;10 reps (with manual resistance) Quad Sets: Strengthening;Both;5 reps;10 reps Gluteal Sets: Strengthening;Both;5  reps;10 reps Heel Slides: Strengthening;Both;10 reps Hip ABduction/ADduction: Strengthening;Both;10 reps (with manual resistance) Straight Leg Raises: Strengthening;Both;10 reps Long Arc Quad: AROM;Strengthening;Both;5 reps;10 reps Knee Flexion: AROM;Strengthening;Both;5 reps;10 reps Marching in Standing: AROM;Strengthening;Both;5 reps;Standing Bridges: Strengthening;Both;5 reps;10 reps Other Exercises Other Exercises: HEP education for BLE APs, QS, LAQs, and GS x 10 each every 1-2 hours daily and  BLE SLR, hip abd, and bridges x 10 each 2-3x/day as tolerated Other Exercises: RUE positioning education    General Comments        Pertinent Vitals/Pain Pain Assessment: 0-10 Pain Score: 7  Pain Location: RUE Pain Descriptors / Indicators: Aching;Sore Pain Intervention(s): Premedicated before session;Monitored during session;Repositioned    Home Living                      Prior Function            PT Goals (current goals can now be found in the care plan section) Progress towards PT goals: Progressing toward goals    Frequency    Min 2X/week      PT Plan Discharge plan needs to be updated    Co-evaluation              AM-PAC PT "6 Clicks" Mobility   Outcome Measure  Help needed turning from your back to your side while in a flat bed without using bedrails?: A Little Help needed moving from lying on your back to sitting on the side of a flat bed without using bedrails?: A Little Help needed moving to and from a bed to a chair (including a wheelchair)?: A Little Help needed standing up from a chair using your arms (e.g., wheelchair or bedside chair)?: A Little Help needed to walk in hospital room?: A Little Help needed climbing 3-5 steps with a railing? : A Lot 6 Click Score: 17    End of Session Equipment Utilized During Treatment: Gait belt Activity Tolerance: Patient tolerated treatment well Patient left: in bed;with bed alarm set;with call bell/phone within reach;Other (comment) (Pt declined up in chair) Nurse Communication: Mobility status PT Visit Diagnosis: Unsteadiness on feet (R26.81);Muscle weakness (generalized) (M62.81);Difficulty in walking, not elsewhere classified (R26.2);Pain     Time: [id number] PT Time Calculation (min) (ACUTE ONLY): 41 min  Charges:  $Gait Training: 8-22 mins $Therapeutic Exercise: 8-22 mins $Therapeutic Activity: 8-22 mins                     D. Scott Kaiser Belluomini PT, DPT 03/25/20, 11:33 AM

## 2020-03-25 NOTE — NC FL2 (Signed)
[place] MEDICAID FL2 LEVEL OF CARE SCREENING TOOL     IDENTIFICATION  Patient Name: Jacqueline Russo Birthdate: 20-Oct-1929 Sex: female Admission Date (Current Location): 03/20/2020  [facility] and [place] Number:  Chiropodist and Address:         Provider Number: [id number]  Attending Physician Name and Address:  Lurene Shadow, MD  Relative Name and Phone Number:       Current Level of Care: Hospital Recommended Level of Care: Skilled Nursing Facility Prior Approval Number:    Date Approved/Denied:   PASRR Number: [id number]  Discharge Plan: SNF    Current Diagnoses: Patient Active Problem List   Diagnosis Date Noted  . Malnutrition of moderate degree 03/24/2020  . Arm swelling   . Sepsis (HCC) 03/20/2020  . Cellulitis 03/20/2020  . Abnormal CT of brain 03/20/2020  . Mantle cell lymphoma (HCC) 03/05/2020  . Goals of care, counseling/discussion 03/05/2020  . Anxiety 05/17/2019  . Hypokalemia due to inadequate potassium intake 05/17/2019  . Symptomatic anemia 05/17/2019  . Iron overload 05/17/2019  . MDS (myelodysplastic syndrome) with 5q deletion (HCC) 05/16/2019  . Port-A-Cath in place 02/18/2019  . Exertional dyspnea 10/04/2018  . Gouty arthropathy, chronic, without tophi 07/04/2018  . Right renal mass 02/28/2018  . DM type 2 with diabetic mixed hyperlipidemia (HCC) 08/25/2017  . Idiopathic peripheral neuropathy 02/22/2017  . CLL (chronic lymphocytic leukemia) (HCC) 11/16/2015  . Myelodysplastic syndrome (HCC) 11/16/2015  . Vitamin B12 deficient megaloblastic anemia 10/22/2015  . Carotid stenosis, bilateral 07/21/2015  . Diet-controlled type 2 diabetes mellitus (HCC) 04/22/2015  . CKD (chronic kidney disease) stage 3, GFR 30-59 ml/min (HCC) 04/22/2015  . Benign essential hypertension 01/14/2014  . Hyperlipemia 01/14/2014  . Primary osteoarthritis of both hands 01/14/2014    Orientation RESPIRATION BLADDER Height & Weight     Self,Time  Normal  Continent Weight: 44 kg Height:  5' (152.4 cm)  BEHAVIORAL SYMPTOMS/MOOD NEUROLOGICAL BOWEL NUTRITION STATUS      Continent Diet (Carb modified)  AMBULATORY STATUS COMMUNICATION OF NEEDS Skin   Limited Assist Verbally Other (Comment) (Right arm cellulitis)                       Personal Care Assistance Level of Assistance              Functional Limitations Info             SPECIAL CARE FACTORS FREQUENCY  PT (By licensed PT),OT (By licensed OT) (IV antibiotics for 7-10 days)                    Contractures Contractures Info: Not present    Additional Factors Info  Code Status,Allergies,Isolation Precautions Code Status Info: Full Allergies Info: Ciprofloxacin     Isolation Precautions Info: Contact ESBL     Current Medications (03/25/2020):  This is the current hospital active medication list Current Facility-Administered Medications  Medication Dose Route Frequency Provider Last Rate Last Admin  . 0.9 %  sodium chloride infusion (Manually program via Guardrails IV Fluids)   Intravenous Once Esaw Grandchild A, DO      . acetaminophen (TYLENOL) tablet 650 mg  650 mg Oral Q6H PRN Rayne Du, MD   650 mg at 03/25/20 1437   Or  . acetaminophen (TYLENOL) suppository 650 mg  650 mg Rectal Q6H PRN Rayne Du, MD      . allopurinol (ZYLOPRIM) tablet 300 mg  300 mg Oral Daily Rayne Du, MD  300 mg at 03/25/20 0953  . ALPRAZolam Prudy Feeler) tablet 0.25 mg  0.25 mg Oral Daily PRN Rayne Du, MD   0.25 mg at 03/25/20 1208  . Chlorhexidine Gluconate Cloth 2 % PADS 6 each  6 each Topical Daily Darlin Priestly, MD   6 each at 03/25/20 [phone]  . diphenhydrAMINE (BENADRYL) capsule 25 mg  25 mg Oral QHS PRN Otelia Sergeant, RPH   25 mg at 03/24/20 2058  . feeding supplement (BOOST / RESOURCE BREEZE) liquid 1 Container  1 Container Oral BID BM Esaw Grandchild A, DO   1 Container at 03/24/20 1329  . hydrALAZINE (APRESOLINE) tablet 50 mg  50 mg Oral BID Rayne Du, MD   50  mg at 03/25/20 0953  . hydroxyurea (HYDREA) capsule 1,000 mg  1,000 mg Oral Q breakfast Rayne Du, MD   1,000 mg at 03/25/20 0953  . insulin aspart (novoLOG) injection 0-5 Units  0-5 Units Subcutaneous QHS Rayne Du, MD      . insulin aspart (novoLOG) injection 0-9 Units  0-9 Units Subcutaneous TID WC Rayne Du, MD   1 Units at 03/24/20 1300  . meropenem (MERREM) 1 g in sodium chloride 0.9 % 100 mL IVPB  1 g Intravenous Q12H Darlin Priestly, MD 200 mL/hr at 03/25/20 0407 1 g at 03/25/20 0407  . methocarbamol (ROBAXIN) tablet 500 mg  500 mg Oral TID Esaw Grandchild A, DO   500 mg at 03/25/20 0953  . mirtazapine (REMERON) tablet 7.5 mg  7.5 mg Oral QHS Rayne Du, MD   7.5 mg at 03/24/20 2058  . multivitamin with minerals tablet 1 tablet  1 tablet Oral Daily Esaw Grandchild A, DO   1 tablet at 03/25/20 0953  . ondansetron (ZOFRAN) tablet 8 mg  8 mg Oral BID PRN Rayne Du, MD      . oxyCODONE (Oxy IR/ROXICODONE) immediate release tablet 5 mg  5 mg Oral Q4H PRN Rayne Du, MD   5 mg at 03/25/20 1437  . sodium chloride flush (NS) 0.9 % injection 10-40 mL  10-40 mL Intracatheter Q12H Jeralyn Ruths, MD   10 mL at 03/24/20 2101  . sodium chloride flush (NS) 0.9 % injection 10-40 mL  10-40 mL Intracatheter PRN Jeralyn Ruths, MD      . sodium chloride flush (NS) 0.9 % injection 3 mL  3 mL Intravenous Q12H Rayne Du, MD   3 mL at 03/24/20 2101  . vancomycin (VANCOCIN) IVPB 1000 mg/200 mL premix  1,000 mg Intravenous Q24H Sharen Hones, RPH 200 mL/hr at 03/24/20 1713 1,000 mg at 03/24/20 1713   Facility-Administered Medications Ordered in Other Encounters  Medication Dose Route Frequency Provider Last Rate Last Admin  . heparin lock flush 100 unit/mL  500 Units Intravenous Once Jeralyn Ruths, MD      . sodium chloride flush (NS) 0.9 % injection 10 mL  10 mL Intravenous PRN Jeralyn Ruths, MD   10 mL at 09/05/18 1025     Discharge Medications: Please see  discharge summary for a list of discharge medications.  Relevant Imaging Results:  Relevant Lab Results:   Additional Information ss [id number]  Chapman Fitch, RN

## 2020-03-25 NOTE — Progress Notes (Signed)
ID Pt still has pain rt arm though movt is better No fever O/e awake and alert, in distress when moving or touching arm Patient Vitals for the past 24 hrs:  BP Temp Temp src Pulse Resp SpO2  03/25/20 1958 (!) 142/72 97.6 F (36.4 C) Oral 95 18 93 %  03/25/20 1426 - 100 F (37.8 C) Oral - - -  03/25/20 1203 (!) 154/52 100.2 F (37.9 C) Oral (!) 110 18 94 %  03/25/20 0413 (!) 177/70 99 F (37.2 C) Oral (!) 102 18 93 %  03/24/20 2338 (!) 151/45 98.3 F (36.8 C) Oral (!) 101 20 90 %   Chest b/l air entry [id number] Abd soft Cns non focal Rt upper extremity- erythema, swelling and tenderness localized to forearm and above cubital fossa   Labs. CBC Latest Ref Rng & Units 03/25/2020 03/24/2020 03/23/2020  WBC 4.0 - 10.5 K/uL 33.8(H) 38.3(H) 46.8(H)  Hemoglobin 12.0 - 15.0 g/dL 8.3(L) 8.4(L) 8.2(L)  Hematocrit 36.0 - 46.0 % 25.8(L) 25.3(L) 24.8(L)  Platelets 150 - 400 K/uL 26(LL) 24(LL) 23(LL)   CMP Latest Ref Rng & Units 03/25/2020 03/24/2020 03/23/2020  Glucose 70 - 99 mg/dL 409(B) 99 353(G)  BUN 8 - 23 mg/dL 12 13 17   Creatinine 0.44 - 1.00 mg/dL ) 9.92(E 2.68  Sodium 135 - 145 mmol/L 134(L) 133(L) 132(L)  Potassium 3.5 - 5.1 mmol/L 3.9 3.9 4.0  Chloride 98 - 111 mmol/L 105 105 103  CO2 22 - 32 mmol/L 23 20(L) 21(L)  Calcium 8.9 - 10.3 mg/dL 7.9(L) 7.5(L) 7.1(L)  Total Protein 6.5 - 8.1 g/dL - - -  Total Bilirubin 0.3 - 1.2 mg/dL - - -  Alkaline Phos 38 - 126 U/L - - -  AST 15 - 41 U/L - - -  ALT 0 - 44 U/L - - -    Impression/recommendation ESBL E. coli bacteremia with skin and soft tissue infection of the right hand and forearm and upper arm.  Currently on meropenem and vancomycin.  The latter is being used for MRSA coverage.  Will DC vancomycin today.  We will repeat a CT imaging of the arm to look for any abscess.  She will need another 8 days of intravenous ertapenem.  If there is an abscess will need incision and drainage.  Mantle cell lymphoma.  Received first dose of  bendamustine and rituximab on 03/16/2020. Leukocytosis due to mantle cell lymphoma and now infection improving   Myelodysplastic syndrome.  She is transfusion dependent.  She is on hydroxyurea. Gout on allopurinol  Diabetes mellitus on sliding scale  Discussed the management with the patient and her sister and the care team.

## 2020-03-25 NOTE — Progress Notes (Signed)
Mobility Specialist - Progress Note   03/25/20 1452  Mobility  Activity Refused mobility  Mobility performed by Mobility specialist    Re-attempted session at this time. Pt refused. States she has already been OOB to use the [facility] and feels "worn out". Will re-attempt tomorrow.    Lennard Capek Mobility Specialist  03/25/20, 2:53 PM

## 2020-03-26 ENCOUNTER — Ambulatory Visit: Payer: Medicare Other

## 2020-03-26 ENCOUNTER — Encounter
Admission: RE | Admit: 2020-03-26 | Discharge: 2020-03-26 | Disposition: A | Discharge status: Home / Self Care | Payer: Medicare Other | Source: Ambulatory Visit | Attending: Internal Medicine | Admitting: Internal Medicine

## 2020-03-26 DIAGNOSIS — A4151 Sepsis due to Escherichia coli [E. coli]: Secondary | ICD-10-CM | POA: Diagnosis not present

## 2020-03-26 DIAGNOSIS — C9110 Chronic lymphocytic leukemia of B-cell type not having achieved remission: Secondary | ICD-10-CM | POA: Diagnosis not present

## 2020-03-26 DIAGNOSIS — L03113 Cellulitis of right upper limb: Secondary | ICD-10-CM | POA: Diagnosis not present

## 2020-03-26 DIAGNOSIS — B9620 Unspecified Escherichia coli [E. coli] as the cause of diseases classified elsewhere: Secondary | ICD-10-CM | POA: Diagnosis present

## 2020-03-26 DIAGNOSIS — E440 Moderate protein-calorie malnutrition: Secondary | ICD-10-CM | POA: Diagnosis not present

## 2020-03-26 DIAGNOSIS — R7881 Bacteremia: Secondary | ICD-10-CM | POA: Diagnosis present

## 2020-03-26 LAB — GLUCOSE, CAPILLARY
Glucose-Capillary: 108 mg/dL — ABNORMAL HIGH (ref 70–99)
Glucose-Capillary: 109 mg/dL — ABNORMAL HIGH (ref 70–99)
Glucose-Capillary: 141 mg/dL — ABNORMAL HIGH (ref 70–99)

## 2020-03-26 LAB — RESP PANEL BY RT-PCR (FLU A&B, COVID) ARPGX2
Influenza A by PCR: NEGATIVE
Influenza B by PCR: NEGATIVE
SARS Coronavirus 2 by RT PCR: NEGATIVE

## 2020-03-26 LAB — CREATININE, SERUM
Creatinine, Ser: 0.38 mg/dL — ABNORMAL LOW (ref 0.44–1.00)
GFR, Estimated: >60 mL/min (ref >60)

## 2020-03-26 MED ORDER — ERTAPENEM IV (FOR PTA / DISCHARGE USE ONLY): 1.0000 g | INTRAVENOUS | 0 refills | Status: DC

## 2020-03-26 MED ORDER — FLEET ENEMA 7-19 GM/118ML RE ENEM
1.0000 | ENEMA | Freq: Once | RECTAL | Status: AC
  Administered 2020-03-26: 1 via RECTAL

## 2020-03-26 MED ORDER — AMLODIPINE BESYLATE 5 MG PO TABS: 5.0000 mg | ORAL_TABLET | Freq: Every day | ORAL | Status: AC

## 2020-03-26 MED ORDER — SODIUM CHLORIDE 0.9 % IV SOLN
1.0000 g | INTRAVENOUS | Status: DC
  Administered 2020-03-26: 1000 mg via INTRAVENOUS
  Filled 2020-03-26 (×2): qty 1

## 2020-03-26 MED ORDER — AMLODIPINE BESYLATE 5 MG PO TABS
5.0000 mg | ORAL_TABLET | Freq: Every day | ORAL | Status: DC
  Administered 2020-03-26: 5 mg via ORAL
  Filled 2020-03-26: qty 1

## 2020-03-26 MED ORDER — ALPRAZOLAM 0.25 MG PO TABS: 0.2500 mg | ORAL_TABLET | Freq: Every day | ORAL | 0 refills | Status: AC | PRN

## 2020-03-26 MED ORDER — HYDROCODONE-ACETAMINOPHEN 5-325 MG PO TABS: 1.0000 | ORAL_TABLET | Freq: Three times a day (TID) | ORAL | 0 refills | Status: AC | PRN

## 2020-03-26 MED ORDER — ACETAMINOPHEN 325 MG PO TABS: 650.0000 mg | ORAL_TABLET | Freq: Four times a day (QID) | ORAL | Status: AC | PRN

## 2020-03-26 MED ORDER — HYDRALAZINE HCL 50 MG PO TABS
50.0000 mg | ORAL_TABLET | Freq: Three times a day (TID) | ORAL | Status: DC
  Administered 2020-03-26 (×2): 50 mg via ORAL
  Filled 2020-03-26 (×2): qty 1

## 2020-03-26 MED ORDER — ERTAPENEM IV (FOR PTA / DISCHARGE USE ONLY): 1.0000 g | INTRAVENOUS | 0 refills | Status: AC

## 2020-03-26 MED ORDER — SODIUM CHLORIDE 0.9 % IV SOLN: 1.0000 g | INTRAVENOUS | Status: DC

## 2020-03-26 NOTE — Progress Notes (Signed)
Pt Bp is elevated at 189/84. HR-99. Rechecked after few minutes 168/75. Oncall hospitalist informed. Awaiting for reply.

## 2020-03-26 NOTE — Discharge Summary (Addendum)
Physician Discharge Summary  Bryla Burek BTY:[id number] DOB: Jul 27, 1929 DOA: 03/20/2020  PCP: [name], MD  Admit date: 03/20/2020 Discharge date: 03/26/2020  Discharge disposition: Skilled nursing facility   Recommendations for Outpatient Follow-Up:   Follow-up with physician at the nursing home within 3 days of discharge.   Discharge Diagnosis:   Principal Problem:   Sepsis due to Escherichia coli (E. coli) (HCC) Active Problems:   CLL (chronic lymphocytic leukemia) (HCC)   DM type 2 with diabetic mixed hyperlipidemia (HCC)   Cellulitis   Abnormal CT of brain   Malnutrition of moderate degree   E coli bacteremia    Discharge Condition: Stable.  Diet recommendation:  Diet Order            Diet - low sodium heart healthy           Diet Carb Modified Fluid consistency: Thin; Room service appropriate? Yes  Diet effective now                   Code Status: Full Code     Hospital Course:   Ms. Kandee Escalante is a 87 y.o. female with medical history significant forCLL that has transformed to stage IV mantle cell lymphoma on chemotherapy , peripheral neuropathy, hyperlipidemia, gout type 2 diabetes, CKD stage III, vitamin B 12 deficiency,MDS (5q-),carotid stenosis, malnutrition, who presented to the hospital with chills, fever (up to 102 F), pain and swelling of the right arm and hand.   She was admitted to the hospital for sepsis secondary to right upper extremity cellulitis.  She was treated with empiric IV antibiotics and analgesics.  CT of the right upper extremity did not show any evidence of abscess, bony involvement or necrotizing fasciitis.  Blood culture showed E. Coli. ID specialist and general surgeon were consulted to assist with management.  ID recommended IV ertapenem 1 g daily for 8 days of discharge.  She was evaluated by PT and OT recommended further rehabilitation at a skilled nursing facility.  Her condition has improved and she is deemed  stable for discharge to SNF today.  Of note, her blood pressure was significantly elevated during admission.  Amlodipine was prescribed for adequate BP control.  Discharge plan was discussed with the patient and her sister, Lynelle Smoke, at the bedside.    Medical Consultants:    ID specialist  General surgeon   Discharge Exam:    Vitals:   03/26/20 7741 03/26/20 0631 03/26/20 0745 03/26/20 1204  BP: (!) 191/79 (!) 157/65 (!) 177/73 (!) 160/62  Pulse: 96 95 (!) 108 (!) 110  Resp:   20 16  Temp:   98.4 F (36.9 C) 98.5 F (36.9 C)  TempSrc:   Oral Oral  SpO2:   97% 95%  Weight:      Height:         GEN: NAD SKIN: Warm and dry EYES: EOMI ENT: MMM CV: RRR PULM: CTA B ABD: soft, ND, NT, +BS CNS: AAO x 3, non focal EXT: Swelling, tenderness and erythema of right upper extremity is slowly improving.   The results of significant diagnostics from this hospitalization (including imaging, microbiology, ancillary and laboratory) are listed below for reference.     Procedures and Diagnostic Studies:   ECHOCARDIOGRAM COMPLETE  Result Date: 03/24/2020    ECHOCARDIOGRAM REPORT   Patient Name:   ALVAH GILDER Date of Exam: 03/23/2020 Medical Rec #:  [id number]   Height:       60.0 in Accession #:  [id number]  Weight:       97.0 lb Date of Birth:  01-18-1930  BSA:          1.372 m Patient Age:    65 years    BP:           182/80 mmHg Patient Gender: F           HR:           110 bpm. Exam Location:  [facility] Procedure: 2D Echo, Cardiac Doppler and Color Doppler Indications:     Bacteremia R78.81  History:         Patient has no prior history of Echocardiogram examinations.                  Risk Factors:Hypertension.  Sonographer:     Alyse Low Roar Referring Phys:  [id number] Tsosie Billing Diagnosing Phys: Nelva Bush MD IMPRESSIONS  1. Left ventricular ejection fraction, by estimation, is 60 to 65%. The left ventricle has normal function. The left ventricle has no regional wall motion  abnormalities. Left ventricular diastolic parameters are consistent with Grade I diastolic dysfunction (impaired relaxation). Elevated left atrial pressure.  2. Right ventricular systolic function is normal. The right ventricular size is normal. There is moderately elevated pulmonary artery systolic pressure.  3. Left atrial size was moderately dilated.  4. The pericardial effusion is posterior to the left ventricle.  5. The mitral valve is abnormal. Mild mitral valve regurgitation. Mild mitral stenosis. Moderate mitral annular calcification.  6. The aortic valve was not well visualized. There is mild calcification of the aortic valve. There is mild thickening of the aortic valve. Aortic valve regurgitation is not visualized. No aortic stenosis is present.  7. The inferior vena cava is dilated in size with <50% respiratory variability, suggesting right atrial pressure of 15 mmHg. FINDINGS  Left Ventricle: Left ventricular ejection fraction, by estimation, is 60 to 65%. The left ventricle has normal function. The left ventricle has no regional wall motion abnormalities. The left ventricular internal cavity size was normal in size. There is  borderline left ventricular hypertrophy. Left ventricular diastolic parameters are consistent with Grade I diastolic dysfunction (impaired relaxation). Elevated left atrial pressure. Right Ventricle: The right ventricular size is normal. No increase in right ventricular wall thickness. Right ventricular systolic function is normal. There is moderately elevated pulmonary artery systolic pressure. The tricuspid regurgitant velocity is 2.97 m/s, and with an assumed right atrial pressure of 15 mmHg, the estimated right ventricular systolic pressure is 76.2 mmHg. Left Atrium: Left atrial size was moderately dilated. Right Atrium: Right atrial size was normal in size. Pericardium: Trivial pericardial effusion is present. The pericardial effusion is posterior to the left ventricle.  Mitral Valve: The mitral valve is abnormal. There is mild thickening of the mitral valve leaflet(s). There is decreased mobility of the posterior leaflet. Moderate mitral annular calcification. Mild mitral valve regurgitation. Mild mitral valve stenosis. Tricuspid Valve: The tricuspid valve is not well visualized. Tricuspid valve regurgitation is mild. Aortic Valve: The aortic valve was not well visualized. There is mild calcification of the aortic valve. There is mild thickening of the aortic valve. Aortic valve regurgitation is not visualized. No aortic stenosis is present. Aortic valve peak gradient  measures 12.5 mmHg. Pulmonic Valve: The pulmonic valve was not well visualized. Pulmonic valve regurgitation is not visualized. No evidence of pulmonic stenosis. Aorta: The aortic root and ascending aorta are structurally normal, with no evidence of dilitation. Pulmonary Artery: The pulmonary artery  is of normal size. Venous: The inferior vena cava is dilated in size with less than 50% respiratory variability, suggesting right atrial pressure of 15 mmHg. IAS/Shunts: No atrial level shunt detected by color flow Doppler.  LEFT VENTRICLE PLAX 2D LVIDd:         4.10 cm  Diastology LVIDs:         2.80 cm  LV e' medial:    4.03 cm/s LV PW:         0.90 cm  LV E/e' medial:  30.0 LV IVS:        0.99 cm  LV e' lateral:   3.48 cm/s LVOT diam:     1.70 cm  LV E/e' lateral: 34.8 LVOT Area:     2.27 cm  RIGHT VENTRICLE RV Mid diam:    3.40 cm RV S prime:     14.80 cm/s TAPSE (M-mode): 2.1 cm LEFT ATRIUM             Index       RIGHT ATRIUM           Index LA diam:        3.35 cm 2.44 cm/m  RA Area:     16.40 cm LA Vol (A2C):   74.3 ml 54.14 ml/m RA Volume:   41.70 ml  30.38 ml/m LA Vol (A4C):   66.6 ml 48.53 ml/m LA Biplane Vol: 70.7 ml 51.51 ml/m  AORTIC VALVE                PULMONIC VALVE AV Area (Vmax): 1.54 cm    PV Vmax:        0.90 m/s AV Vmax:        177.00 cm/s PV Peak grad:   3.2 mmHg AV Peak Grad:   12.5 mmHg    RVOT Peak grad: 1 mmHg LVOT Vmax:      120.00 cm/s  AORTA Ao Root diam: 2.35 cm Ao Asc diam:  2.50 cm MITRAL VALVE                TRICUSPID VALVE MV Area (PHT): 6.83 cm     TR Peak grad:   35.3 mmHg MV Decel Time: 111 msec     TR Vmax:        297.00 cm/s MV E velocity: 121.00 cm/s MV A velocity: 173.00 cm/s  SHUNTS MV E/A ratio:  0.70         Systemic Diam: 1.70 cm MV A Prime:    16.2 cm/s Nelva Bush MD Electronically signed by Nelva Bush MD Signature Date/Time: 03/24/2020/7:09:57 AM    Final      Labs:   Basic Metabolic Panel: Recent Labs  Lab 03/21/20 0753 03/22/20 0439 03/23/20 0529 03/24/20 0535 03/25/20 0422 03/26/20 0357  NA 132* 133* 132* 133* 134*  --   K 3.1* 2.8* 4.0 3.9 3.9  --   CL 100 99 103 105 105  --   CO2 21* 19* 21* 20* 23  --   GLUCOSE 130* 110* 132* 99 107*  --   BUN _0 --   CREATININE 0.63 0.85 0.46 0.47 0.43* 0.38*  CALCIUM 7.2* 7.2* 7.1* 7.5* 7.9*  --   MG  --  1.4* 2.4 2.0  --   --    GFR Estimated Creatinine Clearance: 33.8 mL/min (A) (by C-G formula based on SCr of 0.38 mg/dL (L)). Liver Function Tests: Recent Labs  Lab 03/20/20 1239 03/21/20 0753  AST  22 16  ALT 48* 29  ALKPHOS 110 73  BILITOT 2.1* 1.7*  PROT 6.8 5.1*  ALBUMIN 4.1 2.7*   No results for input(s): LIPASE, AMYLASE in the last 168 hours. No results for input(s): AMMONIA in the last 168 hours. Coagulation profile No results for input(s): INR, PROTIME in the last 168 hours.  CBC: Recent Labs  Lab 03/20/20 1239 03/21/20 0753 03/22/20 0439 03/23/20 0529 03/24/20 0535 03/25/20 0422  WBC 89.2* 66.1* 69.2* 46.8* 38.3* 33.8*  NEUTROABS 3.7  --   --   --   --   --   HGB 10.4* 8.1* 7.9* 8.2* 8.4* 8.3*  HCT 31.5* 24.7* 24.7* 24.8* 25.3* 25.8*  MCV 90.5 90.8 92.5 90.2 90.0 91.5  PLT 34* 24* 24* 23* 24* 26*   Cardiac Enzymes: Recent Labs  Lab 03/20/20 1239  CKTOTAL 45   BNP: Invalid input(s): POCBNP CBG: Recent Labs  Lab 03/25/20 1200  03/25/20 1617 03/25/20 2059 03/26/20 0853 03/26/20 1201  GLUCAP 111* 136* 127* 108* 109*   D-Dimer No results for input(s): DDIMER in the last 72 hours. Hgb A1c No results for input(s): HGBA1C in the last 72 hours. Lipid Profile No results for input(s): CHOL, HDL, LDLCALC, TRIG, CHOLHDL, LDLDIRECT in the last 72 hours. Thyroid function studies No results for input(s): TSH, T4TOTAL, T3FREE, THYROIDAB in the last 72 hours.  Invalid input(s): FREET3 Anemia work up No results for input(s): VITAMINB12, FOLATE, FERRITIN, TIBC, IRON, RETICCTPCT in the last 72 hours. Microbiology Recent Results (from the past 240 hour(s))  Culture, blood (routine x 2)     Status: Abnormal   Collection Time: 03/20/20  5:30 PM   Specimen: BLOOD  Result Value Ref Range Status   Specimen Description   Final    BLOOD PORTA CATH Performed at [facility], [address], [place], [place] [address]    Special Requests   Final    BOTTLES DRAWN AEROBIC AND ANAEROBIC Blood Culture adequate volume Performed at [facility], [address], [place], [place] [address]    Culture  Setup Time   Final    GRAM NEGATIVE RODS IN BOTH AEROBIC AND ANAEROBIC BOTTLES CRITICAL VALUE NOTED.  VALUE IS CONSISTENT WITH PREVIOUSLY REPORTED AND CALLED VALUE. Performed at [facility], [place] [address], [place], [place] [address]    Culture (A)  Final    ESCHERICHIA COLI Confirmed Extended Spectrum Beta-Lactamase Producer (ESBL).  In bloodstream infections from ESBL organisms, carbapenems are preferred over piperacillin/tazobactam. They are shown to have a lower risk of mortality.    Report Status 03/23/2020 FINAL  Final   Organism ID, Bacteria ESCHERICHIA COLI  Final      Susceptibility   Escherichia coli - MIC*    AMPICILLIN >=32 RESISTANT Resistant     CEFAZOLIN >=64 RESISTANT Resistant     CEFEPIME 16 RESISTANT Resistant     CEFTAZIDIME RESISTANT Resistant     CEFTRIAXONE >=64 RESISTANT  Resistant     CIPROFLOXACIN >=4 RESISTANT Resistant     GENTAMICIN <=1 SENSITIVE Sensitive     IMIPENEM <=0.25 SENSITIVE Sensitive     TRIMETH/SULFA <=20 SENSITIVE Sensitive     AMPICILLIN/SULBACTAM 16 INTERMEDIATE Intermediate     PIP/TAZO <=4 SENSITIVE Sensitive     * ESCHERICHIA COLI  Culture, blood (routine x 2)     Status: Abnormal   Collection Time: 03/20/20  5:30 PM   Specimen: BLOOD  Result Value Ref Range Status   Specimen Description   Final  BLOOD LEFT ANTECUBITAL Performed at [facility], [address], [place], [place] [address]    Special Requests   Final    BOTTLES DRAWN AEROBIC AND ANAEROBIC Blood Culture adequate volume Performed at [facility], [address] [place], [place] [address]    Culture  Setup Time   Final    Organism ID to follow GRAM NEGATIVE RODS IN BOTH AEROBIC AND ANAEROBIC BOTTLES CRITICAL RESULT CALLED TO, READ BACK BY AND VERIFIED WITH: NATHAN BLEWED 03/21/20 AT [place]. MF Performed at [facility], [address] [place], [place] [address]    Culture (A)  Final    ESCHERICHIA COLI SUSCEPTIBILITIES PERFORMED ON PREVIOUS CULTURE WITHIN THE LAST 5 DAYS. Performed at [facility], [place] [address] [place], [place] [address]    Report Status 03/23/2020 FINAL  Final  SARS CORONAVIRUS 2 (TAT 6-24 HRS) Nasopharyngeal Nasopharyngeal Swab     Status: None   Collection Time: 03/20/20  5:30 PM   Specimen: Nasopharyngeal Swab  Result Value Ref Range Status   SARS Coronavirus 2 NEGATIVE NEGATIVE Final    Comment: (NOTE) SARS-CoV-2 target nucleic acids are NOT DETECTED.  The SARS-CoV-2 RNA is generally detectable in upper and lower respiratory specimens during the acute phase of infection. Negative results do not preclude SARS-CoV-2 infection, do not rule out co-infections with other pathogens, and should not be used as the sole basis for treatment or other patient management decisions. Negative results must be  combined with clinical observations, patient history, and epidemiological information. The expected result is Negative.  Fact Sheet for Patients: SugarRoll.be  Fact Sheet for Healthcare Providers: https://www.woods-mathews.com/  This test is not yet approved or cleared by the [place] FDA and  has been authorized for detection and/or diagnosis of SARS-CoV-2 by FDA under an Emergency Use Authorization (EUA). This EUA will remain  in effect (meaning this test can be used) for the duration of the COVID-19 declaration under Se ction 564(b)(1) of the Act, 21 U.S.C. section 360bbb-3(b)(1), unless the authorization is terminated or revoked sooner.  Performed at [facility], [place] [address], [place], [place] [address]   Blood Culture ID Panel (Reflexed)     Status: Abnormal   Collection Time: 03/20/20  5:30 PM  Result Value Ref Range Status   Enterococcus faecalis NOT DETECTED NOT DETECTED Final   Enterococcus Faecium NOT DETECTED NOT DETECTED Final   Listeria monocytogenes NOT DETECTED NOT DETECTED Final   Staphylococcus species NOT DETECTED NOT DETECTED Final   Staphylococcus aureus (BCID) NOT DETECTED NOT DETECTED Final   Staphylococcus epidermidis NOT DETECTED NOT DETECTED Final   Staphylococcus lugdunensis NOT DETECTED NOT DETECTED Final   Streptococcus species NOT DETECTED NOT DETECTED Final   Streptococcus agalactiae NOT DETECTED NOT DETECTED Final   Streptococcus pneumoniae NOT DETECTED NOT DETECTED Final   Streptococcus pyogenes NOT DETECTED NOT DETECTED Final   A.calcoaceticus-baumannii NOT DETECTED NOT DETECTED Final   Bacteroides fragilis NOT DETECTED NOT DETECTED Final   Enterobacterales DETECTED (A) NOT DETECTED Corrected    Comment:  NATHAN BELUE AT 6767 03/21/20 MF/SDR Enterobacterales represent a large order of gram negative bacteria, not a single organism. CORRECTED ON 01/01 AT 2094: PREVIOUSLY REPORTED AS DETECTED  Enterobacterales represent a large order of gram negative bacteria, not a single organism. CRITICAL RESULT CALLED TO, READ BACK BY AND VERIFIED WITH:  NATHAN BELUE AT 7096 03/22/20 MF/SDR    Enterobacter cloacae complex NOT DETECTED NOT DETECTED Final   Escherichia coli DETECTED (  A) NOT DETECTED Corrected    Comment: CRITICAL RESULT CALLED TO, READ BACK BY AND VERIFIED WITH:  NATHAN BELUE AT 7829 03/21/20 MF/SDR CORRECTED ON 01/01 AT 5621: PREVIOUSLY REPORTED AS DETECTED CRITICAL RESULT CALLED TO, READ BACK BY AND VERIFIED WITH:  NATHAN BELUE AT 3086 03/22/20 MF/SDR    Klebsiella aerogenes NOT DETECTED NOT DETECTED Final   Klebsiella oxytoca NOT DETECTED NOT DETECTED Final   Klebsiella pneumoniae NOT DETECTED NOT DETECTED Final   Proteus species NOT DETECTED NOT DETECTED Final   Salmonella species NOT DETECTED NOT DETECTED Final   Serratia marcescens NOT DETECTED NOT DETECTED Final   Haemophilus influenzae NOT DETECTED NOT DETECTED Final   Neisseria meningitidis NOT DETECTED NOT DETECTED Final   Pseudomonas aeruginosa NOT DETECTED NOT DETECTED Final   Stenotrophomonas maltophilia NOT DETECTED NOT DETECTED Final   Candida albicans NOT DETECTED NOT DETECTED Final   Candida auris NOT DETECTED NOT DETECTED Final   Candida glabrata NOT DETECTED NOT DETECTED Final   Candida krusei NOT DETECTED NOT DETECTED Final   Candida parapsilosis NOT DETECTED NOT DETECTED Final   Candida tropicalis NOT DETECTED NOT DETECTED Final   Cryptococcus neoformans/gattii NOT DETECTED NOT DETECTED Final   CTX-M ESBL DETECTED (A) NOT DETECTED Corrected    Comment:  NATHAN BELUE AT 5784 03/21/20 MF/SDR (NOTE) Extended spectrum beta-lactamase detected. Recommend a carbapenem as initial therapy.   CORRECTED ON 01/01 AT 6962: PREVIOUSLY REPORTED AS DETECTED CRITICAL RESULT CALLED TO, READ BACK BY AND VERIFIED WITH:  NATHAN BELUE AT 9528 03/22/20 MF/SDR    Carbapenem resistance IMP NOT DETECTED NOT DETECTED Final    Carbapenem resistance KPC NOT DETECTED NOT DETECTED Final   Carbapenem resistance NDM NOT DETECTED NOT DETECTED Final   Carbapenem resist OXA 48 LIKE NOT DETECTED NOT DETECTED Final   Carbapenem resistance VIM NOT DETECTED NOT DETECTED Final    Comment: Performed at [facility], [address] [place], [place] [address]  Culture, blood (Routine X 2) w Reflex to ID Panel     Status: None (Preliminary result)   Collection Time: 03/22/20  4:39 AM   Specimen: BLOOD  Result Value Ref Range Status   Specimen Description BLOOD RIGHT WRIST  Final   Special Requests   Final    BOTTLES DRAWN AEROBIC AND ANAEROBIC Blood Culture adequate volume   Culture   Final    NO GROWTH 4 DAYS Performed at [facility], [address] [place], [place] [address]    Report Status PENDING  Incomplete  Culture, blood (Routine X 2) w Reflex to ID Panel     Status: None (Preliminary result)   Collection Time: 03/22/20  4:41 AM   Specimen: BLOOD  Result Value Ref Range Status   Specimen Description BLOOD RIGHT [facility]  Final   Special Requests   Final    BOTTLES DRAWN AEROBIC AND ANAEROBIC Blood Culture adequate volume   Culture   Final    NO GROWTH 4 DAYS Performed at [facility], [address], [place], [place] [address]    Report Status PENDING  Incomplete     Discharge Instructions:   Discharge Instructions    [facility] pharmacist to adjust dose for Vancomycin, Aminoglycosides and other anti-infective therapies as requested by physician.   Complete by: As directed    [facility] to provide Cath Flo $Remove'2mg'kAzdUVR$    Complete by: As directed    Administer for PICC line occlusion and as ordered by physician for other access  device issues.   Anaphylaxis Kit: Provided to treat any anaphylactic reaction to the medication being provided to the patient if First Dose or when requested by physician   Complete by: As directed    Epinephrine 1mg /ml vial / amp: Administer  0.3mg  (0.60ml) subcutaneously once for moderate to severe anaphylaxis, nurse to call physician and pharmacy when reaction occurs and call 911 if needed for immediate care   Diphenhydramine 50mg /ml IV vial: Administer 25-50mg  IV/IM PRN for first dose reaction, rash, itching, mild reaction, nurse to call physician and pharmacy when reaction occurs   Sodium Chloride 0.9% NS 548ml IV: Administer if needed for hypovolemic blood pressure drop or as ordered by physician after call to physician with anaphylactic reaction   Change dressing on IV access line weekly and PRN   Complete by: As directed    Diet - low sodium heart healthy   Complete by: As directed    Discharge instructions   Complete by: As directed    Apply ACE wrap to right upper extremity to help reduce swelling. Follow-up with physician on the next normal in 3 days of discharge.   Flush IV access with Sodium Chloride 0.9% and Heparin 10 units/ml or 100 units/ml   Complete by: As directed    Home infusion instructions - [facility]   Complete by: As directed    Instructions: Flush IV access with Sodium Chloride 0.9% and Heparin 10units/ml or 100units/ml   Change dressing on IV access line: Weekly and PRN   Instructions Cath Flo 2mg : Administer for PICC Line occlusion and as ordered by physician for other access device   [facility] pharmacist to adjust dose for: Vancomycin, Aminoglycosides and other anti-infective therapies as requested by physician   Increase activity slowly   Complete by: As directed    Method of administration may be changed at the discretion of home infusion pharmacist based upon assessment of the patient and/or caregiver's ability to self-administer the medication ordered   Complete by: As directed    No wound care   Complete by: As directed    Outpatient Parenteral Antibiotic Therapy Information Antibiotic: Ertapenem (Invanz) IVPB; Indications for use: right upper extrenity cellulitis; End  Date: 04/04/2020   Complete by: As directed    Antibiotic: Ertapenem Colbert Ewing) IVPB   Indications for use: right upper extrenity cellulitis   End Date: 04/04/2020     Allergies as of 03/26/2020      Reactions   Ciprofloxacin Nausea Only      Medication List    STOP taking these medications   metoCLOPramide 5 MG tablet Commonly known as: REGLAN   ondansetron 8 MG tablet Commonly known as: Zofran   prochlorperazine 10 MG tablet Commonly known as: COMPAZINE     TAKE these medications   acetaminophen 325 MG tablet Commonly known as: TYLENOL Take 2 tablets (650 mg total) by mouth every 6 (six) hours as needed for mild pain (or Fever >/= 101).   allopurinol 300 MG tablet Commonly known as: ZYLOPRIM Take 1 tablet (300 mg total) by mouth daily.   ALPRAZolam 0.25 MG tablet Commonly known as: XANAX Take 1 tablet (0.25 mg total) by mouth daily as needed for anxiety.   amLODipine 5 MG tablet Commonly known as: NORVASC Take 1 tablet (5 mg total) by mouth daily. Start taking on: March 27, 2020   aspirin 81 MG tablet Take 81 mg by mouth daily.   cyanocobalamin 1000 MCG/ML injection Commonly known as: (VITAMIN B-12) Inject  into the muscle.   diphenhydrAMINE 25 MG tablet Commonly known as: SOMINEX Take 25 mg by mouth at bedtime.   diphenoxylate-atropine 2.5-0.025 MG tablet Commonly known as: LOMOTIL Take 2 tablets by mouth every 6 (six) hours as needed for diarrhea or loose stools.   ertapenem 1,000 mg in sodium chloride 0.9 % 100 mL Inject 1,000 mg into the vein daily for 8 days. Start taking on: March 27, 2020   etodolac 400 MG tablet Commonly known as: LODINE Take 400 mg by mouth daily.   hydrALAZINE 50 MG tablet Commonly known as: APRESOLINE Take 50 mg by mouth 2 (two) times daily.   HYDROcodone-acetaminophen 5-325 MG tablet Commonly known as: NORCO/VICODIN Take 1-2 tablets by mouth every 8 (eight) hours as needed for moderate pain or severe pain. What  changed: when to take this   hydroxyurea 500 MG capsule Commonly known as: HYDREA Take 2 capsules (1,000 mg total) by mouth daily. May take with food to minimize GI side effects.   lidocaine-prilocaine cream Commonly known as: EMLA Apply 1 application topically as needed. Apply to port 1-2 hours prior to appointment. Cover with plastic wrap.   loperamide 2 MG tablet Commonly known as: IMODIUM A-D Take 1 tablet (2 mg total) by mouth 4 (four) times daily as needed for diarrhea or loose stools.   mirtazapine 7.5 MG tablet Commonly known as: REMERON Take 1 tablet (7.5 mg total) by mouth at bedtime.   potassium chloride SA 20 MEQ tablet Commonly known as: KLOR-CON Take 1 tablet (20 mEq total) by mouth 2 (two) times daily.   tiZANidine 2 MG tablet Commonly known as: ZANAFLEX Take 1 tablet (2 mg total) by mouth at bedtime as needed for muscle spasms.            Discharge Care Instructions  (From admission, onward)         Start     Ordered   03/26/20 0000  Change dressing on IV access line weekly and PRN  (Home infusion instructions - [facility] )        03/26/20 1237            Time coordinating discharge: 33 minutes  Signed:  Cayde Held  [facility] 03/26/2020, 12:47 PM   Pager on [email or web address]. If 7PM-7AM, please contact night-coverage at [email or web address]

## 2020-03-26 NOTE — Care Management Important Message (Signed)
Important Message  Patient Details  Name: Jacqueline Russo MRN: [id number] Date of Birth: 08/26/1929   Medicare Important Message Given:  Yes - Important Message mailed due to current National Emergency  Reviewed Medicare IM with patient via room phone due to isolation status.  Copy of Medicare IM placed in mail to patient's home address on file.     Johnell Comings 03/26/2020, 1:22 PM

## 2020-03-26 NOTE — TOC Transition Note (Signed)
Transition of Care [facility]) - CM/SW Discharge Note   Patient Details  Name: Jacqueline Russo MRN: [id number] Date of Birth: December 26, 1929  Transition of Care [facility]) CM/SW Contact:  Chapman Fitch, RN Phone Number: 03/26/2020, 2:48 PM   Clinical Narrative:    Patient to discharge to [facility] today Sister in room .  And updated DC summary sent in HUB to [place] [facility] [place].   EMS packet printed.  Signed script for pain medication on chart, and bedside RN will place in discharge packet.  Bedside RN to call report  1415 - notified by bedside RN that patient has not had BM since 12/30 Per DON at facility they are unable to accept the patient until patient has a BM.  Admissions confirms that patient can come any time tonight as long as she has a BM before.   Bedside RN, MD and toc leadership aware.  If patient has BM bedside RN to call report and EMS   Final next level of care: Skilled Nursing Facility Barriers to Discharge: No Barriers Identified   Patient Goals and CMS Choice        Discharge Placement                Patient to be transferred to facility by: EMS Name of family member notified: sister Patient and family notified of of transfer: 03/26/20  Discharge Plan and Services                                     Social Determinants of Health (SDOH) Interventions     Readmission Risk Interventions No flowsheet data found.

## 2020-03-26 NOTE — Progress Notes (Signed)
Mobility Specialist - Progress Note   03/26/20 1129  Mobility  Activity Ambulated in room;Transferred to/from [facility]  Level of Assistance Contact guard assist, steadying assist (needs assist w/ managing RW)  Assistive Device Front wheel walker  Distance Ambulated (ft) 12 ft (5' from bed to [facility], 7' from [facility] to recliner)  Mobility Response Tolerated well  Mobility performed by Mobility specialist  $Mobility charge 1 Mobility    Pt laying in bed upon arrival. Pt agreed to session. Pt's sister entered room. Pt able to get to EOB mod. Independently. Pt S2S SBA. Pt transferred from bed to [facility] C[facility]. Pt had urinal output. Pt performed toilet hygiene independently. Pt ambulated 7' from [facility] to recliner. During transfer and ambulation, pt required assist w/ handling walker d/t cellulitis in RUE. Having "3/10" pain in RUE. No LOB noted. Overall, pt tolerated session well. Pt pleasant and motivated t/o session. Pt left sitting on recliner w/ sister present in room. All needs placed in reach.      Muath Hallam Mobility Specialist  03/26/20, 11:35 AM

## 2020-03-26 NOTE — Progress Notes (Signed)
PHARMACY CONSULT NOTE FOR:  OUTPATIENT  PARENTERAL ANTIBIOTIC THERAPY (OPAT)  Indication: ESBL E. Coli bacteremia with upper extremity skin/soft tissue infection Regimen: Ertapenem 1gm IV q24h  End date: 04/04/2020  IV antibiotic discharge orders are pended. To discharging provider:  please sign these orders via discharge navigator,  Select New Orders & click on the button choice - Manage This Unsigned Work.     Thank you for allowing pharmacy to be a part of this patient's care.  Juliette Alcide, PharmD, BCPS.   Work Cell: [phone] 03/26/2020 1:52 PM

## 2020-03-26 NOTE — Progress Notes (Signed)
03/26/20  Patient had repeat CT scan of right upper extremity yesterday.  I have personally viewed the images.  There is no abscess or fluid to drain.  Recommend ACE wrap to her right arm to help with her edema.  Henrene Dodge, MD

## 2020-03-27 LAB — CULTURE, BLOOD (ROUTINE X 2)
Culture: NO GROWTH
Culture: NO GROWTH
Special Requests: ADEQUATE
Special Requests: ADEQUATE

## 2020-03-30 ENCOUNTER — Inpatient Hospital Stay: Payer: Medicare Other

## 2020-03-30 ENCOUNTER — Inpatient Hospital Stay: Payer: Medicare Other | Admitting: Oncology

## 2020-04-03 ENCOUNTER — Other Ambulatory Visit
Admission: RE | Admit: 2020-04-03 | Discharge: 2020-04-03 | Disposition: A | Discharge status: Home / Self Care | Payer: Medicare Other | Source: Skilled Nursing Facility | Attending: Internal Medicine | Admitting: Internal Medicine

## 2020-04-03 ENCOUNTER — Telehealth: Payer: Self-pay | Admitting: Oncology

## 2020-04-03 ENCOUNTER — Other Ambulatory Visit: Payer: Self-pay

## 2020-04-03 ENCOUNTER — Telehealth: Payer: Self-pay | Admitting: *Deleted

## 2020-04-03 ENCOUNTER — Inpatient Hospital Stay
Admission: EM | Admit: 2020-04-03 | Discharge: 2020-04-10 | DRG: 813 | Disposition: A | Discharge status: Skilled Nursing Facility | Payer: Medicare Other | Source: Skilled Nursing Facility | Attending: Internal Medicine | Admitting: Internal Medicine

## 2020-04-03 ENCOUNTER — Emergency Department: Payer: Medicare Other

## 2020-04-03 ENCOUNTER — Encounter: Payer: Self-pay | Admitting: Emergency Medicine

## 2020-04-03 ENCOUNTER — Observation Stay: Payer: Medicare Other

## 2020-04-03 DIAGNOSIS — Y848 Other medical procedures as the cause of abnormal reaction of the patient, or of later complication, without mention of misadventure at the time of the procedure: Secondary | ICD-10-CM | POA: Diagnosis not present

## 2020-04-03 DIAGNOSIS — D696 Thrombocytopenia, unspecified: Secondary | ICD-10-CM | POA: Diagnosis not present

## 2020-04-03 DIAGNOSIS — R000 Tachycardia, unspecified: Secondary | ICD-10-CM | POA: Diagnosis not present

## 2020-04-03 DIAGNOSIS — C8310 Mantle cell lymphoma, unspecified site: Secondary | ICD-10-CM | POA: Diagnosis present

## 2020-04-03 DIAGNOSIS — Z87891 Personal history of nicotine dependence: Secondary | ICD-10-CM

## 2020-04-03 DIAGNOSIS — Z79899 Other long term (current) drug therapy: Secondary | ICD-10-CM

## 2020-04-03 DIAGNOSIS — E785 Hyperlipidemia, unspecified: Secondary | ICD-10-CM | POA: Diagnosis present

## 2020-04-03 DIAGNOSIS — Z856 Personal history of leukemia: Secondary | ICD-10-CM

## 2020-04-03 DIAGNOSIS — D649 Anemia, unspecified: Secondary | ICD-10-CM

## 2020-04-03 DIAGNOSIS — T8092XA Unspecified transfusion reaction, initial encounter: Secondary | ICD-10-CM

## 2020-04-03 DIAGNOSIS — Z8543 Personal history of malignant neoplasm of ovary: Secondary | ICD-10-CM

## 2020-04-03 DIAGNOSIS — Z515 Encounter for palliative care: Secondary | ICD-10-CM

## 2020-04-03 DIAGNOSIS — B9620 Unspecified Escherichia coli [E. coli] as the cause of diseases classified elsewhere: Secondary | ICD-10-CM

## 2020-04-03 DIAGNOSIS — D630 Anemia in neoplastic disease: Secondary | ICD-10-CM | POA: Diagnosis present

## 2020-04-03 DIAGNOSIS — Z888 Allergy status to other drugs, medicaments and biological substances status: Secondary | ICD-10-CM

## 2020-04-03 DIAGNOSIS — R161 Splenomegaly, not elsewhere classified: Secondary | ICD-10-CM | POA: Diagnosis present

## 2020-04-03 DIAGNOSIS — Z9049 Acquired absence of other specified parts of digestive tract: Secondary | ICD-10-CM

## 2020-04-03 DIAGNOSIS — Z90710 Acquired absence of both cervix and uterus: Secondary | ICD-10-CM

## 2020-04-03 DIAGNOSIS — R509 Fever, unspecified: Secondary | ICD-10-CM | POA: Diagnosis not present

## 2020-04-03 DIAGNOSIS — R519 Headache, unspecified: Secondary | ICD-10-CM | POA: Diagnosis present

## 2020-04-03 DIAGNOSIS — I10 Essential (primary) hypertension: Secondary | ICD-10-CM | POA: Diagnosis present

## 2020-04-03 DIAGNOSIS — E440 Moderate protein-calorie malnutrition: Secondary | ICD-10-CM

## 2020-04-03 DIAGNOSIS — R451 Restlessness and agitation: Secondary | ICD-10-CM | POA: Diagnosis not present

## 2020-04-03 DIAGNOSIS — Z96611 Presence of right artificial shoulder joint: Secondary | ICD-10-CM | POA: Diagnosis present

## 2020-04-03 DIAGNOSIS — J90 Pleural effusion, not elsewhere classified: Secondary | ICD-10-CM | POA: Diagnosis present

## 2020-04-03 DIAGNOSIS — F419 Anxiety disorder, unspecified: Secondary | ICD-10-CM | POA: Diagnosis present

## 2020-04-03 DIAGNOSIS — A498 Other bacterial infections of unspecified site: Secondary | ICD-10-CM | POA: Diagnosis present

## 2020-04-03 DIAGNOSIS — R7881 Bacteremia: Secondary | ICD-10-CM | POA: Diagnosis present

## 2020-04-03 DIAGNOSIS — C9200 Acute myeloblastic leukemia, not having achieved remission: Secondary | ICD-10-CM | POA: Insufficient documentation

## 2020-04-03 DIAGNOSIS — Z20822 Contact with and (suspected) exposure to covid-19: Secondary | ICD-10-CM | POA: Diagnosis present

## 2020-04-03 DIAGNOSIS — Z7982 Long term (current) use of aspirin: Secondary | ICD-10-CM

## 2020-04-03 DIAGNOSIS — Z66 Do not resuscitate: Secondary | ICD-10-CM | POA: Diagnosis present

## 2020-04-03 DIAGNOSIS — Z881 Allergy status to other antibiotic agents status: Secondary | ICD-10-CM

## 2020-04-03 LAB — CBC WITH DIFFERENTIAL/PLATELET
Abs Immature Granulocytes: 0.06 10*3/uL (ref 0.00–0.07)
Basophils Absolute: 0.2 10*3/uL — ABNORMAL HIGH (ref 0.0–0.1)
Basophils Relative: 2 %
Eosinophils Absolute: 0.1 10*3/uL (ref 0.0–0.5)
Eosinophils Relative: 1 %
HCT: 22.2 % — ABNORMAL LOW (ref 36.0–46.0)
Hemoglobin: 7.2 g/dL — ABNORMAL LOW (ref 12.0–15.0)
Immature Granulocytes: 1 %
Lymphocytes Relative: 87 %
Lymphs Abs: 10.3 10*3/uL — ABNORMAL HIGH (ref 0.7–4.0)
MCH: 30.3 pg (ref 26.0–34.0)
MCHC: 32.4 g/dL (ref 30.0–36.0)
MCV: 93.3 fL (ref 80.0–100.0)
Monocytes Absolute: 0.2 10*3/uL (ref 0.1–1.0)
Monocytes Relative: 2 %
Neutro Abs: 0.9 10*3/uL — ABNORMAL LOW (ref 1.7–7.7)
Neutrophils Relative %: 7 %
Platelets: 8 10*3/uL — CL (ref 150–400)
RBC: 2.38 MIL/uL — ABNORMAL LOW (ref 3.87–5.11)
RDW: 16.3 % — ABNORMAL HIGH (ref 11.5–15.5)
Smear Review: DECREASED
WBC: 11.7 10*3/uL — ABNORMAL HIGH (ref 4.0–10.5)
nRBC: 0 % (ref 0.0–0.2)

## 2020-04-03 LAB — URINALYSIS, COMPLETE (UACMP) WITH MICROSCOPIC
Bacteria, UA: NONE SEEN
Bilirubin Urine: NEGATIVE
Glucose, UA: NEGATIVE mg/dL
Hgb urine dipstick: NEGATIVE
Ketones, ur: NEGATIVE mg/dL
Nitrite: NEGATIVE
Protein, ur: NEGATIVE mg/dL
Specific Gravity, Urine: 1.016 (ref 1.005–1.030)
pH: 5 (ref 5.0–8.0)

## 2020-04-03 MED ORDER — MIRTAZAPINE 15 MG PO TABS
7.5000 mg | ORAL_TABLET | Freq: Every day | ORAL | Status: DC
  Administered 2020-04-04 – 2020-04-09 (×6): 7.5 mg via ORAL
  Filled 2020-04-03 (×6): qty 1

## 2020-04-03 MED ORDER — ONDANSETRON HCL 4 MG PO TABS: 4.0000 mg | ORAL_TABLET | Freq: Four times a day (QID) | ORAL | Status: DC | PRN

## 2020-04-03 MED ORDER — DIPHENHYDRAMINE HCL 50 MG/ML IJ SOLN
25.0000 mg | Freq: Once | INTRAMUSCULAR | Status: AC
  Administered 2020-04-03: 25 mg via INTRAVENOUS
  Filled 2020-04-03: qty 1

## 2020-04-03 MED ORDER — ALLOPURINOL 300 MG PO TABS
300.0000 mg | ORAL_TABLET | Freq: Every day | ORAL | Status: DC
  Administered 2020-04-04 – 2020-04-06 (×3): 300 mg via ORAL
  Filled 2020-04-03 (×4): qty 1

## 2020-04-03 MED ORDER — ACETAMINOPHEN 500 MG PO TABS
1000.0000 mg | ORAL_TABLET | Freq: Once | ORAL | Status: AC
  Administered 2020-04-03: 1000 mg via ORAL
  Filled 2020-04-03: qty 2

## 2020-04-03 MED ORDER — ONDANSETRON HCL 4 MG/2ML IJ SOLN: 4.0000 mg | Freq: Four times a day (QID) | INTRAMUSCULAR | Status: DC | PRN

## 2020-04-03 MED ORDER — ASPIRIN EC 81 MG PO TBEC
81.0000 mg | DELAYED_RELEASE_TABLET | Freq: Every day | ORAL | Status: DC
Start: 2020-04-04 — End: 2020-04-09
  Administered 2020-04-04 – 2020-04-09 (×5): 81 mg via ORAL
  Filled 2020-04-03 (×5): qty 1

## 2020-04-03 MED ORDER — AMLODIPINE BESYLATE 5 MG PO TABS
5.0000 mg | ORAL_TABLET | Freq: Every day | ORAL | Status: DC
Start: 2020-04-04 — End: 2020-04-10
  Administered 2020-04-04 – 2020-04-10 (×6): 5 mg via ORAL
  Filled 2020-04-03 (×7): qty 1

## 2020-04-03 MED ORDER — SODIUM CHLORIDE 0.9 % IV BOLUS: 1000.0000 mL | Freq: Once | INTRAVENOUS | Status: DC

## 2020-04-03 MED ORDER — HYDROXYUREA 500 MG PO CAPS
1000.0000 mg | ORAL_CAPSULE | Freq: Every day | ORAL | Status: DC
  Administered 2020-04-05: 1000 mg via ORAL
  Filled 2020-04-03 (×3): qty 2

## 2020-04-03 MED ORDER — ACETAMINOPHEN 325 MG PO TABS
650.0000 mg | ORAL_TABLET | Freq: Four times a day (QID) | ORAL | Status: DC | PRN
  Administered 2020-04-04 – 2020-04-10 (×4): 650 mg via ORAL
  Filled 2020-04-03 (×4): qty 2

## 2020-04-03 MED ORDER — HYDROCODONE-ACETAMINOPHEN 7.5-325 MG PO TABS
1.0000 | ORAL_TABLET | Freq: Three times a day (TID) | ORAL | Status: AC | PRN
  Administered 2020-04-04 – 2020-04-09 (×2): 1 via ORAL
  Filled 2020-04-03 (×2): qty 1

## 2020-04-03 MED ORDER — ALPRAZOLAM 0.25 MG PO TABS
0.2500 mg | ORAL_TABLET | Freq: Every day | ORAL | Status: DC | PRN
  Administered 2020-04-04 – 2020-04-09 (×4): 0.25 mg via ORAL
  Filled 2020-04-03 (×4): qty 1

## 2020-04-03 MED ORDER — DIPHENHYDRAMINE HCL 50 MG/ML IJ SOLN
50.0000 mg | Freq: Four times a day (QID) | INTRAMUSCULAR | Status: DC | PRN
  Administered 2020-04-04: 50 mg via INTRAVENOUS
  Filled 2020-04-03: qty 1

## 2020-04-03 MED ORDER — SODIUM CHLORIDE 0.9 % IV SOLN
10.0000 mL/h | Freq: Once | INTRAVENOUS | Status: AC
  Administered 2020-04-03: 10 mL/h via INTRAVENOUS

## 2020-04-03 MED ORDER — DIPHENHYDRAMINE HCL 50 MG/ML IJ SOLN
12.5000 mg | Freq: Once | INTRAMUSCULAR | Status: AC
  Administered 2020-04-03: 12.5 mg via INTRAVENOUS
  Filled 2020-04-03: qty 1

## 2020-04-03 NOTE — Telephone Encounter (Signed)
Per Dr Grayland Ormond, patient needs to go to ER, but does not think she will go. So he wants her to come in Monday. I called and spoke with [place] and she will try to get patient to go and call us back if not

## 2020-04-03 NOTE — ED Notes (Signed)
Blood consent discussed with pt, all questions answered.

## 2020-04-03 NOTE — ED Notes (Signed)
Pt assisted to use bedpan.  Pt urinated and had BM at this time.  Pt cleaned and brief placed back on.

## 2020-04-03 NOTE — Telephone Encounter (Signed)
I discussed with the ER physician Dr. Joni Fears about patient's case.  Patient has thrombocytopenia, likely due to chemotherapy/disease progression.  Patient will receive 1 unit of platelet transfusion in the ED and she prefers to go home after that. I will send this message to Dr. Cecille Aver to arrange patient to do a follow-up lab encounter next week at cancer center.

## 2020-04-03 NOTE — H&P (Addendum)
History and Physical   Jacqueline Russo [id number] DOB: March 15, 1930 DOA: 04/03/2020  PCP: [name], MD  Outpatient Specialists: Dr. Earlie Server, Oncology Patient coming from: oncology clinic  I have personally briefly reviewed patient's old medical records in [place].  Chief Concern: Low platelets  HPI: Jacqueline Russo is a 87 y.o. female with medical history significant for mantle cell lymphoma stage IV, history of MDS, CLL, presented to the emergency department from oncology clinic for chief concerns of low platelets.  She was told to have 1 unit of platelets and then sent home, however approximately 1 hour into the transfusion, patient developed fever and sinus tachycardia.  At bedside, patient denies chest pain, shortness of breath, vomiting, abdominal pain.   She endorses feeling nervous and jittery. She endorses feeling weak  Social history: She lives at [place]: Constitutional: no weight change, + fever ENT/Mouth: no sore throat, no rhinorrhea Eyes: no eye pain, no vision changes Cardiovascular: no chest pain, no dyspnea,  no edema, no palpitations Respiratory: no cough, no sputum, no wheezing Gastrointestinal: no nausea, no vomiting, no diarrhea, no constipation Genitourinary: no urinary incontinence, no dysuria, no hematuria Musculoskeletal: no arthralgias, no myalgias Skin: no skin lesions, no pruritus, Neuro: + weakness, no loss of consciousness, no syncope Psych: no anxiety, no depression, + decrease appetite Heme/Lymph: no bruising, no bleeding  ED Course: Discussed with ED provider, patient requiring hospitalization for possible transfusion reaction work-up.  Vitals in the ED was remarkable for T-max of 100.5, per ED provider temperature went up to 103.  Respiration rate within normal limits, sinus tachycardia with rate of 111, blood pressure appropriate 131/48.  Assessment/Plan  Principal Problem:   Thrombocytopenia ([place]) Active  Problems:   Benign essential hypertension   Anxiety   E coli bacteremia   Transfusion reaction   Fever and sinus tachycardia possible transfusion reaction - Oncology Dr. Tasia Catchings recommends admission and to contact blood bank and send phlebotomist for transfusion reaction work-up - Blood bank has been contacted by ED provider and will send a phlebotomist for transfusion reaction work-up - Blood cultures UA ordered - Checking CMP, if kidney within appropriate limits we will order a CTA to assess for PE - If serum creatinine is within appropriate limits will order Lasix 40 mg IV once  Thrombocytopenia- - Patient received partial unit of platelet - Checking CBC - If platelets are not appropriately increased, we will consult oncology for utilization of steroids  New headache-patient states that she does not get headaches regularly - CT of the head without contrast ordered  Given chest x-ray infiltrate- COVID test  E. coli bacteremia-she was discharged home with IV ertapenem 1 g daily - Patient completed last dose on 04/03/2020  Chart reviewed.   Hospitalization from 03/20/2020 to 03/26/2020 for sepsis secondary to E. coli bacteremia suspect secondary to right upper extremity cellulitis she was treated with empiric antibiotics.  CT of the right upper extremity did not show any evidence of abscess, bony involvement, or necrotizing fasciitis.  Blood cultures was positive for E. coli.  ID specialist and general surgeon were consulted to assist with management.  ID recommended IV ertapenem 1 g daily for 8 days at discharge.  DVT prophylaxis: Holding due to severe thrombocytopenia Code Status: DNR Diet: Regular Family Communication: No Disposition Plan: Pending clinical course Consults called: oncology  Admission status: Observation with telemetry  Past Medical History:  Diagnosis Date  . Anemia    Myelodysplastic syndrome  . Arthritis   .  Hypertension   . Leukemia, chronic lymphoid (HCC)     CLL  . Ovarian cancer ([place]) 1963   Past Surgical History:  Procedure Laterality Date  . ABDOMINAL HYSTERECTOMY    . APPENDECTOMY    . BREAST SURGERY     cyst removed from right breast  . EYE SURGERY Bilateral    Cataract Extractions  . IR FLUORO GUIDE CV LINE RIGHT  06/30/2017  . REVERSE SHOULDER ARTHROPLASTY Right 10/28/2014   Procedure: REVERSE SHOULDER ARTHROPLASTY;  Surgeon: Corky Mull, MD;  Location: [facility] ORS;  Service: Orthopedics;  Laterality: Right;   Social History:  reports that she quit smoking about 59 years ago. Her smoking use included cigarettes. She smoked 2.00 packs per day. She has never used smokeless tobacco. She reports previous alcohol use of about 1.0 standard drink of alcohol per week. She reports that she does not use drugs.  Allergies  Allergen Reactions  . Ciprofloxacin Nausea Only   History reviewed. No pertinent family history. Family history: Family history reviewed and not pertinent  Prior to Admission medications   Medication Sig Start Date End Date Taking? Authorizing Provider  acetaminophen (TYLENOL) 325 MG tablet Take 2 tablets (650 mg total) by mouth every 6 (six) hours as needed for mild pain (or Fever >/= 101). 03/26/20   Jennye Boroughs, MD  allopurinol (ZYLOPRIM) 300 MG tablet Take 1 tablet (300 mg total) by mouth daily. 03/19/20   Lloyd Huger, MD  ALPRAZolam Duanne Moron) 0.25 MG tablet Take 1 tablet (0.25 mg total) by mouth daily as needed for anxiety. 03/26/20   Jennye Boroughs, MD  amLODipine (NORVASC) 5 MG tablet Take 1 tablet (5 mg total) by mouth daily. 03/27/20   Jennye Boroughs, MD  aspirin 81 MG tablet Take 81 mg by mouth daily.    [provider]  cyanocobalamin (,VITAMIN B-12,) 1000 MCG/ML injection Inject into the muscle. 03/09/16   [provider]  diphenhydrAMINE (SOMINEX) 25 MG tablet Take 25 mg by mouth at bedtime.    [provider]  diphenoxylate-atropine (LOMOTIL) 2.5-0.025 MG tablet Take 2 tablets by  mouth every 6 (six) hours as needed for diarrhea or loose stools. 03/19/20   Verlon Au, NP  ertapenem [facility]) IVPB Inject 1 g into the vein daily for 8 days. Indication: ESBL E. Coli bacteremia with upper extremity skin and soft tissue infection  First Dose: Yes Last Day of Therapy:  04/04/2020 Labs - on 03/30/2020 :  CBC/D and CMP 03/27/20 04/04/20  Jennye Boroughs, MD  etodolac (LODINE) 400 MG tablet Take 400 mg by mouth daily. 06/04/19 06/03/20  [provider]  hydrALAZINE (APRESOLINE) 50 MG tablet Take 50 mg by mouth 2 (two) times daily. 12/20/19   [provider]  HYDROcodone-acetaminophen (NORCO/VICODIN) 5-325 MG tablet Take 1-2 tablets by mouth every 8 (eight) hours as needed for moderate pain or severe pain. 03/26/20   Jennye Boroughs, MD  hydroxyurea (HYDREA) 500 MG capsule Take 2 capsules (1,000 mg total) by mouth daily. May take with food to minimize GI side effects. 03/12/20   Lloyd Huger, MD  lidocaine-prilocaine (EMLA) cream Apply 1 application topically as needed. Apply to port 1-2 hours prior to appointment. Cover with plastic wrap. 07/18/19   Lloyd Huger, MD  loperamide (IMODIUM A-D) 2 MG tablet Take 1 tablet (2 mg total) by mouth 4 (four) times daily as needed for diarrhea or loose stools. 03/19/20   Lloyd Huger, MD  mirtazapine (REMERON) 7.5 MG tablet Take  1 tablet (7.5 mg total) by mouth at bedtime. 03/05/20   Borders, Daryl EasternJoshua R, NP  potassium chloride SA (KLOR-CON) 20 MEQ tablet Take 1 tablet (20 mEq total) by mouth 2 (two) times daily. 03/19/20   Jeralyn RuthsFinnegan, Timothy J, MD  tiZANidine (ZANAFLEX) 2 MG tablet Take 1 tablet (2 mg total) by mouth at bedtime as needed for muscle spasms. 12/29/16   Mauro KaufmannBurns, Jennifer E, NP   Physical Exam: Vitals:   04/03/20 1823 04/03/20 1930 04/03/20 2025 04/03/20 2130  BP: (!) 189/82 (!) 193/66 (!) 188/60 138/66  Pulse: (!) 114 (!) 120 (!) 132 (!) 109  Resp: (!) 22 (!) 21 (!) 22 19  Temp: 98 F (36.7 C)  (!)  100.5 F (38.1 C)   TempSrc: Oral  Oral   SpO2: 97% 95% 94% 94%  Weight:      Height:       Constitutional: appears age appropriate, cachectic and frail, NAD, calm, comfortable Eyes: PERRL, lids and conjunctivae normal ENMT: Mucous membranes are moist. Posterior pharynx clear of any exudate or lesions. Age-appropriate dentition. Hearing appropriate Neck: normal, supple, no masses, no thyromegaly Respiratory: clear to auscultation bilaterally, no wheezing, no crackles. Normal respiratory effort. No accessory muscle use.  Cardiovascular: Regular rate and rhythm, no murmurs / rubs / gallops. No extremity edema. 2+ pedal pulses. No carotid bruits. Right anterior upper chest wall port a cath in place and appears clean Abdomen: no tenderness, no masses palpated, no hepatosplenomegaly. Bowel sounds positive.  Musculoskeletal: no clubbing / cyanosis. No joint deformity upper and lower extremities. Good ROM, no contractures, no atrophy. Normal muscle tone.  Skin: no rashes, lesions, ulcers. No induration Neurologic: Sensation intact. Strength 5/5 in all 4.  Psychiatric: Normal judgment and insight. Alert and oriented x 3. Normal mood.   EKG: independently reviewed, showing sinus tachycardia with rate of 103, QTc 400  Chest x-ray on Admission: I personally reviewed and I agree with radiologist reading as below.  DG Chest Portable 1 View  Result Date: 04/03/2020 CLINICAL DATA:  Abnormal hemoglobin and platelet counts. EXAM: PORTABLE CHEST 1 VIEW COMPARISON:  March 20, 2020 FINDINGS: There is stable right-sided venous Port-A-Cath positioning. Stable chronic appearing increased lung markings are seen. Mild to moderate severity atelectasis and/or infiltrate is noted within the retrocardiac region of the left lung base. There is no evidence of a pleural effusion or pneumothorax. The cardiac silhouette is borderline in size. There is mild calcification of the aortic arch. Chronic right-sided rib  fractures are noted. An intact right shoulder replacement is seen. IMPRESSION: Mild to moderate severity left basilar atelectasis and/or infiltrate. Electronically Signed   By: Aram Candelahaddeus  Houston M.D.   On: 04/03/2020 22:43   Labs on Admission: I have personally reviewed following labs  CBC: Recent Labs  Lab 04/03/20 0928  WBC 11.7*  NEUTROABS 0.9*  HGB 7.2*  HCT 22.2*  MCV 93.3  PLT 8*   Urine analysis:    Component Value Date/Time   COLORURINE YELLOW (A) 04/03/2020 0928   APPEARANCEUR HAZY (A) 04/03/2020 0928   APPEARANCEUR Cloudy (A) 02/05/2018 1116   LABSPEC 1.016 04/03/2020 0928   LABSPEC 1.010 10/21/2012 0106   PHURINE 5.0 04/03/2020 0928   GLUCOSEU NEGATIVE 04/03/2020 0928   GLUCOSEU Negative 10/21/2012 0106   HGBUR NEGATIVE 04/03/2020 0928   BILIRUBINUR NEGATIVE 04/03/2020 0928   BILIRUBINUR Negative 02/05/2018 1116   BILIRUBINUR Negative 10/21/2012 0106   KETONESUR NEGATIVE 04/03/2020 0928   PROTEINUR NEGATIVE 04/03/2020 0928   NITRITE NEGATIVE 04/03/2020  9892   LEUKOCYTESUR TRACE (A) 04/03/2020 0928   LEUKOCYTESUR 1+ 10/21/2012 0106   Shelva Hetzer N Andray Assefa D.O. [facility]  If 7PM-7AM, please contact overnight-coverage provider If 7AM-7PM, please contact day coverage provider [email or web address]  04/03/2020, 11:54 PM

## 2020-04-03 NOTE — ED Notes (Addendum)
Bedside report taken from Liborio Nixon, [place].  Pt receiving platelet transfusion.  Pt noted to be shivering, but states "I'm very nervous and cold."  VS stable at this time.  Will continue to monitor for reactions.

## 2020-04-03 NOTE — ED Provider Notes (Addendum)
[facility] Emergency Department Provider Note  ____________________________________________  Time seen: Approximately 4:45 PM  I have reviewed the triage vital signs and the nursing notes.   HISTORY  Chief Complaint abnormal labs    HPI Jacqueline Russo is a 87 y.o. 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 Grayland Ormond, who was sent to the ED for evaluation due to routine labs this morning showing hemoglobin of 7.2 and platelet count of 8.  Patient states that she feels fine, no acute symptoms.  No rectal bleeding or dark/black stool.  No vomiting or fever.   Denies any excessive bruising or easy bleeding.  States that her arrangement with Dr. Grayland Ormond is that she does not need a Red cell transfusion if her hemoglobin is above 7.   Past Medical History:  Diagnosis Date  . Anemia    Myelodysplastic syndrome  . Arthritis   . Hypertension   . Leukemia, chronic lymphoid (HCC)    CLL  . Ovarian cancer [facility]) 1963     Patient Active Problem List   Diagnosis Date Noted  . E coli bacteremia 03/26/2020  . Malnutrition of moderate degree 03/24/2020  . Arm swelling   . Sepsis due to Escherichia coli (E. coli) ([place]) 03/20/2020  . Cellulitis 03/20/2020  . Abnormal CT of brain 03/20/2020  . Mantle cell lymphoma ([place]) 03/05/2020  . Goals of care, counseling/discussion 03/05/2020  . Anxiety 05/17/2019  . Hypokalemia due to inadequate potassium intake 05/17/2019  . Symptomatic anemia 05/17/2019  . Iron overload 05/17/2019  . MDS (myelodysplastic syndrome) with 5q deletion ([place]) 05/16/2019  . Port-A-Cath in place 02/18/2019  . Exertional dyspnea 10/04/2018  . Gouty arthropathy, chronic, without tophi 07/04/2018  . Right renal mass 02/28/2018  . DM type 2 with diabetic mixed hyperlipidemia ([place]) 08/25/2017  . Idiopathic peripheral neuropathy 02/22/2017  . CLL (chronic lymphocytic leukemia) ([place]) 11/16/2015  .  Myelodysplastic syndrome ([place]) 11/16/2015  . Vitamin B12 deficient megaloblastic anemia 10/22/2015  . Carotid stenosis, bilateral 07/21/2015  . Diet-controlled type 2 diabetes mellitus ([place]) 04/22/2015  . CKD (chronic kidney disease) stage 3, GFR 30-59 ml/min (HCC) 04/22/2015  . Benign essential hypertension 01/14/2014  . Hyperlipemia 01/14/2014  . Primary osteoarthritis of both hands 01/14/2014     Past Surgical History:  Procedure Laterality Date  . ABDOMINAL HYSTERECTOMY    . APPENDECTOMY    . BREAST SURGERY     cyst removed from right breast  . EYE SURGERY Bilateral    Cataract Extractions  . IR FLUORO GUIDE CV LINE RIGHT  06/30/2017  . REVERSE SHOULDER ARTHROPLASTY Right 10/28/2014   Procedure: REVERSE SHOULDER ARTHROPLASTY;  Surgeon: Corky Mull, MD;  Location: [facility] ORS;  Service: Orthopedics;  Laterality: Right;     Prior to Admission medications   Medication Sig Start Date End Date Taking? Authorizing Provider  acetaminophen (TYLENOL) 325 MG tablet Take 2 tablets (650 mg total) by mouth every 6 (six) hours as needed for mild pain (or Fever >/= 101). 03/26/20   Jennye Boroughs, MD  allopurinol (ZYLOPRIM) 300 MG tablet Take 1 tablet (300 mg total) by mouth daily. 03/19/20   Lloyd Huger, MD  ALPRAZolam Duanne Moron) 0.25 MG tablet Take 1 tablet (0.25 mg total) by mouth daily as needed for anxiety. 03/26/20   Jennye Boroughs, MD  amLODipine (NORVASC) 5 MG tablet Take 1 tablet (5 mg total) by mouth daily. 03/27/20   Jennye Boroughs, MD  aspirin 81 MG tablet Take 81 mg by mouth daily.    [provider]  cyanocobalamin (,VITAMIN B-12,) 1000 MCG/ML injection Inject into the muscle. 03/09/16   [provider]  diphenhydrAMINE (SOMINEX) 25 MG tablet Take 25 mg by mouth at bedtime.    [provider]  diphenoxylate-atropine (LOMOTIL) 2.5-0.025 MG tablet Take 2 tablets by mouth every 6 (six) hours as needed for diarrhea or loose stools. 03/19/20   Verlon Au, NP   ertapenem [facility]) IVPB Inject 1 g into the vein daily for 8 days. Indication: ESBL E. Coli bacteremia with upper extremity skin and soft tissue infection  First Dose: Yes Last Day of Therapy:  04/04/2020 Labs - on 03/30/2020 :  CBC/D and CMP 03/27/20 04/04/20  Jennye Boroughs, MD  etodolac (LODINE) 400 MG tablet Take 400 mg by mouth daily. 06/04/19 06/03/20  [provider]  hydrALAZINE (APRESOLINE) 50 MG tablet Take 50 mg by mouth 2 (two) times daily. 12/20/19   [provider]  HYDROcodone-acetaminophen (NORCO/VICODIN) 5-325 MG tablet Take 1-2 tablets by mouth every 8 (eight) hours as needed for moderate pain or severe pain. 03/26/20   Jennye Boroughs, MD  hydroxyurea (HYDREA) 500 MG capsule Take 2 capsules (1,000 mg total) by mouth daily. May take with food to minimize GI side effects. 03/12/20   Lloyd Huger, MD  lidocaine-prilocaine (EMLA) cream Apply 1 application topically as needed. Apply to port 1-2 hours prior to appointment. Cover with plastic wrap. 07/18/19   Lloyd Huger, MD  loperamide (IMODIUM A-D) 2 MG tablet Take 1 tablet (2 mg total) by mouth 4 (four) times daily as needed for diarrhea or loose stools. 03/19/20   Lloyd Huger, MD  mirtazapine (REMERON) 7.5 MG tablet Take 1 tablet (7.5 mg total) by mouth at bedtime. 03/05/20   Borders, Kirt Boys, NP  potassium chloride SA (KLOR-CON) 20 MEQ tablet Take 1 tablet (20 mEq total) by mouth 2 (two) times daily. 03/19/20   Lloyd Huger, MD  tiZANidine (ZANAFLEX) 2 MG tablet Take 1 tablet (2 mg total) by mouth at bedtime as needed for muscle spasms. 12/29/16   Jacquelin Hawking, NP     Allergies Ciprofloxacin   History reviewed. No pertinent family history.  Social History Social History   Tobacco Use  . Smoking status: Former Smoker    Packs/day: 2.00    Types: Cigarettes    Quit date: 1963    Years since quitting: 59.0  . Smokeless tobacco: Never Used  Vaping Use  . Vaping Use: Never used   Substance Use Topics  . Alcohol use: Not Currently    Alcohol/week: 1.0 standard drink    Types: 1 Glasses of wine per week    Comment: 1 glass of wine daily  . Drug use: No    Review of Systems  Constitutional:   No fever or chills.  ENT:   No sore throat. No rhinorrhea. Cardiovascular:   No chest pain or syncope. Respiratory:   No dyspnea or cough. Gastrointestinal:   Negative for abdominal pain, vomiting and diarrhea.  Musculoskeletal:   Negative for focal pain or swelling All other systems reviewed and are negative except as documented above in ROS and HPI.  ____________________________________________   PHYSICAL EXAM:  VITAL SIGNS: ED Triage Vitals  Enc Vitals Group     BP 04/03/20 1506 (!) 152/59     Pulse Rate 04/03/20 1506 96     Resp 04/03/20 1506 18  Temp --      Temp src --      SpO2 04/03/20 1506 97 %     Weight 04/03/20 1503 97 lb (44 kg)     Height 04/03/20 1503 5' (1.524 m)     Head Circumference --      Peak Flow --      Pain Score 04/03/20 1503 0     Pain Loc --      Pain Edu? --      Excl. in [place]? --     Vital signs reviewed, nursing assessments reviewed.   Constitutional:   Alert and oriented. Non-toxic appearance. Eyes:   Conjunctivae are normal. EOMI. PERRL. ENT      Head:   Normocephalic and atraumatic.      Nose:   Wearing a mask.      Mouth/Throat:   Wearing a mask.      Neck:   No meningismus. Full ROM. Hematological/Lymphatic/Immunilogical:   No cervical lymphadenopathy. Cardiovascular:   RRR. Symmetric bilateral radial and DP pulses.  No murmurs. Cap refill less than 2 seconds. Respiratory:   Normal respiratory effort without tachypnea/retractions. Breath sounds are clear and equal bilaterally. No wheezes/rales/rhonchi. Gastrointestinal:   Soft and nontender. Non distended.   No rebound, rigidity, or guarding.  Rectal exam shows brown stool, Hemoccult negative  Musculoskeletal:   Normal range of motion in all extremities. No  joint effusions.  No lower extremity tenderness.  No edema. Neurologic:   Normal speech and language.  Motor grossly intact. No acute focal neurologic deficits are appreciated.  Skin:    Skin is warm, dry and intact. No rash noted.  No petechiae, purpura, or bullae.  ____________________________________________    LABS (pertinent positives/negatives) (all labs ordered are listed, but only abnormal results are displayed) Labs Reviewed  TYPE AND SCREEN  PREPARE PLATELET PHERESIS   ____________________________________________   EKG    ____________________________________________    RADIOLOGY  No results found.  ____________________________________________   PROCEDURES .Critical Care Performed by: Carrie Mew, MD Authorized by: Carrie Mew, MD   Critical care provider statement:    Critical care time (minutes):  33   Critical care time was exclusive of:  Separately billable procedures and treating other patients   Critical care was necessary to treat or prevent imminent or life-threatening deterioration of the following conditions:  Circulatory failure   Critical care was time spent personally by me on the following activities:  Development of treatment plan with patient or surrogate, discussions with consultants, evaluation of patient's response to treatment, examination of patient, obtaining history from patient or surrogate, ordering and performing treatments and interventions, ordering and review of laboratory studies, ordering and review of radiographic studies, pulse oximetry, re-evaluation of patient's condition and review of old charts    ____________________________________________    CLINICAL IMPRESSION / [place] / ED COURSE  Medications ordered in the ED: Medications  0.9 %  sodium chloride infusion (has no administration in time range)    Pertinent labs & imaging results that were available during my care of the patient were  reviewed by me and considered in my medical decision making (see chart for details).  Jacqueline Russo was evaluated in Emergency Department on 04/03/2020 for the symptoms described in the history of present illness. She was evaluated in the context of the global COVID-19 pandemic, which necessitated consideration that the patient might be at risk for infection with the SARS-CoV-2 virus that causes COVID-19. Institutional protocols and algorithms that  pertain to the evaluation of patients at risk for COVID-19 are in a state of rapid change based on information released by regulatory bodies including the CDC and federal and state organizations. These policies and algorithms were followed during the patient's care in the ED.   Patient presents with asymptomatic severe thrombocytopenia.  Platelet count usually runs about 20-25, hemoglobin usually runs about 8.  This is consistent with her underlying disease and chemotherapy use.  Patient is very eager to go back to her skilled nursing facility and does not want to be admitted to the hospital if at all avoidable.  I discussed her care with oncology Dr. Tasia Catchings and Dr. Grayland Ormond who feel that she would be appropriate for continued outpatient follow-up after a platelet transfusion in the ED.  They will plan to get repeat labs and see her in clinic in 3 to 4 days after the weekend.  Patient verbally consents to platelet transfusion.  Clinical Course as of 04/03/20 2228  Fri Apr 03, 2020  2226 After the transfusion patient has developed fever of 100.5 with tachycardia of 130.  She was given some IV Benadryl and oral Tylenol.  I just rechecked and her temperature is now 103.  Heart rate is 115.  Blood pressure remained stable.  Patient states that she still feels fine.  Lungs are clear to auscultation.  Discussed this event with oncology Dr. Tasia Catchings who recommends proceeding with admission given a transfusion reaction.  Contacted blood bank will send phlebotomist for  transfusion reaction work-up.  Will obtain urinalysis and chest x-ray and blood culture. [PS]    Clinical Course User Index [PS] Carrie Mew, MD     ____________________________________________   FINAL CLINICAL IMPRESSION(S) / ED DIAGNOSES    Final diagnoses:  Mantle cell lymphoma, unspecified body region ([place])  Thrombocytopenia ([place])  Chronic anemia     ED Discharge Orders    None      Portions of this note were generated with dragon dictation software. Dictation errors may occur despite best attempts at proofreading.   Carrie Mew, MD 04/03/20 1649    Carrie Mew, MD 04/22/20 [phone]

## 2020-04-03 NOTE — ED Notes (Signed)
Platelets infusing at this time

## 2020-04-03 NOTE — ED Triage Notes (Signed)
Pt to ER via EMS from [place] with reports of Platelet count of 8 and Hgb of 7.2.  Pt has hx of lukemia and was sent over by Dr. Grayland Ormond.  Pt verbalizes no c/o at this time.

## 2020-04-03 NOTE — ED Notes (Signed)
Pt presents to ED with c/o of having abnormal labs at PCP office. Pt states she normally has low HgB and states multiple blood transfusions. Pt states she was sent her by oncologist due to a platelet count of 8. Pt denies any rectal bleeding, hematuria, or dark colored emesis. Pt denies any complaints at this time. Pt denies any other complaints at this time. Pt denies pain or SOB. VSS. Pt is A&Ox4.

## 2020-04-03 NOTE — ED Notes (Signed)
Pt brought to bedside bathroom for BM and to void via WC.

## 2020-04-03 NOTE — Telephone Encounter (Signed)
Tabitha from [facility] called reporting critical results of platelet count of 8. Please advise

## 2020-04-03 NOTE — Telephone Encounter (Signed)
OK, thanks

## 2020-04-03 NOTE — Telephone Encounter (Signed)
Call returned form Tabitha. Patient has agreed to go to ER

## 2020-04-04 ENCOUNTER — Observation Stay: Payer: Medicare Other

## 2020-04-04 ENCOUNTER — Encounter: Payer: Self-pay | Admitting: Internal Medicine

## 2020-04-04 DIAGNOSIS — Z79899 Other long term (current) drug therapy: Secondary | ICD-10-CM | POA: Diagnosis not present

## 2020-04-04 DIAGNOSIS — Z90710 Acquired absence of both cervix and uterus: Secondary | ICD-10-CM | POA: Diagnosis not present

## 2020-04-04 DIAGNOSIS — F419 Anxiety disorder, unspecified: Secondary | ICD-10-CM | POA: Diagnosis present

## 2020-04-04 DIAGNOSIS — T8092XA Unspecified transfusion reaction, initial encounter: Secondary | ICD-10-CM | POA: Diagnosis not present

## 2020-04-04 DIAGNOSIS — Z7982 Long term (current) use of aspirin: Secondary | ICD-10-CM | POA: Diagnosis not present

## 2020-04-04 DIAGNOSIS — R000 Tachycardia, unspecified: Secondary | ICD-10-CM | POA: Diagnosis not present

## 2020-04-04 DIAGNOSIS — D630 Anemia in neoplastic disease: Secondary | ICD-10-CM | POA: Diagnosis present

## 2020-04-04 DIAGNOSIS — Z20822 Contact with and (suspected) exposure to covid-19: Secondary | ICD-10-CM | POA: Diagnosis present

## 2020-04-04 DIAGNOSIS — Z8543 Personal history of malignant neoplasm of ovary: Secondary | ICD-10-CM | POA: Diagnosis not present

## 2020-04-04 DIAGNOSIS — D696 Thrombocytopenia, unspecified: Secondary | ICD-10-CM | POA: Diagnosis present

## 2020-04-04 DIAGNOSIS — Z881 Allergy status to other antibiotic agents status: Secondary | ICD-10-CM | POA: Diagnosis not present

## 2020-04-04 DIAGNOSIS — I10 Essential (primary) hypertension: Secondary | ICD-10-CM | POA: Diagnosis present

## 2020-04-04 DIAGNOSIS — Z66 Do not resuscitate: Secondary | ICD-10-CM | POA: Diagnosis present

## 2020-04-04 DIAGNOSIS — Z87891 Personal history of nicotine dependence: Secondary | ICD-10-CM | POA: Diagnosis not present

## 2020-04-04 DIAGNOSIS — E785 Hyperlipidemia, unspecified: Secondary | ICD-10-CM | POA: Diagnosis present

## 2020-04-04 DIAGNOSIS — A498 Other bacterial infections of unspecified site: Secondary | ICD-10-CM | POA: Diagnosis present

## 2020-04-04 DIAGNOSIS — Y848 Other medical procedures as the cause of abnormal reaction of the patient, or of later complication, without mention of misadventure at the time of the procedure: Secondary | ICD-10-CM | POA: Diagnosis not present

## 2020-04-04 DIAGNOSIS — R7881 Bacteremia: Secondary | ICD-10-CM | POA: Diagnosis present

## 2020-04-04 DIAGNOSIS — Z515 Encounter for palliative care: Secondary | ICD-10-CM | POA: Diagnosis not present

## 2020-04-04 DIAGNOSIS — J90 Pleural effusion, not elsewhere classified: Secondary | ICD-10-CM | POA: Diagnosis present

## 2020-04-04 DIAGNOSIS — R519 Headache, unspecified: Secondary | ICD-10-CM | POA: Diagnosis present

## 2020-04-04 DIAGNOSIS — R161 Splenomegaly, not elsewhere classified: Secondary | ICD-10-CM | POA: Diagnosis present

## 2020-04-04 DIAGNOSIS — Z9049 Acquired absence of other specified parts of digestive tract: Secondary | ICD-10-CM | POA: Diagnosis not present

## 2020-04-04 DIAGNOSIS — C8310 Mantle cell lymphoma, unspecified site: Secondary | ICD-10-CM | POA: Diagnosis present

## 2020-04-04 DIAGNOSIS — Z96611 Presence of right artificial shoulder joint: Secondary | ICD-10-CM | POA: Diagnosis present

## 2020-04-04 LAB — CBC WITH DIFFERENTIAL/PLATELET
Abs Immature Granulocytes: 0.09 10*3/uL — ABNORMAL HIGH (ref 0.00–0.07)
Basophils Absolute: 0.1 10*3/uL (ref 0.0–0.1)
Basophils Relative: 1 %
Eosinophils Absolute: 0 10*3/uL (ref 0.0–0.5)
Eosinophils Relative: 1 %
HCT: 17.3 % — ABNORMAL LOW (ref 36.0–46.0)
Hemoglobin: 5.5 g/dL — ABNORMAL LOW (ref 12.0–15.0)
Immature Granulocytes: 1 %
Lymphocytes Relative: 84 %
Lymphs Abs: 6.6 10*3/uL — ABNORMAL HIGH (ref 0.7–4.0)
MCH: 29.6 pg (ref 26.0–34.0)
MCHC: 31.8 g/dL (ref 30.0–36.0)
MCV: 93 fL (ref 80.0–100.0)
Monocytes Absolute: 0.3 10*3/uL (ref 0.1–1.0)
Monocytes Relative: 3 %
Neutro Abs: 0.8 10*3/uL — ABNORMAL LOW (ref 1.7–7.7)
Neutrophils Relative %: 10 %
Platelets: 29 10*3/uL — CL (ref 150–400)
RBC: 1.86 MIL/uL — ABNORMAL LOW (ref 3.87–5.11)
RDW: 15.9 % — ABNORMAL HIGH (ref 11.5–15.5)
WBC: 7.8 10*3/uL (ref 4.0–10.5)
nRBC: 0 % (ref 0.0–0.2)

## 2020-04-04 LAB — BASIC METABOLIC PANEL
Anion gap: 11 (ref 5–15)
BUN: 15 mg/dL (ref 8–23)
CO2: 21 mmol/L — ABNORMAL LOW (ref 22–32)
Calcium: 8.6 mg/dL — ABNORMAL LOW (ref 8.9–10.3)
Chloride: 108 mmol/L (ref 98–111)
Creatinine, Ser: 0.6 mg/dL (ref 0.44–1.00)
GFR, Estimated: >60 mL/min (ref >60)
Glucose, Bld: 111 mg/dL — ABNORMAL HIGH (ref 70–99)
Potassium: 3.5 mmol/L (ref 3.5–5.1)
Sodium: 140 mmol/L (ref 135–145)

## 2020-04-04 LAB — COMPREHENSIVE METABOLIC PANEL
ALT: 25 U/L (ref 0–44)
AST: 22 U/L (ref 15–41)
Albumin: 3 g/dL — ABNORMAL LOW (ref 3.5–5.0)
Alkaline Phosphatase: 73 U/L (ref 38–126)
Anion gap: 11 (ref 5–15)
BUN: 15 mg/dL (ref 8–23)
CO2: 22 mmol/L (ref 22–32)
Calcium: 8.7 mg/dL — ABNORMAL LOW (ref 8.9–10.3)
Chloride: 106 mmol/L (ref 98–111)
Creatinine, Ser: 0.62 mg/dL (ref 0.44–1.00)
GFR, Estimated: >60 mL/min (ref >60)
Glucose, Bld: 133 mg/dL — ABNORMAL HIGH (ref 70–99)
Potassium: 3.9 mmol/L (ref 3.5–5.1)
Sodium: 139 mmol/L (ref 135–145)
Total Bilirubin: 1 mg/dL (ref 0.3–1.2)
Total Protein: 5.8 g/dL — ABNORMAL LOW (ref 6.5–8.1)

## 2020-04-04 LAB — URINE CULTURE

## 2020-04-04 LAB — PREPARE RBC (CROSSMATCH)

## 2020-04-04 LAB — URINALYSIS, COMPLETE (UACMP) WITH MICROSCOPIC
Bacteria, UA: NONE SEEN
Bilirubin Urine: NEGATIVE
Glucose, UA: NEGATIVE mg/dL
Hgb urine dipstick: NEGATIVE
Ketones, ur: NEGATIVE mg/dL
Leukocytes,Ua: NEGATIVE
Nitrite: NEGATIVE
Protein, ur: NEGATIVE mg/dL
Specific Gravity, Urine: 1.011 (ref 1.005–1.030)
pH: 6 (ref 5.0–8.0)

## 2020-04-04 LAB — CBC
HCT: 17.3 % — ABNORMAL LOW (ref 36.0–46.0)
Hemoglobin: 5.7 g/dL — ABNORMAL LOW (ref 12.0–15.0)
MCH: 30.3 pg (ref 26.0–34.0)
MCHC: 32.9 g/dL (ref 30.0–36.0)
MCV: 92 fL (ref 80.0–100.0)
Platelets: 24 10*3/uL — CL (ref 150–400)
RBC: 1.88 MIL/uL — ABNORMAL LOW (ref 3.87–5.11)
RDW: 16 % — ABNORMAL HIGH (ref 11.5–15.5)
WBC: 8.9 10*3/uL (ref 4.0–10.5)
nRBC: 0 % (ref 0.0–0.2)

## 2020-04-04 LAB — RESP PANEL BY RT-PCR (FLU A&B, COVID) ARPGX2
Influenza A by PCR: NEGATIVE
Influenza B by PCR: NEGATIVE
SARS Coronavirus 2 by RT PCR: NEGATIVE

## 2020-04-04 LAB — TSH: TSH: 4.784 u[IU]/mL — ABNORMAL HIGH (ref 0.350–4.500)

## 2020-04-04 LAB — FIBRIN DERIVATIVES D-DIMER (ARMC ONLY): Fibrin derivatives D-dimer (ARMC): 5104.83 ng/mL (FEU) — ABNORMAL HIGH (ref 0.00–499.00)

## 2020-04-04 MED ORDER — IOHEXOL 350 MG/ML SOLN
75.0000 mL | Freq: Once | INTRAVENOUS | Status: AC | PRN
  Administered 2020-04-04: 75 mL via INTRAVENOUS

## 2020-04-04 MED ORDER — SODIUM CHLORIDE 0.9% IV SOLUTION
Freq: Once | INTRAVENOUS | Status: DC
  Filled 2020-04-04: qty 250

## 2020-04-04 MED ORDER — SODIUM CHLORIDE 0.9% IV SOLUTION
Freq: Once | INTRAVENOUS | Status: AC
  Filled 2020-04-04: qty 250

## 2020-04-04 MED ORDER — SODIUM CHLORIDE 0.9 % IV SOLN: 10.0000 mL/h | Freq: Once | INTRAVENOUS | Status: DC

## 2020-04-04 NOTE — ED Notes (Signed)
Patient is hallucinating.  Sitter at bedside

## 2020-04-04 NOTE — ED Notes (Signed)
Patient given Tylenol PO for arm pain and mild fever

## 2020-04-04 NOTE — Progress Notes (Signed)
PROGRESS NOTE    Jacqueline Russo  XTG:[id number] DOB: 18-Sep-1929 DOA: 04/03/2020 PCP: [name], MD   Brief Narrative:  This 87 years old female with PMH significant for mantle cell lymphoma stage IV, history of MDS, CLL presents to the emergency department from oncology clinic for low platelet counts.  Patient reports she was called to have 1 unit of platelet transfusion in the clinic,  approximately 1 hour into the transfusion she has developed fever and sinus tachycardia.  She denies any chest pain, shortness of breath, vomiting, abdominal pain.  She is admitted for possible transfusion reaction.  Assessment & Plan:   Principal Problem:   Thrombocytopenia ([place]) Active Problems:   Benign essential hypertension   Anxiety   E coli bacteremia   Transfusion reaction  Fever and sinus tachycardia possible transfusion reaction: Patient has developed fever and sinus tachycardia while having platelet transfusion. Oncology Dr. Tasia Catchings recommends admission and to contact blood bank and send phlebotomist for transfusion reaction work-up Blood bank has been contacted by ED provider and will send a phlebotomist for transfusion reaction work-up Blood cultures no growth so far, urine cultures no growth so far.  UA unremarkable. CTA chest negative for acute PE,  There is a moderate to large left-sided pleural effusion with adjacent compressive atelectasis. There is mediastinal, hilar, and left supraclavicular adenopathy. There is significant splenomegaly of the partially visualized spleen. Findings are concerning for a lymphoproliferative disorder, such as lymphoma. There is no source of infection found.  Chest x-ray shows infiltrate but COVID test and influenza negative.  Thrombocytopenia- - Patient received partial unit of platelet. -  Repeat CBC shows platelet count 24. - If platelets are not appropriately increased, we will consult oncology for utilization of steroids.  Normochromic normocytic  anemia: This could be secondary to lymphoproliferative disorder. Patient came with a hemoglobin of 5.5, transfuse 2 PRBC. Refused to have blood transfusion.   After explaining in detail he finally agreed to get 1 unit PRBC.  New headache - CT of the head without contrast negative for any acute abnormality.   Hx. Of  E. coli bacteremia-she was discharged home with IV ertapenem 1 g daily - Patient completed last dose on 04/03/2020.  Chart reviewed.   Hospitalization from 03/20/2020 to 03/26/2020 for sepsis secondary to E. coli bacteremia suspect secondary to right upper extremity cellulitis. she was treated with empiric antibiotics.  CT of the right upper extremity did not show any evidence of abscess, bony involvement, or necrotizing fasciitis.  Blood cultures was positive for E. coli.  ID specialist and general surgeon were consulted to assist with management.  ID recommended IV ertapenem 1 g daily for 8 days at discharge.  She completed antibiotics on 04/03/2020    DVT prophylaxis: SCDs  code Status: DNR Family Communication: No family at bedside  disposition Plan:  Status is: Inpatient  Remains inpatient appropriate because:Inpatient level of care appropriate due to severity of illness   Dispo: The patient is from: Home              Anticipated d/c is to: Home              Anticipated d/c date is: 3 days              Patient currently is not medically stable to d/c.  Consultants:   Oncology  Procedures:  None Antimicrobials:   Anti-infectives (From admission, onward)   None     Subjective: Patient was seen and examined at  bedside.  Overnight events noted.  Patient refused to get blood transfusion.  After explaining in detail she finally agreed to get 1 unit PRBC.  Objective: Vitals:   04/04/20 0815 04/04/20 1130 04/04/20 1225 04/04/20 1251  BP: (!) 162/57 (!) 158/59 (!) 144/62 119/62  Pulse: (!) 102  99 95  Resp: 18  17 (!) 22  Temp: 99 F (37.2 C)  98.4 F  (36.9 C) 98.2 F (36.8 C)  TempSrc: Oral  Oral Oral  SpO2: 97%   96%  Weight:      Height:       No intake or output data in the 24 hours ending 04/04/20 1525 Filed Weights   04/03/20 1503  Weight: 44 kg    Examination:  General exam: Appears calm and comfortable, not in any acute distress Respiratory system: Clear to auscultation. Respiratory effort normal. Cardiovascular system: S1 & S2 heard, RRR. No JVD, murmurs, rubs, gallops or clicks. No pedal edema. Gastrointestinal system: Abdomen is nondistended, soft and nontender. No organomegaly or masses felt. Normal bowel sounds heard. Central nervous system: Alert and oriented. No focal neurological deficits. Extremities: Symmetric 5 x 5 power. Skin: No rashes, lesions or ulcers Psychiatry: Judgement and insight appear normal. Mood & affect appropriate.     Data Reviewed: I have personally reviewed following labs and imaging studies  CBC: Recent Labs  Lab 04/03/20 0928 04/04/20 0107 04/04/20 0621  WBC 11.7* 7.8 8.9  NEUTROABS 0.9* 0.8*  --   HGB 7.2* 5.5* 5.7*  HCT 22.2* 17.3* 17.3*  MCV 93.3 93.0 92.0  PLT 8* 29* 24*   Basic Metabolic Panel: Recent Labs  Lab 04/04/20 0107 04/04/20 0621  NA 139 140  K 3.9 3.5  CL 106 108  CO2 22 21*  GLUCOSE 133* 111*  BUN 15 15  CREATININE 0.62 0.60  CALCIUM 8.7* 8.6*   GFR: Estimated Creatinine Clearance: 33.8 mL/min (by C-G formula based on SCr of 0.6 mg/dL). Liver Function Tests: Recent Labs  Lab 04/04/20 0107  AST 22  ALT 25  ALKPHOS 73  BILITOT 1.0  PROT 5.8*  ALBUMIN 3.0*   No results for input(s): LIPASE, AMYLASE in the last 168 hours. No results for input(s): AMMONIA in the last 168 hours. Coagulation Profile: No results for input(s): INR, PROTIME in the last 168 hours. Cardiac Enzymes: No results for input(s): CKTOTAL, CKMB, CKMBINDEX, TROPONINI in the last 168 hours. BNP (last 3 results) No results for input(s): PROBNP in the last 8760  hours. HbA1C: No results for input(s): HGBA1C in the last 72 hours. CBG: No results for input(s): GLUCAP in the last 168 hours. Lipid Profile: No results for input(s): CHOL, HDL, LDLCALC, TRIG, CHOLHDL, LDLDIRECT in the last 72 hours. Thyroid Function Tests: Recent Labs    04/04/20 0107  TSH 4.784*   Anemia Panel: No results for input(s): VITAMINB12, FOLATE, FERRITIN, TIBC, IRON, RETICCTPCT in the last 72 hours. Sepsis Labs: No results for input(s): PROCALCITON, LATICACIDVEN in the last 168 hours.  Recent Results (from the past 240 hour(s))  Resp Panel by RT-PCR (Flu A&B, Covid) Nasopharyngeal Swab     Status: None   Collection Time: 03/26/20 11:48 AM   Specimen: Nasopharyngeal Swab; Nasopharyngeal(NP) swabs in vial transport medium  Result Value Ref Range Status   SARS Coronavirus 2 by RT PCR NEGATIVE NEGATIVE Final    Comment: (NOTE) SARS-CoV-2 target nucleic acids are NOT DETECTED.  The SARS-CoV-2 RNA is generally detectable in upper respiratory specimens during the acute phase of  infection. The lowest concentration of SARS-CoV-2 viral copies this assay can detect is 138 copies/mL. A negative result does not preclude SARS-Cov-2 infection and should not be used as the sole basis for treatment or other patient management decisions. A negative result may occur with  improper specimen collection/handling, submission of specimen other than nasopharyngeal swab, presence of viral mutation(s) within the areas targeted by this assay, and inadequate number of viral copies(<138 copies/mL). A negative result must be combined with clinical observations, patient history, and epidemiological information. The expected result is Negative.  Fact Sheet for Patients:  EntrepreneurPulse.com.au  Fact Sheet for Healthcare Providers:  IncredibleEmployment.be  This test is no t yet approved or cleared by the [place] FDA and  has been authorized for  detection and/or diagnosis of SARS-CoV-2 by FDA under an Emergency Use Authorization (EUA). This EUA will remain  in effect (meaning this test can be used) for the duration of the COVID-19 declaration under Section 564(b)(1) of the Act, 21 U.S.C.section 360bbb-3(b)(1), unless the authorization is terminated  or revoked sooner.       Influenza A by PCR NEGATIVE NEGATIVE Final   Influenza B by PCR NEGATIVE NEGATIVE Final    Comment: (NOTE) The Xpert Xpress SARS-CoV-2/FLU/RSV plus assay is intended as an aid in the diagnosis of influenza from Nasopharyngeal swab specimens and should not be used as a sole basis for treatment. Nasal washings and aspirates are unacceptable for Xpert Xpress SARS-CoV-2/FLU/RSV testing.  Fact Sheet for Patients: EntrepreneurPulse.com.au  Fact Sheet for Healthcare Providers: IncredibleEmployment.be  This test is not yet approved or cleared by the [place] FDA and has been authorized for detection and/or diagnosis of SARS-CoV-2 by FDA under an Emergency Use Authorization (EUA). This EUA will remain in effect (meaning this test can be used) for the duration of the COVID-19 declaration under Section 564(b)(1) of the Act, 21 U.S.C. section 360bbb-3(b)(1), unless the authorization is terminated or revoked.  Performed at [facility], [address], [place], [place] [address]   Urine Culture     Status: Abnormal   Collection Time: 04/03/20 10:28 AM   Specimen: Urine, Clean Catch  Result Value Ref Range Status   Specimen Description   Final    URINE, CLEAN CATCH Performed at [facility], [address] [place], [place] [address]    Special Requests   Final    NONE Performed at [facility], [address] [place], [place] [address]    Culture MULTIPLE SPECIES PRESENT, SUGGEST RECOLLECTION (A)  Final   Report Status 04/04/2020 FINAL  Final  Culture, blood (routine x 2)      Status: None (Preliminary result)   Collection Time: 04/03/20 10:49 PM   Specimen: BLOOD  Result Value Ref Range Status   Specimen Description BLOOD LEFT ANTECUBITAL  Final   Special Requests   Final    BOTTLES DRAWN AEROBIC AND ANAEROBIC Blood Culture results may not be optimal due to an excessive volume of blood received in culture bottles   Culture   Final    NO GROWTH < 12 HOURS Performed at [facility], [address] [place], [place] [address]    Report Status PENDING  Incomplete  Culture, blood (routine x 2)     Status: None (Preliminary result)   Collection Time: 04/03/20 10:57 PM   Specimen: BLOOD  Result Value Ref Range Status   Specimen Description BLOOD BLOOD LEFT FOREARM  Final   Special Requests   Final  BOTTLES DRAWN AEROBIC AND ANAEROBIC Blood Culture results may not be optimal due to an excessive volume of blood received in culture bottles   Culture   Final    NO GROWTH < 12 HOURS Performed at [facility], [address] [place], [place] [address]    Report Status PENDING  Incomplete  Resp Panel by RT-PCR (Flu A&B, Covid) Nasopharyngeal Swab     Status: None   Collection Time: 04/03/20 11:15 PM   Specimen: Nasopharyngeal Swab; Nasopharyngeal(NP) swabs in vial transport medium  Result Value Ref Range Status   SARS Coronavirus 2 by RT PCR NEGATIVE NEGATIVE Final    Comment: (NOTE) SARS-CoV-2 target nucleic acids are NOT DETECTED.  The SARS-CoV-2 RNA is generally detectable in upper respiratory specimens during the acute phase of infection. The lowest concentration of SARS-CoV-2 viral copies this assay can detect is 138 copies/mL. A negative result does not preclude SARS-Cov-2 infection and should not be used as the sole basis for treatment or other patient management decisions. A negative result may occur with  improper specimen collection/handling, submission of specimen other than nasopharyngeal swab, presence of viral mutation(s)  within the areas targeted by this assay, and inadequate number of viral copies(<138 copies/mL). A negative result must be combined with clinical observations, patient history, and epidemiological information. The expected result is Negative.  Fact Sheet for Patients:  EntrepreneurPulse.com.au  Fact Sheet for Healthcare Providers:  IncredibleEmployment.be  This test is no t yet approved or cleared by the [place] FDA and  has been authorized for detection and/or diagnosis of SARS-CoV-2 by FDA under an Emergency Use Authorization (EUA). This EUA will remain  in effect (meaning this test can be used) for the duration of the COVID-19 declaration under Section 564(b)(1) of the Act, 21 U.S.C.section 360bbb-3(b)(1), unless the authorization is terminated  or revoked sooner.       Influenza A by PCR NEGATIVE NEGATIVE Final   Influenza B by PCR NEGATIVE NEGATIVE Final    Comment: (NOTE) The Xpert Xpress SARS-CoV-2/FLU/RSV plus assay is intended as an aid in the diagnosis of influenza from Nasopharyngeal swab specimens and should not be used as a sole basis for treatment. Nasal washings and aspirates are unacceptable for Xpert Xpress SARS-CoV-2/FLU/RSV testing.  Fact Sheet for Patients: EntrepreneurPulse.com.au  Fact Sheet for Healthcare Providers: IncredibleEmployment.be  This test is not yet approved or cleared by the [place] FDA and has been authorized for detection and/or diagnosis of SARS-CoV-2 by FDA under an Emergency Use Authorization (EUA). This EUA will remain in effect (meaning this test can be used) for the duration of the COVID-19 declaration under Section 564(b)(1) of the Act, 21 U.S.C. section 360bbb-3(b)(1), unless the authorization is terminated or revoked.  Performed at [facility], [address] [place], [place] [address]      Radiology Studies: CT HEAD WO  CONTRAST  Result Date: 04/04/2020 CLINICAL DATA:  Headache.  History of lymphoma. EXAM: CT HEAD WITHOUT CONTRAST TECHNIQUE: Contiguous axial images were obtained from the base of the skull through the vertex without intravenous contrast. COMPARISON:  03/19/2020 FINDINGS: Brain: There is no mass, hemorrhage or extra-axial collection. There is generalized atrophy without lobar predilection. Hypodensity of the white matter is most commonly associated with chronic microvascular disease. Vascular: Atherosclerotic calcification of the vertebral and internal carotid arteries at the skull base. No abnormal hyperdensity of the major intracranial arteries or dural venous sinuses. Skull: The visualized skull base, calvarium and extracranial soft tissues are normal. Sinuses/Orbits: No  fluid levels or advanced mucosal thickening of the visualized paranasal sinuses. No mastoid or middle ear effusion. The orbits are normal. IMPRESSION: 1. No acute intracranial abnormality. 2. Generalized atrophy and chronic microvascular ischemia. Electronically Signed   By: Ulyses Jarred M.D.   On: 04/04/2020 00:16   CT ANGIO CHEST PE W OR WO CONTRAST  Result Date: 04/04/2020 CLINICAL DATA:  Concern for PE.  Shortness of breath. EXAM: CT ANGIOGRAPHY CHEST WITH CONTRAST TECHNIQUE: Multidetector CT imaging of the chest was performed using the standard protocol during bolus administration of intravenous contrast. Multiplanar CT image reconstructions and MIPs were obtained to evaluate the vascular anatomy. CONTRAST:  43mL OMNIPAQUE IOHEXOL 350 MG/ML SOLN COMPARISON:  CT dated June 09, 2014 FINDINGS: Cardiovascular: Contrast injection is sufficient to demonstrate satisfactory opacification of the pulmonary arteries to the segmental level. There is no pulmonary embolus or evidence of right heart strain. The size of the main pulmonary artery is normal. Cardiomegaly with coronary artery calcification. The course and caliber of the aorta are  normal. There is mild atherosclerotic calcification. Opacification decreased due to pulmonary arterial phase contrast bolus timing. There is a right-sided central venous catheter with tip terminating near the cavoatrial junction. Mediastinum/Nodes: --mediastinal adenopathy is noted. For example there is a precarinal lymph node measuring approximately 1.7 cm. --hilar adenopathy is noted. -- No axillary lymphadenopathy. --there is left supraclavicular adenopathy. -- Normal thyroid gland where visualized. -  Unremarkable esophagus. Lungs/Pleura: There is a moderate to large left-sided pleural effusion with adjacent compressive atelectasis. There is no pneumothorax. The trachea is unremarkable. Upper Abdomen: Contrast bolus timing is not optimized for evaluation of the abdominal organs. There is significant splenomegaly of the partially visualized spleen. There is a right hepatic lobe cyst measuring approximately 4.8 cm. There is partially visualized intrahepatic biliary ductal dilatation. There may be some adenopathy at the level of the porta hepatis. There is moderate narrowing at the origin of the celiac axis. Musculoskeletal: There are old healed bilateral rib fractures. Review of the MIP images confirms the above findings. IMPRESSION: 1. There is no evidence for acute pulmonary embolus. 2. There is a moderate to large left-sided pleural effusion with adjacent compressive atelectasis. 3. There is mediastinal, hilar, and left supraclavicular adenopathy. There is significant splenomegaly of the partially visualized spleen. Findings are concerning for a lymphoproliferative disorder such as lymphoma. 4. There is partially visualized intrahepatic biliary ductal dilatation. Correlation with laboratory studies is recommended Aortic Atherosclerosis (ICD10-I70.0). Electronically Signed   By: Constance Holster M.D.   On: 04/04/2020 05:44   DG Chest Portable 1 View  Result Date: 04/03/2020 CLINICAL DATA:  Abnormal  hemoglobin and platelet counts. EXAM: PORTABLE CHEST 1 VIEW COMPARISON:  March 20, 2020 FINDINGS: There is stable right-sided venous Port-A-Cath positioning. Stable chronic appearing increased lung markings are seen. Mild to moderate severity atelectasis and/or infiltrate is noted within the retrocardiac region of the left lung base. There is no evidence of a pleural effusion or pneumothorax. The cardiac silhouette is borderline in size. There is mild calcification of the aortic arch. Chronic right-sided rib fractures are noted. An intact right shoulder replacement is seen. IMPRESSION: Mild to moderate severity left basilar atelectasis and/or infiltrate. Electronically Signed   By: Virgina Norfolk M.D.   On: 04/03/2020 22:43    Scheduled Meds: . sodium chloride   Intravenous Once  . allopurinol  300 mg Oral Daily  . amLODipine  5 mg Oral Daily  . aspirin EC  81 mg Oral Daily  .  hydroxyurea  1,000 mg Oral Daily  . mirtazapine  7.5 mg Oral QHS   Continuous Infusions: . sodium chloride       LOS: 0 days    Time spent: 35 mins.    Shawna Clamp, MD [facility]   If 7PM-7AM, please contact night-coverage

## 2020-04-04 NOTE — ED Notes (Signed)
Patient found half on and half off the bed, face down.  Moved to head of bed, 3 person lift.  She was fighting [place] the entire time.  Asking for [place] to get her "robe off the bathroom door because I don't have a key"

## 2020-04-04 NOTE — ED Notes (Signed)
Pt transported to CT at this time.

## 2020-04-04 NOTE — ED Notes (Signed)
Patient was yelling "help, help me", RN went in patients room and patient discussed with RN that she saw a woman standing beside her, and she asked the woman to help her and she said the woman would not help. Patient also thought she was in a clinic and she asked this RN why the windows were left open. Patient able to state her name and DOB.  RN also assisted patient to bathroom, patient urinated in toilet. Patient has a wobbly walk, and stopped when needed too to catch her breathe.

## 2020-04-04 NOTE — ED Notes (Signed)
Date and time results received: 04/04/20 0149  Test: PLT Critical Value: 29.0  Name of Provider Notified: Sharion Settler, NP   Orders Received? Or Actions Taken?: Actions Taken: Awaiting orders

## 2020-04-04 NOTE — ED Notes (Signed)
Pt urinated in brief, bedding was changed and new brief and new gown were applied.

## 2020-04-04 NOTE — ED Notes (Signed)
Patient RBC started

## 2020-04-04 NOTE — ED Notes (Signed)
Patient attempting to get out of bed and has slid down in the bed. RN caught patient sliding herself off bed. Patient said "they are telling me to slide" RN notified charge nurse and asked for a 1:1 Patient is hallucinating

## 2020-04-04 NOTE — ED Notes (Signed)
Date and time results received: 04/04/20 0216 (use smartphrase ".now" to insert current time)  Test: HGB  Critical Value: 5.5  Name of Provider Notified: Sharion Settler, NP  Orders Received? Or Actions Taken?: Awaiting orders.

## 2020-04-04 NOTE — ED Notes (Signed)
Patient is in bed, patient has thrown her legs over the bed and RN discussed with her about keeping her legs in bed

## 2020-04-04 NOTE — ED Notes (Signed)
Placed pt on bed pan.

## 2020-04-04 NOTE — ED Notes (Signed)
Tammy Scheck is patient's sister and is available should any questions need to be answered.  Her number is [phone]

## 2020-04-05 LAB — BPAM RBC
Blood Product Expiration Date: 202201272359
ISSUE DATE / TIME: 202201151205
Unit Type and Rh: 600

## 2020-04-05 LAB — TYPE AND SCREEN
ABO/RH(D): AB POS
Antibody Screen: NEGATIVE
Unit division: 0

## 2020-04-05 LAB — URINE CULTURE: Culture: <10000 — AB

## 2020-04-05 LAB — RETIC PANEL
Immature Retic Fract: 5.3 % (ref 2.3–15.9)
RBC.: 2.75 MIL/uL — ABNORMAL LOW (ref 3.87–5.11)
Retic Count, Absolute: 13.5 10*3/uL — ABNORMAL LOW (ref 19.0–186.0)
Retic Ct Pct: 0.5 % (ref 0.4–3.1)
Reticulocyte Hemoglobin: 38.9 pg (ref >27.9)

## 2020-04-05 LAB — CBC
HCT: 23.7 % — ABNORMAL LOW (ref 36.0–46.0)
Hemoglobin: 8 g/dL — ABNORMAL LOW (ref 12.0–15.0)
MCH: 29.9 pg (ref 26.0–34.0)
MCHC: 33.8 g/dL (ref 30.0–36.0)
MCV: 88.4 fL (ref 80.0–100.0)
Platelets: 18 10*3/uL — CL (ref 150–400)
RBC: 2.68 MIL/uL — ABNORMAL LOW (ref 3.87–5.11)
RDW: 15.9 % — ABNORMAL HIGH (ref 11.5–15.5)
WBC: 9.5 10*3/uL (ref 4.0–10.5)
nRBC: 0 % (ref 0.0–0.2)

## 2020-04-05 LAB — MAGNESIUM: Magnesium: 1.8 mg/dL (ref 1.7–2.4)

## 2020-04-05 LAB — BASIC METABOLIC PANEL
Anion gap: 13 (ref 5–15)
BUN: 12 mg/dL (ref 8–23)
CO2: 21 mmol/L — ABNORMAL LOW (ref 22–32)
Calcium: 9.1 mg/dL (ref 8.9–10.3)
Chloride: 107 mmol/L (ref 98–111)
Creatinine, Ser: 0.57 mg/dL (ref 0.44–1.00)
GFR, Estimated: >60 mL/min (ref >60)
Glucose, Bld: 108 mg/dL — ABNORMAL HIGH (ref 70–99)
Potassium: 3.2 mmol/L — ABNORMAL LOW (ref 3.5–5.1)
Sodium: 141 mmol/L (ref 135–145)

## 2020-04-05 LAB — IMMATURE PLATELET FRACTION: Immature Platelet Fraction: 4.1 % (ref 1.2–8.6)

## 2020-04-05 LAB — PHOSPHORUS: Phosphorus: 2.7 mg/dL (ref 2.5–4.6)

## 2020-04-05 MED ORDER — POTASSIUM CHLORIDE 20 MEQ PO PACK
40.0000 meq | PACK | Freq: Once | ORAL | Status: AC
  Administered 2020-04-05: 40 meq via ORAL
  Filled 2020-04-05: qty 2

## 2020-04-05 NOTE — ED Notes (Signed)
Patient is resting comfortably. 

## 2020-04-05 NOTE — ED Notes (Signed)
Provided phone for pt to speak with sister.

## 2020-04-05 NOTE — Progress Notes (Signed)
PROGRESS NOTE    Jacqueline Russo  YQM:[id number] DOB: 18-Apr-1929 DOA: 04/03/2020 PCP: [name], MD   Brief Narrative:  This 87 years old female with PMH significant for mantle cell lymphoma stage IV, history of MDS, CLL presents to the emergency department from oncology clinic for low platelet counts.  Patient reports she was called to have 1 unit of platelet transfusion in the clinic,  approximately 1 hour into the transfusion she has developed fever and sinus tachycardia.  She denies any chest pain, shortness of breath, vomiting, abdominal pain.  She is admitted for possible transfusion reaction.  Assessment & Plan:   Principal Problem:   Thrombocytopenia ([place]) Active Problems:   Benign essential hypertension   Anxiety   E coli bacteremia   Transfusion reaction  Fever and sinus tachycardia possible transfusion reaction: Patient has developed fever and sinus tachycardia while having platelet transfusion. Oncology Dr. Tasia Catchings recommends admission and to contact blood bank and send phlebotomist for transfusion reaction work-up Blood bank has been contacted by ED provider and will send a phlebotomist for transfusion reaction work-up Blood cultures no growth so far, urine cultures no growth so far.  UA unremarkable. CTA chest negative for acute PE,  There is a moderate to large left-sided pleural effusion with adjacent compressive atelectasis. There is mediastinal, hilar, and left supraclavicular adenopathy. There is significant splenomegaly of the partially visualized spleen. Findings are concerning for a lymphoproliferative disorder, such as lymphoma. There is no source of infection found.  Chest x-ray shows infiltrate but COVID test and influenza negative. She remained afebrile since admitted.  Thrombocytopenia- - Patient received partial unit of platelet. -  Repeat CBC shows platelet count 24 trended down, no active bleeding - If platelets are not appropriately increased, we will  consult oncology for utilization of steroids. - Heme oncology consulted awaiting recommendation.  Normochromic normocytic anemia: This could be secondary to lymphoproliferative disorder. Patient came with a hemoglobin of 5.5, transfuse 2 PRBC. Refused to have blood transfusion.   After explaining in detail he finally agreed to get 1 unit PRBC. Posttransfusion CBC hemoglobin 8.0  New headache - Resolved. - CT of the head without contrast negative for any acute abnormality.   Hx. Of  E. coli bacteremia-she was discharged home with IV ertapenem 1 g daily - Patient completed last dose on 04/03/2020.  Chart reviewed.   Hospitalization from 03/20/2020 to 03/26/2020 for sepsis secondary to E. coli bacteremia suspect secondary to right upper extremity cellulitis. she was treated with empiric antibiotics.  CT of the right upper extremity did not show any evidence of abscess, bony involvement, or necrotizing fasciitis.  Blood cultures was positive for E. coli.  ID specialist and general surgeon were consulted to assist with management.  ID recommended IV ertapenem 1 g daily for 8 days at discharge.  She completed antibiotics on 04/03/2020    DVT prophylaxis: SCDs  code Status: DNR Family Communication: No family at bedside  disposition Plan:  Status is: Inpatient  Remains inpatient appropriate because:Inpatient level of care appropriate due to severity of illness   Dispo: The patient is from: Home              Anticipated d/c is to: Home              Anticipated d/c date is: 3 days              Patient currently is not medically stable to d/c.  Consultants:   Oncology  Procedures:  None Antimicrobials:   Anti-infectives (From admission, onward)   None     Subjective: Patient was seen and examined at bedside.  Overnight events noted.  Patient was agitated overnight.  She seems to be more alert,  Awake and following commands in the morning.  She has a dry bleeding noted on both  edges of mouth, She has bitten her tongue last night.  Objective: Vitals:   04/05/20 0700 04/05/20 0800 04/05/20 0830 04/05/20 1201  BP: (!) 157/66 (!) 159/91 (!) 142/62 (!) 145/78  Pulse: (!) 104 (!) 115 100 (!) 105  Resp: 17  14 14   Temp:      TempSrc:      SpO2: 97% 92% 93% 92%  Weight:      Height:        Intake/Output Summary (Last 24 hours) at 04/05/2020 1329 Last data filed at 04/04/2020 1530 Gross per 24 hour  Intake 370 ml  Output --  Net 370 ml   Filed Weights   04/03/20 1503  Weight: 44 kg    Examination:  General exam: Appears calm and comfortable, not in any acute distress Respiratory system: Clear to auscultation. Respiratory effort normal. Cardiovascular system: S1 & S2 heard, RRR. No JVD, murmurs, rubs, gallops or clicks. No pedal edema. Gastrointestinal system: Abdomen is nondistended, soft and nontender. No organomegaly or masses felt. Normal bowel sounds heard. Central nervous system: Alert and oriented. No focal neurological deficits. Extremities: Symmetric 5 x 5 power. Skin: No rashes, lesions or ulcers Psychiatry: Judgement and insight appear normal. Mood & affect appropriate.     Data Reviewed: I have personally reviewed following labs and imaging studies  CBC: Recent Labs  Lab 04/03/20 0928 04/04/20 0107 04/04/20 0621 04/05/20 0506  WBC 11.7* 7.8 8.9 9.5  NEUTROABS 0.9* 0.8*  --   --   HGB 7.2* 5.5* 5.7* 8.0*  HCT 22.2* 17.3* 17.3* 23.7*  MCV 93.3 93.0 92.0 88.4  PLT 8* 29* 24* 18*   Basic Metabolic Panel: Recent Labs  Lab 04/04/20 0107 04/04/20 0621 04/05/20 0506  NA 139 140 141  K 3.9 3.5 3.2*  CL 106 108 107  CO2 22 21* 21*  GLUCOSE 133* 111* 108*  BUN 15 15 12   CREATININE 0.62 0.60 0.57  CALCIUM 8.7* 8.6* 9.1  MG  --   --  1.8  PHOS  --   --  2.7   GFR: Estimated Creatinine Clearance: 33.8 mL/min (by C-G formula based on SCr of 0.57 mg/dL). Liver Function Tests: Recent Labs  Lab 04/04/20 0107  AST 22  ALT 25   ALKPHOS 73  BILITOT 1.0  PROT 5.8*  ALBUMIN 3.0*   No results for input(s): LIPASE, AMYLASE in the last 168 hours. No results for input(s): AMMONIA in the last 168 hours. Coagulation Profile: No results for input(s): INR, PROTIME in the last 168 hours. Cardiac Enzymes: No results for input(s): CKTOTAL, CKMB, CKMBINDEX, TROPONINI in the last 168 hours. BNP (last 3 results) No results for input(s): PROBNP in the last 8760 hours. HbA1C: No results for input(s): HGBA1C in the last 72 hours. CBG: No results for input(s): GLUCAP in the last 168 hours. Lipid Profile: No results for input(s): CHOL, HDL, LDLCALC, TRIG, CHOLHDL, LDLDIRECT in the last 72 hours. Thyroid Function Tests: Recent Labs    04/04/20 0107  TSH 4.784*   Anemia Panel: Recent Labs    04/05/20 0506  RETICCTPCT 0.5   Sepsis Labs: No results for input(s): PROCALCITON, LATICACIDVEN in the last  168 hours.  Recent Results (from the past 240 hour(s))  Urine Culture     Status: Abnormal   Collection Time: 04/03/20 10:28 AM   Specimen: Urine, Clean Catch  Result Value Ref Range Status   Specimen Description   Final    URINE, CLEAN CATCH Performed at [facility], [address] [place], [place] [address]    Special Requests   Final    NONE Performed at [facility], [address] [place], [place] [address]    Culture MULTIPLE SPECIES PRESENT, SUGGEST RECOLLECTION (A)  Final   Report Status 04/04/2020 FINAL  Final  Culture, blood (routine x 2)     Status: None (Preliminary result)   Collection Time: 04/03/20 10:49 PM   Specimen: BLOOD  Result Value Ref Range Status   Specimen Description BLOOD LEFT ANTECUBITAL  Final   Special Requests   Final    BOTTLES DRAWN AEROBIC AND ANAEROBIC Blood Culture results may not be optimal due to an excessive volume of blood received in culture bottles   Culture   Final    NO GROWTH 2 DAYS Performed at [facility], [address],  [place], [place] [address]    Report Status PENDING  Incomplete  Culture, blood (routine x 2)     Status: None (Preliminary result)   Collection Time: 04/03/20 10:57 PM   Specimen: BLOOD  Result Value Ref Range Status   Specimen Description BLOOD BLOOD LEFT FOREARM  Final   Special Requests   Final    BOTTLES DRAWN AEROBIC AND ANAEROBIC Blood Culture results may not be optimal due to an excessive volume of blood received in culture bottles   Culture   Final    NO GROWTH 2 DAYS Performed at [facility], [address], [place], [place] [address]    Report Status PENDING  Incomplete  Resp Panel by RT-PCR (Flu A&B, Covid) Nasopharyngeal Swab     Status: None   Collection Time: 04/03/20 11:15 PM   Specimen: Nasopharyngeal Swab; Nasopharyngeal(NP) swabs in vial transport medium  Result Value Ref Range Status   SARS Coronavirus 2 by RT PCR NEGATIVE NEGATIVE Final    Comment: (NOTE) SARS-CoV-2 target nucleic acids are NOT DETECTED.  The SARS-CoV-2 RNA is generally detectable in upper respiratory specimens during the acute phase of infection. The lowest concentration of SARS-CoV-2 viral copies this assay can detect is 138 copies/mL. A negative result does not preclude SARS-Cov-2 infection and should not be used as the sole basis for treatment or other patient management decisions. A negative result may occur with  improper specimen collection/handling, submission of specimen other than nasopharyngeal swab, presence of viral mutation(s) within the areas targeted by this assay, and inadequate number of viral copies(<138 copies/mL). A negative result must be combined with clinical observations, patient history, and epidemiological information. The expected result is Negative.  Fact Sheet for Patients:  EntrepreneurPulse.com.au  Fact Sheet for Healthcare Providers:  IncredibleEmployment.be  This test is no t yet approved or cleared by the [place] FDA and  has been authorized for detection and/or diagnosis of SARS-CoV-2 by FDA under an Emergency Use Authorization (EUA). This EUA will remain  in effect (meaning this test can be used) for the duration of the COVID-19 declaration under Section 564(b)(1) of the Act, 21 U.S.C.section 360bbb-3(b)(1), unless the authorization is terminated  or revoked sooner.       Influenza A by PCR NEGATIVE NEGATIVE Final   Influenza B by PCR  NEGATIVE NEGATIVE Final    Comment: (NOTE) The Xpert Xpress SARS-CoV-2/FLU/RSV plus assay is intended as an aid in the diagnosis of influenza from Nasopharyngeal swab specimens and should not be used as a sole basis for treatment. Nasal washings and aspirates are unacceptable for Xpert Xpress SARS-CoV-2/FLU/RSV testing.  Fact Sheet for Patients: EntrepreneurPulse.com.au  Fact Sheet for Healthcare Providers: IncredibleEmployment.be  This test is not yet approved or cleared by the [place] FDA and has been authorized for detection and/or diagnosis of SARS-CoV-2 by FDA under an Emergency Use Authorization (EUA). This EUA will remain in effect (meaning this test can be used) for the duration of the COVID-19 declaration under Section 564(b)(1) of the Act, 21 U.S.C. section 360bbb-3(b)(1), unless the authorization is terminated or revoked.  Performed at [facility], [address], [place], [place] [address]   Urine culture     Status: Abnormal   Collection Time: 04/03/20 11:59 PM   Specimen: Urine, Random  Result Value Ref Range Status   Specimen Description   Final    URINE, RANDOM Performed at [facility], [address], [place], [place] [address]    Special Requests   Final    NONE Performed at [facility], [address] [place], [place] [address]    Culture (A)  Final    <10,000 COLONIES/mL INSIGNIFICANT GROWTH Performed at [facility], [place] [address], [place], [place] [address]    Report Status 04/05/2020 FINAL  Final     Radiology Studies: CT HEAD WO CONTRAST  Result Date: 04/04/2020 CLINICAL DATA:  Headache.  History of lymphoma. EXAM: CT HEAD WITHOUT CONTRAST TECHNIQUE: Contiguous axial images were obtained from the base of the skull through the vertex without intravenous contrast. COMPARISON:  03/19/2020 FINDINGS: Brain: There is no mass, hemorrhage or extra-axial collection. There is generalized atrophy without lobar predilection. Hypodensity of the white matter is most commonly associated with chronic microvascular disease. Vascular: Atherosclerotic calcification of the vertebral and internal carotid arteries at the skull base. No abnormal hyperdensity of the major intracranial arteries or dural venous sinuses. Skull: The visualized skull base, calvarium and extracranial soft tissues are normal. Sinuses/Orbits: No fluid levels or advanced mucosal thickening of the visualized paranasal sinuses. No mastoid or middle ear effusion. The orbits are normal. IMPRESSION: 1. No acute intracranial abnormality. 2. Generalized atrophy and chronic microvascular ischemia. Electronically Signed   By: Ulyses Jarred M.D.   On: 04/04/2020 00:16   CT ANGIO CHEST PE W OR WO CONTRAST  Result Date: 04/04/2020 CLINICAL DATA:  Concern for PE.  Shortness of breath. EXAM: CT ANGIOGRAPHY CHEST WITH CONTRAST TECHNIQUE: Multidetector CT imaging of the chest was performed using the standard protocol during bolus administration of intravenous contrast. Multiplanar CT image reconstructions and MIPs were obtained to evaluate the vascular anatomy. CONTRAST:  53mL OMNIPAQUE IOHEXOL 350 MG/ML SOLN COMPARISON:  CT dated June 09, 2014 FINDINGS: Cardiovascular: Contrast injection is sufficient to demonstrate satisfactory opacification of the pulmonary arteries to the segmental level. There is no pulmonary embolus or evidence of right heart strain. The size of the main pulmonary artery  is normal. Cardiomegaly with coronary artery calcification. The course and caliber of the aorta are normal. There is mild atherosclerotic calcification. Opacification decreased due to pulmonary arterial phase contrast bolus timing. There is a right-sided central venous catheter with tip terminating near the cavoatrial junction. Mediastinum/Nodes: --mediastinal adenopathy is noted. For example there is a precarinal lymph node measuring approximately 1.7 cm. --hilar  adenopathy is noted. -- No axillary lymphadenopathy. --there is left supraclavicular adenopathy. -- Normal thyroid gland where visualized. -  Unremarkable esophagus. Lungs/Pleura: There is a moderate to large left-sided pleural effusion with adjacent compressive atelectasis. There is no pneumothorax. The trachea is unremarkable. Upper Abdomen: Contrast bolus timing is not optimized for evaluation of the abdominal organs. There is significant splenomegaly of the partially visualized spleen. There is a right hepatic lobe cyst measuring approximately 4.8 cm. There is partially visualized intrahepatic biliary ductal dilatation. There may be some adenopathy at the level of the porta hepatis. There is moderate narrowing at the origin of the celiac axis. Musculoskeletal: There are old healed bilateral rib fractures. Review of the MIP images confirms the above findings. IMPRESSION: 1. There is no evidence for acute pulmonary embolus. 2. There is a moderate to large left-sided pleural effusion with adjacent compressive atelectasis. 3. There is mediastinal, hilar, and left supraclavicular adenopathy. There is significant splenomegaly of the partially visualized spleen. Findings are concerning for a lymphoproliferative disorder such as lymphoma. 4. There is partially visualized intrahepatic biliary ductal dilatation. Correlation with laboratory studies is recommended Aortic Atherosclerosis (ICD10-I70.0). Electronically Signed   By: Constance Holster M.D.   On:  04/04/2020 05:44   DG Chest Portable 1 View  Result Date: 04/03/2020 CLINICAL DATA:  Abnormal hemoglobin and platelet counts. EXAM: PORTABLE CHEST 1 VIEW COMPARISON:  March 20, 2020 FINDINGS: There is stable right-sided venous Port-A-Cath positioning. Stable chronic appearing increased lung markings are seen. Mild to moderate severity atelectasis and/or infiltrate is noted within the retrocardiac region of the left lung base. There is no evidence of a pleural effusion or pneumothorax. The cardiac silhouette is borderline in size. There is mild calcification of the aortic arch. Chronic right-sided rib fractures are noted. An intact right shoulder replacement is seen. IMPRESSION: Mild to moderate severity left basilar atelectasis and/or infiltrate. Electronically Signed   By: Virgina Norfolk M.D.   On: 04/03/2020 22:43    Scheduled Meds: . sodium chloride   Intravenous Once  . allopurinol  300 mg Oral Daily  . amLODipine  5 mg Oral Daily  . aspirin EC  81 mg Oral Daily  . hydroxyurea  1,000 mg Oral Daily  . mirtazapine  7.5 mg Oral QHS   Continuous Infusions: . sodium chloride       LOS: 1 day    Time spent: 25 mins.    Shawna Clamp, MD [facility]   If 7PM-7AM, please contact night-coverage

## 2020-04-05 NOTE — ED Notes (Signed)
Pt is sleeping. Will administer ordered AM meds when pt awakens.

## 2020-04-05 NOTE — ED Notes (Signed)
Patient in pleasant mood, allowing this RN to draw blood for AM labs. Sitter remains bedside. Patient given water per request.

## 2020-04-05 NOTE — ED Notes (Signed)
Gave sip[s of water. Provided lip moisturizer to lips.

## 2020-04-05 NOTE — ED Notes (Signed)
Pt continues to attempt to get out of bed. Moved to hall so nurses can watch more closely. Provided warm blanket, ice water and applied moisturizer to lips.

## 2020-04-05 NOTE — ED Notes (Signed)
Pts brief changed and new chuck placed.

## 2020-04-05 NOTE — ED Notes (Signed)
Pt given a cup of ginger ale.

## 2020-04-05 NOTE — ED Notes (Signed)
Patient was calling out for nurse. This nurse entered room. Pt was worried because she "needed to get to a 0700 doctor's appt". Reoriented pt to place and situation. Pt calm now, resting in bed.

## 2020-04-05 NOTE — ED Notes (Signed)
New bed alarm placed under pt. Pt confused. According to night nurse, pt refused second unit of blood. Pt resting in bed.

## 2020-04-05 NOTE — ED Notes (Signed)
Patient awake when I walked into the room.  She begins talking about getting out of bed to sit on the floor.  Patient advised to stay in bed so we can keep her safe. Sitter in the room

## 2020-04-05 NOTE — ED Notes (Signed)
Sitter bedside. Patient in NAD, VSS.

## 2020-04-05 NOTE — ED Notes (Signed)
Pt awake now. Asking for a phone because she "can't reach the phone". Informed that her sister called earlier.

## 2020-04-05 NOTE — ED Notes (Signed)
Sent msg to provider re: holding aspirin because of low platelets on labwork.

## 2020-04-05 NOTE — ED Notes (Signed)
Patient is resting comfortably. Sitter at bedside.  

## 2020-04-05 NOTE — ED Notes (Signed)
Gave sips of water with potassium mixed in (ordered potassium).

## 2020-04-06 DIAGNOSIS — D696 Thrombocytopenia, unspecified: Principal | ICD-10-CM

## 2020-04-06 LAB — CBC
HCT: 23.3 % — ABNORMAL LOW (ref 36.0–46.0)
Hemoglobin: 7.8 g/dL — ABNORMAL LOW (ref 12.0–15.0)
MCH: 29.9 pg (ref 26.0–34.0)
MCHC: 33.5 g/dL (ref 30.0–36.0)
MCV: 89.3 fL (ref 80.0–100.0)
Platelets: 11 10*3/uL — CL (ref 150–400)
RBC: 2.61 MIL/uL — ABNORMAL LOW (ref 3.87–5.11)
RDW: 15.6 % — ABNORMAL HIGH (ref 11.5–15.5)
WBC: 9.1 10*3/uL (ref 4.0–10.5)
nRBC: 0 % (ref 0.0–0.2)

## 2020-04-06 LAB — TRANSFUSION REACTION
DAT C3: NEGATIVE
Post RXN DAT IgG: NEGATIVE

## 2020-04-06 LAB — PREPARE PLATELET PHERESIS: Unit division: 0

## 2020-04-06 LAB — BASIC METABOLIC PANEL
Anion gap: 14 (ref 5–15)
BUN: 24 mg/dL — ABNORMAL HIGH (ref 8–23)
CO2: 22 mmol/L (ref 22–32)
Calcium: 9 mg/dL (ref 8.9–10.3)
Chloride: 107 mmol/L (ref 98–111)
Creatinine, Ser: 0.58 mg/dL (ref 0.44–1.00)
GFR, Estimated: >60 mL/min (ref >60)
Glucose, Bld: 113 mg/dL — ABNORMAL HIGH (ref 70–99)
Potassium: 3 mmol/L — ABNORMAL LOW (ref 3.5–5.1)
Sodium: 143 mmol/L (ref 135–145)

## 2020-04-06 LAB — BPAM PLATELET PHERESIS
Blood Product Expiration Date: 202201172359
ISSUE DATE / TIME: 202201141749
Unit Type and Rh: 6200

## 2020-04-06 LAB — LACTATE DEHYDROGENASE: LDH: 204 U/L — ABNORMAL HIGH (ref 98–192)

## 2020-04-06 LAB — MAGNESIUM: Magnesium: 1.9 mg/dL (ref 1.7–2.4)

## 2020-04-06 LAB — PHOSPHORUS: Phosphorus: 3.3 mg/dL (ref 2.5–4.6)

## 2020-04-06 LAB — PATHOLOGIST SMEAR REVIEW

## 2020-04-06 MED ORDER — POTASSIUM CHLORIDE CRYS ER 20 MEQ PO TBCR
40.0000 meq | EXTENDED_RELEASE_TABLET | ORAL | Status: AC
  Administered 2020-04-06 (×2): 40 meq via ORAL
  Filled 2020-04-06: qty 2

## 2020-04-06 MED ORDER — SODIUM CHLORIDE 0.9% IV SOLUTION
Freq: Once | INTRAVENOUS | Status: DC
  Filled 2020-04-06: qty 250

## 2020-04-06 MED ORDER — SODIUM CHLORIDE 0.9% IV SOLUTION
Freq: Once | INTRAVENOUS | Status: AC
  Filled 2020-04-06: qty 250

## 2020-04-06 MED ORDER — POTASSIUM CHLORIDE 20 MEQ PO PACK
40.0000 meq | PACK | Freq: Once | ORAL | Status: AC
  Administered 2020-04-06: 40 meq via ORAL
  Filled 2020-04-06: qty 2

## 2020-04-06 NOTE — Consult Note (Signed)
[place]  [phone] [phone]  ID: Chaney Born OB: [birth year]  MR#: [id number]  GBT#:[id number]  Patient Care Team: [name], MD as PCP - General (Internal Medicine) Lloyd Huger, MD as Consulting Physician (Hematology and Oncology)  CHIEF COMPLAINT: Stage IV mantle cell carcinoma, thrombocytopenia, anemia.  INTERVAL HISTORY: Patient is an 87 year old female actively receiving treatment for stage IV mantle cell carcinoma who was initially sent to the emergency room for platelet transfusion.  She had a possible transfusion reaction spiking a fever with sinus tachycardia therefore was recommended for admission for observation.  She continues to have significant weakness and fatigue and a poor appetite.  Previously was reported to have confusion, but none currently.  She has no neurologic complaints.  She has no chest pain, shortness of breath, cough, or hemoptysis.  She denies any nausea, vomiting, constipation, or diarrhea.  She has no urinary complaints.  Patient feels generally terrible, but offers no further specific complaints today.  REVIEW OF SYSTEMS:   Review of Systems  Constitutional: Positive for fever and malaise/fatigue. Negative for weight loss.  Respiratory: Negative.  Negative for cough, hemoptysis and shortness of breath.   Cardiovascular: Negative.  Negative for chest pain and leg swelling.  Gastrointestinal: Negative.  Negative for abdominal pain, blood in stool and melena.  Genitourinary: Negative.  Negative for dysuria.  Musculoskeletal: Negative.  Negative for back pain.  Skin: Negative.  Negative for rash.  Neurological: Positive for weakness. Negative for dizziness, focal weakness and headaches.  Psychiatric/Behavioral: Negative.  The patient is not nervous/anxious.     As per HPI. Otherwise, a complete review of systems is negative.  PAST MEDICAL HISTORY: Past Medical History:  Diagnosis Date  . Anemia     Myelodysplastic syndrome  . Arthritis   . Hypertension   . Leukemia, chronic lymphoid (HCC)    CLL  . Ovarian cancer ([place]) 1963    PAST SURGICAL HISTORY: Past Surgical History:  Procedure Laterality Date  . ABDOMINAL HYSTERECTOMY    . APPENDECTOMY    . BREAST SURGERY     cyst removed from right breast  . EYE SURGERY Bilateral    Cataract Extractions  . IR FLUORO GUIDE CV LINE RIGHT  06/30/2017  . REVERSE SHOULDER ARTHROPLASTY Right 10/28/2014   Procedure: REVERSE SHOULDER ARTHROPLASTY;  Surgeon: Corky Mull, MD;  Location: [facility] ORS;  Service: Orthopedics;  Laterality: Right;    FAMILY HISTORY: History reviewed. No pertinent family history.  ADVANCED DIRECTIVES (Y/N):  @ADVDIR @  HEALTH MAINTENANCE: Social History   Tobacco Use  . Smoking status: Former Smoker    Packs/day: 2.00    Types: Cigarettes    Quit date: 1963    Years since quitting: 59.0  . Smokeless tobacco: Never Used  Vaping Use  . Vaping Use: Never used  Substance Use Topics  . Alcohol use: Not Currently    Alcohol/week: 1.0 standard drink    Types: 1 Glasses of wine per week    Comment: 1 glass of wine daily  . Drug use: No     Colonoscopy:  PAP:  Bone density:  Lipid panel:  Allergies  Allergen Reactions  . Ciprofloxacin Nausea Only    Current Facility-Administered Medications  Medication Dose Route Frequency Provider Last Rate Last Admin  . 0.9 %  sodium chloride infusion (Manually program via Guardrails IV Fluids)   Intravenous Once Shawna Clamp, MD      . 0.9 %  sodium chloride infusion (Manually program via  Guardrails IV Fluids)   Intravenous Once Shawna Clamp, MD      . 0.9 %  sodium chloride infusion (Manually program via Guardrails IV Fluids)   Intravenous Once Shawna Clamp, MD      . 0.9 %  sodium chloride infusion  10 mL/hr Intravenous Once Shawna Clamp, MD      . acetaminophen (TYLENOL) tablet 650 mg  650 mg Oral Q6H PRN Cox, Amy N, DO   650 mg at 04/04/20 0800  .  ALPRAZolam Duanne Moron) tablet 0.25 mg  0.25 mg Oral Daily PRN Shawna Clamp, MD   0.25 mg at 04/06/20 0119  . amLODipine (NORVASC) tablet 5 mg  5 mg Oral Daily Shawna Clamp, MD   5 mg at 04/05/20 1211  . aspirin EC tablet 81 mg  81 mg Oral Daily Cox, Amy N, DO   81 mg at 04/06/20 1043  . diphenhydrAMINE (BENADRYL) injection 50 mg  50 mg Intravenous Q6H PRN Cox, Amy N, DO   50 mg at 04/04/20 1219  . HYDROcodone-acetaminophen (NORCO) 7.5-325 MG per tablet 1 tablet  1 tablet Oral Q8H PRN Cox, Amy N, DO   1 tablet at 04/04/20 0101  . mirtazapine (REMERON) tablet 7.5 mg  7.5 mg Oral QHS Shawna Clamp, MD   7.5 mg at 04/06/20 0004  . ondansetron (ZOFRAN) tablet 4 mg  4 mg Oral Q6H PRN Cox, Amy N, DO       Or  . ondansetron (ZOFRAN) injection 4 mg  4 mg Intravenous Q6H PRN Cox, Amy N, DO      . potassium chloride (KLOR-CON) packet 40 mEq  40 mEq Oral Once Shawna Clamp, MD       Current Outpatient Medications  Medication Sig Dispense Refill  . acetaminophen (TYLENOL) 325 MG tablet Take 2 tablets (650 mg total) by mouth every 6 (six) hours as needed for mild pain (or Fever >/= 101).    Marland Kitchen allopurinol (ZYLOPRIM) 300 MG tablet Take 1 tablet (300 mg total) by mouth daily. 30 tablet 0  . ALPRAZolam (XANAX) 0.25 MG tablet Take 1 tablet (0.25 mg total) by mouth daily as needed for anxiety. 5 tablet 0  . amLODipine (NORVASC) 5 MG tablet Take 1 tablet (5 mg total) by mouth daily.    Marland Kitchen aspirin 81 MG tablet Take 81 mg by mouth daily.    . cyanocobalamin (,VITAMIN B-12,) 1000 MCG/ML injection Inject into the muscle.    . diphenhydrAMINE (SOMINEX) 25 MG tablet Take 25 mg by mouth at bedtime.    . diphenoxylate-atropine (LOMOTIL) 2.5-0.025 MG tablet Take 2 tablets by mouth every 6 (six) hours as needed for diarrhea or loose stools. 60 tablet 0  . etodolac (LODINE) 400 MG tablet Take 400 mg by mouth daily.    . hydrALAZINE (APRESOLINE) 50 MG tablet Take 50 mg by mouth 2 (two) times daily.    Marland Kitchen  HYDROcodone-acetaminophen (NORCO/VICODIN) 5-325 MG tablet Take 1-2 tablets by mouth every 8 (eight) hours as needed for moderate pain or severe pain. 15 tablet 0  . hydroxyurea (HYDREA) 500 MG capsule Take 2 capsules (1,000 mg total) by mouth daily. May take with food to minimize GI side effects. 60 capsule 0  . lidocaine-prilocaine (EMLA) cream Apply 1 application topically as needed. Apply to port 1-2 hours prior to appointment. Cover with plastic wrap. 30 g 3  . loperamide (IMODIUM A-D) 2 MG tablet Take 1 tablet (2 mg total) by mouth 4 (four) times daily as needed for diarrhea or  loose stools. 30 tablet 0  . magic mouthwash SOLN Take 30 mLs by mouth 4 (four) times daily as needed for mouth pain.    . mirtazapine (REMERON) 7.5 MG tablet Take 1 tablet (7.5 mg total) by mouth at bedtime. 30 tablet 2  . Multiple Vitamins-Minerals (PRESERVISION AREDS 2) CAPS Take 1 capsule by mouth daily.    Marland Kitchen nystatin (MYCOSTATIN) 100000 UNIT/ML suspension Take 10 mLs by mouth 4 (four) times daily.    . potassium chloride SA (KLOR-CON) 20 MEQ tablet Take 1 tablet (20 mEq total) by mouth 2 (two) times daily. 30 tablet 0  . senna (SENOKOT) 8.6 MG TABS tablet Take 1 tablet by mouth 2 (two) times daily.    Marland Kitchen tiZANidine (ZANAFLEX) 2 MG tablet Take 1 tablet (2 mg total) by mouth at bedtime as needed for muscle spasms. 30 tablet 0   Facility-Administered Medications Ordered in Other Encounters  Medication Dose Route Frequency Provider Last Rate Last Admin  . heparin lock flush 100 unit/mL  500 Units Intravenous Once Lloyd Huger, MD      . sodium chloride flush (NS) 0.9 % injection 10 mL  10 mL Intravenous PRN Lloyd Huger, MD   10 mL at 09/05/18 0822    OBJECTIVE: Vitals:   04/06/20 1044 04/06/20 1300  BP: (!) 109/46 (!) 116/52  Pulse: 95 83  Resp: (!) 22 18  Temp:    SpO2: 98% 96%     Body mass index is 18.94 kg/m.    ECOG FS:4 - Bedbound  General: Thin, no acute distress. Eyes: Pink  conjunctiva, anicteric sclera. HEENT: Normocephalic, moist mucous membranes. Lungs: No audible wheezing or coughing. Heart: Regular rate and rhythm. Abdomen: Soft, nontender, no obvious distention. Musculoskeletal: No edema, cyanosis, or clubbing. Neuro: Alert, answering all questions appropriately. Cranial nerves grossly intact. Skin: No rashes or petechiae noted. Psych: Normal affect.  LAB RESULTS:  Lab Results  Component Value Date   NA 143 04/06/2020   K 3.0 (L) 04/06/2020   CL 107 04/06/2020   CO2 22 04/06/2020   GLUCOSE 113 (H) 04/06/2020   BUN 24 (H) 04/06/2020   CREATININE 0.58 04/06/2020   CALCIUM 9.0 04/06/2020   PROT 5.8 (L) 04/04/2020   ALBUMIN 3.0 (L) 04/04/2020   AST 22 04/04/2020   ALT 25 04/04/2020   ALKPHOS 73 04/04/2020   BILITOT 1.0 04/04/2020   GFRNONAA >60 04/06/2020   GFRAA >60 08/22/2019    Lab Results  Component Value Date   WBC 9.1 04/06/2020   NEUTROABS 0.8 (L) 04/04/2020   HGB 7.8 (L) 04/06/2020   HCT 23.3 (L) 04/06/2020   MCV 89.3 04/06/2020   PLT 11 (LL) 04/06/2020     STUDIES: DG Chest 1 View  Result Date: 03/20/2020 CLINICAL DATA:  Right arm pain, swelling, and redness. Sepsis and fever. EXAM: CHEST  1 VIEW COMPARISON:  10/28/2016 FINDINGS: Power port type central venous catheter with tip over the cavoatrial junction region. No pneumothorax. Shallow inspiration. Cardiac enlargement. No vascular congestion or edema. Peribronchial thickening and streaky perihilar opacities similar to prior study, likely chronic bronchitic change. Mild atelectasis in the lung bases. No pleural effusions. No pneumothorax. Calcification of the aorta. Degenerative changes in the spine and left shoulder. Postoperative change in the right shoulder. IMPRESSION: Cardiac enlargement. No evidence of active pulmonary disease. Electronically Signed   By: Lucienne Capers M.D.   On: 03/20/2020 16:43   DG Wrist Complete Right  Result Date: 03/20/2020 CLINICAL DATA:   Acute  right wrist pain and swelling. EXAM: RIGHT WRIST - COMPLETE 3+ VIEW COMPARISON:  None. FINDINGS: There is no evidence of fracture or dislocation. Severe narrowing of the first carpometacarpal joint is noted as well as mild narrowing of the radiocarpal joint. Calcification of the triangular fibrocartilage is noted. Soft tissues are unremarkable. IMPRESSION: Osteoarthritis is noted involving the first carpometacarpal joint and radiocarpal joint. No acute abnormality seen in the right wrist. Electronically Signed   By: Marijo Conception M.D.   On: 03/20/2020 10:15   CT HEAD WO CONTRAST  Result Date: 04/04/2020 CLINICAL DATA:  Headache.  History of lymphoma. EXAM: CT HEAD WITHOUT CONTRAST TECHNIQUE: Contiguous axial images were obtained from the base of the skull through the vertex without intravenous contrast. COMPARISON:  03/19/2020 FINDINGS: Brain: There is no mass, hemorrhage or extra-axial collection. There is generalized atrophy without lobar predilection. Hypodensity of the white matter is most commonly associated with chronic microvascular disease. Vascular: Atherosclerotic calcification of the vertebral and internal carotid arteries at the skull base. No abnormal hyperdensity of the major intracranial arteries or dural venous sinuses. Skull: The visualized skull base, calvarium and extracranial soft tissues are normal. Sinuses/Orbits: No fluid levels or advanced mucosal thickening of the visualized paranasal sinuses. No mastoid or middle ear effusion. The orbits are normal. IMPRESSION: 1. No acute intracranial abnormality. 2. Generalized atrophy and chronic microvascular ischemia. Electronically Signed   By: Ulyses Jarred M.D.   On: 04/04/2020 00:16   CT Head W Wo Contrast  Addendum Date: 03/19/2020   ADDENDUM REPORT: 03/19/2020 18:16 ADDENDUM: These results were called by telephone at the time of interpretation on 03/19/2020 at 6:14 pm to Beckey Rutter NP, who verbally acknowledged these results.  Electronically Signed   By: Pedro Earls M.D.   On: 03/19/2020 18:16   Result Date: 03/19/2020 CLINICAL DATA:  Change in mental status. 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 Fall. EXAM: CT HEAD WITHOUT AND WITH CONTRAST TECHNIQUE: Contiguous axial images were obtained from the base of the skull through the vertex without and with intravenous contrast CONTRAST:  81mL OMNIPAQUE IOHEXOL 300 MG/ML  SOLN COMPARISON:  Head CT October 20, 2012. FINDINGS: Brain: A 9 mm hypodense focus in the anterior right thalamus, may represent ischemia, age indeterminate. No focus of abnormal contrast enhancement. No hemorrhage, hydrocephalus, mass effect or extra-axial collection. Vascular: No hyperdense vessel prominent calcified plaques in the bilateral vertebral arteries and carotid siphons. Skull: Normal. Negative for fracture or focal lesion. Sinuses/Orbits: No acute finding. IMPRESSION: 1. Hypodense focus in the anterior right thalamus, may represent ischemia, age indeterminate. MRI could be used to further evaluate. 2. No focus of abnormal contrast enhancement. 3. Prominent calcified plaques in the bilateral vertebral arteries and carotid siphons. Electronically Signed: By: Pedro Earls M.D. On: 03/19/2020 17:30   CT ANGIO CHEST PE W OR WO CONTRAST  Result Date: 04/04/2020 CLINICAL DATA:  Concern for PE.  Shortness of breath. EXAM: CT ANGIOGRAPHY CHEST WITH CONTRAST TECHNIQUE: Multidetector CT imaging of the chest was performed using the standard protocol during bolus administration of intravenous contrast. Multiplanar CT image reconstructions and MIPs were obtained to evaluate the vascular anatomy. CONTRAST:  83mL OMNIPAQUE IOHEXOL 350 MG/ML SOLN COMPARISON:  CT dated June 09, 2014 FINDINGS: Cardiovascular: Contrast injection is sufficient to demonstrate satisfactory opacification of the pulmonary arteries to the segmental level. There is no pulmonary embolus or evidence of right heart  strain. The size of the main pulmonary artery is normal. Cardiomegaly with coronary  artery calcification. The course and caliber of the aorta are normal. There is mild atherosclerotic calcification. Opacification decreased due to pulmonary arterial phase contrast bolus timing. There is a right-sided central venous catheter with tip terminating near the cavoatrial junction. Mediastinum/Nodes: --mediastinal adenopathy is noted. For example there is a precarinal lymph node measuring approximately 1.7 cm. --hilar adenopathy is noted. -- No axillary lymphadenopathy. --there is left supraclavicular adenopathy. -- Normal thyroid gland where visualized. -  Unremarkable esophagus. Lungs/Pleura: There is a moderate to large left-sided pleural effusion with adjacent compressive atelectasis. There is no pneumothorax. The trachea is unremarkable. Upper Abdomen: Contrast bolus timing is not optimized for evaluation of the abdominal organs. There is significant splenomegaly of the partially visualized spleen. There is a right hepatic lobe cyst measuring approximately 4.8 cm. There is partially visualized intrahepatic biliary ductal dilatation. There may be some adenopathy at the level of the porta hepatis. There is moderate narrowing at the origin of the celiac axis. Musculoskeletal: There are old healed bilateral rib fractures. Review of the MIP images confirms the above findings. IMPRESSION: 1. There is no evidence for acute pulmonary embolus. 2. There is a moderate to large left-sided pleural effusion with adjacent compressive atelectasis. 3. There is mediastinal, hilar, and left supraclavicular adenopathy. There is significant splenomegaly of the partially visualized spleen. Findings are concerning for a lymphoproliferative disorder such as lymphoma. 4. There is partially visualized intrahepatic biliary ductal dilatation. Correlation with laboratory studies is recommended Aortic Atherosclerosis (ICD10-I70.0). Electronically  Signed   By: Constance Holster M.D.   On: 04/04/2020 05:44   CT HUMERUS RIGHT W CONTRAST  Result Date: 03/26/2020 CLINICAL DATA:  Right arm cellulitis follow up. EXAM: CT OF THE UPPER RIGHT EXTREMITY WITH CONTRAST TECHNIQUE: Multidetector CT imaging of the upper right extremity was performed according to the standard protocol following intravenous contrast administration. CONTRAST:  85mL OMNIPAQUE IOHEXOL 300 MG/ML  SOLN COMPARISON:  CT right humerus and forearm dated March 21, 2020. FINDINGS: Bones/Joint/Cartilage No fracture or dislocation. No bony destruction or periosteal reaction. No joint effusion. Prior right reverse total shoulder arthroplasty. No evidence of hardware failure or loosening. Unchanged intra-articular bodies in the subscapularis recess. Ligaments Ligaments are suboptimally evaluated by CT. Muscles and Tendons Grossly intact. Soft tissue Soft tissue swelling and skin thickening of the upper arm, most prominent medially, has mildly worsened and now extends further superiorly. Soft tissue swelling and skin thickening of the forearm has slightly improved. No fluid collection or subcutaneous emphysema. No soft tissue mass. Right chest wall port catheter. Small right pleural effusion. Hepatic cysts. IMPRESSION: 1. Worsening cellulitis of the upper arm. Mildly improved cellulitis of forearm. No abscess. 2. No acute osseous abnormality. 3. Small right pleural effusion. Electronically Signed   By: Titus Dubin M.D.   On: 03/26/2020 08:17   CT HUMERUS RIGHT W CONTRAST  Result Date: 03/21/2020 CLINICAL DATA:  Soft tissue swelling, elevated white count, infection suspected EXAM: CT OF THE RIGHT HUMERUS WITHOUT CONTRAST TECHNIQUE: Multidetector CT imaging was performed according to the standard protocol. Multiplanar CT image reconstructions were also generated. COMPARISON:  None. FINDINGS: Stranding noted within the subcutaneous soft tissues in the lower upper arm just above the elbow, the elbow  and upper forearm. This likely reflects cellulitis or edema. No drainable focal fluid collection. Changes of right shoulder replacement. No hardware complicating feature. No acute bony abnormality. IMPRESSION: Stranding within the subcutaneous soft tissues about the elbow suggesting cellulitis or edema. No focal fluid collection. Electronically Signed   By:  Rolm Baptise M.D.   On: 03/21/2020 10:58   CT FOREARM RIGHT W CONTRAST  Result Date: 03/26/2020 CLINICAL DATA:  Right arm cellulitis follow up. EXAM: CT OF THE UPPER RIGHT EXTREMITY WITH CONTRAST TECHNIQUE: Multidetector CT imaging of the upper right extremity was performed according to the standard protocol following intravenous contrast administration. CONTRAST:  80mL OMNIPAQUE IOHEXOL 300 MG/ML  SOLN COMPARISON:  CT right humerus and forearm dated March 21, 2020. FINDINGS: Bones/Joint/Cartilage No fracture or dislocation. No bony destruction or periosteal reaction. No joint effusion. Prior right reverse total shoulder arthroplasty. No evidence of hardware failure or loosening. Unchanged intra-articular bodies in the subscapularis recess. Ligaments Ligaments are suboptimally evaluated by CT. Muscles and Tendons Grossly intact. Soft tissue Soft tissue swelling and skin thickening of the upper arm, most prominent medially, has mildly worsened and now extends further superiorly. Soft tissue swelling and skin thickening of the forearm has slightly improved. No fluid collection or subcutaneous emphysema. No soft tissue mass. Right chest wall port catheter. Small right pleural effusion. Hepatic cysts. IMPRESSION: 1. Worsening cellulitis of the upper arm. Mildly improved cellulitis of forearm. No abscess. 2. No acute osseous abnormality. 3. Small right pleural effusion. Electronically Signed   By: Titus Dubin M.D.   On: 03/26/2020 08:17   CT FOREARM RIGHT W CONTRAST  Result Date: 03/21/2020 CLINICAL DATA:  Soft tissue infection suspected. Swelling, elevated  white count EXAM: CT OF THE RIGHT FOREARM WITH CONTRAST TECHNIQUE: Multidetector CT imaging was performed according to the standard protocol. Multiplanar CT image reconstructions were also generated. COMPARISON:  None. FINDINGS: Stranding within the subcutaneous soft tissues noted in the distal upper arm, elbow region, and forearm compatible with cellulitis or edema. No drainable focal fluid collection. No acute bony abnormality. IMPRESSION: Stranding within the subcutaneous soft tissues throughout the forearm suggesting cellulitis or edema. No acute bony abnormality or focal fluid collection. Electronically Signed   By: Rolm Baptise M.D.   On: 03/21/2020 11:00   CT HAND RIGHT W CONTRAST  Result Date: 03/21/2020 CLINICAL DATA:  Soft tissue infection suspected. Swelling. Elevated white blood cell count. EXAM: CT OF THE RIGHT HAND WITHOUT CONTRAST TECHNIQUE: Multidetector CT imaging of the right hand was performed according to the standard protocol. Multiplanar CT image reconstructions were also generated. COMPARISON:  None. FINDINGS: No focal fluid collection within the soft tissue to suggest abscess. Mild subcutaneous soft tissue stranding suggest cellulitis or edema. No acute bony abnormality. Degenerative changes in the wrist and IP joint. IMPRESSION: No acute bony abnormality.  No soft tissue drainable abscess. Electronically Signed   By: Rolm Baptise M.D.   On: 03/21/2020 10:56   US Venous Img Upper Uni Right(DVT)  Result Date: 03/20/2020 CLINICAL DATA:  Right arm swelling. Abrasion to the right wrist with possible infection yesterday. Worsening today. Patient is on chemotherapy for CLL. Pain and edema with color changes. Anticoagulation therapy. EXAM: Right UPPER EXTREMITY VENOUS DOPPLER ULTRASOUND TECHNIQUE: Gray-scale sonography with graded compression, as well as color Doppler and duplex ultrasound were performed to evaluate the upper extremity deep venous system from the level of the subclavian vein  and including the jugular, axillary, basilic, radial, ulnar and upper cephalic vein. Spectral Doppler was utilized to evaluate flow at rest and with distal augmentation maneuvers. COMPARISON:  None. FINDINGS: Contralateral Subclavian Vein: Respiratory phasicity is normal and symmetric with the symptomatic side. No evidence of thrombus. Normal compressibility. Internal Jugular Vein: No evidence of thrombus. Normal compressibility, respiratory phasicity and response to augmentation. Subclavian  Vein: No evidence of thrombus. Normal compressibility, respiratory phasicity and response to augmentation. Axillary Vein: No evidence of thrombus. Normal compressibility, respiratory phasicity and response to augmentation. Cephalic Vein: No evidence of thrombus. Normal compressibility, respiratory phasicity and response to augmentation. Basilic Vein: No evidence of thrombus. Normal compressibility, respiratory phasicity and response to augmentation. Brachial Veins: No evidence of thrombus. Normal compressibility, respiratory phasicity and response to augmentation. Radial Veins: No evidence of thrombus. Normal compressibility, respiratory phasicity and response to augmentation. Ulnar Veins: No evidence of thrombus. Normal compressibility, respiratory phasicity and response to augmentation. Venous Reflux:  None visualized. Other Findings: Contralateral left subclavian vein is also evaluated and is patent. IMPRESSION: No evidence of DVT within the right upper extremity. Electronically Signed   By: Lucienne Capers M.D.   On: 03/20/2020 16:41   DG Chest Portable 1 View  Result Date: 04/03/2020 CLINICAL DATA:  Abnormal hemoglobin and platelet counts. EXAM: PORTABLE CHEST 1 VIEW COMPARISON:  March 20, 2020 FINDINGS: There is stable right-sided venous Port-A-Cath positioning. Stable chronic appearing increased lung markings are seen. Mild to moderate severity atelectasis and/or infiltrate is noted within the retrocardiac region  of the left lung base. There is no evidence of a pleural effusion or pneumothorax. The cardiac silhouette is borderline in size. There is mild calcification of the aortic arch. Chronic right-sided rib fractures are noted. An intact right shoulder replacement is seen. IMPRESSION: Mild to moderate severity left basilar atelectasis and/or infiltrate. Electronically Signed   By: Virgina Norfolk M.D.   On: 04/03/2020 22:43   DG Hand Complete Right  Result Date: 03/20/2020 CLINICAL DATA:  Acute right hand pain and swelling. EXAM: RIGHT HAND - COMPLETE 3+ VIEW COMPARISON:  None. FINDINGS: There is no evidence of fracture or dislocation. Severe degenerative changes are seen involving the first carpometacarpal joint as well as the proximal and distal interphalangeal joints consistent with osteoarthritis. Soft tissues are unremarkable. IMPRESSION: Severe osteoarthritis is noted. No acute abnormality seen in the right hand. Electronically Signed   By: Marijo Conception M.D.   On: 03/20/2020 10:13   ECHOCARDIOGRAM COMPLETE  Result Date: 03/24/2020    ECHOCARDIOGRAM REPORT   Patient Name:   DEANINE VANDERBURGH Date of Exam: 03/23/2020 Medical Rec #:  [id number]   Height:       60.0 in Accession #:    [id number]  Weight:       97.0 lb Date of Birth:  [birth year]  BSA:          1.372 m Patient Age:    21 years    BP:           182/80 mmHg Patient Gender: F           HR:           110 bpm. Exam Location:  [facility] Procedure: 2D Echo, Cardiac Doppler and Color Doppler Indications:     Bacteremia R78.81  History:         Patient has no prior history of Echocardiogram examinations.                  Risk Factors:Hypertension.  Sonographer:     Alyse Low Roar Referring Phys:  [id number] Tsosie Billing Diagnosing Phys: Nelva Bush MD IMPRESSIONS  1. Left ventricular ejection fraction, by estimation, is 60 to 65%. The left ventricle has normal function. The left ventricle has no regional wall motion abnormalities. Left ventricular  diastolic parameters are consistent with Grade I diastolic dysfunction (impaired relaxation). Elevated left atrial  pressure.  2. Right ventricular systolic function is normal. The right ventricular size is normal. There is moderately elevated pulmonary artery systolic pressure.  3. Left atrial size was moderately dilated.  4. The pericardial effusion is posterior to the left ventricle.  5. The mitral valve is abnormal. Mild mitral valve regurgitation. Mild mitral stenosis. Moderate mitral annular calcification.  6. The aortic valve was not well visualized. There is mild calcification of the aortic valve. There is mild thickening of the aortic valve. Aortic valve regurgitation is not visualized. No aortic stenosis is present.  7. The inferior vena cava is dilated in size with <50% respiratory variability, suggesting right atrial pressure of 15 mmHg. FINDINGS  Left Ventricle: Left ventricular ejection fraction, by estimation, is 60 to 65%. The left ventricle has normal function. The left ventricle has no regional wall motion abnormalities. The left ventricular internal cavity size was normal in size. There is  borderline left ventricular hypertrophy. Left ventricular diastolic parameters are consistent with Grade I diastolic dysfunction (impaired relaxation). Elevated left atrial pressure. Right Ventricle: The right ventricular size is normal. No increase in right ventricular wall thickness. Right ventricular systolic function is normal. There is moderately elevated pulmonary artery systolic pressure. The tricuspid regurgitant velocity is 2.97 m/s, and with an assumed right atrial pressure of 15 mmHg, the estimated right ventricular systolic pressure is 0000000 mmHg. Left Atrium: Left atrial size was moderately dilated. Right Atrium: Right atrial size was normal in size. Pericardium: Trivial pericardial effusion is present. The pericardial effusion is posterior to the left ventricle. Mitral Valve: The mitral valve is  abnormal. There is mild thickening of the mitral valve leaflet(s). There is decreased mobility of the posterior leaflet. Moderate mitral annular calcification. Mild mitral valve regurgitation. Mild mitral valve stenosis. Tricuspid Valve: The tricuspid valve is not well visualized. Tricuspid valve regurgitation is mild. Aortic Valve: The aortic valve was not well visualized. There is mild calcification of the aortic valve. There is mild thickening of the aortic valve. Aortic valve regurgitation is not visualized. No aortic stenosis is present. Aortic valve peak gradient  measures 12.5 mmHg. Pulmonic Valve: The pulmonic valve was not well visualized. Pulmonic valve regurgitation is not visualized. No evidence of pulmonic stenosis. Aorta: The aortic root and ascending aorta are structurally normal, with no evidence of dilitation. Pulmonary Artery: The pulmonary artery is of normal size. Venous: The inferior vena cava is dilated in size with less than 50% respiratory variability, suggesting right atrial pressure of 15 mmHg. IAS/Shunts: No atrial level shunt detected by color flow Doppler.  LEFT VENTRICLE PLAX 2D LVIDd:         4.10 cm  Diastology LVIDs:         2.80 cm  LV e' medial:    4.03 cm/s LV PW:         0.90 cm  LV E/e' medial:  30.0 LV IVS:        0.99 cm  LV e' lateral:   3.48 cm/s LVOT diam:     1.70 cm  LV E/e' lateral: 34.8 LVOT Area:     2.27 cm  RIGHT VENTRICLE RV Mid diam:    3.40 cm RV S prime:     14.80 cm/s TAPSE (M-mode): 2.1 cm LEFT ATRIUM             Index       RIGHT ATRIUM           Index LA diam:  3.35 cm 2.44 cm/m  RA Area:     16.40 cm LA Vol (A2C):   74.3 ml 54.14 ml/m RA Volume:   41.70 ml  30.38 ml/m LA Vol (A4C):   66.6 ml 48.53 ml/m LA Biplane Vol: 70.7 ml 51.51 ml/m  AORTIC VALVE                PULMONIC VALVE AV Area (Vmax): 1.54 cm    PV Vmax:        0.90 m/s AV Vmax:        177.00 cm/s PV Peak grad:   3.2 mmHg AV Peak Grad:   12.5 mmHg   RVOT Peak grad: 1 mmHg LVOT Vmax:       120.00 cm/s  AORTA Ao Root diam: 2.35 cm Ao Asc diam:  2.50 cm MITRAL VALVE                TRICUSPID VALVE MV Area (PHT): 6.83 cm     TR Peak grad:   35.3 mmHg MV Decel Time: 111 msec     TR Vmax:        297.00 cm/s MV E velocity: 121.00 cm/s MV A velocity: 173.00 cm/s  SHUNTS MV E/A ratio:  0.70         Systemic Diam: 1.70 cm MV A Prime:    16.2 cm/s Nelva Bush MD Electronically signed by Nelva Bush MD Signature Date/Time: 03/24/2020/7:09:57 AM    Final     ASSESSMENT: Stage IV mantle cell carcinoma, thrombocytopenia, anemia.  PLAN:    1.  Stage IV mantle cell carcinoma: Patient last received treatment with Bendamustine and Rituxan on December 27 and 28, 2021.  Plan was a 28-day cycle with her next treatment occurring next week.  Lymphadenopathy and splenomegaly seen on recent CT scan can be attributed to her lymphoma.  Her significant anemia and thrombocytopenia are likely multifactorial related to her lymphoma as well as her chemotherapy 3 weeks ago.  No intervention is needed at this time.  Patient has been instructed to keep follow-up appointment in clinic next week. 2.  Thrombocytopenia: Multifactorial secondary to lymphoma and chemotherapy several weeks ago.  Platelet count is 11 today.  Agree with transfusion.  Continue to monitor daily CBC and transfuse to maintain platelet count greater than 10,000.  Okay to introduce daily steroids. 3.  Anemia: Patient's hemoglobin is improved to 7.8 with transfusion.  Have recommended 1 additional unit packed red blood cells today to improve her hemoglobin greater than 8.0. 4.  Possible transfusion reaction: Proceed with both platelet and blood as above.  Recommend premedication with Tylenol, Benadryl, and Solu-Medrol. 5.  Confusion: Resolved. CT head is negative.  Appreciate consult, will follow.  Lloyd Huger, MD   04/06/2020 3:36 PM

## 2020-04-06 NOTE — ED Notes (Signed)
Mess age sent to receiving RN. Awaiting reply.  

## 2020-04-06 NOTE — ED Notes (Addendum)
Pt resting in position of comfort with son at bedside, nopa cute s/s of distress noted. A/o x4, rr even/unlabored, clearn bilaterally to auscultation. Port noted to left chest wall, flushes well.  abd soft/flat/nontender, skin cool/dry/pink, +pmsc x4, VSS. Updated on plan of care/verbalized understanding. awaitng release of blood products.  Will continue to monitor/reasseess

## 2020-04-06 NOTE — ED Notes (Signed)
Randol Kern, NP sent secure message of lab values including platelets of 11, Hgb of 7.8, and potassium of 3.0. Pt resting at this time. VSS. Awaiting further orders. Will continue to monitor.

## 2020-04-06 NOTE — Progress Notes (Signed)
   04/06/20 2230  Vitals  Temp (!) 100.4 F (38 C)  Temp Source Oral  BP (!) 117/50  MAP (mmHg) 69  BP Location Left Arm  BP Method Automatic  Patient Position (if appropriate) Lying  Pulse Rate (!) 109  Resp (!) 28  Level of Consciousness  Level of Consciousness Alert  Oxygen Therapy  SpO2 97 %  O2 Device Room Air   Patient is refusing the blood transfusion due to the reaction. Notified B. Randol Kern NP and she indicated to hold the blood for tonight. Temp came down to 100.4 after the Tylenol. Will continue to monitor.

## 2020-04-06 NOTE — ED Notes (Signed)
RN on phone with blood bank. Blood bank stating platelets will expire tonight. This RN unable to get repeat lab work. Lab called to get repeat labs

## 2020-04-06 NOTE — ED Notes (Addendum)
Took over care of pt. Was told by previous RN that pt is refusing blood products in report. Pt resting comfortably. Pt in NAD at this time. VSS. Awaiting further orders. Will continue to monitor.

## 2020-04-06 NOTE — Progress Notes (Signed)
   04/06/20 2048  Vitals  Temp (!) 101 F (38.3 C)  Temp Source Oral  BP (!) 178/60  MAP (mmHg) 94  BP Method Automatic  Pulse Rate (!) 117  Pulse Rate Source Monitor  MEWS COLOR  MEWS Score Color Yellow  Oxygen Therapy  SpO2 99 %  O2 Device Room Air  MEWS Score  MEWS Temp 1  MEWS Systolic 0  MEWS Pulse 2  MEWS RR 0  MEWS LOC 0  MEWS Score 3   The patient is admitted to 2 A 231. A & O x 4. With some forgetful. Received patient who was shivering.ST and fever of 101 after platelets infusion per report from ED RN. Tylenol 650 mg oral PRN administered and will recheck later.

## 2020-04-06 NOTE — Progress Notes (Addendum)
PROGRESS NOTE    Modelle Vollmer  DJS:[id number] DOB: [birth year] DOA: 04/03/2020 PCP: [name], MD   Brief Narrative:  This 87 years old female with PMH significant for mantle cell lymphoma stage IV, history of MDS, CLL presents to the emergency department from oncology clinic for low platelet counts.  Patient reports she was called to have 1 unit of platelet transfusion in the clinic,  approximately 1 hour into the transfusion she has developed fever and sinus tachycardia.  She denies any chest pain, shortness of breath, vomiting, abdominal pain.  She is admitted for possible transfusion reaction.  Assessment & Plan:   Principal Problem:   Thrombocytopenia ([place]) Active Problems:   Benign essential hypertension   Anxiety   E coli bacteremia   Transfusion reaction  Fever and sinus tachycardia possible transfusion reaction: Patient has developed fever and sinus tachycardia while having platelet transfusion. Oncology Dr. Tasia Catchings recommends admission and to contact blood bank and send phlebotomist for transfusion reaction work-up Blood bank has been contacted by ED provider and will send a phlebotomist for transfusion reaction work-up Blood cultures no growth so far, urine cultures no growth so far.  UA unremarkable. CTA chest negative for acute PE,  There is a moderate to large left-sided pleural effusion with adjacent compressive atelectasis. There is mediastinal, hilar, and left supraclavicular adenopathy. There is significant splenomegaly of the partially visualized spleen. Findings are concerning for a lymphoproliferative disorder, such as lymphoma. There is no source of infection found.  Chest x-ray shows infiltrate but COVID test and influenza negative. She remained afebrile since admitted in the hospital.  Thrombocytopenia- - Patient received partial unit of platelet. - Repeat CBC shows platelet count 24 trended down, no active bleeding - If platelets are not appropriately  increased, we will consult oncology for utilization of steroids. - Heme oncology consulted,  awaiting recommendation. - Pletelet count 11.0, no bleeding noted. - She might require platelet transfusion,  awaiting hematology recommendation.  Normochromic normocytic anemia: This could be secondary to lymphoproliferative disorder. Patient came with a hemoglobin of 5.5, transfuse 2 PRBC. Refused to have blood transfusion.   After explaining in detail he finally agreed to get 1 unit PRBC. Posttransfusion CBC hemoglobin 8.0 >> 7.8  New headache - Resolved. - CT of the head without contrast negative for any acute abnormality.   Hx. Of  E. coli bacteremia-she was discharged home with IV ertapenem 1 g daily - Patient completed last dose on 04/03/2020.  Chart reviewed.   Hospitalization from 03/20/2020 to 03/26/2020 for sepsis secondary to E. coli bacteremia suspect secondary to right upper extremity cellulitis. she was treated with empiric antibiotics.  CT of the right upper extremity did not show any evidence of abscess, bony involvement, or necrotizing fasciitis.  Blood cultures was positive for E. coli.  ID specialist and general surgeon were consulted to assist with management.  ID recommended IV ertapenem 1 g daily for 8 days at discharge.  She completed antibiotics on 04/03/2020    DVT prophylaxis: SCDs  code Status: DNR Family Communication: No family at bedside  Disposition Plan:  Status is: Inpatient  Remains inpatient appropriate because:Inpatient level of care appropriate due to severity of illness   Dispo: The patient is from: Home              Anticipated d/c is to: Home              Anticipated d/c date is: 3 days  Patient currently is not medically stable to d/c.  Consultants:   Oncology  Procedures:  None Antimicrobials:   Anti-infectives (From admission, onward)   None     Subjective: Patient was seen and examined at bedside.  Overnight events  noted.  Patient was seen lying in the ER hallway.  She seems to be more alert,  Awake and following commands in the morning.  She denies any chest pain or shortness of breath or dizziness. Objective: Vitals:   04/06/20 0654 04/06/20 1044 04/06/20 1044 04/06/20 1300  BP: (!) 127/54 (!) 109/46 (!) 109/46 (!) 116/52  Pulse: 88  95 83  Resp: 18  (!) 22 18  Temp:      TempSrc:      SpO2: 97%  98% 96%  Weight:      Height:       No intake or output data in the 24 hours ending 04/06/20 1425 Filed Weights   04/03/20 1503  Weight: 44 kg    Examination:  General exam: Appears calm and comfortable, not in any acute distress Respiratory system: Clear to auscultation. Respiratory effort normal. Cardiovascular system: S1 & S2 heard, RRR. No JVD, murmurs, rubs, gallops or clicks. No pedal edema. Gastrointestinal system: Abdomen is nondistended, soft and nontender. No organomegaly or masses felt. Normal bowel sounds heard. Central nervous system: Alert and oriented. No focal neurological deficits. Extremities: Symmetric 5 x 5 power. Skin: No rashes, lesions or ulcers Psychiatry: Judgement and insight appear normal. Mood & affect appropriate.     Data Reviewed: I have personally reviewed following labs and imaging studies  CBC: Recent Labs  Lab 04/03/20 0928 04/04/20 0107 04/04/20 0621 04/05/20 0506 04/06/20 0419  WBC 11.7* 7.8 8.9 9.5 9.1  NEUTROABS 0.9* 0.8*  --   --   --   HGB 7.2* 5.5* 5.7* 8.0* 7.8*  HCT 22.2* 17.3* 17.3* 23.7* 23.3*  MCV 93.3 93.0 92.0 88.4 89.3  PLT 8* 29* 24* 18* 11*   Basic Metabolic Panel: Recent Labs  Lab 04/04/20 0107 04/04/20 0621 04/05/20 0506 04/06/20 0419  NA 139 140 141 143  K 3.9 3.5 3.2* 3.0*  CL 106 108 107 107  CO2 22 21* 21* 22  GLUCOSE 133* 111* 108* 113*  BUN 15 15 12  24*  CREATININE 0.62 0.60 0.57 0.58  CALCIUM 8.7* 8.6* 9.1 9.0  MG  --   --  1.8 1.9  PHOS  --   --  2.7 3.3   GFR: Estimated Creatinine Clearance: 33.8 mL/min  (by C-G formula based on SCr of 0.58 mg/dL). Liver Function Tests: Recent Labs  Lab 04/04/20 0107  AST 22  ALT 25  ALKPHOS 73  BILITOT 1.0  PROT 5.8*  ALBUMIN 3.0*   No results for input(s): LIPASE, AMYLASE in the last 168 hours. No results for input(s): AMMONIA in the last 168 hours. Coagulation Profile: No results for input(s): INR, PROTIME in the last 168 hours. Cardiac Enzymes: No results for input(s): CKTOTAL, CKMB, CKMBINDEX, TROPONINI in the last 168 hours. BNP (last 3 results) No results for input(s): PROBNP in the last 8760 hours. HbA1C: No results for input(s): HGBA1C in the last 72 hours. CBG: No results for input(s): GLUCAP in the last 168 hours. Lipid Profile: No results for input(s): CHOL, HDL, LDLCALC, TRIG, CHOLHDL, LDLDIRECT in the last 72 hours. Thyroid Function Tests: Recent Labs    04/04/20 0107  TSH 4.784*   Anemia Panel: Recent Labs    04/05/20 0506  RETICCTPCT 0.5  Sepsis Labs: No results for input(s): PROCALCITON, LATICACIDVEN in the last 168 hours.  Recent Results (from the past 240 hour(s))  Urine Culture     Status: Abnormal   Collection Time: 04/03/20 10:28 AM   Specimen: Urine, Clean Catch  Result Value Ref Range Status   Specimen Description   Final    URINE, CLEAN CATCH Performed at [facility], [address] [place], [place] [address]    Special Requests   Final    NONE Performed at [facility], [address] [place], [place] [address]    Culture MULTIPLE SPECIES PRESENT, SUGGEST RECOLLECTION (A)  Final   Report Status 04/04/2020 FINAL  Final  Culture, blood (routine x 2)     Status: None (Preliminary result)   Collection Time: 04/03/20 10:49 PM   Specimen: BLOOD  Result Value Ref Range Status   Specimen Description BLOOD LEFT ANTECUBITAL  Final   Special Requests   Final    BOTTLES DRAWN AEROBIC AND ANAEROBIC Blood Culture results may not be optimal due to an excessive volume of blood received in  culture bottles   Culture   Final    NO GROWTH 3 DAYS Performed at [facility], [address] [place], [place] [address]    Report Status PENDING  Incomplete  Culture, blood (routine x 2)     Status: None (Preliminary result)   Collection Time: 04/03/20 10:57 PM   Specimen: BLOOD  Result Value Ref Range Status   Specimen Description BLOOD BLOOD LEFT FOREARM  Final   Special Requests   Final    BOTTLES DRAWN AEROBIC AND ANAEROBIC Blood Culture results may not be optimal due to an excessive volume of blood received in culture bottles   Culture   Final    NO GROWTH 3 DAYS Performed at [facility], [address], [place], [place] [address]    Report Status PENDING  Incomplete  Resp Panel by RT-PCR (Flu A&B, Covid) Nasopharyngeal Swab     Status: None   Collection Time: 04/03/20 11:15 PM   Specimen: Nasopharyngeal Swab; Nasopharyngeal(NP) swabs in vial transport medium  Result Value Ref Range Status   SARS Coronavirus 2 by RT PCR NEGATIVE NEGATIVE Final    Comment: (NOTE) SARS-CoV-2 target nucleic acids are NOT DETECTED.  The SARS-CoV-2 RNA is generally detectable in upper respiratory specimens during the acute phase of infection. The lowest concentration of SARS-CoV-2 viral copies this assay can detect is 138 copies/mL. A negative result does not preclude SARS-Cov-2 infection and should not be used as the sole basis for treatment or other patient management decisions. A negative result may occur with  improper specimen collection/handling, submission of specimen other than nasopharyngeal swab, presence of viral mutation(s) within the areas targeted by this assay, and inadequate number of viral copies(<138 copies/mL). A negative result must be combined with clinical observations, patient history, and epidemiological information. The expected result is Negative.  Fact Sheet for Patients:  EntrepreneurPulse.com.au  Fact Sheet for Healthcare  Providers:  IncredibleEmployment.be  This test is no t yet approved or cleared by the [place] FDA and  has been authorized for detection and/or diagnosis of SARS-CoV-2 by FDA under an Emergency Use Authorization (EUA). This EUA will remain  in effect (meaning this test can be used) for the duration of the COVID-19 declaration under Section 564(b)(1) of the Act, 21 U.S.C.section 360bbb-3(b)(1), unless the authorization is terminated  or revoked sooner.       Influenza A  by PCR NEGATIVE NEGATIVE Final   Influenza B by PCR NEGATIVE NEGATIVE Final    Comment: (NOTE) The Xpert Xpress SARS-CoV-2/FLU/RSV plus assay is intended as an aid in the diagnosis of influenza from Nasopharyngeal swab specimens and should not be used as a sole basis for treatment. Nasal washings and aspirates are unacceptable for Xpert Xpress SARS-CoV-2/FLU/RSV testing.  Fact Sheet for Patients: EntrepreneurPulse.com.au  Fact Sheet for Healthcare Providers: IncredibleEmployment.be  This test is not yet approved or cleared by the [place] FDA and has been authorized for detection and/or diagnosis of SARS-CoV-2 by FDA under an Emergency Use Authorization (EUA). This EUA will remain in effect (meaning this test can be used) for the duration of the COVID-19 declaration under Section 564(b)(1) of the Act, 21 U.S.C. section 360bbb-3(b)(1), unless the authorization is terminated or revoked.  Performed at [facility], [address], [place], [place] [address]   Urine culture     Status: Abnormal   Collection Time: 04/03/20 11:59 PM   Specimen: Urine, Random  Result Value Ref Range Status   Specimen Description   Final    URINE, RANDOM Performed at [facility], [address], [place], [place] [address]    Special Requests   Final    NONE Performed at [facility], [address] [place], [place] [address]     Culture (A)  Final    <10,000 COLONIES/mL INSIGNIFICANT GROWTH Performed at [facility], [place] [address], [place],  [address]    Report Status 04/05/2020 FINAL  Final     Radiology Studies: No results found.  Scheduled Meds: . sodium chloride   Intravenous Once  . amLODipine  5 mg Oral Daily  . aspirin EC  81 mg Oral Daily  . mirtazapine  7.5 mg Oral QHS   Continuous Infusions: . sodium chloride       LOS: 2 days    Time spent: 25 mins.    Shawna Clamp, MD [facility]   If 7PM-7AM, please contact night-coverage

## 2020-04-07 DIAGNOSIS — Z515 Encounter for palliative care: Secondary | ICD-10-CM

## 2020-04-07 LAB — CBC
HCT: 20.3 % — ABNORMAL LOW (ref 36.0–46.0)
Hemoglobin: 6.7 g/dL — ABNORMAL LOW (ref 12.0–15.0)
MCH: 29.9 pg (ref 26.0–34.0)
MCHC: 33 g/dL (ref 30.0–36.0)
MCV: 90.6 fL (ref 80.0–100.0)
Platelets: 21 10*3/uL — CL (ref 150–400)
RBC: 2.24 MIL/uL — ABNORMAL LOW (ref 3.87–5.11)
RDW: 15.6 % — ABNORMAL HIGH (ref 11.5–15.5)
WBC: 9.9 10*3/uL (ref 4.0–10.5)
nRBC: 0 % (ref 0.0–0.2)

## 2020-04-07 LAB — PREPARE PLATELET PHERESIS: Unit division: 0

## 2020-04-07 LAB — BASIC METABOLIC PANEL
Anion gap: 13 (ref 5–15)
BUN: 39 mg/dL — ABNORMAL HIGH (ref 8–23)
CO2: 21 mmol/L — ABNORMAL LOW (ref 22–32)
Calcium: 8.9 mg/dL (ref 8.9–10.3)
Chloride: 107 mmol/L (ref 98–111)
Creatinine, Ser: 0.72 mg/dL (ref 0.44–1.00)
GFR, Estimated: >60 mL/min (ref >60)
Glucose, Bld: 128 mg/dL — ABNORMAL HIGH (ref 70–99)
Potassium: 3.8 mmol/L (ref 3.5–5.1)
Sodium: 141 mmol/L (ref 135–145)

## 2020-04-07 LAB — PREPARE RBC (CROSSMATCH)

## 2020-04-07 LAB — BPAM PLATELET PHERESIS
Blood Product Expiration Date: 202201172359
ISSUE DATE / TIME: 202201171639
Unit Type and Rh: 5100

## 2020-04-07 MED ORDER — METHYLPREDNISOLONE SODIUM SUCC 125 MG IJ SOLR
125.0000 mg | Freq: Once | INTRAMUSCULAR | Status: AC
  Administered 2020-04-07: 125 mg via INTRAVENOUS
  Filled 2020-04-07: qty 2

## 2020-04-07 MED ORDER — BOOST / RESOURCE BREEZE PO LIQD CUSTOM
1.0000 | Freq: Three times a day (TID) | ORAL | Status: DC
  Administered 2020-04-07 – 2020-04-10 (×6): 1 via ORAL

## 2020-04-07 MED ORDER — SODIUM CHLORIDE 0.9% IV SOLUTION: Freq: Once | INTRAVENOUS | Status: AC

## 2020-04-07 MED ORDER — ADULT MULTIVITAMIN W/MINERALS CH
1.0000 | ORAL_TABLET | Freq: Every day | ORAL | Status: DC
  Administered 2020-04-08 – 2020-04-10 (×3): 1 via ORAL
  Filled 2020-04-07 (×3): qty 1

## 2020-04-07 MED ORDER — DIPHENHYDRAMINE HCL 50 MG/ML IJ SOLN
25.0000 mg | Freq: Once | INTRAMUSCULAR | Status: AC
  Administered 2020-04-07: 25 mg via INTRAVENOUS
  Filled 2020-04-07: qty 1

## 2020-04-07 MED ORDER — BENZOCAINE 10 % MT GEL
Freq: Two times a day (BID) | OROMUCOSAL | Status: DC | PRN
  Filled 2020-04-07: qty 9

## 2020-04-07 MED ORDER — DEXAMETHASONE 4 MG PO TABS
4.0000 mg | ORAL_TABLET | Freq: Every day | ORAL | Status: DC
  Administered 2020-04-07 – 2020-04-10 (×4): 4 mg via ORAL
  Filled 2020-04-07 (×4): qty 1

## 2020-04-07 MED ORDER — ACETAMINOPHEN 325 MG PO TABS
650.0000 mg | ORAL_TABLET | Freq: Once | ORAL | Status: AC
  Administered 2020-04-07: 650 mg via ORAL
  Filled 2020-04-07: qty 2

## 2020-04-07 NOTE — Progress Notes (Signed)
In and out cath  done by [facility] RN and Marcie Bal RN and obtained 600 mL.

## 2020-04-07 NOTE — Care Management Important Message (Signed)
Important Message  Patient Details  Name: Jacqueline Russo MRN: [id number] Date of Birth: 12-09-1929   Medicare Important Message Given:  Yes  Reviewed with patient via room phone due to isolation status.  Upon request, copy of Medicare IM placed in mail to patient's home address.   Dannette Barbara 04/07/2020, 1:42 PM

## 2020-04-07 NOTE — Progress Notes (Signed)
Jacqueline Russo  Telephone:([phone]) [phone] [phone]) [phone]  ID: Jacqueline Russo OB: 1930-01-11  MR#: [id number]  [id number]  Patient Care Team: [name], MD as PCP - General (Internal Medicine) Lloyd Huger, MD as Consulting Physician (Hematology and Oncology)  CHIEF COMPLAINT: Stage IV mantle cell carcinoma, thrombocytopenia, anemia.  INTERVAL HISTORY: Patient continues to have weakness and fatigue and a poor appetite.  She has a dry mouth.  She is anxious to be discharged to go home.  REVIEW OF SYSTEMS:   Review of Systems  Constitutional: Positive for malaise/fatigue. Negative for fever and weight loss.  Respiratory: Negative.  Negative for cough, hemoptysis and shortness of breath.   Cardiovascular: Negative.  Negative for chest pain and leg swelling.  Gastrointestinal: Negative.  Negative for abdominal pain.  Genitourinary: Negative.  Negative for dysuria.  Musculoskeletal: Negative.  Negative for back pain.  Skin: Negative.  Negative for rash.  Neurological: Positive for weakness. Negative for dizziness, focal weakness and headaches.  Psychiatric/Behavioral: Negative.  The patient is not nervous/anxious.     As per HPI. Otherwise, a complete review of systems is negative.  PAST MEDICAL HISTORY: Past Medical History:  Diagnosis Date  . Anemia    Myelodysplastic syndrome  . Arthritis   . Hypertension   . Leukemia, chronic lymphoid (HCC)    CLL  . Ovarian cancer ([place]) 1963    PAST SURGICAL HISTORY: Past Surgical History:  Procedure Laterality Date  . ABDOMINAL HYSTERECTOMY    . APPENDECTOMY    . BREAST SURGERY     cyst removed from right breast  . EYE SURGERY Bilateral    Cataract Extractions  . IR FLUORO GUIDE CV LINE RIGHT  06/30/2017  . REVERSE SHOULDER ARTHROPLASTY Right 10/28/2014   Procedure: REVERSE SHOULDER ARTHROPLASTY;  Surgeon: Corky Mull, MD;  Location: [facility] ORS;  Service: Orthopedics;  Laterality: Right;    FAMILY  HISTORY: History reviewed. No pertinent family history.  ADVANCED DIRECTIVES (Y/N):  @ADVDIR @  HEALTH MAINTENANCE: Social History   Tobacco Use  . Smoking status: Former Smoker    Packs/day: 2.00    Types: Cigarettes    Quit date: 1963    Years since quitting: 59.0  . Smokeless tobacco: Never Used  Vaping Use  . Vaping Use: Never used  Substance Use Topics  . Alcohol use: Not Currently    Alcohol/week: 1.0 standard drink    Types: 1 Glasses of wine per week    Comment: 1 glass of wine daily  . Drug use: No     Colonoscopy:  PAP:  Bone density:  Lipid panel:  Allergies  Allergen Reactions  . Chlorhexidine   . Ciprofloxacin Nausea Only    Current Facility-Administered Medications  Medication Dose Route Frequency Provider Last Rate Last Admin  . 0.9 %  sodium chloride infusion (Manually program via Guardrails IV Fluids)   Intravenous Once Shawna Clamp, MD      . 0.9 %  sodium chloride infusion (Manually program via Guardrails IV Fluids)   Intravenous Once Shawna Clamp, MD      . 0.9 %  sodium chloride infusion  10 mL/hr Intravenous Once Shawna Clamp, MD      . acetaminophen (TYLENOL) tablet 650 mg  650 mg Oral Q6H PRN Cox, Amy N, DO   650 mg at 04/06/20 2135  . ALPRAZolam Duanne Moron) tablet 0.25 mg  0.25 mg Oral Daily PRN Shawna Clamp, MD   0.25 mg at 04/06/20 0119  . amLODipine (NORVASC) tablet 5  mg  5 mg Oral Daily Shawna Clamp, MD   5 mg at 04/07/20 1003  . aspirin EC tablet 81 mg  81 mg Oral Daily Cox, Amy N, DO   81 mg at 04/07/20 1003  . diphenhydrAMINE (BENADRYL) injection 50 mg  50 mg Intravenous Q6H PRN Cox, Amy N, DO   50 mg at 04/04/20 1219  . HYDROcodone-acetaminophen (NORCO) 7.5-325 MG per tablet 1 tablet  1 tablet Oral Q8H PRN Cox, Amy N, DO   1 tablet at 04/04/20 0101  . mirtazapine (REMERON) tablet 7.5 mg  7.5 mg Oral QHS Shawna Clamp, MD   7.5 mg at 04/06/20 2134  . ondansetron (ZOFRAN) tablet 4 mg  4 mg Oral Q6H PRN Cox, Amy N, DO       Or  .  ondansetron (ZOFRAN) injection 4 mg  4 mg Intravenous Q6H PRN Cox, Amy N, DO       Facility-Administered Medications Ordered in Other Encounters  Medication Dose Route Frequency Provider Last Rate Last Admin  . heparin lock flush 100 unit/mL  500 Units Intravenous Once Lloyd Huger, MD      . sodium chloride flush (NS) 0.9 % injection 10 mL  10 mL Intravenous PRN Lloyd Huger, MD   10 mL at 09/05/18 0822    OBJECTIVE: Vitals:   04/07/20 1039 04/07/20 1105  BP: (!) 123/55 (!) 106/47  Pulse: 94 88  Resp: 19 19  Temp: 98.7 F (37.1 C) 98.6 F (37 C)  SpO2: 100% 100%     Body mass index is 18.94 kg/m.    ECOG FS:3 - Symptomatic, >50% confined to bed  General: Thin, no acute distress. Eyes: Pink conjunctiva, anicteric sclera. HEENT: Normocephalic, dry mucous membranes. Lungs: No audible wheezing or coughing. Heart: Regular rate and rhythm. Abdomen: Soft, nontender, no obvious distention. Musculoskeletal: No edema, cyanosis, or clubbing. Neuro: Alert, answering all questions appropriately. Cranial nerves grossly intact. Skin: No rashes or petechiae noted. Psych: Normal affect.  LAB RESULTS:  Lab Results  Component Value Date   NA 141 04/07/2020   K 3.8 04/07/2020   CL 107 04/07/2020   CO2 21 (L) 04/07/2020   GLUCOSE 128 (H) 04/07/2020   BUN 39 (H) 04/07/2020   CREATININE 0.72 04/07/2020   CALCIUM 8.9 04/07/2020   PROT 5.8 (L) 04/04/2020   ALBUMIN 3.0 (L) 04/04/2020   AST 22 04/04/2020   ALT 25 04/04/2020   ALKPHOS 73 04/04/2020   BILITOT 1.0 04/04/2020   GFRNONAA >60 04/07/2020   GFRAA >60 08/22/2019    Lab Results  Component Value Date   WBC 9.9 04/07/2020   NEUTROABS 0.8 (L) 04/04/2020   HGB 6.7 (L) 04/07/2020   HCT 20.3 (L) 04/07/2020   MCV 90.6 04/07/2020   PLT 21 (LL) 04/07/2020     STUDIES: DG Chest 1 View  Result Date: 03/20/2020 CLINICAL DATA:  Right arm pain, swelling, and redness. Sepsis and fever. EXAM: CHEST  1 VIEW COMPARISON:   10/28/2016 FINDINGS: Power port type central venous catheter with tip over the cavoatrial junction region. No pneumothorax. Shallow inspiration. Cardiac enlargement. No vascular congestion or edema. Peribronchial thickening and streaky perihilar opacities similar to prior study, likely chronic bronchitic change. Mild atelectasis in the lung bases. No pleural effusions. No pneumothorax. Calcification of the aorta. Degenerative changes in the spine and left shoulder. Postoperative change in the right shoulder. IMPRESSION: Cardiac enlargement. No evidence of active pulmonary disease. Electronically Signed   By: Oren Beckmann.D.  On: 03/20/2020 16:43   DG Wrist Complete Right  Result Date: 03/20/2020 CLINICAL DATA:  Acute right wrist pain and swelling. EXAM: RIGHT WRIST - COMPLETE 3+ VIEW COMPARISON:  None. FINDINGS: There is no evidence of fracture or dislocation. Severe narrowing of the first carpometacarpal joint is noted as well as mild narrowing of the radiocarpal joint. Calcification of the triangular fibrocartilage is noted. Soft tissues are unremarkable. IMPRESSION: Osteoarthritis is noted involving the first carpometacarpal joint and radiocarpal joint. No acute abnormality seen in the right wrist. Electronically Signed   By: Marijo Conception M.D.   On: 03/20/2020 10:15   CT HEAD WO CONTRAST  Result Date: 04/04/2020 CLINICAL DATA:  Headache.  History of lymphoma. EXAM: CT HEAD WITHOUT CONTRAST TECHNIQUE: Contiguous axial images were obtained from the base of the skull through the vertex without intravenous contrast. COMPARISON:  03/19/2020 FINDINGS: Brain: There is no mass, hemorrhage or extra-axial collection. There is generalized atrophy without lobar predilection. Hypodensity of the white matter is most commonly associated with chronic microvascular disease. Vascular: Atherosclerotic calcification of the vertebral and internal carotid arteries at the skull base. No abnormal hyperdensity of the  major intracranial arteries or dural venous sinuses. Skull: The visualized skull base, calvarium and extracranial soft tissues are normal. Sinuses/Orbits: No fluid levels or advanced mucosal thickening of the visualized paranasal sinuses. No mastoid or middle ear effusion. The orbits are normal. IMPRESSION: 1. No acute intracranial abnormality. 2. Generalized atrophy and chronic microvascular ischemia. Electronically Signed   By: Ulyses Jarred M.D.   On: 04/04/2020 00:16   CT Head W Wo Contrast  Addendum Date: 03/19/2020   ADDENDUM REPORT: 03/19/2020 18:16 ADDENDUM: These results were called by telephone at the time of interpretation on 03/19/2020 at 6:14 pm to Beckey Rutter NP, who verbally acknowledged these results. Electronically Signed   By: Pedro Earls M.D.   On: 03/19/2020 18:16   Result Date: 03/19/2020 CLINICAL DATA:  Change in mental status. 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 Fall. EXAM: CT HEAD WITHOUT AND WITH CONTRAST TECHNIQUE: Contiguous axial images were obtained from the base of the skull through the vertex without and with intravenous contrast CONTRAST:  31mL OMNIPAQUE IOHEXOL 300 MG/ML  SOLN COMPARISON:  Head CT October 20, 2012. FINDINGS: Brain: A 9 mm hypodense focus in the anterior right thalamus, may represent ischemia, age indeterminate. No focus of abnormal contrast enhancement. No hemorrhage, hydrocephalus, mass effect or extra-axial collection. Vascular: No hyperdense vessel prominent calcified plaques in the bilateral vertebral arteries and carotid siphons. Skull: Normal. Negative for fracture or focal lesion. Sinuses/Orbits: No acute finding. IMPRESSION: 1. Hypodense focus in the anterior right thalamus, may represent ischemia, age indeterminate. MRI could be used to further evaluate. 2. No focus of abnormal contrast enhancement. 3. Prominent calcified plaques in the bilateral vertebral arteries and carotid siphons. Electronically Signed: By: Pedro Earls M.D. On: 03/19/2020 17:30   CT ANGIO CHEST PE W OR WO CONTRAST  Result Date: 04/04/2020 CLINICAL DATA:  Concern for PE.  Shortness of breath. EXAM: CT ANGIOGRAPHY CHEST WITH CONTRAST TECHNIQUE: Multidetector CT imaging of the chest was performed using the standard protocol during bolus administration of intravenous contrast. Multiplanar CT image reconstructions and MIPs were obtained to evaluate the vascular anatomy. CONTRAST:  39mL OMNIPAQUE IOHEXOL 350 MG/ML SOLN COMPARISON:  CT dated June 09, 2014 FINDINGS: Cardiovascular: Contrast injection is sufficient to demonstrate satisfactory opacification of the pulmonary arteries to the segmental level. There is no pulmonary embolus or  evidence of right heart strain. The size of the main pulmonary artery is normal. Cardiomegaly with coronary artery calcification. The course and caliber of the aorta are normal. There is mild atherosclerotic calcification. Opacification decreased due to pulmonary arterial phase contrast bolus timing. There is a right-sided central venous catheter with tip terminating near the cavoatrial junction. Mediastinum/Nodes: --mediastinal adenopathy is noted. For example there is a precarinal lymph node measuring approximately 1.7 cm. --hilar adenopathy is noted. -- No axillary lymphadenopathy. --there is left supraclavicular adenopathy. -- Normal thyroid gland where visualized. -  Unremarkable esophagus. Lungs/Pleura: There is a moderate to large left-sided pleural effusion with adjacent compressive atelectasis. There is no pneumothorax. The trachea is unremarkable. Upper Abdomen: Contrast bolus timing is not optimized for evaluation of the abdominal organs. There is significant splenomegaly of the partially visualized spleen. There is a right hepatic lobe cyst measuring approximately 4.8 cm. There is partially visualized intrahepatic biliary ductal dilatation. There may be some adenopathy at the level of the porta hepatis.  There is moderate narrowing at the origin of the celiac axis. Musculoskeletal: There are old healed bilateral rib fractures. Review of the MIP images confirms the above findings. IMPRESSION: 1. There is no evidence for acute pulmonary embolus. 2. There is a moderate to large left-sided pleural effusion with adjacent compressive atelectasis. 3. There is mediastinal, hilar, and left supraclavicular adenopathy. There is significant splenomegaly of the partially visualized spleen. Findings are concerning for a lymphoproliferative disorder such as lymphoma. 4. There is partially visualized intrahepatic biliary ductal dilatation. Correlation with laboratory studies is recommended Aortic Atherosclerosis (ICD10-I70.0). Electronically Signed   By: Constance Holster M.D.   On: 04/04/2020 05:44   CT HUMERUS RIGHT W CONTRAST  Result Date: 03/26/2020 CLINICAL DATA:  Right arm cellulitis follow up. EXAM: CT OF THE UPPER RIGHT EXTREMITY WITH CONTRAST TECHNIQUE: Multidetector CT imaging of the upper right extremity was performed according to the standard protocol following intravenous contrast administration. CONTRAST:  77mL OMNIPAQUE IOHEXOL 300 MG/ML  SOLN COMPARISON:  CT right humerus and forearm dated March 21, 2020. FINDINGS: Bones/Joint/Cartilage No fracture or dislocation. No bony destruction or periosteal reaction. No joint effusion. Prior right reverse total shoulder arthroplasty. No evidence of hardware failure or loosening. Unchanged intra-articular bodies in the subscapularis recess. Ligaments Ligaments are suboptimally evaluated by CT. Muscles and Tendons Grossly intact. Soft tissue Soft tissue swelling and skin thickening of the upper arm, most prominent medially, has mildly worsened and now extends further superiorly. Soft tissue swelling and skin thickening of the forearm has slightly improved. No fluid collection or subcutaneous emphysema. No soft tissue mass. Right chest wall port catheter. Small right  pleural effusion. Hepatic cysts. IMPRESSION: 1. Worsening cellulitis of the upper arm. Mildly improved cellulitis of forearm. No abscess. 2. No acute osseous abnormality. 3. Small right pleural effusion. Electronically Signed   By: Titus Dubin M.D.   On: 03/26/2020 08:17   CT HUMERUS RIGHT W CONTRAST  Result Date: 03/21/2020 CLINICAL DATA:  Soft tissue swelling, elevated white count, infection suspected EXAM: CT OF THE RIGHT HUMERUS WITHOUT CONTRAST TECHNIQUE: Multidetector CT imaging was performed according to the standard protocol. Multiplanar CT image reconstructions were also generated. COMPARISON:  None. FINDINGS: Stranding noted within the subcutaneous soft tissues in the lower upper arm just above the elbow, the elbow and upper forearm. This likely reflects cellulitis or edema. No drainable focal fluid collection. Changes of right shoulder replacement. No hardware complicating feature. No acute bony abnormality. IMPRESSION: Stranding within the subcutaneous soft  tissues about the elbow suggesting cellulitis or edema. No focal fluid collection. Electronically Signed   By: Rolm Baptise M.D.   On: 03/21/2020 10:58   CT FOREARM RIGHT W CONTRAST  Result Date: 03/26/2020 CLINICAL DATA:  Right arm cellulitis follow up. EXAM: CT OF THE UPPER RIGHT EXTREMITY WITH CONTRAST TECHNIQUE: Multidetector CT imaging of the upper right extremity was performed according to the standard protocol following intravenous contrast administration. CONTRAST:  51mL OMNIPAQUE IOHEXOL 300 MG/ML  SOLN COMPARISON:  CT right humerus and forearm dated March 21, 2020. FINDINGS: Bones/Joint/Cartilage No fracture or dislocation. No bony destruction or periosteal reaction. No joint effusion. Prior right reverse total shoulder arthroplasty. No evidence of hardware failure or loosening. Unchanged intra-articular bodies in the subscapularis recess. Ligaments Ligaments are suboptimally evaluated by CT. Muscles and Tendons Grossly intact.  Soft tissue Soft tissue swelling and skin thickening of the upper arm, most prominent medially, has mildly worsened and now extends further superiorly. Soft tissue swelling and skin thickening of the forearm has slightly improved. No fluid collection or subcutaneous emphysema. No soft tissue mass. Right chest wall port catheter. Small right pleural effusion. Hepatic cysts. IMPRESSION: 1. Worsening cellulitis of the upper arm. Mildly improved cellulitis of forearm. No abscess. 2. No acute osseous abnormality. 3. Small right pleural effusion. Electronically Signed   By: Titus Dubin M.D.   On: 03/26/2020 08:17   CT FOREARM RIGHT W CONTRAST  Result Date: 03/21/2020 CLINICAL DATA:  Soft tissue infection suspected. Swelling, elevated white count EXAM: CT OF THE RIGHT FOREARM WITH CONTRAST TECHNIQUE: Multidetector CT imaging was performed according to the standard protocol. Multiplanar CT image reconstructions were also generated. COMPARISON:  None. FINDINGS: Stranding within the subcutaneous soft tissues noted in the distal upper arm, elbow region, and forearm compatible with cellulitis or edema. No drainable focal fluid collection. No acute bony abnormality. IMPRESSION: Stranding within the subcutaneous soft tissues throughout the forearm suggesting cellulitis or edema. No acute bony abnormality or focal fluid collection. Electronically Signed   By: Rolm Baptise M.D.   On: 03/21/2020 11:00   CT HAND RIGHT W CONTRAST  Result Date: 03/21/2020 CLINICAL DATA:  Soft tissue infection suspected. Swelling. Elevated white blood cell count. EXAM: CT OF THE RIGHT HAND WITHOUT CONTRAST TECHNIQUE: Multidetector CT imaging of the right hand was performed according to the standard protocol. Multiplanar CT image reconstructions were also generated. COMPARISON:  None. FINDINGS: No focal fluid collection within the soft tissue to suggest abscess. Mild subcutaneous soft tissue stranding suggest cellulitis or edema. No acute bony  abnormality. Degenerative changes in the wrist and IP joint. IMPRESSION: No acute bony abnormality.  No soft tissue drainable abscess. Electronically Signed   By: Rolm Baptise M.D.   On: 03/21/2020 10:56   US Venous Img Upper Uni Right(DVT)  Result Date: 03/20/2020 CLINICAL DATA:  Right arm swelling. Abrasion to the right wrist with possible infection yesterday. Worsening today. Patient is on chemotherapy for CLL. Pain and edema with color changes. Anticoagulation therapy. EXAM: Right UPPER EXTREMITY VENOUS DOPPLER ULTRASOUND TECHNIQUE: Gray-scale sonography with graded compression, as well as color Doppler and duplex ultrasound were performed to evaluate the upper extremity deep venous system from the level of the subclavian vein and including the jugular, axillary, basilic, radial, ulnar and upper cephalic vein. Spectral Doppler was utilized to evaluate flow at rest and with distal augmentation maneuvers. COMPARISON:  None. FINDINGS: Contralateral Subclavian Vein: Respiratory phasicity is normal and symmetric with the symptomatic side. No evidence of thrombus. Normal  compressibility. Internal Jugular Vein: No evidence of thrombus. Normal compressibility, respiratory phasicity and response to augmentation. Subclavian Vein: No evidence of thrombus. Normal compressibility, respiratory phasicity and response to augmentation. Axillary Vein: No evidence of thrombus. Normal compressibility, respiratory phasicity and response to augmentation. Cephalic Vein: No evidence of thrombus. Normal compressibility, respiratory phasicity and response to augmentation. Basilic Vein: No evidence of thrombus. Normal compressibility, respiratory phasicity and response to augmentation. Brachial Veins: No evidence of thrombus. Normal compressibility, respiratory phasicity and response to augmentation. Radial Veins: No evidence of thrombus. Normal compressibility, respiratory phasicity and response to augmentation. Ulnar Veins: No  evidence of thrombus. Normal compressibility, respiratory phasicity and response to augmentation. Venous Reflux:  None visualized. Other Findings: Contralateral left subclavian vein is also evaluated and is patent. IMPRESSION: No evidence of DVT within the right upper extremity. Electronically Signed   By: Lucienne Capers M.D.   On: 03/20/2020 16:41   DG Chest Portable 1 View  Result Date: 04/03/2020 CLINICAL DATA:  Abnormal hemoglobin and platelet counts. EXAM: PORTABLE CHEST 1 VIEW COMPARISON:  March 20, 2020 FINDINGS: There is stable right-sided venous Port-A-Cath positioning. Stable chronic appearing increased lung markings are seen. Mild to moderate severity atelectasis and/or infiltrate is noted within the retrocardiac region of the left lung base. There is no evidence of a pleural effusion or pneumothorax. The cardiac silhouette is borderline in size. There is mild calcification of the aortic arch. Chronic right-sided rib fractures are noted. An intact right shoulder replacement is seen. IMPRESSION: Mild to moderate severity left basilar atelectasis and/or infiltrate. Electronically Signed   By: Virgina Norfolk M.D.   On: 04/03/2020 22:43   DG Hand Complete Right  Result Date: 03/20/2020 CLINICAL DATA:  Acute right hand pain and swelling. EXAM: RIGHT HAND - COMPLETE 3+ VIEW COMPARISON:  None. FINDINGS: There is no evidence of fracture or dislocation. Severe degenerative changes are seen involving the first carpometacarpal joint as well as the proximal and distal interphalangeal joints consistent with osteoarthritis. Soft tissues are unremarkable. IMPRESSION: Severe osteoarthritis is noted. No acute abnormality seen in the right hand. Electronically Signed   By: Marijo Conception M.D.   On: 03/20/2020 10:13   ECHOCARDIOGRAM COMPLETE  Result Date: 03/24/2020    ECHOCARDIOGRAM REPORT   Patient Name:   Jacqueline Russo Date of Exam: 03/23/2020 Medical Rec #:  [id number]   Height:       60.0 in Accession  #:    [id number]  Weight:       97.0 lb Date of Birth:  1929-05-14  BSA:          1.372 m Patient Age:    36 years    BP:           182/80 mmHg Patient Gender: F           HR:           110 bpm. Exam Location:  [facility] Procedure: 2D Echo, Cardiac Doppler and Color Doppler Indications:     Bacteremia R78.81  History:         Patient has no prior history of Echocardiogram examinations.                  Risk Factors:Hypertension.  Sonographer:     Alyse Low Roar Referring Phys:  [id number] Tsosie Billing Diagnosing Phys: Nelva Bush MD IMPRESSIONS  1. Left ventricular ejection fraction, by estimation, is 60 to 65%. The left ventricle has normal function. The left ventricle has no regional wall motion  abnormalities. Left ventricular diastolic parameters are consistent with Grade I diastolic dysfunction (impaired relaxation). Elevated left atrial pressure.  2. Right ventricular systolic function is normal. The right ventricular size is normal. There is moderately elevated pulmonary artery systolic pressure.  3. Left atrial size was moderately dilated.  4. The pericardial effusion is posterior to the left ventricle.  5. The mitral valve is abnormal. Mild mitral valve regurgitation. Mild mitral stenosis. Moderate mitral annular calcification.  6. The aortic valve was not well visualized. There is mild calcification of the aortic valve. There is mild thickening of the aortic valve. Aortic valve regurgitation is not visualized. No aortic stenosis is present.  7. The inferior vena cava is dilated in size with <50% respiratory variability, suggesting right atrial pressure of 15 mmHg. FINDINGS  Left Ventricle: Left ventricular ejection fraction, by estimation, is 60 to 65%. The left ventricle has normal function. The left ventricle has no regional wall motion abnormalities. The left ventricular internal cavity size was normal in size. There is  borderline left ventricular hypertrophy. Left ventricular diastolic parameters  are consistent with Grade I diastolic dysfunction (impaired relaxation). Elevated left atrial pressure. Right Ventricle: The right ventricular size is normal. No increase in right ventricular wall thickness. Right ventricular systolic function is normal. There is moderately elevated pulmonary artery systolic pressure. The tricuspid regurgitant velocity is 2.97 m/s, and with an assumed right atrial pressure of 15 mmHg, the estimated right ventricular systolic pressure is 75.9 mmHg. Left Atrium: Left atrial size was moderately dilated. Right Atrium: Right atrial size was normal in size. Pericardium: Trivial pericardial effusion is present. The pericardial effusion is posterior to the left ventricle. Mitral Valve: The mitral valve is abnormal. There is mild thickening of the mitral valve leaflet(s). There is decreased mobility of the posterior leaflet. Moderate mitral annular calcification. Mild mitral valve regurgitation. Mild mitral valve stenosis. Tricuspid Valve: The tricuspid valve is not well visualized. Tricuspid valve regurgitation is mild. Aortic Valve: The aortic valve was not well visualized. There is mild calcification of the aortic valve. There is mild thickening of the aortic valve. Aortic valve regurgitation is not visualized. No aortic stenosis is present. Aortic valve peak gradient  measures 12.5 mmHg. Pulmonic Valve: The pulmonic valve was not well visualized. Pulmonic valve regurgitation is not visualized. No evidence of pulmonic stenosis. Aorta: The aortic root and ascending aorta are structurally normal, with no evidence of dilitation. Pulmonary Artery: The pulmonary artery is of normal size. Venous: The inferior vena cava is dilated in size with less than 50% respiratory variability, suggesting right atrial pressure of 15 mmHg. IAS/Shunts: No atrial level shunt detected by color flow Doppler.  LEFT VENTRICLE PLAX 2D LVIDd:         4.10 cm  Diastology LVIDs:         2.80 cm  LV e' medial:    4.03  cm/s LV PW:         0.90 cm  LV E/e' medial:  30.0 LV IVS:        0.99 cm  LV e' lateral:   3.48 cm/s LVOT diam:     1.70 cm  LV E/e' lateral: 34.8 LVOT Area:     2.27 cm  RIGHT VENTRICLE RV Mid diam:    3.40 cm RV S prime:     14.80 cm/s TAPSE (M-mode): 2.1 cm LEFT ATRIUM             Index       RIGHT ATRIUM  Index LA diam:        3.35 cm 2.44 cm/m  RA Area:     16.40 cm LA Vol (A2C):   74.3 ml 54.14 ml/m RA Volume:   41.70 ml  30.38 ml/m LA Vol (A4C):   66.6 ml 48.53 ml/m LA Biplane Vol: 70.7 ml 51.51 ml/m  AORTIC VALVE                PULMONIC VALVE AV Area (Vmax): 1.54 cm    PV Vmax:        0.90 m/s AV Vmax:        177.00 cm/s PV Peak grad:   3.2 mmHg AV Peak Grad:   12.5 mmHg   RVOT Peak grad: 1 mmHg LVOT Vmax:      120.00 cm/s  AORTA Ao Root diam: 2.35 cm Ao Asc diam:  2.50 cm MITRAL VALVE                TRICUSPID VALVE MV Area (PHT): 6.83 cm     TR Peak grad:   35.3 mmHg MV Decel Time: 111 msec     TR Vmax:        297.00 cm/s MV E velocity: 121.00 cm/s MV A velocity: 173.00 cm/s  SHUNTS MV E/A ratio:  0.70         Systemic Diam: 1.70 cm MV A Prime:    16.2 cm/s Nelva Bush MD Electronically signed by Nelva Bush MD Signature Date/Time: 03/24/2020/7:09:57 AM    Final     ASSESSMENT: Stage IV mantle cell carcinoma, thrombocytopenia, anemia.  PLAN:    1.  Stage IV mantle cell carcinoma: Patient last received treatment with Bendamustine and Rituxan on December 27 and 28, 2021.  Plan was a 28-day cycle with her next treatment occurring next week.  This may need to be delayed given her persistent cytopenias. Lymphadenopathy and splenomegaly seen on recent CT scan can be attributed to her lymphoma.  Her significant anemia and thrombocytopenia are likely multifactorial related to her lymphoma as well as her chemotherapy 3 weeks ago.  No intervention is needed at this time.  Patient has been instructed to keep follow-up appointment in clinic next week. 2.  Thrombocytopenia:  Multifactorial secondary to lymphoma and chemotherapy several weeks ago.  Improved to 21 with transfusion.  Continue to monitor daily CBC and transfuse to maintain platelet count greater than 10,000.    4 mg p.o. dexamethasone daily ordered. 3.  Anemia: Hemoglobin has declined to 6.7.  Have ordered 2 units packed red blood cells for today. 4.  Possible transfusion reaction: Proceed with both platelet and blood as above.  Recommend premedication with Tylenol, Benadryl, and Solu-Medrol. 5.  Confusion: Resolved. CT head is negative. 6.  Weakness and fatigue/poor appetite: Dexamethasone as above.  Encouraged patient to work with physical therapy prior to discharge.  Will follow.    Lloyd Huger, MD   04/07/2020 1:22 PM

## 2020-04-07 NOTE — Progress Notes (Addendum)
PROGRESS NOTE    Jacqueline Russo  WJX:[id number] DOB: Feb 24, 1930 DOA: 04/03/2020 PCP: [name], MD   Brief Narrative:  This 87 years old female with PMH significant for mantle cell lymphoma stage IV, history of MDS, CLL presents to the emergency department from oncology clinic for low platelet counts.  Patient reports she was called to have 1 unit of platelet transfusion in the clinic,  approximately 1 hour into the transfusion she has developed fever and sinus tachycardia.  She denies any chest pain, shortness of breath, vomiting, abdominal pain.  She is admitted for possible transfusion reaction.  Her hemoglobin was 5.5 and platelet was 11 on arrival to the ED.  Hematology/oncology was consulted.  Patient has received 3 units of PRBCs and 2 units of platelet transfusions so far.  Assessment & Plan:   Principal Problem:   Thrombocytopenia ([place]) Active Problems:   Benign essential hypertension   Anxiety   E coli bacteremia   Transfusion reaction   Palliative care encounter  Fever and sinus tachycardia possible transfusion reaction: Patient has developed fever and sinus tachycardia while having platelet transfusion. Oncology Dr. Tasia Catchings recommends admission and to contact blood bank and send phlebotomist for transfusion reaction work-up Blood bank has been contacted by ED provider and will send a phlebotomist for transfusion reaction work-up Blood cultures: No growth so far, urine cultures no growth so far.  UA unremarkable. CTA chest negative for acute PE,  There is a moderate to large left-sided pleural effusion with adjacent compressive atelectasis. There is mediastinal, hilar, and left supraclavicular adenopathy. There is significant splenomegaly of the partially visualized spleen. Findings are concerning for a lymphoproliferative disorder, such as lymphoma. There is no source of infection found.  Chest x-ray shows infiltrate but COVID test and influenza negative. She remained afebrile  since admitted in the hospital. Patient needs to be premedicated with steroids Tylenol and Benadryl before transfusion.  Thrombocytopenia- - Patient received partial unit of platelet in the clinic. - Repeat CBC shows platelet count 24 trended down, no active bleeding - If platelets are not appropriately increased, consult hematology  - Heme oncology consulted, recommended steroids. - Pletelet count 11.0, no bleeding noted. - Patient received 1 more unit of platelet transfusion, platelet improved to 21K.  Normochromic normocytic anemia: This could be secondary to lymphoproliferative disorder. Patient came with a hemoglobin of 5.5, transfuse 2 PRBC. Refused to have blood transfusion.   After explaining in detail he finally agreed to get 1 unit PRBC. Posttransfusion CBC hemoglobin 8.0 >> 7.8 >> 6.7 Hematology ordered 2 more units of PRBC.  Patient has agreed. Patient needs premedication with steroids, Tylenol and Benadryl  New headache - Resolved. - CT of the head without contrast negative for any acute abnormality.   Hx. Of  E. coli bacteremia-she was discharged home with IV ertapenem 1 g daily - Patient completed last dose on 04/03/2020.  Chart reviewed.   Hospitalization from 03/20/2020 to 03/26/2020 for sepsis secondary to E. coli bacteremia suspect secondary to right upper extremity cellulitis. she was treated with empiric antibiotics.  CT of the right upper extremity did not show any evidence of abscess, bony involvement, or necrotizing fasciitis.  Blood cultures was positive for E. coli.  ID specialist and general surgeon were consulted to assist with management.  ID recommended IV ertapenem 1 g daily for 8 days at discharge.  She completed antibiotics on 04/03/2020    DVT prophylaxis: SCDs  code Status: DNR Family Communication: No family at bedside  Disposition Plan:  Status is: Inpatient  Remains inpatient appropriate because:Inpatient level of care appropriate due to  severity of illness   Dispo: The patient is from: Home              Anticipated d/c is to: Home              Anticipated d/c date is: 3 days              Patient currently is not medically stable to d/c.  Consultants:   Oncology  Procedures:  None Antimicrobials:   Anti-infectives (From admission, onward)   None     Subjective: Patient was seen and examined at bedside.  Overnight events noted.  She seems to be more alert,  Awake and following commands in the morning.  She has agreed to get blood transfusion.  Objective: Vitals:   04/07/20 1105 04/07/20 1345 04/07/20 1407 04/07/20 1438  BP: (!) 106/47 120/66 123/61 (!) 122/59  Pulse: 88 85 88 89  Resp: 19 19 17 18   Temp: 98.6 F (37 C) 98.2 F (36.8 C) 98.5 F (36.9 C) 98.5 F (36.9 C)  TempSrc: Oral Oral  Oral  SpO2: 100% 95% 95% 98%  Weight:      Height:        Intake/Output Summary (Last 24 hours) at 04/07/2020 1500 Last data filed at 04/07/2020 1345 Gross per 24 hour  Intake 614.67 ml  Output 600 ml  Net 14.67 ml   Filed Weights   04/03/20 1503  Weight: 44 kg    Examination:  General exam: Appears calm and comfortable, not in any acute distress Respiratory system: Clear to auscultation. Respiratory effort normal. Cardiovascular system: S1 & S2 heard, RRR. No JVD, murmurs, rubs, gallops or clicks. No pedal edema. Gastrointestinal system: Abdomen is nondistended, soft and nontender. No organomegaly or masses felt. Normal bowel sounds heard. Central nervous system: Alert and oriented. No focal neurological deficits. Extremities: Symmetric 5 x 5 power. Skin: No rashes, lesions or ulcers Psychiatry: Judgement and insight appear normal. Mood & affect appropriate.     Data Reviewed: I have personally reviewed following labs and imaging studies  CBC: Recent Labs  Lab 04/03/20 0928 04/04/20 0107 04/04/20 0621 04/05/20 0506 04/06/20 0419 04/07/20 0358  WBC 11.7* 7.8 8.9 9.5 9.1 9.9  NEUTROABS 0.9*  0.8*  --   --   --   --   HGB 7.2* 5.5* 5.7* 8.0* 7.8* 6.7*  HCT 22.2* 17.3* 17.3* 23.7* 23.3* 20.3*  MCV 93.3 93.0 92.0 88.4 89.3 90.6  PLT 8* 29* 24* 18* 11* 21*   Basic Metabolic Panel: Recent Labs  Lab 04/04/20 0107 04/04/20 0621 04/05/20 0506 04/06/20 0419 04/07/20 0358  NA 139 140 141 143 141  K 3.9 3.5 3.2* 3.0* 3.8  CL 106 108 107 107 107  CO2 22 21* 21* 22 21*  GLUCOSE 133* 111* 108* 113* 128*  BUN 15 15 12  24* 39*  CREATININE 0.62 0.60 0.57 0.58 0.72  CALCIUM 8.7* 8.6* 9.1 9.0 8.9  MG  --   --  1.8 1.9  --   PHOS  --   --  2.7 3.3  --    GFR: Estimated Creatinine Clearance: 33.8 mL/min (by C-G formula based on SCr of 0.72 mg/dL). Liver Function Tests: Recent Labs  Lab 04/04/20 0107  AST 22  ALT 25  ALKPHOS 73  BILITOT 1.0  PROT 5.8*  ALBUMIN 3.0*   No results for input(s): LIPASE, AMYLASE in the  last 168 hours. No results for input(s): AMMONIA in the last 168 hours. Coagulation Profile: No results for input(s): INR, PROTIME in the last 168 hours. Cardiac Enzymes: No results for input(s): CKTOTAL, CKMB, CKMBINDEX, TROPONINI in the last 168 hours. BNP (last 3 results) No results for input(s): PROBNP in the last 8760 hours. HbA1C: No results for input(s): HGBA1C in the last 72 hours. CBG: No results for input(s): GLUCAP in the last 168 hours. Lipid Profile: No results for input(s): CHOL, HDL, LDLCALC, TRIG, CHOLHDL, LDLDIRECT in the last 72 hours. Thyroid Function Tests: No results for input(s): TSH, T4TOTAL, FREET4, T3FREE, THYROIDAB in the last 72 hours. Anemia Panel: Recent Labs    04/05/20 0506  RETICCTPCT 0.5   Sepsis Labs: No results for input(s): PROCALCITON, LATICACIDVEN in the last 168 hours.  Recent Results (from the past 240 hour(s))  Urine Culture     Status: Abnormal   Collection Time: 04/03/20 10:28 AM   Specimen: Urine, Clean Catch  Result Value Ref Range Status   Specimen Description   Final    URINE, CLEAN CATCH Performed  at [facility], [address] [place], [place] [address]    Special Requests   Final    NONE Performed at [facility], [address] [place], [place] [address]    Culture MULTIPLE SPECIES PRESENT, SUGGEST RECOLLECTION (A)  Final   Report Status 04/04/2020 FINAL  Final  Culture, blood (routine x 2)     Status: None (Preliminary result)   Collection Time: 04/03/20 10:49 PM   Specimen: BLOOD  Result Value Ref Range Status   Specimen Description BLOOD LEFT ANTECUBITAL  Final   Special Requests   Final    BOTTLES DRAWN AEROBIC AND ANAEROBIC Blood Culture results may not be optimal due to an excessive volume of blood received in culture bottles   Culture   Final    NO GROWTH 4 DAYS Performed at [facility], [address], [place], [place] [address]    Report Status PENDING  Incomplete  Culture, blood (routine x 2)     Status: None (Preliminary result)   Collection Time: 04/03/20 10:57 PM   Specimen: BLOOD  Result Value Ref Range Status   Specimen Description BLOOD BLOOD LEFT FOREARM  Final   Special Requests   Final    BOTTLES DRAWN AEROBIC AND ANAEROBIC Blood Culture results may not be optimal due to an excessive volume of blood received in culture bottles   Culture   Final    NO GROWTH 4 DAYS Performed at [facility], [address], [place], [place] [address]    Report Status PENDING  Incomplete  Resp Panel by RT-PCR (Flu A&B, Covid) Nasopharyngeal Swab     Status: None   Collection Time: 04/03/20 11:15 PM   Specimen: Nasopharyngeal Swab; Nasopharyngeal(NP) swabs in vial transport medium  Result Value Ref Range Status   SARS Coronavirus 2 by RT PCR NEGATIVE NEGATIVE Final    Comment: (NOTE) SARS-CoV-2 target nucleic acids are NOT DETECTED.  The SARS-CoV-2 RNA is generally detectable in upper respiratory specimens during the acute phase of infection. The lowest concentration of SARS-CoV-2 viral copies this assay can detect is 138  copies/mL. A negative result does not preclude SARS-Cov-2 infection and should not be used as the sole basis for treatment or other patient management decisions. A negative result may occur with  improper specimen collection/handling, submission of specimen other than nasopharyngeal swab, presence of viral mutation(s) within the areas  targeted by this assay, and inadequate number of viral copies(<138 copies/mL). A negative result must be combined with clinical observations, patient history, and epidemiological information. The expected result is Negative.  Fact Sheet for Patients:  EntrepreneurPulse.com.au  Fact Sheet for Healthcare Providers:  IncredibleEmployment.be  This test is no t yet approved or cleared by the [place] FDA and  has been authorized for detection and/or diagnosis of SARS-CoV-2 by FDA under an Emergency Use Authorization (EUA). This EUA will remain  in effect (meaning this test can be used) for the duration of the COVID-19 declaration under Section 564(b)(1) of the Act, 21 U.S.C.section 360bbb-3(b)(1), unless the authorization is terminated  or revoked sooner.       Influenza A by PCR NEGATIVE NEGATIVE Final   Influenza B by PCR NEGATIVE NEGATIVE Final    Comment: (NOTE) The Xpert Xpress SARS-CoV-2/FLU/RSV plus assay is intended as an aid in the diagnosis of influenza from Nasopharyngeal swab specimens and should not be used as a sole basis for treatment. Nasal washings and aspirates are unacceptable for Xpert Xpress SARS-CoV-2/FLU/RSV testing.  Fact Sheet for Patients: EntrepreneurPulse.com.au  Fact Sheet for Healthcare Providers: IncredibleEmployment.be  This test is not yet approved or cleared by the [place] FDA and has been authorized for detection and/or diagnosis of SARS-CoV-2 by FDA under an Emergency Use Authorization (EUA). This EUA will remain in effect (meaning  this test can be used) for the duration of the COVID-19 declaration under Section 564(b)(1) of the Act, 21 U.S.C. section 360bbb-3(b)(1), unless the authorization is terminated or revoked.  Performed at [facility], [address] [place], [place] [address]   Urine culture     Status: Abnormal   Collection Time: 04/03/20 11:59 PM   Specimen: Urine, Random  Result Value Ref Range Status   Specimen Description   Final    URINE, RANDOM Performed at [facility], [address] [place], [place] [address]    Special Requests   Final    NONE Performed at [facility], [address] [place], [place] [address]    Culture (A)  Final    <10,000 COLONIES/mL INSIGNIFICANT GROWTH Performed at [facility], [place] [address], [place], [place] [address]    Report Status 04/05/2020 FINAL  Final     Radiology Studies: No results found.  Scheduled Meds: . sodium chloride   Intravenous Once  . sodium chloride   Intravenous Once  . amLODipine  5 mg Oral Daily  . aspirin EC  81 mg Oral Daily  . dexamethasone  4 mg Oral Daily  . feeding supplement  1 Container Oral TID BM  . mirtazapine  7.5 mg Oral QHS  . [START ON 04/08/2020] multivitamin with minerals  1 tablet Oral Daily   Continuous Infusions: . sodium chloride       LOS: 3 days    Time spent: 25 mins.    Shawna Clamp, MD [facility]   If 7PM-7AM, please contact night-coverage

## 2020-04-07 NOTE — Progress Notes (Signed)
Initial Nutrition Assessment  DOCUMENTATION CODES:   Non-severe (moderate) malnutrition in context of chronic illness  INTERVENTION:   Boost Breeze po TID, each supplement provides 250 kcal and 9 grams of protein  MVI daily   Pt at high refeed risk; recommend monitor potassium, magnesium and phosphorus labs daily as oral intake improves.  NUTRITION DIAGNOSIS:   Moderate Malnutrition related to cancer and cancer related treatments as evidenced by moderate fat depletion,severe fat depletion,moderate muscle depletion,severe muscle depletion.  GOAL:   Patient will meet greater than or equal to 90% of their needs  MONITOR:   PO intake,Supplement acceptance,Labs,Weight trends,Skin,I & O's  REASON FOR ASSESSMENT:   Malnutrition Screening Tool    ASSESSMENT:   87 year old female with h/o peripheral neuropathy, hyperlipidemia, gout, ovarian cancer, type 2 diabetes, CKD stage III, vitamin B 12 deficiency, MDS, carotid stenosis, R arm cellulitis and stage IV mantle cell carcinoma who was sent to the emergency room for platelet transfusion complicated by possible transfusion reaction, weakness, fatigue and a poor appetite.  RD familiar with this patient from a recent previous admit ~ 2 weeks ago. Pt reports poor appetite and oral intake at baseline. Pt reports that her taste has changed and that she has no desire to eat. Pt reports that she eats better at home, than in the hospital. Pt is on remeron which she reports did not improve her appetite at all. Pt does not drink any supplements and is unwilling to entertain the idea of supplements. Pt does report that she drinks 4oz of Kiefer daily at home and she snacks on cashews to help her get more protein. RD will add Boost Breeze as pt was drinking this during her last admit. Pt is likely at refeed risk. Per chart, pt is down 6lbs(6%) over the past 2 months. Pt reports a 15lbs weight loss over the past year. Pt has remained weight stable since  her last admit. Palliative care consult pending.   Medications reviewed and include: aspirin, dexamethasone, remeron, MVI  Labs reviewed: BUN 39(H) Hgb 6.7(L), Hct 20.3(L) AIC 6.7(H)- 1/1  NUTRITION - FOCUSED PHYSICAL EXAM: from 1/3  Flowsheet Row Most Recent Value  Orbital Region Moderate depletion  Upper Arm Region Severe depletion  Thoracic and Lumbar Region Moderate depletion  Buccal Region Moderate depletion  Temple Region Moderate depletion  Clavicle Bone Region Moderate depletion  Clavicle and Acromion Bone Region Moderate depletion  Scapular Bone Region Moderate depletion  Dorsal Hand Severe depletion  Patellar Region Severe depletion  Anterior Thigh Region Severe depletion  Posterior Calf Region Severe depletion  Edema (RD Assessment) None  Hair Reviewed  Eyes Reviewed  Mouth Reviewed  Skin Reviewed  Nails Reviewed     Diet Order:   Diet Order            Diet regular Room service appropriate? Yes; Fluid consistency: Thin  Diet effective now                EDUCATION NEEDS:   Education needs have been addressed  Skin:  Skin Assessment: Reviewed RN Assessment (ecchymosis)  Last BM:  pta  Height:   Ht Readings from Last 1 Encounters:  04/03/20 5' (1.524 m)    Weight:   Wt Readings from Last 1 Encounters:  04/03/20 44 kg    Ideal Body Weight:  45.45 kg  BMI:  Body mass index is 18.94 kg/m.  Estimated Nutritional Needs:   Kcal:  1200-1400kcal/day  Protein:  60-70g/day  Fluid:  1.1-1.3L/day  Myriam Jacobson  Megan Salon MS, RD, LDN Please refer to [facility] for RD and/or RD on-call/weekend/after hours pager

## 2020-04-07 NOTE — Progress Notes (Signed)
Bladder scan read > 500 cc. Notified B. Randol Kern NPand received an order for  in and out cath.

## 2020-04-07 NOTE — Progress Notes (Signed)
In and out cath attempted twice. Once by Jonelle Sidle and once by myself, Jaime Grizzell NT+3. Unsuccessful in both attempts, pt in discomfort and requested for [place] to try again in a little while if still unable to urinate on own.

## 2020-04-07 NOTE — Consult Note (Signed)
[facility]  [phone] [phone]   Name: Jacqueline Russo Date: 04/07/2020 MRN: [id number]  DOB: 07-12-1929  Patient Care Team: [name], MD as PCP - General (Internal Medicine) Lloyd Huger, MD as Consulting Physician (Hematology and Oncology)    REASON FOR CONSULTATION: Jacqueline Russo is a 87 y.o. 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    Patient was admitted to hospital 04/03/2020 with thrombocytopenia.  She is status post multiple transfusions with platelets and packed red blood cells.  She was referred to palliative care to help address goals.  SOCIAL HISTORY:     reports that she quit smoking about 59 years ago. Her smoking use included cigarettes. She smoked 2.00 packs per day. She has never used smokeless tobacco. She reports previous alcohol use of about 1.0 standard drink of alcohol per week. She reports that she does not use drugs.  Patient is widowed.  She lives at the [place].  Patient has a son in [place] and another son in [place].  She has a sister who lives nearby.  Patient is originally from [place] and then lived in [place] prior to immigrating to the [place].  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  ADVANCE DIRECTIVES:  On file  CODE STATUS: DNR (MOST form completed on 05/23/19)  PAST MEDICAL HISTORY: Past Medical History:  Diagnosis Date  . Anemia    Myelodysplastic syndrome  . Arthritis   . Hypertension   . Leukemia, chronic lymphoid (HCC)    CLL  . Ovarian cancer ([place]) 1963    PAST SURGICAL HISTORY:  Past Surgical History:  Procedure Laterality Date  . ABDOMINAL HYSTERECTOMY    . APPENDECTOMY    . BREAST SURGERY     cyst removed from right breast  . EYE SURGERY Bilateral    Cataract Extractions  . IR FLUORO GUIDE CV LINE RIGHT  06/30/2017  .  REVERSE SHOULDER ARTHROPLASTY Right 10/28/2014   Procedure: REVERSE SHOULDER ARTHROPLASTY;  Surgeon: Corky Mull, MD;  Location: [facility] ORS;  Service: Orthopedics;  Laterality: Right;    HEMATOLOGY/ONCOLOGY HISTORY:  Oncology History Overview Note  History of MDS, 5q deletion.  Patient refused treatment with Revlimid.  Underwent a bone marrow biopsy on 06/30/2017 which showed hypercellular marrow 50% with dyspoiesis and increased blasts ~6% consistent with myelodysplastic syndrome.  Given history of MDS with isolated deletion (5q), presence of increased blasts likely indicative of progressive disease.  Now transfusion dependent.  She continued to refuse Revlimid.  Previously had discussed possibility of progressing to AML but patient declined further interventions beyond periodic blood transfusions.   History of iron overload and she receives Desferal prior to transfusions.   Patient has history of CLL and received single agent Rituxan in February 2015.  Previous bone marrow biopsy did not mention any involvement of CLL.    Oncology History     CLL (chronic lymphocytic leukemia) ([place])  11/16/2015 Initial Diagnosis   CLL (chronic lymphocytic leukemia) ([place])   Mantle cell lymphoma ([place])  03/05/2020 Initial Diagnosis   Mantle cell lymphoma ([place])   03/05/2020 Cancer Staging   Staging form: Hodgkin and Non-Hodgkin Lymphoma, AJCC 8th Edition - Clinical stage from 03/05/2020: Stage IV (Mantle cell lymphoma) - Signed by Lloyd Huger, MD on 03/05/2020   03/16/2020 -  Chemotherapy   The patient had palonosetron ([place])  injection 0.25 mg, 0.25 mg, Intravenous,  Once, 1 of 6 cycles Administration: 0.25 mg (03/16/2020) bendamustine (BENDEKA) 100 mg in sodium chloride 0.9 % 50 mL (1.8519 mg/mL) chemo infusion, 70 mg/m2 = 100 mg (100 % of original dose 70 mg/m2), Intravenous,  Once, 1 of 6 cycles Dose modification: 70 mg/m2 (original dose 70 mg/m2, Cycle 1, Reason: Provider  Judgment) Administration: 100 mg (03/16/2020), 100 mg (03/17/2020) riTUXimab-pvvr (RUXIENCE) 500 mg in sodium chloride 0.9 % 250 mL (1.6667 mg/mL) infusion, 375 mg/m2 = 500 mg, Intravenous,  Once, 1 of 6 cycles Administration: 500 mg (03/16/2020)  for chemotherapy treatment.      ALLERGIES:  is allergic to chlorhexidine and ciprofloxacin.  MEDICATIONS:  Current Facility-Administered Medications  Medication Dose Route Frequency Provider Last Rate Last Admin  . 0.9 %  sodium chloride infusion (Manually program via Guardrails IV Fluids)   Intravenous Once Shawna Clamp, MD      . 0.9 %  sodium chloride infusion (Manually program via Guardrails IV Fluids)   Intravenous Once Shawna Clamp, MD      . 0.9 %  sodium chloride infusion  10 mL/hr Intravenous Once Shawna Clamp, MD      . acetaminophen (TYLENOL) tablet 650 mg  650 mg Oral Q6H PRN Cox, Amy N, DO   650 mg at 04/06/20 2135  . ALPRAZolam Duanne Moron) tablet 0.25 mg  0.25 mg Oral Daily PRN Shawna Clamp, MD   0.25 mg at 04/06/20 0119  . amLODipine (NORVASC) tablet 5 mg  5 mg Oral Daily Shawna Clamp, MD   5 mg at 04/07/20 1003  . aspirin EC tablet 81 mg  81 mg Oral Daily Cox, Amy N, DO   81 mg at 04/07/20 1003  . dexamethasone (DECADRON) tablet 4 mg  4 mg Oral Daily Lloyd Huger, MD      . diphenhydrAMINE (BENADRYL) injection 50 mg  50 mg Intravenous Q6H PRN Cox, Amy N, DO   50 mg at 04/04/20 1219  . HYDROcodone-acetaminophen (NORCO) 7.5-325 MG per tablet 1 tablet  1 tablet Oral Q8H PRN Cox, Amy N, DO   1 tablet at 04/04/20 0101  . mirtazapine (REMERON) tablet 7.5 mg  7.5 mg Oral QHS Shawna Clamp, MD   7.5 mg at 04/06/20 2134  . ondansetron (ZOFRAN) tablet 4 mg  4 mg Oral Q6H PRN Cox, Amy N, DO       Or  . ondansetron (ZOFRAN) injection 4 mg  4 mg Intravenous Q6H PRN Cox, Amy N, DO       Facility-Administered Medications Ordered in Other Encounters  Medication Dose Route Frequency Provider Last Rate Last Admin  . heparin lock  flush 100 unit/mL  500 Units Intravenous Once Lloyd Huger, MD      . sodium chloride flush (NS) 0.9 % injection 10 mL  10 mL Intravenous PRN Lloyd Huger, MD   10 mL at 09/05/18 0822    VITAL SIGNS: BP (!) 106/47   Pulse 88   Temp 98.6 F (37 C) (Oral)   Resp 19   Ht 5' (1.524 m)   Wt 97 lb (44 kg)   SpO2 100%   BMI 18.94 kg/m  Filed Weights   04/03/20 1503  Weight: 97 lb (44 kg)    Estimated body mass index is 18.94 kg/m as calculated from the following:   Height as of this encounter: 5' (1.524 m).   Weight as of this encounter: 97 lb (44 kg).  LABS: CBC:  Component Value Date/Time   WBC 9.9 04/07/2020 0358   HGB 6.7 (L) 04/07/2020 0358   HGB 11.6 (L) 01/06/2014 0828   HCT 20.3 (L) 04/07/2020 0358   HCT 32.7 (L) 01/06/2014 0828   PLT 21 (LL) 04/07/2020 0358   PLT 176 01/06/2014 0828   MCV 90.6 04/07/2020 0358   MCV 103 (H) 01/06/2014 0828   NEUTROABS 0.8 (L) 04/04/2020 0107   NEUTROABS 1.6 01/06/2014 0828   LYMPHSABS 6.6 (H) 04/04/2020 0107   LYMPHSABS 1.4 01/06/2014 0828   MONOABS 0.3 04/04/2020 0107   MONOABS 0.3 01/06/2014 0828   EOSABS 0.0 04/04/2020 0107   EOSABS 0.1 01/06/2014 0828   BASOSABS 0.1 04/04/2020 0107   BASOSABS 0.1 01/06/2014 0828   Comprehensive Metabolic Panel:    Component Value Date/Time   NA 141 04/07/2020 0358   NA 140 06/26/2013 1044   K 3.8 04/07/2020 0358   K 3.7 06/26/2013 1044   CL 107 04/07/2020 0358   CL 102 06/26/2013 1044   CO2 21 (L) 04/07/2020 0358   CO2 32 06/26/2013 1044   BUN 39 (H) 04/07/2020 0358   BUN 16 06/26/2013 1044   CREATININE 0.72 04/07/2020 0358   CREATININE 1.01 01/06/2014 0828   GLUCOSE 128 (H) 04/07/2020 0358   GLUCOSE 121 (H) 06/26/2013 1044   CALCIUM 8.9 04/07/2020 0358   CALCIUM 9.0 06/26/2013 1044   AST 22 04/04/2020 0107   AST 12 (L) 03/27/2013 0955   ALT 25 04/04/2020 0107   ALT 20 03/27/2013 0955   ALKPHOS 73 04/04/2020 0107   ALKPHOS 87 03/27/2013 0955   BILITOT 1.0  04/04/2020 0107   BILITOT 0.5 03/27/2013 0955   PROT 5.8 (L) 04/04/2020 0107   PROT 7.3 03/27/2013 0955   ALBUMIN 3.0 (L) 04/04/2020 0107   ALBUMIN 4.1 03/27/2013 0955    RADIOGRAPHIC STUDIES: DG Chest 1 View  Result Date: 03/20/2020 CLINICAL DATA:  Right arm pain, swelling, and redness. Sepsis and fever. EXAM: CHEST  1 VIEW COMPARISON:  10/28/2016 FINDINGS: Power port type central venous catheter with tip over the cavoatrial junction region. No pneumothorax. Shallow inspiration. Cardiac enlargement. No vascular congestion or edema. Peribronchial thickening and streaky perihilar opacities similar to prior study, likely chronic bronchitic change. Mild atelectasis in the lung bases. No pleural effusions. No pneumothorax. Calcification of the aorta. Degenerative changes in the spine and left shoulder. Postoperative change in the right shoulder. IMPRESSION: Cardiac enlargement. No evidence of active pulmonary disease. Electronically Signed   By: Lucienne Capers M.D.   On: 03/20/2020 16:43   DG Wrist Complete Right  Result Date: 03/20/2020 CLINICAL DATA:  Acute right wrist pain and swelling. EXAM: RIGHT WRIST - COMPLETE 3+ VIEW COMPARISON:  None. FINDINGS: There is no evidence of fracture or dislocation. Severe narrowing of the first carpometacarpal joint is noted as well as mild narrowing of the radiocarpal joint. Calcification of the triangular fibrocartilage is noted. Soft tissues are unremarkable. IMPRESSION: Osteoarthritis is noted involving the first carpometacarpal joint and radiocarpal joint. No acute abnormality seen in the right wrist. Electronically Signed   By: Marijo Conception M.D.   On: 03/20/2020 10:15   CT HEAD WO CONTRAST  Result Date: 04/04/2020 CLINICAL DATA:  Headache.  History of lymphoma. EXAM: CT HEAD WITHOUT CONTRAST TECHNIQUE: Contiguous axial images were obtained from the base of the skull through the vertex without intravenous contrast. COMPARISON:  03/19/2020 FINDINGS:  Brain: There is no mass, hemorrhage or extra-axial collection. There is generalized atrophy without lobar predilection.  Hypodensity of the white matter is most commonly associated with chronic microvascular disease. Vascular: Atherosclerotic calcification of the vertebral and internal carotid arteries at the skull base. No abnormal hyperdensity of the major intracranial arteries or dural venous sinuses. Skull: The visualized skull base, calvarium and extracranial soft tissues are normal. Sinuses/Orbits: No fluid levels or advanced mucosal thickening of the visualized paranasal sinuses. No mastoid or middle ear effusion. The orbits are normal. IMPRESSION: 1. No acute intracranial abnormality. 2. Generalized atrophy and chronic microvascular ischemia. Electronically Signed   By: Ulyses Jarred M.D.   On: 04/04/2020 00:16   CT Head W Wo Contrast  Addendum Date: 03/19/2020   ADDENDUM REPORT: 03/19/2020 18:16 ADDENDUM: These results were called by telephone at the time of interpretation on 03/19/2020 at 6:14 pm to Beckey Rutter NP, who verbally acknowledged these results. Electronically Signed   By: Pedro Earls M.D.   On: 03/19/2020 18:16   Result Date: 03/19/2020 CLINICAL DATA:  Change in mental status. 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 Fall. EXAM: CT HEAD WITHOUT AND WITH CONTRAST TECHNIQUE: Contiguous axial images were obtained from the base of the skull through the vertex without and with intravenous contrast CONTRAST:  12mL OMNIPAQUE IOHEXOL 300 MG/ML  SOLN COMPARISON:  Head CT October 20, 2012. FINDINGS: Brain: A 9 mm hypodense focus in the anterior right thalamus, may represent ischemia, age indeterminate. No focus of abnormal contrast enhancement. No hemorrhage, hydrocephalus, mass effect or extra-axial collection. Vascular: No hyperdense vessel prominent calcified plaques in the bilateral vertebral arteries and carotid siphons. Skull: Normal. Negative for fracture or focal lesion.  Sinuses/Orbits: No acute finding. IMPRESSION: 1. Hypodense focus in the anterior right thalamus, may represent ischemia, age indeterminate. MRI could be used to further evaluate. 2. No focus of abnormal contrast enhancement. 3. Prominent calcified plaques in the bilateral vertebral arteries and carotid siphons. Electronically Signed: By: Pedro Earls M.D. On: 03/19/2020 17:30   CT ANGIO CHEST PE W OR WO CONTRAST  Result Date: 04/04/2020 CLINICAL DATA:  Concern for PE.  Shortness of breath. EXAM: CT ANGIOGRAPHY CHEST WITH CONTRAST TECHNIQUE: Multidetector CT imaging of the chest was performed using the standard protocol during bolus administration of intravenous contrast. Multiplanar CT image reconstructions and MIPs were obtained to evaluate the vascular anatomy. CONTRAST:  93mL OMNIPAQUE IOHEXOL 350 MG/ML SOLN COMPARISON:  CT dated June 09, 2014 FINDINGS: Cardiovascular: Contrast injection is sufficient to demonstrate satisfactory opacification of the pulmonary arteries to the segmental level. There is no pulmonary embolus or evidence of right heart strain. The size of the main pulmonary artery is normal. Cardiomegaly with coronary artery calcification. The course and caliber of the aorta are normal. There is mild atherosclerotic calcification. Opacification decreased due to pulmonary arterial phase contrast bolus timing. There is a right-sided central venous catheter with tip terminating near the cavoatrial junction. Mediastinum/Nodes: --mediastinal adenopathy is noted. For example there is a precarinal lymph node measuring approximately 1.7 cm. --hilar adenopathy is noted. -- No axillary lymphadenopathy. --there is left supraclavicular adenopathy. -- Normal thyroid gland where visualized. -  Unremarkable esophagus. Lungs/Pleura: There is a moderate to large left-sided pleural effusion with adjacent compressive atelectasis. There is no pneumothorax. The trachea is unremarkable. Upper Abdomen:  Contrast bolus timing is not optimized for evaluation of the abdominal organs. There is significant splenomegaly of the partially visualized spleen. There is a right hepatic lobe cyst measuring approximately 4.8 cm. There is partially visualized intrahepatic biliary ductal dilatation. There may be some adenopathy  at the level of the porta hepatis. There is moderate narrowing at the origin of the celiac axis. Musculoskeletal: There are old healed bilateral rib fractures. Review of the MIP images confirms the above findings. IMPRESSION: 1. There is no evidence for acute pulmonary embolus. 2. There is a moderate to large left-sided pleural effusion with adjacent compressive atelectasis. 3. There is mediastinal, hilar, and left supraclavicular adenopathy. There is significant splenomegaly of the partially visualized spleen. Findings are concerning for a lymphoproliferative disorder such as lymphoma. 4. There is partially visualized intrahepatic biliary ductal dilatation. Correlation with laboratory studies is recommended Aortic Atherosclerosis (ICD10-I70.0). Electronically Signed   By: Constance Holster M.D.   On: 04/04/2020 05:44   CT HUMERUS RIGHT W CONTRAST  Result Date: 03/26/2020 CLINICAL DATA:  Right arm cellulitis follow up. EXAM: CT OF THE UPPER RIGHT EXTREMITY WITH CONTRAST TECHNIQUE: Multidetector CT imaging of the upper right extremity was performed according to the standard protocol following intravenous contrast administration. CONTRAST:  94mL OMNIPAQUE IOHEXOL 300 MG/ML  SOLN COMPARISON:  CT right humerus and forearm dated March 21, 2020. FINDINGS: Bones/Joint/Cartilage No fracture or dislocation. No bony destruction or periosteal reaction. No joint effusion. Prior right reverse total shoulder arthroplasty. No evidence of hardware failure or loosening. Unchanged intra-articular bodies in the subscapularis recess. Ligaments Ligaments are suboptimally evaluated by CT. Muscles and Tendons Grossly intact.  Soft tissue Soft tissue swelling and skin thickening of the upper arm, most prominent medially, has mildly worsened and now extends further superiorly. Soft tissue swelling and skin thickening of the forearm has slightly improved. No fluid collection or subcutaneous emphysema. No soft tissue mass. Right chest wall port catheter. Small right pleural effusion. Hepatic cysts. IMPRESSION: 1. Worsening cellulitis of the upper arm. Mildly improved cellulitis of forearm. No abscess. 2. No acute osseous abnormality. 3. Small right pleural effusion. Electronically Signed   By: Titus Dubin M.D.   On: 03/26/2020 08:17   CT HUMERUS RIGHT W CONTRAST  Result Date: 03/21/2020 CLINICAL DATA:  Soft tissue swelling, elevated white count, infection suspected EXAM: CT OF THE RIGHT HUMERUS WITHOUT CONTRAST TECHNIQUE: Multidetector CT imaging was performed according to the standard protocol. Multiplanar CT image reconstructions were also generated. COMPARISON:  None. FINDINGS: Stranding noted within the subcutaneous soft tissues in the lower upper arm just above the elbow, the elbow and upper forearm. This likely reflects cellulitis or edema. No drainable focal fluid collection. Changes of right shoulder replacement. No hardware complicating feature. No acute bony abnormality. IMPRESSION: Stranding within the subcutaneous soft tissues about the elbow suggesting cellulitis or edema. No focal fluid collection. Electronically Signed   By: Rolm Baptise M.D.   On: 03/21/2020 10:58   CT FOREARM RIGHT W CONTRAST  Result Date: 03/26/2020 CLINICAL DATA:  Right arm cellulitis follow up. EXAM: CT OF THE UPPER RIGHT EXTREMITY WITH CONTRAST TECHNIQUE: Multidetector CT imaging of the upper right extremity was performed according to the standard protocol following intravenous contrast administration. CONTRAST:  25mL OMNIPAQUE IOHEXOL 300 MG/ML  SOLN COMPARISON:  CT right humerus and forearm dated March 21, 2020. FINDINGS:  Bones/Joint/Cartilage No fracture or dislocation. No bony destruction or periosteal reaction. No joint effusion. Prior right reverse total shoulder arthroplasty. No evidence of hardware failure or loosening. Unchanged intra-articular bodies in the subscapularis recess. Ligaments Ligaments are suboptimally evaluated by CT. Muscles and Tendons Grossly intact. Soft tissue Soft tissue swelling and skin thickening of the upper arm, most prominent medially, has mildly worsened and now extends further superiorly. Soft tissue  swelling and skin thickening of the forearm has slightly improved. No fluid collection or subcutaneous emphysema. No soft tissue mass. Right chest wall port catheter. Small right pleural effusion. Hepatic cysts. IMPRESSION: 1. Worsening cellulitis of the upper arm. Mildly improved cellulitis of forearm. No abscess. 2. No acute osseous abnormality. 3. Small right pleural effusion. Electronically Signed   By: Titus Dubin M.D.   On: 03/26/2020 08:17   CT FOREARM RIGHT W CONTRAST  Result Date: 03/21/2020 CLINICAL DATA:  Soft tissue infection suspected. Swelling, elevated white count EXAM: CT OF THE RIGHT FOREARM WITH CONTRAST TECHNIQUE: Multidetector CT imaging was performed according to the standard protocol. Multiplanar CT image reconstructions were also generated. COMPARISON:  None. FINDINGS: Stranding within the subcutaneous soft tissues noted in the distal upper arm, elbow region, and forearm compatible with cellulitis or edema. No drainable focal fluid collection. No acute bony abnormality. IMPRESSION: Stranding within the subcutaneous soft tissues throughout the forearm suggesting cellulitis or edema. No acute bony abnormality or focal fluid collection. Electronically Signed   By: Rolm Baptise M.D.   On: 03/21/2020 11:00   CT HAND RIGHT W CONTRAST  Result Date: 03/21/2020 CLINICAL DATA:  Soft tissue infection suspected. Swelling. Elevated white blood cell count. EXAM: CT OF THE RIGHT HAND  WITHOUT CONTRAST TECHNIQUE: Multidetector CT imaging of the right hand was performed according to the standard protocol. Multiplanar CT image reconstructions were also generated. COMPARISON:  None. FINDINGS: No focal fluid collection within the soft tissue to suggest abscess. Mild subcutaneous soft tissue stranding suggest cellulitis or edema. No acute bony abnormality. Degenerative changes in the wrist and IP joint. IMPRESSION: No acute bony abnormality.  No soft tissue drainable abscess. Electronically Signed   By: Rolm Baptise M.D.   On: 03/21/2020 10:56   US Venous Img Upper Uni Right(DVT)  Result Date: 03/20/2020 CLINICAL DATA:  Right arm swelling. Abrasion to the right wrist with possible infection yesterday. Worsening today. Patient is on chemotherapy for CLL. Pain and edema with color changes. Anticoagulation therapy. EXAM: Right UPPER EXTREMITY VENOUS DOPPLER ULTRASOUND TECHNIQUE: Gray-scale sonography with graded compression, as well as color Doppler and duplex ultrasound were performed to evaluate the upper extremity deep venous system from the level of the subclavian vein and including the jugular, axillary, basilic, radial, ulnar and upper cephalic vein. Spectral Doppler was utilized to evaluate flow at rest and with distal augmentation maneuvers. COMPARISON:  None. FINDINGS: Contralateral Subclavian Vein: Respiratory phasicity is normal and symmetric with the symptomatic side. No evidence of thrombus. Normal compressibility. Internal Jugular Vein: No evidence of thrombus. Normal compressibility, respiratory phasicity and response to augmentation. Subclavian Vein: No evidence of thrombus. Normal compressibility, respiratory phasicity and response to augmentation. Axillary Vein: No evidence of thrombus. Normal compressibility, respiratory phasicity and response to augmentation. Cephalic Vein: No evidence of thrombus. Normal compressibility, respiratory phasicity and response to augmentation. Basilic  Vein: No evidence of thrombus. Normal compressibility, respiratory phasicity and response to augmentation. Brachial Veins: No evidence of thrombus. Normal compressibility, respiratory phasicity and response to augmentation. Radial Veins: No evidence of thrombus. Normal compressibility, respiratory phasicity and response to augmentation. Ulnar Veins: No evidence of thrombus. Normal compressibility, respiratory phasicity and response to augmentation. Venous Reflux:  None visualized. Other Findings: Contralateral left subclavian vein is also evaluated and is patent. IMPRESSION: No evidence of DVT within the right upper extremity. Electronically Signed   By: Lucienne Capers M.D.   On: 03/20/2020 16:41   DG Chest Portable 1 View  Result Date: 04/03/2020  CLINICAL DATA:  Abnormal hemoglobin and platelet counts. EXAM: PORTABLE CHEST 1 VIEW COMPARISON:  March 20, 2020 FINDINGS: There is stable right-sided venous Port-A-Cath positioning. Stable chronic appearing increased lung markings are seen. Mild to moderate severity atelectasis and/or infiltrate is noted within the retrocardiac region of the left lung base. There is no evidence of a pleural effusion or pneumothorax. The cardiac silhouette is borderline in size. There is mild calcification of the aortic arch. Chronic right-sided rib fractures are noted. An intact right shoulder replacement is seen. IMPRESSION: Mild to moderate severity left basilar atelectasis and/or infiltrate. Electronically Signed   By: Virgina Norfolk M.D.   On: 04/03/2020 22:43   DG Hand Complete Right  Result Date: 03/20/2020 CLINICAL DATA:  Acute right hand pain and swelling. EXAM: RIGHT HAND - COMPLETE 3+ VIEW COMPARISON:  None. FINDINGS: There is no evidence of fracture or dislocation. Severe degenerative changes are seen involving the first carpometacarpal joint as well as the proximal and distal interphalangeal joints consistent with osteoarthritis. Soft tissues are unremarkable.  IMPRESSION: Severe osteoarthritis is noted. No acute abnormality seen in the right hand. Electronically Signed   By: Marijo Conception M.D.   On: 03/20/2020 10:13   ECHOCARDIOGRAM COMPLETE  Result Date: 03/24/2020    ECHOCARDIOGRAM REPORT   Patient Name:   Jacqueline Russo Date of Exam: 03/23/2020 Medical Rec #:  [id number]   Height:       60.0 in Accession #:    [id number]  Weight:       97.0 lb Date of Birth:  January 21, 1930  BSA:          1.372 m Patient Age:    35 years    BP:           182/80 mmHg Patient Gender: F           HR:           110 bpm. Exam Location:  [facility] Procedure: 2D Echo, Cardiac Doppler and Color Doppler Indications:     Bacteremia R78.81  History:         Patient has no prior history of Echocardiogram examinations.                  Risk Factors:Hypertension.  Sonographer:     Alyse Low Roar Referring Phys:  [id number] Tsosie Billing Diagnosing Phys: Nelva Bush MD IMPRESSIONS  1. Left ventricular ejection fraction, by estimation, is 60 to 65%. The left ventricle has normal function. The left ventricle has no regional wall motion abnormalities. Left ventricular diastolic parameters are consistent with Grade I diastolic dysfunction (impaired relaxation). Elevated left atrial pressure.  2. Right ventricular systolic function is normal. The right ventricular size is normal. There is moderately elevated pulmonary artery systolic pressure.  3. Left atrial size was moderately dilated.  4. The pericardial effusion is posterior to the left ventricle.  5. The mitral valve is abnormal. Mild mitral valve regurgitation. Mild mitral stenosis. Moderate mitral annular calcification.  6. The aortic valve was not well visualized. There is mild calcification of the aortic valve. There is mild thickening of the aortic valve. Aortic valve regurgitation is not visualized. No aortic stenosis is present.  7. The inferior vena cava is dilated in size with <50% respiratory variability, suggesting right atrial pressure of  15 mmHg. FINDINGS  Left Ventricle: Left ventricular ejection fraction, by estimation, is 60 to 65%. The left ventricle has normal function. The left ventricle has no regional wall motion abnormalities. The left ventricular  internal cavity size was normal in size. There is  borderline left ventricular hypertrophy. Left ventricular diastolic parameters are consistent with Grade I diastolic dysfunction (impaired relaxation). Elevated left atrial pressure. Right Ventricle: The right ventricular size is normal. No increase in right ventricular wall thickness. Right ventricular systolic function is normal. There is moderately elevated pulmonary artery systolic pressure. The tricuspid regurgitant velocity is 2.97 m/s, and with an assumed right atrial pressure of 15 mmHg, the estimated right ventricular systolic pressure is 00.1 mmHg. Left Atrium: Left atrial size was moderately dilated. Right Atrium: Right atrial size was normal in size. Pericardium: Trivial pericardial effusion is present. The pericardial effusion is posterior to the left ventricle. Mitral Valve: The mitral valve is abnormal. There is mild thickening of the mitral valve leaflet(s). There is decreased mobility of the posterior leaflet. Moderate mitral annular calcification. Mild mitral valve regurgitation. Mild mitral valve stenosis. Tricuspid Valve: The tricuspid valve is not well visualized. Tricuspid valve regurgitation is mild. Aortic Valve: The aortic valve was not well visualized. There is mild calcification of the aortic valve. There is mild thickening of the aortic valve. Aortic valve regurgitation is not visualized. No aortic stenosis is present. Aortic valve peak gradient  measures 12.5 mmHg. Pulmonic Valve: The pulmonic valve was not well visualized. Pulmonic valve regurgitation is not visualized. No evidence of pulmonic stenosis. Aorta: The aortic root and ascending aorta are structurally normal, with no evidence of dilitation. Pulmonary  Artery: The pulmonary artery is of normal size. Venous: The inferior vena cava is dilated in size with less than 50% respiratory variability, suggesting right atrial pressure of 15 mmHg. IAS/Shunts: No atrial level shunt detected by color flow Doppler.  LEFT VENTRICLE PLAX 2D LVIDd:         4.10 cm  Diastology LVIDs:         2.80 cm  LV e' medial:    4.03 cm/s LV PW:         0.90 cm  LV E/e' medial:  30.0 LV IVS:        0.99 cm  LV e' lateral:   3.48 cm/s LVOT diam:     1.70 cm  LV E/e' lateral: 34.8 LVOT Area:     2.27 cm  RIGHT VENTRICLE RV Mid diam:    3.40 cm RV S prime:     14.80 cm/s TAPSE (M-mode): 2.1 cm LEFT ATRIUM             Index       RIGHT ATRIUM           Index LA diam:        3.35 cm 2.44 cm/m  RA Area:     16.40 cm LA Vol (A2C):   74.3 ml 54.14 ml/m RA Volume:   41.70 ml  30.38 ml/m LA Vol (A4C):   66.6 ml 48.53 ml/m LA Biplane Vol: 70.7 ml 51.51 ml/m  AORTIC VALVE                PULMONIC VALVE AV Area (Vmax): 1.54 cm    PV Vmax:        0.90 m/s AV Vmax:        177.00 cm/s PV Peak grad:   3.2 mmHg AV Peak Grad:   12.5 mmHg   RVOT Peak grad: 1 mmHg LVOT Vmax:      120.00 cm/s  AORTA Ao Root diam: 2.35 cm Ao Asc diam:  2.50 cm MITRAL VALVE  TRICUSPID VALVE MV Area (PHT): 6.83 cm     TR Peak grad:   35.3 mmHg MV Decel Time: 111 msec     TR Vmax:        297.00 cm/s MV E velocity: 121.00 cm/s MV A velocity: 173.00 cm/s  SHUNTS MV E/A ratio:  0.70         Systemic Diam: 1.70 cm MV A Prime:    16.2 cm/s Nelva Bush MD Electronically signed by Nelva Bush MD Signature Date/Time: 03/24/2020/7:09:57 AM    Final     PERFORMANCE STATUS (ECOG) : 2 - Symptomatic, <50% confined to bed  Review of Systems Unless otherwise noted, a complete review of systems is negative.  Physical Exam General: NAD Pulmonary: Unlabored Extremities: no edema, no joint deformities Skin: no rashes Neurological: Weakness but otherwise nonfocal  IMPRESSION: Patient well-known to me from the  clinic.  Today, she reports feeling much improved.  She says that she is anxious and ready to discharge home as soon as possible.  We discussed the importance of stabilizing her blood counts.  She is currently receiving a PRBC transfusion.  Symptomatically, she denies any distressing symptoms at present.  She continues to endorse chronically poor oral intake.  Overall, her prognosis is poor.  She continues to endorse a desire to pursue treatment.  We will plan follow-up in the cancer center after she discharges.  PLAN: -Continue current scope of treatment -DNR -Follow-up in clinic after discharge from the hospital  Case and plan discussed with Dr. Grayland Ormond  Time Total: 60 minutes  Visit consisted of counseling and education dealing with the complex and emotionally intense issues of symptom management and palliative care in the setting of serious and potentially life-threatening illness.Greater than 50%  of this time was spent counseling and coordinating care related to the above assessment and plan.  Signed by: Altha Harm, PhD, NP-C

## 2020-04-08 ENCOUNTER — Telehealth: Payer: Self-pay | Admitting: *Deleted

## 2020-04-08 LAB — CBC
HCT: 31.7 % — ABNORMAL LOW (ref 36.0–46.0)
Hemoglobin: 10.9 g/dL — ABNORMAL LOW (ref 12.0–15.0)
MCH: 30.2 pg (ref 26.0–34.0)
MCHC: 34.4 g/dL (ref 30.0–36.0)
MCV: 87.8 fL (ref 80.0–100.0)
Platelets: 18 10*3/uL — CL (ref 150–400)
RBC: 3.61 MIL/uL — ABNORMAL LOW (ref 3.87–5.11)
RDW: 15.3 % (ref 11.5–15.5)
WBC: 13 10*3/uL — ABNORMAL HIGH (ref 4.0–10.5)
nRBC: 0 % (ref 0.0–0.2)

## 2020-04-08 LAB — BASIC METABOLIC PANEL
Anion gap: 9 (ref 5–15)
BUN: 36 mg/dL — ABNORMAL HIGH (ref 8–23)
CO2: 23 mmol/L (ref 22–32)
Calcium: 9.3 mg/dL (ref 8.9–10.3)
Chloride: 109 mmol/L (ref 98–111)
Creatinine, Ser: 0.6 mg/dL (ref 0.44–1.00)
GFR, Estimated: >60 mL/min (ref >60)
Glucose, Bld: 237 mg/dL — ABNORMAL HIGH (ref 70–99)
Potassium: 4.2 mmol/L (ref 3.5–5.1)
Sodium: 141 mmol/L (ref 135–145)

## 2020-04-08 LAB — CULTURE, BLOOD (ROUTINE X 2)
Culture: NO GROWTH
Culture: NO GROWTH

## 2020-04-08 LAB — BPAM RBC
Blood Product Expiration Date: 202201262359
Blood Product Expiration Date: 202201292359
ISSUE DATE / TIME: 202201181046
ISSUE DATE / TIME: 202201181416
Unit Type and Rh: 600
Unit Type and Rh: 9500

## 2020-04-08 LAB — TYPE AND SCREEN
ABO/RH(D): AB POS
Antibody Screen: NEGATIVE
Unit division: 0
Unit division: 0

## 2020-04-08 LAB — MAGNESIUM: Magnesium: 2 mg/dL (ref 1.7–2.4)

## 2020-04-08 LAB — PHOSPHORUS: Phosphorus: 2.8 mg/dL (ref 2.5–4.6)

## 2020-04-08 MED ORDER — MAGIC MOUTHWASH W/LIDOCAINE
5.0000 mL | Freq: Four times a day (QID) | ORAL | Status: DC
  Administered 2020-04-08 – 2020-04-10 (×6): 5 mL via ORAL
  Filled 2020-04-08 (×5): qty 5

## 2020-04-08 NOTE — Progress Notes (Signed)
PROGRESS NOTE    Jacqueline Russo  UEA:[id number] DOB: 28-Dec-1929 DOA: 04/03/2020 PCP: [name], MD    Brief Narrative:  This 87 years old female with PMH significant for mantle cell lymphoma stage IV, history of MDS, CLL presents to the emergency department from oncology clinic for low platelet counts.  Patient reports she was called to have 1 unit of platelet transfusion in the clinic,  approximately 1 hour into the transfusion she has developed fever and sinus tachycardia.  She denies any chest pain, shortness of breath, vomiting, abdominal pain.  She is admitted for possible transfusion reaction.  Her hemoglobin was 5.5 and platelet was 11 on arrival to the ED.  Hematology/oncology was consulted.  Patient has received 3 units of PRBCs and 2 units of platelet transfusions so far.   OFF note: Hospitalization from 03/20/2020 to 03/26/2020 for sepsis secondary to E. coli bacteremia suspect secondary to right upper extremity cellulitis. she was treated with empiric antibiotics. CT of the right upper extremity did not show any evidence of abscess, bony involvement, or necrotizing fasciitis. Blood cultures was positive for E. coli. ID specialist and general surgeon were consulted to assist with management. ID recommended IV ertapenem 1 g daily for 8 days at discharge.  She completed antibiotics on 04/03/2020    12/19-Platelet 18 today, via chat, oncology would like to hold off giving plt and monitor   Consultants:   oncology  Procedures:   Antimicrobials:       Subjective: Pt c/o mouth soarness , having hard time eating with pain in her mouth.  Denies hematuria, hematochezia, or any other bleeding  Objective: Vitals:   04/07/20 1934 04/08/20 0141 04/08/20 0435 04/08/20 0727  BP: 122/67  131/71 137/70  Pulse: 87  86 87  Resp:   18 18  Temp: 98.1 F (36.7 C)  98.3 F (36.8 C) 98 F (36.7 C)  TempSrc: Oral  Oral Oral  SpO2: 99%  97% 98%  Weight:  40.2 kg    Height:         Intake/Output Summary (Last 24 hours) at 04/08/2020 0828 Last data filed at 04/08/2020 0534 Gross per 24 hour  Intake 634.67 ml  Output 1150 ml  Net -515.33 ml   Filed Weights   04/03/20 1503 04/08/20 0141  Weight: 44 kg 40.2 kg    Examination:  General exam: Appears calm and comfortable  Respiratory system: Clear to auscultation. Respiratory effort normal. Cardiovascular system: S1 & S2 heard, RRR. No JVD, murmurs, rubs, gallops or clicks.  Gastrointestinal system: Abdomen is nondistended, soft and nontender.  Normal bowel sounds heard. Central nervous system: Alert and oriented.  Grossly intact Extremities: No edema Psychiatry: Judgement and insight appear normal. Mood & affect appropriate.     Data Reviewed: I have personally reviewed following labs and imaging studies  CBC: Recent Labs  Lab 04/03/20 0928 04/04/20 0107 04/04/20 0621 04/05/20 0506 04/06/20 0419 04/07/20 0358 04/08/20 0347  WBC 11.7* 7.8 8.9 9.5 9.1 9.9 13.0*  NEUTROABS 0.9* 0.8*  --   --   --   --   --   HGB 7.2* 5.5* 5.7* 8.0* 7.8* 6.7* 10.9*  HCT 22.2* 17.3* 17.3* 23.7* 23.3* 20.3* 31.7*  MCV 93.3 93.0 92.0 88.4 89.3 90.6 87.8  PLT 8* 29* 24* 18* 11* 21* 18*   Basic Metabolic Panel: Recent Labs  Lab 04/04/20 0621 04/05/20 0506 04/06/20 0419 04/07/20 0358 04/08/20 0347  NA 140 141 143 141 141  K 3.5 3.2* 3.0*  3.8 4.2  CL 108 107 107 107 109  CO2 21* 21* 22 21* 23  GLUCOSE 111* 108* 113* 128* 237*  BUN 15 12 24* 39* 36*  CREATININE 0.60 0.57 0.58 0.72 0.60  CALCIUM 8.6* 9.1 9.0 8.9 9.3  MG  --  1.8 1.9  --  2.0  PHOS  --  2.7 3.3  --  2.8   GFR: Estimated Creatinine Clearance: 30.8 mL/min (by C-G formula based on SCr of 0.6 mg/dL). Liver Function Tests: Recent Labs  Lab 04/04/20 0107  AST 22  ALT 25  ALKPHOS 73  BILITOT 1.0  PROT 5.8*  ALBUMIN 3.0*   No results for input(s): LIPASE, AMYLASE in the last 168 hours. No results for input(s): AMMONIA in the last 168  hours. Coagulation Profile: No results for input(s): INR, PROTIME in the last 168 hours. Cardiac Enzymes: No results for input(s): CKTOTAL, CKMB, CKMBINDEX, TROPONINI in the last 168 hours. BNP (last 3 results) No results for input(s): PROBNP in the last 8760 hours. HbA1C: No results for input(s): HGBA1C in the last 72 hours. CBG: No results for input(s): GLUCAP in the last 168 hours. Lipid Profile: No results for input(s): CHOL, HDL, LDLCALC, TRIG, CHOLHDL, LDLDIRECT in the last 72 hours. Thyroid Function Tests: No results for input(s): TSH, T4TOTAL, FREET4, T3FREE, THYROIDAB in the last 72 hours. Anemia Panel: No results for input(s): VITAMINB12, FOLATE, FERRITIN, TIBC, IRON, RETICCTPCT in the last 72 hours. Sepsis Labs: No results for input(s): PROCALCITON, LATICACIDVEN in the last 168 hours.  Recent Results (from the past 240 hour(s))  Urine Culture     Status: Abnormal   Collection Time: 04/03/20 10:28 AM   Specimen: Urine, Clean Catch  Result Value Ref Range Status   Specimen Description   Final    URINE, CLEAN CATCH Performed at [facility], [address], [place], [place] [address]    Special Requests   Final    NONE Performed at [facility], [address] [place], [place] [address]    Culture MULTIPLE SPECIES PRESENT, SUGGEST RECOLLECTION (A)  Final   Report Status 04/04/2020 FINAL  Final  Culture, blood (routine x 2)     Status: None   Collection Time: 04/03/20 10:49 PM   Specimen: BLOOD  Result Value Ref Range Status   Specimen Description BLOOD LEFT ANTECUBITAL  Final   Special Requests   Final    BOTTLES DRAWN AEROBIC AND ANAEROBIC Blood Culture results may not be optimal due to an excessive volume of blood received in culture bottles   Culture   Final    NO GROWTH 5 DAYS Performed at [facility], [address] [place], [place] [address]    Report Status 04/08/2020 FINAL  Final  Culture, blood (routine x 2)     Status:  None   Collection Time: 04/03/20 10:57 PM   Specimen: BLOOD  Result Value Ref Range Status   Specimen Description BLOOD BLOOD LEFT FOREARM  Final   Special Requests   Final    BOTTLES DRAWN AEROBIC AND ANAEROBIC Blood Culture results may not be optimal due to an excessive volume of blood received in culture bottles   Culture   Final    NO GROWTH 5 DAYS Performed at [facility], [address] [place], [place] [address]    Report Status 04/08/2020 FINAL  Final  Resp Panel by RT-PCR (Flu A&B, Covid) Nasopharyngeal Swab     Status: None   Collection Time: 04/03/20  11:15 PM   Specimen: Nasopharyngeal Swab; Nasopharyngeal(NP) swabs in vial transport medium  Result Value Ref Range Status   SARS Coronavirus 2 by RT PCR NEGATIVE NEGATIVE Final    Comment: (NOTE) SARS-CoV-2 target nucleic acids are NOT DETECTED.  The SARS-CoV-2 RNA is generally detectable in upper respiratory specimens during the acute phase of infection. The lowest concentration of SARS-CoV-2 viral copies this assay can detect is 138 copies/mL. A negative result does not preclude SARS-Cov-2 infection and should not be used as the sole basis for treatment or other patient management decisions. A negative result may occur with  improper specimen collection/handling, submission of specimen other than nasopharyngeal swab, presence of viral mutation(s) within the areas targeted by this assay, and inadequate number of viral copies(<138 copies/mL). A negative result must be combined with clinical observations, patient history, and epidemiological information. The expected result is Negative.  Fact Sheet for Patients:  EntrepreneurPulse.com.au  Fact Sheet for Healthcare Providers:  IncredibleEmployment.be  This test is no t yet approved or cleared by the [place] FDA and  has been authorized for detection and/or diagnosis of SARS-CoV-2 by FDA under an Emergency Use  Authorization (EUA). This EUA will remain  in effect (meaning this test can be used) for the duration of the COVID-19 declaration under Section 564(b)(1) of the Act, 21 U.S.C.section 360bbb-3(b)(1), unless the authorization is terminated  or revoked sooner.       Influenza A by PCR NEGATIVE NEGATIVE Final   Influenza B by PCR NEGATIVE NEGATIVE Final    Comment: (NOTE) The Xpert Xpress SARS-CoV-2/FLU/RSV plus assay is intended as an aid in the diagnosis of influenza from Nasopharyngeal swab specimens and should not be used as a sole basis for treatment. Nasal washings and aspirates are unacceptable for Xpert Xpress SARS-CoV-2/FLU/RSV testing.  Fact Sheet for Patients: EntrepreneurPulse.com.au  Fact Sheet for Healthcare Providers: IncredibleEmployment.be  This test is not yet approved or cleared by the [place] FDA and has been authorized for detection and/or diagnosis of SARS-CoV-2 by FDA under an Emergency Use Authorization (EUA). This EUA will remain in effect (meaning this test can be used) for the duration of the COVID-19 declaration under Section 564(b)(1) of the Act, 21 U.S.C. section 360bbb-3(b)(1), unless the authorization is terminated or revoked.  Performed at [facility], [address] [place], [place] [address]   Urine culture     Status: Abnormal   Collection Time: 04/03/20 11:59 PM   Specimen: Urine, Random  Result Value Ref Range Status   Specimen Description   Final    URINE, RANDOM Performed at [facility], [address], [place], [place] [address]    Special Requests   Final    NONE Performed at [facility], [address] [place], [place] [address]    Culture (A)  Final    <10,000 COLONIES/mL INSIGNIFICANT GROWTH Performed at [facility], [place] [address], [place], [place] [address]    Report Status 04/05/2020 FINAL  Final         Radiology Studies: No results  found.      Scheduled Meds: . sodium chloride   Intravenous Once  . sodium chloride   Intravenous Once  . amLODipine  5 mg Oral Daily  . aspirin EC  81 mg Oral Daily  . dexamethasone  4 mg Oral Daily  . feeding supplement  1 Container Oral TID BM  . mirtazapine  7.5 mg Oral QHS  . multivitamin with minerals  1  tablet Oral Daily   Continuous Infusions: . sodium chloride      Assessment & Plan:   Principal Problem:   Thrombocytopenia ([place]) Active Problems:   Benign essential hypertension   Anxiety   E coli bacteremia   Transfusion reaction   Palliative care encounter   Fever and sinus tachycardia possible transfusion reaction: Patient has developed fever and sinus tachycardia while having platelet transfusion. Oncology Dr. Tasia Catchings recommends admission and to contact blood bank and send phlebotomist for transfusion reaction work-up Blood bank has been contacted by ED provider and will send a phlebotomist for transfusion reaction work-up Blood cultures: No growth so far, urine cultures no growth so far.  UA unremarkable. CTA chest negative for acute PE,  There is a moderate to large left-sided pleural effusion with adjacent compressive atelectasis. There is mediastinal, hilar, and left supraclavicular adenopathy. There is significant splenomegaly of the partially visualized spleen. Findings are concerning for a lymphoproliferative disorder, such as lymphoma. There is no source of infection found.  Chest x-ray shows infiltrate but COVID test and influenza negative. She remained afebrile since admitted in the hospital. Patient needs to be premedicated with steroids Tylenol and Benadryl before transfusion. 1/19-no issues today, no plan for transfusion today  Thrombocytopenia- -Patient received partial unit of platelet in the clinic. - No current bleeding. Platelets 18, spoke to oncology via chat Dr. Grayland Ormond wants to monitor for now and do not transfuse.   Normochromic  normocytic anemia: This could be secondary to lymphoproliferative disorder. Patient came with a hemoglobin of 5.5, transfuse 2 PRBC. s/p 1 unit PRBC after she agreed as initially she refused. Posttransfusion CBC hemoglobin 8.0 >> 7.8 >> 6.>>>10.9 S/p 2 units prbc Patient needs premedication with steroids, tylenol, and benadryl   New headache - Resolved CT head negative for acute abn.     Hx. Of  E. coli bacteremia-she was discharged home with IV ertapenem 1 g daily -Patient completed last dose on 04/03/2020   DVT prophylaxis: scd Code Status:dnr Family Communication: none at bedside  Status is: Inpatient  Not inpatient appropriate, will call UM team and downgrade to OBS.   Dispo: The patient is from: Home              Anticipated d/c is to: Home              Anticipated d/c date is: 3 days              Patient currently is not medically stable to d/c.Still with low platelets, oncology needs to clear pt.            LOS: 4 days   Time spent: 35 minutes with more than 50% on [place], MD [facility] Pager [phone]  If 7PM-7AM, please contact night-coverage 04/08/2020, 8:28 AM

## 2020-04-08 NOTE — Telephone Encounter (Signed)
Patient sister Lynelle Smoke called requesting a return call from Dr Grayland Ormond to find out patient condition and prognosis as she is still in the hospital [phone]

## 2020-04-08 NOTE — Progress Notes (Signed)
Mobility Specialist - Progress Note   04/08/20 1207  Mobility  Activity Dangled on edge of bed;Stood at bedside (seated exercises, took 3 L lateral steps towards HOB)  Level of Assistance Standby assist, set-up cues, supervision of patient - no hands on  Assistive Device Other (Comment) (HHA)  Mobility Response Tolerated well  Mobility performed by Mobility specialist  $Mobility charge 1 Mobility    Pt laying in bed upon arrival. Pt agreed to session. Pt pleasant and motivated t/o session. Pt able to get to EOB SBA. Pt dangled EOB. Pt performed seated exercises: B UE crossover reach x10, straight arm raises x10, kicks x10, marches x10, and ankle circles x10. Assisted pt w/ scooting closer to [facility] Pt S2S and took 3 L lateral steps towards [facility] w/ HHA. No LOB noted. Overall, pt tolerated session very well. Pt left laying in bed w alarm set. All needs placed in reach. Nurse was notified.     Reiana Poteet Mobility Specialist  04/08/20, 12:11 PM

## 2020-04-09 LAB — CBC
HCT: 30.8 % — ABNORMAL LOW (ref 36.0–46.0)
Hemoglobin: 11 g/dL — ABNORMAL LOW (ref 12.0–15.0)
MCH: 30.7 pg (ref 26.0–34.0)
MCHC: 35.7 g/dL (ref 30.0–36.0)
MCV: 86 fL (ref 80.0–100.0)
Platelets: 17 10*3/uL — CL (ref 150–400)
RBC: 3.58 MIL/uL — ABNORMAL LOW (ref 3.87–5.11)
RDW: 15.1 % (ref 11.5–15.5)
WBC: 15.9 10*3/uL — ABNORMAL HIGH (ref 4.0–10.5)
nRBC: 0 % (ref 0.0–0.2)

## 2020-04-09 LAB — GLUCOSE, CAPILLARY
Glucose-Capillary: 190 mg/dL — ABNORMAL HIGH (ref 70–99)
Glucose-Capillary: 192 mg/dL — ABNORMAL HIGH (ref 70–99)
Glucose-Capillary: 189 mg/dL — ABNORMAL HIGH (ref 70–99)

## 2020-04-09 MED ORDER — INSULIN ASPART 100 UNIT/ML ~~LOC~~ SOLN
0.0000 [IU] | Freq: Three times a day (TID) | SUBCUTANEOUS | Status: DC
  Administered 2020-04-09 (×2): 2 [IU] via SUBCUTANEOUS
  Filled 2020-04-09 (×2): qty 1

## 2020-04-09 MED ORDER — BENZOCAINE 10 % MT GEL: Freq: Two times a day (BID) | OROMUCOSAL | 0 refills | Status: AC | PRN

## 2020-04-09 NOTE — NC FL2 (Signed)
[place] LEVEL OF CARE SCREENING TOOL     IDENTIFICATION  Patient Name: Jacqueline Russo Birthdate: [birth year] Sex: female Admission Date (Current Location): 04/03/2020  [facility] and [place] Number:  Engineering geologist and Address:  [facility], [address] [place], [place] [address]      Provider Number: [id number]  Attending Physician Name and Address:  Nolberto Hanlon, MD  Relative Name and Phone Number:       Current Level of Care: Hospital Recommended Level of Care: [place] Prior Approval Number:    Date Approved/Denied:   PASRR Number:    Discharge Plan: SNF    Current Diagnoses: Patient Active Problem List   Diagnosis Date Noted  . Palliative care encounter   . Thrombocytopenia ([place]) 04/03/2020  . Transfusion reaction 04/03/2020  . E coli bacteremia 03/26/2020  . Malnutrition of moderate degree 03/24/2020  . Arm swelling   . Sepsis due to Escherichia coli (E. coli) ([place]) 03/20/2020  . Cellulitis 03/20/2020  . Abnormal CT of brain 03/20/2020  . Mantle cell lymphoma ([place]) 03/05/2020  . Goals of care, counseling/discussion 03/05/2020  . Anxiety 05/17/2019  . Hypokalemia due to inadequate potassium intake 05/17/2019  . Symptomatic anemia 05/17/2019  . Iron overload 05/17/2019  . MDS (myelodysplastic syndrome) with 5q deletion ([place]) 05/16/2019  . Port-A-Cath in place 02/18/2019  . Exertional dyspnea 10/04/2018  . Gouty arthropathy, chronic, without tophi 07/04/2018  . Right renal mass 02/28/2018  . DM type 2 with diabetic mixed hyperlipidemia ([place]) 08/25/2017  . Idiopathic peripheral neuropathy 02/22/2017  . CLL (chronic lymphocytic leukemia) ([place]) 11/16/2015  . Myelodysplastic syndrome ([place]) 11/16/2015  . Vitamin B12 deficient megaloblastic anemia 10/22/2015  . Carotid stenosis, bilateral 07/21/2015  . Diet-controlled type 2 diabetes mellitus ([place]) 04/22/2015  . CKD (chronic kidney disease) stage 3,  GFR 30-59 ml/min ([facility]) 04/22/2015  . Benign essential hypertension 01/14/2014  . Hyperlipemia 01/14/2014  . Primary osteoarthritis of both hands 01/14/2014    Orientation RESPIRATION BLADDER Height & Weight     Self,Time,Situation,Place  Normal Continent Weight: 82 lb 12.8 oz (37.6 kg) Height:  5' (152.4 cm)  BEHAVIORAL SYMPTOMS/MOOD NEUROLOGICAL BOWEL NUTRITION STATUS      Incontinent Diet (regular)  AMBULATORY STATUS COMMUNICATION OF NEEDS Skin   Extensive Assist Verbally Normal                       Personal Care Assistance Level of Assistance  Dressing,Feeding,Bathing Bathing Assistance: Maximum assistance Feeding assistance: Maximum assistance Dressing Assistance: Maximum assistance     Functional Limitations Info  Hearing,Sight,Speech Sight Info: Adequate Hearing Info: Adequate Speech Info: Adequate    SPECIAL CARE FACTORS FREQUENCY                       Contractures Contractures Info: Not present    Additional Factors Info  Code Status,Allergies,Isolation Precautions Code Status Info: DNR Allergies Info: Chlorhexidine, Ciprofloxacin     Isolation Precautions Info: ESBL     Current Medications (04/09/2020):  This is the current hospital active medication list Current Facility-Administered Medications  Medication Dose Route Frequency Provider Last Rate Last Admin  . 0.9 %  sodium chloride infusion (Manually program via Guardrails IV Fluids)   Intravenous Once Shawna Clamp, MD      . 0.9 %  sodium chloride infusion (Manually program via Guardrails IV Fluids)   Intravenous Once Shawna Clamp, MD      . 0.9 %  sodium chloride infusion  10 mL/hr Intravenous Once Shawna Clamp, MD      . acetaminophen (TYLENOL) tablet 650 mg  650 mg Oral Q6H PRN Cox, Amy N, DO   650 mg at 04/09/20 0326  . ALPRAZolam Duanne Moron) tablet 0.25 mg  0.25 mg Oral Daily PRN Shawna Clamp, MD   0.25 mg at 04/08/20 2044  . amLODipine (NORVASC) tablet 5 mg  5 mg Oral Daily Shawna Clamp, MD   5 mg at 04/09/20 0930  . benzocaine (ORAJEL) 10 % mucosal gel   Mouth/Throat BID PRN Shawna Clamp, MD   Given at 04/09/20 0932  . dexamethasone (DECADRON) tablet 4 mg  4 mg Oral Daily Lloyd Huger, MD   4 mg at 04/09/20 0930  . diphenhydrAMINE (BENADRYL) injection 50 mg  50 mg Intravenous Q6H PRN Cox, Amy N, DO   50 mg at 04/04/20 1219  . feeding supplement (BOOST / RESOURCE BREEZE) liquid 1 Container  1 Container Oral TID BM Val Riles, MD   1 Container at 04/09/20 [id number]  . HYDROcodone-acetaminophen (NORCO) 7.5-325 MG per tablet 1 tablet  1 tablet Oral Q8H PRN Cox, Amy N, DO   1 tablet at 04/04/20 0101  . insulin aspart (novoLOG) injection 0-9 Units  0-9 Units Subcutaneous TID WC Nolberto Hanlon, MD   2 Units at 04/09/20 1335  . magic mouthwash w/lidocaine  5 mL Oral QID Nolberto Hanlon, MD   5 mL at 04/09/20 1127  . mirtazapine (REMERON) tablet 7.5 mg  7.5 mg Oral QHS Shawna Clamp, MD   7.5 mg at 04/08/20 2044  . multivitamin with minerals tablet 1 tablet  1 tablet Oral Daily Val Riles, MD   1 tablet at 04/09/20 0930  . ondansetron (ZOFRAN) tablet 4 mg  4 mg Oral Q6H PRN Cox, Amy N, DO       Or  . ondansetron (ZOFRAN) injection 4 mg  4 mg Intravenous Q6H PRN Cox, Amy N, DO       Facility-Administered Medications Ordered in Other Encounters  Medication Dose Route Frequency Provider Last Rate Last Admin  . heparin lock flush 100 unit/mL  500 Units Intravenous Once Lloyd Huger, MD      . sodium chloride flush (NS) 0.9 % injection 10 mL  10 mL Intravenous PRN Lloyd Huger, MD   10 mL at 09/05/18 0539     Discharge Medications: Please see discharge summary for a list of discharge medications.  Relevant Imaging Results:  Relevant Lab Results:   Additional Information SSN:[id number], Hopsice services ordered at SNF  [place], LCSW

## 2020-04-09 NOTE — Discharge Summary (Signed)
Jacqueline Russo PIR:[id number] DOB: 1929/07/18 DOA: 04/03/2020  PCP: [name], MD  Admit date: 04/03/2020 Discharge date: 04/10/2020  Admitted From: home Disposition:  Home with [facility]  Recommendations for Outpatient Follow-up:  1. Follow up with PCP in 1 week 2. Please obtain BMP/CBC in one week      Discharge Condition:Stable CODE STATUS:DNR  Diet recommendation: Heart Healthy Brief/Interim Summary: Per HPI:  Jacqueline Russo is a 87 y.o. female with medical history significant for mantle cell lymphoma stage IV, history of MDS, CLL, presented to the emergency department from oncology clinic for chief concerns of low platelets. She was told to have 1 unit of platelets and then sent home, however approximately 1 hour into the transfusion, patient developed fever and sinus tachycardia.She was admitted for possible transfusion reaction. Hematology was consulted. Patient has received 3 units of PRBCs and 2 units of platelettransfusions .  Fever and sinus tachycardia possible transfusion reaction: Patient had developed fever and sinus tachycardia while having platelet transfusion Blood cultures: No growth so far, urine cultures no growth so far. UA unremarkable. CTA chest negative for acute PE, There is a moderate to large left-sided pleural effusion with adjacent compressive atelectasis. There is mediastinal, hilar, and left supraclavicular adenopathy. There is significant splenomegaly of the partially visualized spleen. Findings are concerning for a lymphoproliferative disorder, such as lymphoma. No infectious source found. cxr had showed.cxr atel/infiltrate. But covid test negative. Likely was atelectas. Remained afebrile Patient needs to be premedicated with steroids, Tylenol, Benadryl before transfusions during her hospitalization.    Thrombocytopenia- -Patient received partial unit of plateletin the clinic. Platelet 17 today Dr. Grayland Ormond saw pt today and pt  decided to go home with hospice, no further f/u needed D/c decadron per oncology   Normochromic normocytic anemia: This could be secondary to lymphoproliferative disorder. S/p multiple prbc transfusion  New headache- Resolved CT head negative for acute abnormality   Hx. Of E. coli bacteremia-she was discharged home with IV ertapenem 1 g daily -Patient completed last dose on 04/03/2020  Covid test on 1/20-negative.  Discharge Diagnoses:  Principal Problem:   Thrombocytopenia ([place]) Active Problems:   Benign essential hypertension   Anxiety   E coli bacteremia   Transfusion reaction   Palliative care encounter    Discharge Instructions  Discharge Instructions    Diet - low sodium heart healthy   Complete by: As directed    Increase activity slowly   Complete by: As directed    Increase activity slowly   Complete by: As directed      Allergies as of 04/10/2020      Reactions   Chlorhexidine    Ciprofloxacin Nausea Only      Medication List    STOP taking these medications   aspirin 81 MG tablet   diphenhydrAMINE 25 MG tablet Commonly known as: SOMINEX   diphenoxylate-atropine 2.5-0.025 MG tablet Commonly known as: LOMOTIL   ertapenem  IVPB Commonly known as: INVANZ   etodolac 400 MG tablet Commonly known as: LODINE   hydrALAZINE 50 MG tablet Commonly known as: APRESOLINE   hydroxyurea 500 MG capsule Commonly known as: HYDREA   potassium chloride SA 20 MEQ tablet Commonly known as: KLOR-CON   senna 8.6 MG Tabs tablet Commonly known as: SENOKOT     TAKE these medications   acetaminophen 325 MG tablet Commonly known as: TYLENOL Take 2 tablets (650 mg total) by mouth every 6 (six) hours as needed for mild pain (or Fever >/= 101).  allopurinol 300 MG tablet Commonly known as: ZYLOPRIM Take 1 tablet (300 mg total) by mouth daily.   ALPRAZolam 0.25 MG tablet Commonly known as: XANAX Take 1 tablet (0.25 mg total) by mouth daily as  needed for anxiety.   amLODipine 5 MG tablet Commonly known as: NORVASC Take 1 tablet (5 mg total) by mouth daily.   benzocaine 10 % mucosal gel Commonly known as: ORAJEL Use as directed in the mouth or throat 2 (two) times daily as needed for mouth pain.   cyanocobalamin 1000 MCG/ML injection Commonly known as: (VITAMIN B-12) Inject into the muscle.   HYDROcodone-acetaminophen 5-325 MG tablet Commonly known as: NORCO/VICODIN Take 1-2 tablets by mouth every 8 (eight) hours as needed for moderate pain or severe pain.   lidocaine-prilocaine cream Commonly known as: EMLA Apply 1 application topically as needed. Apply to port 1-2 hours prior to appointment. Cover with plastic wrap.   loperamide 2 MG tablet Commonly known as: IMODIUM A-D Take 1 tablet (2 mg total) by mouth 4 (four) times daily as needed for diarrhea or loose stools.   magic mouthwash Soln Take 30 mLs by mouth 4 (four) times daily as needed for mouth pain.   mirtazapine 7.5 MG tablet Commonly known as: REMERON Take 1 tablet (7.5 mg total) by mouth at bedtime.   nystatin 100000 UNIT/ML suspension Commonly known as: MYCOSTATIN Take 10 mLs by mouth 4 (four) times daily.   PreserVision AREDS 2 Caps Take 1 capsule by mouth daily.   tiZANidine 2 MG tablet Commonly known as: ZANAFLEX Take 1 tablet (2 mg total) by mouth at bedtime as needed for muscle spasms.       Allergies  Allergen Reactions  . Chlorhexidine   . Ciprofloxacin Nausea Only    Consultations:  Palliative care  Oncology/hematology   Procedures/Studies: DG Chest 1 View  Result Date: 03/20/2020 CLINICAL DATA:  Right arm pain, swelling, and redness. Sepsis and fever. EXAM: CHEST  1 VIEW COMPARISON:  10/28/2016 FINDINGS: Power port type central venous catheter with tip over the cavoatrial junction region. No pneumothorax. Shallow inspiration. Cardiac enlargement. No vascular congestion or edema. Peribronchial thickening and streaky  perihilar opacities similar to prior study, likely chronic bronchitic change. Mild atelectasis in the lung bases. No pleural effusions. No pneumothorax. Calcification of the aorta. Degenerative changes in the spine and left shoulder. Postoperative change in the right shoulder. IMPRESSION: Cardiac enlargement. No evidence of active pulmonary disease. Electronically Signed   By: Lucienne Capers M.D.   On: 03/20/2020 16:43   DG Wrist Complete Right  Result Date: 03/20/2020 CLINICAL DATA:  Acute right wrist pain and swelling. EXAM: RIGHT WRIST - COMPLETE 3+ VIEW COMPARISON:  None. FINDINGS: There is no evidence of fracture or dislocation. Severe narrowing of the first carpometacarpal joint is noted as well as mild narrowing of the radiocarpal joint. Calcification of the triangular fibrocartilage is noted. Soft tissues are unremarkable. IMPRESSION: Osteoarthritis is noted involving the first carpometacarpal joint and radiocarpal joint. No acute abnormality seen in the right wrist. Electronically Signed   By: Marijo Conception M.D.   On: 03/20/2020 10:15   CT HEAD WO CONTRAST  Result Date: 04/04/2020 CLINICAL DATA:  Headache.  History of lymphoma. EXAM: CT HEAD WITHOUT CONTRAST TECHNIQUE: Contiguous axial images were obtained from the base of the skull through the vertex without intravenous contrast. COMPARISON:  03/19/2020 FINDINGS: Brain: There is no mass, hemorrhage or extra-axial collection. There is generalized atrophy without lobar predilection. Hypodensity of the white matter  is most commonly associated with chronic microvascular disease. Vascular: Atherosclerotic calcification of the vertebral and internal carotid arteries at the skull base. No abnormal hyperdensity of the major intracranial arteries or dural venous sinuses. Skull: The visualized skull base, calvarium and extracranial soft tissues are normal. Sinuses/Orbits: No fluid levels or advanced mucosal thickening of the visualized paranasal  sinuses. No mastoid or middle ear effusion. The orbits are normal. IMPRESSION: 1. No acute intracranial abnormality. 2. Generalized atrophy and chronic microvascular ischemia. Electronically Signed   By: Ulyses Jarred M.D.   On: 04/04/2020 00:16   CT Head W Wo Contrast  Addendum Date: 03/19/2020   ADDENDUM REPORT: 03/19/2020 18:16 ADDENDUM: These results were called by telephone at the time of interpretation on 03/19/2020 at 6:14 pm to Beckey Rutter NP, who verbally acknowledged these results. Electronically Signed   By: Pedro Earls M.D.   On: 03/19/2020 18:16   Result Date: 03/19/2020 CLINICAL DATA:  Change in mental status. 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 Fall. EXAM: CT HEAD WITHOUT AND WITH CONTRAST TECHNIQUE: Contiguous axial images were obtained from the base of the skull through the vertex without and with intravenous contrast CONTRAST:  60mL OMNIPAQUE IOHEXOL 300 MG/ML  SOLN COMPARISON:  Head CT October 20, 2012. FINDINGS: Brain: A 9 mm hypodense focus in the anterior right thalamus, may represent ischemia, age indeterminate. No focus of abnormal contrast enhancement. No hemorrhage, hydrocephalus, mass effect or extra-axial collection. Vascular: No hyperdense vessel prominent calcified plaques in the bilateral vertebral arteries and carotid siphons. Skull: Normal. Negative for fracture or focal lesion. Sinuses/Orbits: No acute finding. IMPRESSION: 1. Hypodense focus in the anterior right thalamus, may represent ischemia, age indeterminate. MRI could be used to further evaluate. 2. No focus of abnormal contrast enhancement. 3. Prominent calcified plaques in the bilateral vertebral arteries and carotid siphons. Electronically Signed: By: Pedro Earls M.D. On: 03/19/2020 17:30   CT ANGIO CHEST PE W OR WO CONTRAST  Result Date: 04/04/2020 CLINICAL DATA:  Concern for PE.  Shortness of breath. EXAM: CT ANGIOGRAPHY CHEST WITH CONTRAST TECHNIQUE: Multidetector CT  imaging of the chest was performed using the standard protocol during bolus administration of intravenous contrast. Multiplanar CT image reconstructions and MIPs were obtained to evaluate the vascular anatomy. CONTRAST:  47mL OMNIPAQUE IOHEXOL 350 MG/ML SOLN COMPARISON:  CT dated June 09, 2014 FINDINGS: Cardiovascular: Contrast injection is sufficient to demonstrate satisfactory opacification of the pulmonary arteries to the segmental level. There is no pulmonary embolus or evidence of right heart strain. The size of the main pulmonary artery is normal. Cardiomegaly with coronary artery calcification. The course and caliber of the aorta are normal. There is mild atherosclerotic calcification. Opacification decreased due to pulmonary arterial phase contrast bolus timing. There is a right-sided central venous catheter with tip terminating near the cavoatrial junction. Mediastinum/Nodes: --mediastinal adenopathy is noted. For example there is a precarinal lymph node measuring approximately 1.7 cm. --hilar adenopathy is noted. -- No axillary lymphadenopathy. --there is left supraclavicular adenopathy. -- Normal thyroid gland where visualized. -  Unremarkable esophagus. Lungs/Pleura: There is a moderate to large left-sided pleural effusion with adjacent compressive atelectasis. There is no pneumothorax. The trachea is unremarkable. Upper Abdomen: Contrast bolus timing is not optimized for evaluation of the abdominal organs. There is significant splenomegaly of the partially visualized spleen. There is a right hepatic lobe cyst measuring approximately 4.8 cm. There is partially visualized intrahepatic biliary ductal dilatation. There may be some adenopathy at the level of the  porta hepatis. There is moderate narrowing at the origin of the celiac axis. Musculoskeletal: There are old healed bilateral rib fractures. Review of the MIP images confirms the above findings. IMPRESSION: 1. There is no evidence for acute pulmonary  embolus. 2. There is a moderate to large left-sided pleural effusion with adjacent compressive atelectasis. 3. There is mediastinal, hilar, and left supraclavicular adenopathy. There is significant splenomegaly of the partially visualized spleen. Findings are concerning for a lymphoproliferative disorder such as lymphoma. 4. There is partially visualized intrahepatic biliary ductal dilatation. Correlation with laboratory studies is recommended Aortic Atherosclerosis (ICD10-I70.0). Electronically Signed   By: Constance Holster M.D.   On: 04/04/2020 05:44   CT HUMERUS RIGHT W CONTRAST  Result Date: 03/26/2020 CLINICAL DATA:  Right arm cellulitis follow up. EXAM: CT OF THE UPPER RIGHT EXTREMITY WITH CONTRAST TECHNIQUE: Multidetector CT imaging of the upper right extremity was performed according to the standard protocol following intravenous contrast administration. CONTRAST:  75mL OMNIPAQUE IOHEXOL 300 MG/ML  SOLN COMPARISON:  CT right humerus and forearm dated March 21, 2020. FINDINGS: Bones/Joint/Cartilage No fracture or dislocation. No bony destruction or periosteal reaction. No joint effusion. Prior right reverse total shoulder arthroplasty. No evidence of hardware failure or loosening. Unchanged intra-articular bodies in the subscapularis recess. Ligaments Ligaments are suboptimally evaluated by CT. Muscles and Tendons Grossly intact. Soft tissue Soft tissue swelling and skin thickening of the upper arm, most prominent medially, has mildly worsened and now extends further superiorly. Soft tissue swelling and skin thickening of the forearm has slightly improved. No fluid collection or subcutaneous emphysema. No soft tissue mass. Right chest wall port catheter. Small right pleural effusion. Hepatic cysts. IMPRESSION: 1. Worsening cellulitis of the upper arm. Mildly improved cellulitis of forearm. No abscess. 2. No acute osseous abnormality. 3. Small right pleural effusion. Electronically Signed   By: Titus Dubin M.D.   On: 03/26/2020 08:17   CT HUMERUS RIGHT W CONTRAST  Result Date: 03/21/2020 CLINICAL DATA:  Soft tissue swelling, elevated white count, infection suspected EXAM: CT OF THE RIGHT HUMERUS WITHOUT CONTRAST TECHNIQUE: Multidetector CT imaging was performed according to the standard protocol. Multiplanar CT image reconstructions were also generated. COMPARISON:  None. FINDINGS: Stranding noted within the subcutaneous soft tissues in the lower upper arm just above the elbow, the elbow and upper forearm. This likely reflects cellulitis or edema. No drainable focal fluid collection. Changes of right shoulder replacement. No hardware complicating feature. No acute bony abnormality. IMPRESSION: Stranding within the subcutaneous soft tissues about the elbow suggesting cellulitis or edema. No focal fluid collection. Electronically Signed   By: Rolm Baptise M.D.   On: 03/21/2020 10:58   CT FOREARM RIGHT W CONTRAST  Result Date: 03/26/2020 CLINICAL DATA:  Right arm cellulitis follow up. EXAM: CT OF THE UPPER RIGHT EXTREMITY WITH CONTRAST TECHNIQUE: Multidetector CT imaging of the upper right extremity was performed according to the standard protocol following intravenous contrast administration. CONTRAST:  86mL OMNIPAQUE IOHEXOL 300 MG/ML  SOLN COMPARISON:  CT right humerus and forearm dated March 21, 2020. FINDINGS: Bones/Joint/Cartilage No fracture or dislocation. No bony destruction or periosteal reaction. No joint effusion. Prior right reverse total shoulder arthroplasty. No evidence of hardware failure or loosening. Unchanged intra-articular bodies in the subscapularis recess. Ligaments Ligaments are suboptimally evaluated by CT. Muscles and Tendons Grossly intact. Soft tissue Soft tissue swelling and skin thickening of the upper arm, most prominent medially, has mildly worsened and now extends further superiorly. Soft tissue swelling and skin thickening of  the forearm has slightly improved. No fluid  collection or subcutaneous emphysema. No soft tissue mass. Right chest wall port catheter. Small right pleural effusion. Hepatic cysts. IMPRESSION: 1. Worsening cellulitis of the upper arm. Mildly improved cellulitis of forearm. No abscess. 2. No acute osseous abnormality. 3. Small right pleural effusion. Electronically Signed   By: Titus Dubin M.D.   On: 03/26/2020 08:17   CT FOREARM RIGHT W CONTRAST  Result Date: 03/21/2020 CLINICAL DATA:  Soft tissue infection suspected. Swelling, elevated white count EXAM: CT OF THE RIGHT FOREARM WITH CONTRAST TECHNIQUE: Multidetector CT imaging was performed according to the standard protocol. Multiplanar CT image reconstructions were also generated. COMPARISON:  None. FINDINGS: Stranding within the subcutaneous soft tissues noted in the distal upper arm, elbow region, and forearm compatible with cellulitis or edema. No drainable focal fluid collection. No acute bony abnormality. IMPRESSION: Stranding within the subcutaneous soft tissues throughout the forearm suggesting cellulitis or edema. No acute bony abnormality or focal fluid collection. Electronically Signed   By: Rolm Baptise M.D.   On: 03/21/2020 11:00   CT HAND RIGHT W CONTRAST  Result Date: 03/21/2020 CLINICAL DATA:  Soft tissue infection suspected. Swelling. Elevated white blood cell count. EXAM: CT OF THE RIGHT HAND WITHOUT CONTRAST TECHNIQUE: Multidetector CT imaging of the right hand was performed according to the standard protocol. Multiplanar CT image reconstructions were also generated. COMPARISON:  None. FINDINGS: No focal fluid collection within the soft tissue to suggest abscess. Mild subcutaneous soft tissue stranding suggest cellulitis or edema. No acute bony abnormality. Degenerative changes in the wrist and IP joint. IMPRESSION: No acute bony abnormality.  No soft tissue drainable abscess. Electronically Signed   By: Rolm Baptise M.D.   On: 03/21/2020 10:56   US Venous Img Upper Uni  Right(DVT)  Result Date: 03/20/2020 CLINICAL DATA:  Right arm swelling. Abrasion to the right wrist with possible infection yesterday. Worsening today. Patient is on chemotherapy for CLL. Pain and edema with color changes. Anticoagulation therapy. EXAM: Right UPPER EXTREMITY VENOUS DOPPLER ULTRASOUND TECHNIQUE: Gray-scale sonography with graded compression, as well as color Doppler and duplex ultrasound were performed to evaluate the upper extremity deep venous system from the level of the subclavian vein and including the jugular, axillary, basilic, radial, ulnar and upper cephalic vein. Spectral Doppler was utilized to evaluate flow at rest and with distal augmentation maneuvers. COMPARISON:  None. FINDINGS: Contralateral Subclavian Vein: Respiratory phasicity is normal and symmetric with the symptomatic side. No evidence of thrombus. Normal compressibility. Internal Jugular Vein: No evidence of thrombus. Normal compressibility, respiratory phasicity and response to augmentation. Subclavian Vein: No evidence of thrombus. Normal compressibility, respiratory phasicity and response to augmentation. Axillary Vein: No evidence of thrombus. Normal compressibility, respiratory phasicity and response to augmentation. Cephalic Vein: No evidence of thrombus. Normal compressibility, respiratory phasicity and response to augmentation. Basilic Vein: No evidence of thrombus. Normal compressibility, respiratory phasicity and response to augmentation. Brachial Veins: No evidence of thrombus. Normal compressibility, respiratory phasicity and response to augmentation. Radial Veins: No evidence of thrombus. Normal compressibility, respiratory phasicity and response to augmentation. Ulnar Veins: No evidence of thrombus. Normal compressibility, respiratory phasicity and response to augmentation. Venous Reflux:  None visualized. Other Findings: Contralateral left subclavian vein is also evaluated and is patent. IMPRESSION: No  evidence of DVT within the right upper extremity. Electronically Signed   By: Lucienne Capers M.D.   On: 03/20/2020 16:41   DG Chest Portable 1 View  Result Date: 04/03/2020 CLINICAL DATA:  Abnormal  hemoglobin and platelet counts. EXAM: PORTABLE CHEST 1 VIEW COMPARISON:  March 20, 2020 FINDINGS: There is stable right-sided venous Port-A-Cath positioning. Stable chronic appearing increased lung markings are seen. Mild to moderate severity atelectasis and/or infiltrate is noted within the retrocardiac region of the left lung base. There is no evidence of a pleural effusion or pneumothorax. The cardiac silhouette is borderline in size. There is mild calcification of the aortic arch. Chronic right-sided rib fractures are noted. An intact right shoulder replacement is seen. IMPRESSION: Mild to moderate severity left basilar atelectasis and/or infiltrate. Electronically Signed   By: Virgina Norfolk M.D.   On: 04/03/2020 22:43   DG Hand Complete Right  Result Date: 03/20/2020 CLINICAL DATA:  Acute right hand pain and swelling. EXAM: RIGHT HAND - COMPLETE 3+ VIEW COMPARISON:  None. FINDINGS: There is no evidence of fracture or dislocation. Severe degenerative changes are seen involving the first carpometacarpal joint as well as the proximal and distal interphalangeal joints consistent with osteoarthritis. Soft tissues are unremarkable. IMPRESSION: Severe osteoarthritis is noted. No acute abnormality seen in the right hand. Electronically Signed   By: Marijo Conception M.D.   On: 03/20/2020 10:13   ECHOCARDIOGRAM COMPLETE  Result Date: 03/24/2020    ECHOCARDIOGRAM REPORT   Patient Name:   Jacqueline Russo Date of Exam: 03/23/2020 Medical Rec #:  [id number]   Height:       60.0 in Accession #:    [id number]  Weight:       97.0 lb Date of Birth:  1930-03-05  BSA:          1.372 m Patient Age:    61 years    BP:           182/80 mmHg Patient Gender: F           HR:           110 bpm. Exam Location:  [facility] Procedure: 2D  Echo, Cardiac Doppler and Color Doppler Indications:     Bacteremia R78.81  History:         Patient has no prior history of Echocardiogram examinations.                  Risk Factors:Hypertension.  Sonographer:     Alyse Low Roar Referring Phys:  [id number] Tsosie Billing Diagnosing Phys: Nelva Bush MD IMPRESSIONS  1. Left ventricular ejection fraction, by estimation, is 60 to 65%. The left ventricle has normal function. The left ventricle has no regional wall motion abnormalities. Left ventricular diastolic parameters are consistent with Grade I diastolic dysfunction (impaired relaxation). Elevated left atrial pressure.  2. Right ventricular systolic function is normal. The right ventricular size is normal. There is moderately elevated pulmonary artery systolic pressure.  3. Left atrial size was moderately dilated.  4. The pericardial effusion is posterior to the left ventricle.  5. The mitral valve is abnormal. Mild mitral valve regurgitation. Mild mitral stenosis. Moderate mitral annular calcification.  6. The aortic valve was not well visualized. There is mild calcification of the aortic valve. There is mild thickening of the aortic valve. Aortic valve regurgitation is not visualized. No aortic stenosis is present.  7. The inferior vena cava is dilated in size with <50% respiratory variability, suggesting right atrial pressure of 15 mmHg. FINDINGS  Left Ventricle: Left ventricular ejection fraction, by estimation, is 60 to 65%. The left ventricle has normal function. The left ventricle has no regional wall motion abnormalities. The left ventricular internal cavity size was normal  in size. There is  borderline left ventricular hypertrophy. Left ventricular diastolic parameters are consistent with Grade I diastolic dysfunction (impaired relaxation). Elevated left atrial pressure. Right Ventricle: The right ventricular size is normal. No increase in right ventricular wall thickness. Right ventricular  systolic function is normal. There is moderately elevated pulmonary artery systolic pressure. The tricuspid regurgitant velocity is 2.97 m/s, and with an assumed right atrial pressure of 15 mmHg, the estimated right ventricular systolic pressure is 0000000 mmHg. Left Atrium: Left atrial size was moderately dilated. Right Atrium: Right atrial size was normal in size. Pericardium: Trivial pericardial effusion is present. The pericardial effusion is posterior to the left ventricle. Mitral Valve: The mitral valve is abnormal. There is mild thickening of the mitral valve leaflet(s). There is decreased mobility of the posterior leaflet. Moderate mitral annular calcification. Mild mitral valve regurgitation. Mild mitral valve stenosis. Tricuspid Valve: The tricuspid valve is not well visualized. Tricuspid valve regurgitation is mild. Aortic Valve: The aortic valve was not well visualized. There is mild calcification of the aortic valve. There is mild thickening of the aortic valve. Aortic valve regurgitation is not visualized. No aortic stenosis is present. Aortic valve peak gradient  measures 12.5 mmHg. Pulmonic Valve: The pulmonic valve was not well visualized. Pulmonic valve regurgitation is not visualized. No evidence of pulmonic stenosis. Aorta: The aortic root and ascending aorta are structurally normal, with no evidence of dilitation. Pulmonary Artery: The pulmonary artery is of normal size. Venous: The inferior vena cava is dilated in size with less than 50% respiratory variability, suggesting right atrial pressure of 15 mmHg. IAS/Shunts: No atrial level shunt detected by color flow Doppler.  LEFT VENTRICLE PLAX 2D LVIDd:         4.10 cm  Diastology LVIDs:         2.80 cm  LV e' medial:    4.03 cm/s LV PW:         0.90 cm  LV E/e' medial:  30.0 LV IVS:        0.99 cm  LV e' lateral:   3.48 cm/s LVOT diam:     1.70 cm  LV E/e' lateral: 34.8 LVOT Area:     2.27 cm  RIGHT VENTRICLE RV Mid diam:    3.40 cm RV S prime:      14.80 cm/s TAPSE (M-mode): 2.1 cm LEFT ATRIUM             Index       RIGHT ATRIUM           Index LA diam:        3.35 cm 2.44 cm/m  RA Area:     16.40 cm LA Vol (A2C):   74.3 ml 54.14 ml/m RA Volume:   41.70 ml  30.38 ml/m LA Vol (A4C):   66.6 ml 48.53 ml/m LA Biplane Vol: 70.7 ml 51.51 ml/m  AORTIC VALVE                PULMONIC VALVE AV Area (Vmax): 1.54 cm    PV Vmax:        0.90 m/s AV Vmax:        177.00 cm/s PV Peak grad:   3.2 mmHg AV Peak Grad:   12.5 mmHg   RVOT Peak grad: 1 mmHg LVOT Vmax:      120.00 cm/s  AORTA Ao Root diam: 2.35 cm Ao Asc diam:  2.50 cm MITRAL VALVE  TRICUSPID VALVE MV Area (PHT): 6.83 cm     TR Peak grad:   35.3 mmHg MV Decel Time: 111 msec     TR Vmax:        297.00 cm/s MV E velocity: 121.00 cm/s MV A velocity: 173.00 cm/s  SHUNTS MV E/A ratio:  0.70         Systemic Diam: 1.70 cm MV A Prime:    16.2 cm/s Nelva Bush MD Electronically signed by Nelva Bush MD Signature Date/Time: 03/24/2020/7:09:57 AM    Final       Subjective:   Discharge Exam: Vitals:   04/10/20 0323 04/10/20 0753  BP: (!) 157/94 (!) 143/78  Pulse: 87 74  Resp: 17 16  Temp: 97.8 F (36.6 C) 98 F (36.7 C)  SpO2:  97%   Vitals:   04/09/20 1612 04/09/20 2043 04/10/20 0323 04/10/20 0753  BP: 124/69 (!) 146/72 (!) 157/94 (!) 143/78  Pulse: 76 76 87 74  Resp: 18 18 17 16   Temp: 97.7 F (36.5 C) 98.1 F (36.7 C) 97.8 F (36.6 C) 98 F (36.7 C)  TempSrc:  Oral  Oral  SpO2: 97% 98%  97%  Weight:   38.3 kg   Height:        General: Pt is alert, awake, not in acute distress Cardiovascular: RRR, S1/S2 +, no rubs, no gallops Respiratory: CTA bilaterally, no wheezing, no rhonchi Abdominal: Soft, NT, ND, bowel sounds + Extremities: no edema, no cyanosis    The results of significant diagnostics from this hospitalization (including imaging, microbiology, ancillary and laboratory) are listed below for reference.     Microbiology: Recent Results (from the  past 240 hour(s))  Urine Culture     Status: Abnormal   Collection Time: 04/03/20 10:28 AM   Specimen: Urine, Clean Catch  Result Value Ref Range Status   Specimen Description   Final    URINE, CLEAN CATCH Performed at [facility], [address] [place], [place] [address]    Special Requests   Final    NONE Performed at [facility], [address] [place], [place] [address]    Culture MULTIPLE SPECIES PRESENT, SUGGEST RECOLLECTION (A)  Final   Report Status 04/04/2020 FINAL  Final  Culture, blood (routine x 2)     Status: None   Collection Time: 04/03/20 10:49 PM   Specimen: BLOOD  Result Value Ref Range Status   Specimen Description BLOOD LEFT ANTECUBITAL  Final   Special Requests   Final    BOTTLES DRAWN AEROBIC AND ANAEROBIC Blood Culture results may not be optimal due to an excessive volume of blood received in culture bottles   Culture   Final    NO GROWTH 5 DAYS Performed at [facility], [address] [place], [place] [address]    Report Status 04/08/2020 FINAL  Final  Culture, blood (routine x 2)     Status: None   Collection Time: 04/03/20 10:57 PM   Specimen: BLOOD  Result Value Ref Range Status   Specimen Description BLOOD BLOOD LEFT FOREARM  Final   Special Requests   Final    BOTTLES DRAWN AEROBIC AND ANAEROBIC Blood Culture results may not be optimal due to an excessive volume of blood received in culture bottles   Culture   Final    NO GROWTH 5 DAYS Performed at [facility], [address], [place], [place] [address]    Report Status 04/08/2020 FINAL  Final  Resp Panel  by RT-PCR (Flu A&B, Covid) Nasopharyngeal Swab     Status: None   Collection Time: 04/03/20 11:15 PM   Specimen: Nasopharyngeal Swab; Nasopharyngeal(NP) swabs in vial transport medium  Result Value Ref Range Status   SARS Coronavirus 2 by RT PCR NEGATIVE NEGATIVE Final    Comment: (NOTE) SARS-CoV-2 target nucleic acids are NOT DETECTED.  The  SARS-CoV-2 RNA is generally detectable in upper respiratory specimens during the acute phase of infection. The lowest concentration of SARS-CoV-2 viral copies this assay can detect is 138 copies/mL. A negative result does not preclude SARS-Cov-2 infection and should not be used as the sole basis for treatment or other patient management decisions. A negative result may occur with  improper specimen collection/handling, submission of specimen other than nasopharyngeal swab, presence of viral mutation(s) within the areas targeted by this assay, and inadequate number of viral copies(<138 copies/mL). A negative result must be combined with clinical observations, patient history, and epidemiological information. The expected result is Negative.  Fact Sheet for Patients:  EntrepreneurPulse.com.au  Fact Sheet for Healthcare Providers:  IncredibleEmployment.be  This test is no t yet approved or cleared by the [place] FDA and  has been authorized for detection and/or diagnosis of SARS-CoV-2 by FDA under an Emergency Use Authorization (EUA). This EUA will remain  in effect (meaning this test can be used) for the duration of the COVID-19 declaration under Section 564(b)(1) of the Act, 21 U.S.C.section 360bbb-3(b)(1), unless the authorization is terminated  or revoked sooner.       Influenza A by PCR NEGATIVE NEGATIVE Final   Influenza B by PCR NEGATIVE NEGATIVE Final    Comment: (NOTE) The Xpert Xpress SARS-CoV-2/FLU/RSV plus assay is intended as an aid in the diagnosis of influenza from Nasopharyngeal swab specimens and should not be used as a sole basis for treatment. Nasal washings and aspirates are unacceptable for Xpert Xpress SARS-CoV-2/FLU/RSV testing.  Fact Sheet for Patients: EntrepreneurPulse.com.au  Fact Sheet for Healthcare Providers: IncredibleEmployment.be  This test is not yet approved or  cleared by the [place] FDA and has been authorized for detection and/or diagnosis of SARS-CoV-2 by FDA under an Emergency Use Authorization (EUA). This EUA will remain in effect (meaning this test can be used) for the duration of the COVID-19 declaration under Section 564(b)(1) of the Act, 21 U.S.C. section 360bbb-3(b)(1), unless the authorization is terminated or revoked.  Performed at [facility], [address] [place], [place] [address]   Urine culture     Status: Abnormal   Collection Time: 04/03/20 11:59 PM   Specimen: Urine, Random  Result Value Ref Range Status   Specimen Description   Final    URINE, RANDOM Performed at [facility], [address] [place], [place] [address]    Special Requests   Final    NONE Performed at [facility], [address] [place], [place] [address]    Culture (A)  Final    <10,000 COLONIES/mL INSIGNIFICANT GROWTH Performed at [facility], [place] [address], [place], [place] [address]    Report Status 04/05/2020 FINAL  Final  SARS CORONAVIRUS 2 (TAT 6-24 HRS) Nasopharyngeal Nasopharyngeal Swab     Status: None   Collection Time: 04/09/20  2:33 PM   Specimen: Nasopharyngeal Swab  Result Value Ref Range Status   SARS Coronavirus 2 NEGATIVE NEGATIVE Final    Comment: (NOTE) SARS-CoV-2 target nucleic acids are NOT DETECTED.  The SARS-CoV-2 RNA is generally detectable in upper and lower respiratory  specimens during the acute phase of infection. Negative results do not preclude SARS-CoV-2 infection, do not rule out co-infections with other pathogens, and should not be used as the sole basis for treatment or other patient management decisions. Negative results must be combined with clinical observations, patient history, and epidemiological information. The expected result is Negative.  Fact Sheet for Patients: SugarRoll.be  Fact Sheet for Healthcare  Providers: https://www.woods-mathews.com/  This test is not yet approved or cleared by the [place] FDA and  has been authorized for detection and/or diagnosis of SARS-CoV-2 by FDA under an Emergency Use Authorization (EUA). This EUA will remain  in effect (meaning this test can be used) for the duration of the COVID-19 declaration under Se ction 564(b)(1) of the Act, 21 U.S.C. section 360bbb-3(b)(1), unless the authorization is terminated or revoked sooner.  Performed at [facility], [place] [address] [place], [place] [address]      Labs: BNP (last 3 results) No results for input(s): BNP in the last 8760 hours. Basic Metabolic Panel: Recent Labs  Lab 04/04/20 0621 04/05/20 0506 04/06/20 0419 04/07/20 0358 04/08/20 0347  NA 140 141 143 141 141  K 3.5 3.2* 3.0* 3.8 4.2  CL 108 107 107 107 109  CO2 21* 21* 22 21* 23  GLUCOSE 111* 108* 113* 128* 237*  BUN 15 12 24* 39* 36*  CREATININE 0.60 0.57 0.58 0.72 0.60  CALCIUM 8.6* 9.1 9.0 8.9 9.3  MG  --  1.8 1.9  --  2.0  PHOS  --  2.7 3.3  --  2.8   Liver Function Tests: Recent Labs  Lab 04/04/20 0107  AST 22  ALT 25  ALKPHOS 73  BILITOT 1.0  PROT 5.8*  ALBUMIN 3.0*   No results for input(s): LIPASE, AMYLASE in the last 168 hours. No results for input(s): AMMONIA in the last 168 hours. CBC: Recent Labs  Lab 04/03/20 0928 04/04/20 0107 04/04/20 0621 04/06/20 0419 04/07/20 0358 04/08/20 0347 04/09/20 0336 04/10/20 0419  WBC 11.7* 7.8   < > 9.1 9.9 13.0* 15.9* 13.1*  NEUTROABS 0.9* 0.8*  --   --   --   --   --   --   HGB 7.2* 5.5*   < > 7.8* 6.7* 10.9* 11.0* 10.5*  HCT 22.2* 17.3*   < > 23.3* 20.3* 31.7* 30.8* 30.4*  MCV 93.3 93.0   < > 89.3 90.6 87.8 86.0 89.4  PLT 8* 29*   < > 11* 21* 18* 17* 15*   < > = values in this interval not displayed.   Cardiac Enzymes: No results for input(s): CKTOTAL, CKMB, CKMBINDEX, TROPONINI in the last 168 hours. BNP: Invalid input(s):  POCBNP CBG: Recent Labs  Lab 04/09/20 1230 04/09/20 1610 04/09/20 2315 04/10/20 0754  GLUCAP 190* 192* 189* 118*   D-Dimer No results for input(s): DDIMER in the last 72 hours. Hgb A1c No results for input(s): HGBA1C in the last 72 hours. Lipid Profile No results for input(s): CHOL, HDL, LDLCALC, TRIG, CHOLHDL, LDLDIRECT in the last 72 hours. Thyroid function studies No results for input(s): TSH, T4TOTAL, T3FREE, THYROIDAB in the last 72 hours.  Invalid input(s): FREET3 Anemia work up No results for input(s): VITAMINB12, FOLATE, FERRITIN, TIBC, IRON, RETICCTPCT in the last 72 hours. Urinalysis    Component Value Date/Time   COLORURINE YELLOW (A) 04/03/2020 2359   APPEARANCEUR CLEAR (A) 04/03/2020 2359   APPEARANCEUR Cloudy (A) 02/05/2018 1116   LABSPEC 1.011 04/03/2020 2359   LABSPEC  1.010 10/21/2012 0106   PHURINE 6.0 04/03/2020 2359   GLUCOSEU NEGATIVE 04/03/2020 2359   GLUCOSEU Negative 10/21/2012 0106   HGBUR NEGATIVE 04/03/2020 2359   BILIRUBINUR NEGATIVE 04/03/2020 2359   BILIRUBINUR Negative 02/05/2018 1116   BILIRUBINUR Negative 10/21/2012 0106   KETONESUR NEGATIVE 04/03/2020 2359   PROTEINUR NEGATIVE 04/03/2020 2359   NITRITE NEGATIVE 04/03/2020 2359   LEUKOCYTESUR NEGATIVE 04/03/2020 2359   LEUKOCYTESUR 1+ 10/21/2012 0106   Sepsis Labs Invalid input(s): PROCALCITONIN,  WBC,  LACTICIDVEN Microbiology Recent Results (from the past 240 hour(s))  Urine Culture     Status: Abnormal   Collection Time: 04/03/20 10:28 AM   Specimen: Urine, Clean Catch  Result Value Ref Range Status   Specimen Description   Final    URINE, CLEAN CATCH Performed at [facility], [address], [place], [place] [address]    Special Requests   Final    NONE Performed at [facility], [address], [place], [place] [address]    Culture MULTIPLE SPECIES PRESENT, SUGGEST RECOLLECTION (A)  Final   Report Status 04/04/2020 FINAL  Final  Culture, blood  (routine x 2)     Status: None   Collection Time: 04/03/20 10:49 PM   Specimen: BLOOD  Result Value Ref Range Status   Specimen Description BLOOD LEFT ANTECUBITAL  Final   Special Requests   Final    BOTTLES DRAWN AEROBIC AND ANAEROBIC Blood Culture results may not be optimal due to an excessive volume of blood received in culture bottles   Culture   Final    NO GROWTH 5 DAYS Performed at [facility], [address] [place], [place] [address]    Report Status 04/08/2020 FINAL  Final  Culture, blood (routine x 2)     Status: None   Collection Time: 04/03/20 10:57 PM   Specimen: BLOOD  Result Value Ref Range Status   Specimen Description BLOOD BLOOD LEFT FOREARM  Final   Special Requests   Final    BOTTLES DRAWN AEROBIC AND ANAEROBIC Blood Culture results may not be optimal due to an excessive volume of blood received in culture bottles   Culture   Final    NO GROWTH 5 DAYS Performed at [facility], [address] [place], [place] [address]    Report Status 04/08/2020 FINAL  Final  Resp Panel by RT-PCR (Flu A&B, Covid) Nasopharyngeal Swab     Status: None   Collection Time: 04/03/20 11:15 PM   Specimen: Nasopharyngeal Swab; Nasopharyngeal(NP) swabs in vial transport medium  Result Value Ref Range Status   SARS Coronavirus 2 by RT PCR NEGATIVE NEGATIVE Final    Comment: (NOTE) SARS-CoV-2 target nucleic acids are NOT DETECTED.  The SARS-CoV-2 RNA is generally detectable in upper respiratory specimens during the acute phase of infection. The lowest concentration of SARS-CoV-2 viral copies this assay can detect is 138 copies/mL. A negative result does not preclude SARS-Cov-2 infection and should not be used as the sole basis for treatment or other patient management decisions. A negative result may occur with  improper specimen collection/handling, submission of specimen other than nasopharyngeal swab, presence of viral mutation(s) within the areas targeted  by this assay, and inadequate number of viral copies(<138 copies/mL). A negative result must be combined with clinical observations, patient history, and epidemiological information. The expected result is Negative.  Fact Sheet for Patients:  EntrepreneurPulse.com.au  Fact Sheet for Healthcare Providers:  IncredibleEmployment.be  This test is no t yet approved or  cleared by the [place] and  has been authorized for detection and/or diagnosis of SARS-CoV-2 by FDA under an Emergency Use Authorization (EUA). This EUA will remain  in effect (meaning this test can be used) for the duration of the COVID-19 declaration under Section 564(b)(1) of the Act, 21 U.S.C.section 360bbb-3(b)(1), unless the authorization is terminated  or revoked sooner.       Influenza A by PCR NEGATIVE NEGATIVE Final   Influenza B by PCR NEGATIVE NEGATIVE Final    Comment: (NOTE) The Xpert Xpress SARS-CoV-2/FLU/RSV plus assay is intended as an aid in the diagnosis of influenza from Nasopharyngeal swab specimens and should not be used as a sole basis for treatment. Nasal washings and aspirates are unacceptable for Xpert Xpress SARS-CoV-2/FLU/RSV testing.  Fact Sheet for Patients: EntrepreneurPulse.com.au  Fact Sheet for Healthcare Providers: IncredibleEmployment.be  This test is not yet approved or cleared by the [place] FDA and has been authorized for detection and/or diagnosis of SARS-CoV-2 by FDA under an Emergency Use Authorization (EUA). This EUA will remain in effect (meaning this test can be used) for the duration of the COVID-19 declaration under Section 564(b)(1) of the Act, 21 U.S.C. section 360bbb-3(b)(1), unless the authorization is terminated or revoked.  Performed at [facility], [address] [place], [place] [address]   Urine culture     Status: Abnormal   Collection Time: 04/03/20 11:59  PM   Specimen: Urine, Random  Result Value Ref Range Status   Specimen Description   Final    URINE, RANDOM Performed at [facility], [address] [place], [place] [address]    Special Requests   Final    NONE Performed at [facility], [address] [place], [place] [address]    Culture (A)  Final    <10,000 COLONIES/mL INSIGNIFICANT GROWTH Performed at [facility], [place] [address] [place], [place] [address]    Report Status 04/05/2020 FINAL  Final  SARS CORONAVIRUS 2 (TAT 6-24 HRS) Nasopharyngeal Nasopharyngeal Swab     Status: None   Collection Time: 04/09/20  2:33 PM   Specimen: Nasopharyngeal Swab  Result Value Ref Range Status   SARS Coronavirus 2 NEGATIVE NEGATIVE Final    Comment: (NOTE) SARS-CoV-2 target nucleic acids are NOT DETECTED.  The SARS-CoV-2 RNA is generally detectable in upper and lower respiratory specimens during the acute phase of infection. Negative results do not preclude SARS-CoV-2 infection, do not rule out co-infections with other pathogens, and should not be used as the sole basis for treatment or other patient management decisions. Negative results must be combined with clinical observations, patient history, and epidemiological information. The expected result is Negative.  Fact Sheet for Patients: SugarRoll.be  Fact Sheet for Healthcare Providers: https://www.woods-mathews.com/  This test is not yet approved or cleared by the [place] FDA and  has been authorized for detection and/or diagnosis of SARS-CoV-2 by FDA under an Emergency Use Authorization (EUA). This EUA will remain  in effect (meaning this test can be used) for the duration of the COVID-19 declaration under Se ction 564(b)(1) of the Act, 21 U.S.C. section 360bbb-3(b)(1), unless the authorization is terminated or revoked sooner.  Performed at [facility], [place] [address] [place],  [place] [address]      Time coordinating discharge: Over 30 minutes  SIGNED:   Nolberto Hanlon, MD  [facility] 04/10/2020, 9:08 AM Pager   If 7PM-7AM, please contact night-coverage [email or web address] Password [email or web address]

## 2020-04-09 NOTE — TOC Initial Note (Signed)
Transition of Care [facility]) - Initial/Assessment Note    Patient Details  Name: Jacqueline Russo MRN: [id number] Date of Birth: 04/22/1929  Transition of Care [facility]) CM/SW Contact:    Eileen Stanford, LCSW Phone Number: 04/09/2020, 2:18 PM  Clinical Narrative:   CSW spoke with admissions at [facility] and pt was at [facility] prior to admission. Pt cannot return to facility today due to time of the day. However, they will take pt back tomorrow as long as covid test is done and summary has to be sent to facility by 11 tomorrow. Facility also aware pt will be returning with hospice services. Facility states since they wouldn't be able to get their attending to order hospice until Monday therefore pt would be private pay through the weekend. CSW spoke with pt's sister and she is aware and agreeable. Pt will d/c tomorrow back to SNF with hospice. [facility] will provide hospice service at SNF--referral made to [facility].                Expected Discharge Plan: Skilled Nursing Facility Barriers to Discharge: Other (comment) (SNF can't take pt until tomorrow)   Patient Goals and CMS Choice     Choice offered to / list presented to : Sibling  Expected Discharge Plan and Services Expected Discharge Plan: [place] Acute Care Choice: Skilled Nursing Facility,Hospice Living arrangements for the past 2 months: [place] Expected Discharge Date: 04/09/20                                    Prior Living Arrangements/Services Living arrangements for the past 2 months: [place] Lives with:: Facility Resident Patient language and need for interpreter reviewed:: Yes Do you feel safe going back to the place where you live?: Yes      Need for Family Participation in Patient Care: Yes (Comment) Care giver support system in place?: Yes (comment)   Criminal Activity/Legal Involvement Pertinent to Current Situation/Hospitalization: No - Comment as  needed  Activities of Daily Living Home Assistive Devices/Equipment: Engineer, drilling (specify type) ADL Screening (condition at time of admission) Patient's cognitive ability adequate to safely complete daily activities?: Yes Is the patient deaf or have difficulty hearing?: No Does the patient have difficulty seeing, even when wearing glasses/contacts?: Yes Does the patient have difficulty concentrating, remembering, or making decisions?: No Patient able to express need for assistance with ADLs?: Yes Does the patient have difficulty dressing or bathing?: Yes Independently performs ADLs?: Yes (appropriate for developmental age) Does the patient have difficulty walking or climbing stairs?: Yes Weakness of Legs: Both Weakness of Arms/Hands: None  Permission Sought/Granted Permission sought to share information with : Family Supports    Share Information with NAME: Tammy  Permission granted to share info w AGENCY: [facility]  Permission granted to share info w Relationship: sister     Emotional Assessment Appearance:: Appears stated age     Orientation: : Oriented to Situation,Oriented to  Time,Oriented to Place,Oriented to Self Alcohol / Substance Use: Not Applicable Psych Involvement: No (comment)  Admission diagnosis:  Thrombocytopenia ([place]) [D69.6] Chronic anemia [D64.9] Blood transfusion reaction, initial encounter [T80.92XA] Mantle cell lymphoma, unspecified body region [facility]) [C83.10] Patient Active Problem List   Diagnosis Date Noted  . Palliative care encounter   . Thrombocytopenia ([place]) 04/03/2020  . Transfusion reaction 04/03/2020  . E coli bacteremia 03/26/2020  . Malnutrition of moderate degree  03/24/2020  . Arm swelling   . Sepsis due to Escherichia coli (E. coli) ([place]) 03/20/2020  . Cellulitis 03/20/2020  . Abnormal CT of brain 03/20/2020  . Mantle cell lymphoma ([place]) 03/05/2020  . Goals of care, counseling/discussion 03/05/2020  . Anxiety 05/17/2019  .  Hypokalemia due to inadequate potassium intake 05/17/2019  . Symptomatic anemia 05/17/2019  . Iron overload 05/17/2019  . MDS (myelodysplastic syndrome) with 5q deletion ([place]) 05/16/2019  . Port-A-Cath in place 02/18/2019  . Exertional dyspnea 10/04/2018  . Gouty arthropathy, chronic, without tophi 07/04/2018  . Right renal mass 02/28/2018  . DM type 2 with diabetic mixed hyperlipidemia ([place]) 08/25/2017  . Idiopathic peripheral neuropathy 02/22/2017  . CLL (chronic lymphocytic leukemia) ([place]) 11/16/2015  . Myelodysplastic syndrome ([place]) 11/16/2015  . Vitamin B12 deficient megaloblastic anemia 10/22/2015  . Carotid stenosis, bilateral 07/21/2015  . Diet-controlled type 2 diabetes mellitus ([place]) 04/22/2015  . CKD (chronic kidney disease) stage 3, GFR 30-59 ml/min ([facility]) 04/22/2015  . Benign essential hypertension 01/14/2014  . Hyperlipemia 01/14/2014  . Primary osteoarthritis of both hands 01/14/2014   PCP:  [name], MD Pharmacy:   [place], [place] [address] Phone: [phone] Fax: [phone]  [facility] [place], [place] [address] [place] [address] Phone: [phone] Fax: [phone]     Social Determinants of Health (SDOH) Interventions    Readmission Risk Interventions No flowsheet data found.

## 2020-04-09 NOTE — Progress Notes (Addendum)
[place] [facility] [place] [facility]) Hospital Liaison RN note:  Received request from Dr. Manuella Ghazi for hospice services after discharge at [facility]. Chart and patient information under review by [facility] Physician and hospice eligibility has been approved. Hospital care team is aware.  Spoke with patient via phone to initiate education related to hospice philosophy, services and to answer any questions. She verbalized understanding and had no questions. She is looking forward to returning to [place] tomorrow with Hospice services.  No DME needs. Patient will be returning to [place] tomorrow pending Covid test results.  Please send signed and completed DNR with patient. Please provide prescriptions at discharge as needed to ensure ongoing symptom management.  Thank you for the opportunity to participate in this patient's care.  Zandra Abts, RN [facility] Liaison [phone]

## 2020-04-09 NOTE — Progress Notes (Signed)
[place]  [phone] [phone]   Name: Brileigh Sevcik Date: 04/09/2020 MRN: [id number]  DOB: 16-Aug-1929  Patient Care Team: [name], MD as PCP - General (Internal Medicine) Lloyd Huger, MD as Consulting Physician (Hematology and Oncology)    REASON FOR CONSULTATION: Jacqueline Russo is a 87 y.o. 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   Patient was admitted to hospital 04/03/2020 with thrombocytopenia.  She is status post multiple transfusions with platelets and packed red blood cells.  She was referred to palliative care to help address goals.   CODE STATUS: DNR  PAST MEDICAL HISTORY: Past Medical History:  Diagnosis Date  . Anemia    Myelodysplastic syndrome  . Arthritis   . Hypertension   . Leukemia, chronic lymphoid (HCC)    CLL  . Ovarian cancer ([place]) 1963    PAST SURGICAL HISTORY:  Past Surgical History:  Procedure Laterality Date  . ABDOMINAL HYSTERECTOMY    . APPENDECTOMY    . BREAST SURGERY     cyst removed from right breast  . EYE SURGERY Bilateral    Cataract Extractions  . IR FLUORO GUIDE CV LINE RIGHT  06/30/2017  . REVERSE SHOULDER ARTHROPLASTY Right 10/28/2014   Procedure: REVERSE SHOULDER ARTHROPLASTY;  Surgeon: Corky Mull, MD;  Location: [facility] ORS;  Service: Orthopedics;  Laterality: Right;    HEMATOLOGY/ONCOLOGY HISTORY:  Oncology History Overview Note  History of MDS, 5q deletion.  Patient refused treatment with Revlimid.  Underwent a bone marrow biopsy on 06/30/2017 which showed hypercellular marrow 50% with dyspoiesis and increased blasts ~6% consistent with myelodysplastic syndrome.  Given history of MDS with isolated deletion (5q), presence of increased blasts likely indicative of progressive disease.  Now transfusion dependent.  She continued to refuse Revlimid.  Previously had discussed  possibility of progressing to AML but patient declined further interventions beyond periodic blood transfusions.   History of iron overload and she receives Desferal prior to transfusions.   Patient has history of CLL and received single agent Rituxan in February 2015.  Previous bone marrow biopsy did not mention any involvement of CLL.    Oncology History     CLL (chronic lymphocytic leukemia) ([place])  11/16/2015 Initial Diagnosis   CLL (chronic lymphocytic leukemia) ([place])   Mantle cell lymphoma ([place])  03/05/2020 Initial Diagnosis   Mantle cell lymphoma ([place])   03/05/2020 Cancer Staging   Staging form: Hodgkin and Non-Hodgkin Lymphoma, AJCC 8th Edition - Clinical stage from 03/05/2020: Stage IV (Mantle cell lymphoma) - Signed by Lloyd Huger, MD on 03/05/2020   03/16/2020 -  Chemotherapy   The patient had palonosetron (ALOXI) injection 0.25 mg, 0.25 mg, Intravenous,  Once, 1 of 6 cycles Administration: 0.25 mg (03/16/2020) bendamustine (BENDEKA) 100 mg in sodium chloride 0.9 % 50 mL (1.8519 mg/mL) chemo infusion, 70 mg/m2 = 100 mg (100 % of original dose 70 mg/m2), Intravenous,  Once, 1 of 6 cycles Dose modification: 70 mg/m2 (original dose 70 mg/m2, Cycle 1, Reason: Provider Judgment) Administration: 100 mg (03/16/2020), 100 mg (03/17/2020) riTUXimab-pvvr (RUXIENCE) 500 mg in sodium chloride 0.9 % 250 mL (1.6667 mg/mL) infusion, 375 mg/m2 = 500 mg, Intravenous,  Once, 1 of 6 cycles Administration: 500 mg (03/16/2020)  for chemotherapy treatment.      ALLERGIES:  is allergic to chlorhexidine and ciprofloxacin.  MEDICATIONS:  Current Facility-Administered Medications  Medication Dose Route Frequency Provider Last Rate  Last Admin  . 0.9 %  sodium chloride infusion (Manually program via Guardrails IV Fluids)   Intravenous Once Shawna Clamp, MD      . 0.9 %  sodium chloride infusion (Manually program via Guardrails IV Fluids)   Intravenous Once Shawna Clamp, MD      . 0.9  %  sodium chloride infusion  10 mL/hr Intravenous Once Shawna Clamp, MD      . acetaminophen (TYLENOL) tablet 650 mg  650 mg Oral Q6H PRN Cox, Amy N, DO   650 mg at 04/09/20 0326  . ALPRAZolam Duanne Moron) tablet 0.25 mg  0.25 mg Oral Daily PRN Shawna Clamp, MD   0.25 mg at 04/08/20 2044  . amLODipine (NORVASC) tablet 5 mg  5 mg Oral Daily Shawna Clamp, MD   5 mg at 04/09/20 0930  . aspirin EC tablet 81 mg  81 mg Oral Daily Cox, Amy N, DO   81 mg at 04/09/20 0930  . benzocaine (ORAJEL) 10 % mucosal gel   Mouth/Throat BID PRN Shawna Clamp, MD   Given at 04/09/20 0932  . dexamethasone (DECADRON) tablet 4 mg  4 mg Oral Daily Lloyd Huger, MD   4 mg at 04/09/20 0930  . diphenhydrAMINE (BENADRYL) injection 50 mg  50 mg Intravenous Q6H PRN Cox, Amy N, DO   50 mg at 04/04/20 1219  . feeding supplement (BOOST / RESOURCE BREEZE) liquid 1 Container  1 Container Oral TID BM Val Riles, MD   1 Container at 04/09/20 [phone]  . HYDROcodone-acetaminophen (NORCO) 7.5-325 MG per tablet 1 tablet  1 tablet Oral Q8H PRN Cox, Amy N, DO   1 tablet at 04/04/20 0101  . magic mouthwash w/lidocaine  5 mL Oral QID Nolberto Hanlon, MD   5 mL at 04/09/20 1127  . mirtazapine (REMERON) tablet 7.5 mg  7.5 mg Oral QHS Shawna Clamp, MD   7.5 mg at 04/08/20 2044  . multivitamin with minerals tablet 1 tablet  1 tablet Oral Daily Val Riles, MD   1 tablet at 04/09/20 0930  . ondansetron (ZOFRAN) tablet 4 mg  4 mg Oral Q6H PRN Cox, Amy N, DO       Or  . ondansetron (ZOFRAN) injection 4 mg  4 mg Intravenous Q6H PRN Cox, Amy N, DO       Facility-Administered Medications Ordered in Other Encounters  Medication Dose Route Frequency Provider Last Rate Last Admin  . heparin lock flush 100 unit/mL  500 Units Intravenous Once Lloyd Huger, MD      . sodium chloride flush (NS) 0.9 % injection 10 mL  10 mL Intravenous PRN Lloyd Huger, MD   10 mL at 09/05/18 0822    VITAL SIGNS: BP 133/76 (BP Location: Left Arm)    Pulse 83   Temp 97.7 F (36.5 C)   Resp 18   Ht 5' (1.524 m)   Wt 82 lb 12.8 oz (37.6 kg)   SpO2 98%   BMI 16.17 kg/m  Filed Weights   04/03/20 1503 04/08/20 0141 04/09/20 0206  Weight: 97 lb (44 kg) 88 lb 9.6 oz (40.2 kg) 82 lb 12.8 oz (37.6 kg)    Estimated body mass index is 16.17 kg/m as calculated from the following:   Height as of this encounter: 5' (1.524 m).   Weight as of this encounter: 82 lb 12.8 oz (37.6 kg).  LABS: CBC:    Component Value Date/Time   WBC 15.9 (H) 04/09/2020 0336   HGB  11.0 (L) 04/09/2020 0336   HGB 11.6 (L) 01/06/2014 0828   HCT 30.8 (L) 04/09/2020 0336   HCT 32.7 (L) 01/06/2014 0828   PLT 17 (LL) 04/09/2020 0336   PLT 176 01/06/2014 0828   MCV 86.0 04/09/2020 0336   MCV 103 (H) 01/06/2014 0828   NEUTROABS 0.8 (L) 04/04/2020 0107   NEUTROABS 1.6 01/06/2014 0828   LYMPHSABS 6.6 (H) 04/04/2020 0107   LYMPHSABS 1.4 01/06/2014 0828   MONOABS 0.3 04/04/2020 0107   MONOABS 0.3 01/06/2014 0828   EOSABS 0.0 04/04/2020 0107   EOSABS 0.1 01/06/2014 0828   BASOSABS 0.1 04/04/2020 0107   BASOSABS 0.1 01/06/2014 0828   Comprehensive Metabolic Panel:    Component Value Date/Time   NA 141 04/08/2020 0347   NA 140 06/26/2013 1044   K 4.2 04/08/2020 0347   K 3.7 06/26/2013 1044   CL 109 04/08/2020 0347   CL 102 06/26/2013 1044   CO2 23 04/08/2020 0347   CO2 32 06/26/2013 1044   BUN 36 (H) 04/08/2020 0347   BUN 16 06/26/2013 1044   CREATININE 0.60 04/08/2020 0347   CREATININE 1.01 01/06/2014 0828   GLUCOSE 237 (H) 04/08/2020 0347   GLUCOSE 121 (H) 06/26/2013 1044   CALCIUM 9.3 04/08/2020 0347   CALCIUM 9.0 06/26/2013 1044   AST 22 04/04/2020 0107   AST 12 (L) 03/27/2013 0955   ALT 25 04/04/2020 0107   ALT 20 03/27/2013 0955   ALKPHOS 73 04/04/2020 0107   ALKPHOS 87 03/27/2013 0955   BILITOT 1.0 04/04/2020 0107   BILITOT 0.5 03/27/2013 0955   PROT 5.8 (L) 04/04/2020 0107   PROT 7.3 03/27/2013 0955   ALBUMIN 3.0 (L) 04/04/2020 0107    ALBUMIN 4.1 03/27/2013 0955    RADIOGRAPHIC STUDIES: DG Chest 1 View  Result Date: 03/20/2020 CLINICAL DATA:  Right arm pain, swelling, and redness. Sepsis and fever. EXAM: CHEST  1 VIEW COMPARISON:  10/28/2016 FINDINGS: Power port type central venous catheter with tip over the cavoatrial junction region. No pneumothorax. Shallow inspiration. Cardiac enlargement. No vascular congestion or edema. Peribronchial thickening and streaky perihilar opacities similar to prior study, likely chronic bronchitic change. Mild atelectasis in the lung bases. No pleural effusions. No pneumothorax. Calcification of the aorta. Degenerative changes in the spine and left shoulder. Postoperative change in the right shoulder. IMPRESSION: Cardiac enlargement. No evidence of active pulmonary disease. Electronically Signed   By: Lucienne Capers M.D.   On: 03/20/2020 16:43   DG Wrist Complete Right  Result Date: 03/20/2020 CLINICAL DATA:  Acute right wrist pain and swelling. EXAM: RIGHT WRIST - COMPLETE 3+ VIEW COMPARISON:  None. FINDINGS: There is no evidence of fracture or dislocation. Severe narrowing of the first carpometacarpal joint is noted as well as mild narrowing of the radiocarpal joint. Calcification of the triangular fibrocartilage is noted. Soft tissues are unremarkable. IMPRESSION: Osteoarthritis is noted involving the first carpometacarpal joint and radiocarpal joint. No acute abnormality seen in the right wrist. Electronically Signed   By: Marijo Conception M.D.   On: 03/20/2020 10:15   CT HEAD WO CONTRAST  Result Date: 04/04/2020 CLINICAL DATA:  Headache.  History of lymphoma. EXAM: CT HEAD WITHOUT CONTRAST TECHNIQUE: Contiguous axial images were obtained from the base of the skull through the vertex without intravenous contrast. COMPARISON:  03/19/2020 FINDINGS: Brain: There is no mass, hemorrhage or extra-axial collection. There is generalized atrophy without lobar predilection. Hypodensity of the white  matter is most commonly associated with chronic microvascular disease.  Vascular: Atherosclerotic calcification of the vertebral and internal carotid arteries at the skull base. No abnormal hyperdensity of the major intracranial arteries or dural venous sinuses. Skull: The visualized skull base, calvarium and extracranial soft tissues are normal. Sinuses/Orbits: No fluid levels or advanced mucosal thickening of the visualized paranasal sinuses. No mastoid or middle ear effusion. The orbits are normal. IMPRESSION: 1. No acute intracranial abnormality. 2. Generalized atrophy and chronic microvascular ischemia. Electronically Signed   By: Ulyses Jarred M.D.   On: 04/04/2020 00:16   CT Head W Wo Contrast  Addendum Date: 03/19/2020   ADDENDUM REPORT: 03/19/2020 18:16 ADDENDUM: These results were called by telephone at the time of interpretation on 03/19/2020 at 6:14 pm to Beckey Rutter NP, who verbally acknowledged these results. Electronically Signed   By: Pedro Earls M.D.   On: 03/19/2020 18:16   Result Date: 03/19/2020 CLINICAL DATA:  Change in mental status. 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 Fall. EXAM: CT HEAD WITHOUT AND WITH CONTRAST TECHNIQUE: Contiguous axial images were obtained from the base of the skull through the vertex without and with intravenous contrast CONTRAST:  77mL OMNIPAQUE IOHEXOL 300 MG/ML  SOLN COMPARISON:  Head CT October 20, 2012. FINDINGS: Brain: A 9 mm hypodense focus in the anterior right thalamus, may represent ischemia, age indeterminate. No focus of abnormal contrast enhancement. No hemorrhage, hydrocephalus, mass effect or extra-axial collection. Vascular: No hyperdense vessel prominent calcified plaques in the bilateral vertebral arteries and carotid siphons. Skull: Normal. Negative for fracture or focal lesion. Sinuses/Orbits: No acute finding. IMPRESSION: 1. Hypodense focus in the anterior right thalamus, may represent ischemia, age indeterminate. MRI  could be used to further evaluate. 2. No focus of abnormal contrast enhancement. 3. Prominent calcified plaques in the bilateral vertebral arteries and carotid siphons. Electronically Signed: By: Pedro Earls M.D. On: 03/19/2020 17:30   CT ANGIO CHEST PE W OR WO CONTRAST  Result Date: 04/04/2020 CLINICAL DATA:  Concern for PE.  Shortness of breath. EXAM: CT ANGIOGRAPHY CHEST WITH CONTRAST TECHNIQUE: Multidetector CT imaging of the chest was performed using the standard protocol during bolus administration of intravenous contrast. Multiplanar CT image reconstructions and MIPs were obtained to evaluate the vascular anatomy. CONTRAST:  55mL OMNIPAQUE IOHEXOL 350 MG/ML SOLN COMPARISON:  CT dated June 09, 2014 FINDINGS: Cardiovascular: Contrast injection is sufficient to demonstrate satisfactory opacification of the pulmonary arteries to the segmental level. There is no pulmonary embolus or evidence of right heart strain. The size of the main pulmonary artery is normal. Cardiomegaly with coronary artery calcification. The course and caliber of the aorta are normal. There is mild atherosclerotic calcification. Opacification decreased due to pulmonary arterial phase contrast bolus timing. There is a right-sided central venous catheter with tip terminating near the cavoatrial junction. Mediastinum/Nodes: --mediastinal adenopathy is noted. For example there is a precarinal lymph node measuring approximately 1.7 cm. --hilar adenopathy is noted. -- No axillary lymphadenopathy. --there is left supraclavicular adenopathy. -- Normal thyroid gland where visualized. -  Unremarkable esophagus. Lungs/Pleura: There is a moderate to large left-sided pleural effusion with adjacent compressive atelectasis. There is no pneumothorax. The trachea is unremarkable. Upper Abdomen: Contrast bolus timing is not optimized for evaluation of the abdominal organs. There is significant splenomegaly of the partially visualized  spleen. There is a right hepatic lobe cyst measuring approximately 4.8 cm. There is partially visualized intrahepatic biliary ductal dilatation. There may be some adenopathy at the level of the porta hepatis. There is moderate narrowing at the  origin of the celiac axis. Musculoskeletal: There are old healed bilateral rib fractures. Review of the MIP images confirms the above findings. IMPRESSION: 1. There is no evidence for acute pulmonary embolus. 2. There is a moderate to large left-sided pleural effusion with adjacent compressive atelectasis. 3. There is mediastinal, hilar, and left supraclavicular adenopathy. There is significant splenomegaly of the partially visualized spleen. Findings are concerning for a lymphoproliferative disorder such as lymphoma. 4. There is partially visualized intrahepatic biliary ductal dilatation. Correlation with laboratory studies is recommended Aortic Atherosclerosis (ICD10-I70.0). Electronically Signed   By: Constance Holster M.D.   On: 04/04/2020 05:44   CT HUMERUS RIGHT W CONTRAST  Result Date: 03/26/2020 CLINICAL DATA:  Right arm cellulitis follow up. EXAM: CT OF THE UPPER RIGHT EXTREMITY WITH CONTRAST TECHNIQUE: Multidetector CT imaging of the upper right extremity was performed according to the standard protocol following intravenous contrast administration. CONTRAST:  70mL OMNIPAQUE IOHEXOL 300 MG/ML  SOLN COMPARISON:  CT right humerus and forearm dated March 21, 2020. FINDINGS: Bones/Joint/Cartilage No fracture or dislocation. No bony destruction or periosteal reaction. No joint effusion. Prior right reverse total shoulder arthroplasty. No evidence of hardware failure or loosening. Unchanged intra-articular bodies in the subscapularis recess. Ligaments Ligaments are suboptimally evaluated by CT. Muscles and Tendons Grossly intact. Soft tissue Soft tissue swelling and skin thickening of the upper arm, most prominent medially, has mildly worsened and now extends further  superiorly. Soft tissue swelling and skin thickening of the forearm has slightly improved. No fluid collection or subcutaneous emphysema. No soft tissue mass. Right chest wall port catheter. Small right pleural effusion. Hepatic cysts. IMPRESSION: 1. Worsening cellulitis of the upper arm. Mildly improved cellulitis of forearm. No abscess. 2. No acute osseous abnormality. 3. Small right pleural effusion. Electronically Signed   By: Titus Dubin M.D.   On: 03/26/2020 08:17   CT HUMERUS RIGHT W CONTRAST  Result Date: 03/21/2020 CLINICAL DATA:  Soft tissue swelling, elevated white count, infection suspected EXAM: CT OF THE RIGHT HUMERUS WITHOUT CONTRAST TECHNIQUE: Multidetector CT imaging was performed according to the standard protocol. Multiplanar CT image reconstructions were also generated. COMPARISON:  None. FINDINGS: Stranding noted within the subcutaneous soft tissues in the lower upper arm just above the elbow, the elbow and upper forearm. This likely reflects cellulitis or edema. No drainable focal fluid collection. Changes of right shoulder replacement. No hardware complicating feature. No acute bony abnormality. IMPRESSION: Stranding within the subcutaneous soft tissues about the elbow suggesting cellulitis or edema. No focal fluid collection. Electronically Signed   By: Rolm Baptise M.D.   On: 03/21/2020 10:58   CT FOREARM RIGHT W CONTRAST  Result Date: 03/26/2020 CLINICAL DATA:  Right arm cellulitis follow up. EXAM: CT OF THE UPPER RIGHT EXTREMITY WITH CONTRAST TECHNIQUE: Multidetector CT imaging of the upper right extremity was performed according to the standard protocol following intravenous contrast administration. CONTRAST:  54mL OMNIPAQUE IOHEXOL 300 MG/ML  SOLN COMPARISON:  CT right humerus and forearm dated March 21, 2020. FINDINGS: Bones/Joint/Cartilage No fracture or dislocation. No bony destruction or periosteal reaction. No joint effusion. Prior right reverse total shoulder  arthroplasty. No evidence of hardware failure or loosening. Unchanged intra-articular bodies in the subscapularis recess. Ligaments Ligaments are suboptimally evaluated by CT. Muscles and Tendons Grossly intact. Soft tissue Soft tissue swelling and skin thickening of the upper arm, most prominent medially, has mildly worsened and now extends further superiorly. Soft tissue swelling and skin thickening of the forearm has slightly improved. No fluid collection  or subcutaneous emphysema. No soft tissue mass. Right chest wall port catheter. Small right pleural effusion. Hepatic cysts. IMPRESSION: 1. Worsening cellulitis of the upper arm. Mildly improved cellulitis of forearm. No abscess. 2. No acute osseous abnormality. 3. Small right pleural effusion. Electronically Signed   By: Titus Dubin M.D.   On: 03/26/2020 08:17   CT FOREARM RIGHT W CONTRAST  Result Date: 03/21/2020 CLINICAL DATA:  Soft tissue infection suspected. Swelling, elevated white count EXAM: CT OF THE RIGHT FOREARM WITH CONTRAST TECHNIQUE: Multidetector CT imaging was performed according to the standard protocol. Multiplanar CT image reconstructions were also generated. COMPARISON:  None. FINDINGS: Stranding within the subcutaneous soft tissues noted in the distal upper arm, elbow region, and forearm compatible with cellulitis or edema. No drainable focal fluid collection. No acute bony abnormality. IMPRESSION: Stranding within the subcutaneous soft tissues throughout the forearm suggesting cellulitis or edema. No acute bony abnormality or focal fluid collection. Electronically Signed   By: Rolm Baptise M.D.   On: 03/21/2020 11:00   CT HAND RIGHT W CONTRAST  Result Date: 03/21/2020 CLINICAL DATA:  Soft tissue infection suspected. Swelling. Elevated white blood cell count. EXAM: CT OF THE RIGHT HAND WITHOUT CONTRAST TECHNIQUE: Multidetector CT imaging of the right hand was performed according to the standard protocol. Multiplanar CT image  reconstructions were also generated. COMPARISON:  None. FINDINGS: No focal fluid collection within the soft tissue to suggest abscess. Mild subcutaneous soft tissue stranding suggest cellulitis or edema. No acute bony abnormality. Degenerative changes in the wrist and IP joint. IMPRESSION: No acute bony abnormality.  No soft tissue drainable abscess. Electronically Signed   By: Rolm Baptise M.D.   On: 03/21/2020 10:56   US Venous Img Upper Uni Right(DVT)  Result Date: 03/20/2020 CLINICAL DATA:  Right arm swelling. Abrasion to the right wrist with possible infection yesterday. Worsening today. Patient is on chemotherapy for CLL. Pain and edema with color changes. Anticoagulation therapy. EXAM: Right UPPER EXTREMITY VENOUS DOPPLER ULTRASOUND TECHNIQUE: Gray-scale sonography with graded compression, as well as color Doppler and duplex ultrasound were performed to evaluate the upper extremity deep venous system from the level of the subclavian vein and including the jugular, axillary, basilic, radial, ulnar and upper cephalic vein. Spectral Doppler was utilized to evaluate flow at rest and with distal augmentation maneuvers. COMPARISON:  None. FINDINGS: Contralateral Subclavian Vein: Respiratory phasicity is normal and symmetric with the symptomatic side. No evidence of thrombus. Normal compressibility. Internal Jugular Vein: No evidence of thrombus. Normal compressibility, respiratory phasicity and response to augmentation. Subclavian Vein: No evidence of thrombus. Normal compressibility, respiratory phasicity and response to augmentation. Axillary Vein: No evidence of thrombus. Normal compressibility, respiratory phasicity and response to augmentation. Cephalic Vein: No evidence of thrombus. Normal compressibility, respiratory phasicity and response to augmentation. Basilic Vein: No evidence of thrombus. Normal compressibility, respiratory phasicity and response to augmentation. Brachial Veins: No evidence of  thrombus. Normal compressibility, respiratory phasicity and response to augmentation. Radial Veins: No evidence of thrombus. Normal compressibility, respiratory phasicity and response to augmentation. Ulnar Veins: No evidence of thrombus. Normal compressibility, respiratory phasicity and response to augmentation. Venous Reflux:  None visualized. Other Findings: Contralateral left subclavian vein is also evaluated and is patent. IMPRESSION: No evidence of DVT within the right upper extremity. Electronically Signed   By: Lucienne Capers M.D.   On: 03/20/2020 16:41   DG Chest Portable 1 View  Result Date: 04/03/2020 CLINICAL DATA:  Abnormal hemoglobin and platelet counts. EXAM: PORTABLE CHEST 1 VIEW  COMPARISON:  March 20, 2020 FINDINGS: There is stable right-sided venous Port-A-Cath positioning. Stable chronic appearing increased lung markings are seen. Mild to moderate severity atelectasis and/or infiltrate is noted within the retrocardiac region of the left lung base. There is no evidence of a pleural effusion or pneumothorax. The cardiac silhouette is borderline in size. There is mild calcification of the aortic arch. Chronic right-sided rib fractures are noted. An intact right shoulder replacement is seen. IMPRESSION: Mild to moderate severity left basilar atelectasis and/or infiltrate. Electronically Signed   By: Virgina Norfolk M.D.   On: 04/03/2020 22:43   DG Hand Complete Right  Result Date: 03/20/2020 CLINICAL DATA:  Acute right hand pain and swelling. EXAM: RIGHT HAND - COMPLETE 3+ VIEW COMPARISON:  None. FINDINGS: There is no evidence of fracture or dislocation. Severe degenerative changes are seen involving the first carpometacarpal joint as well as the proximal and distal interphalangeal joints consistent with osteoarthritis. Soft tissues are unremarkable. IMPRESSION: Severe osteoarthritis is noted. No acute abnormality seen in the right hand. Electronically Signed   By: Marijo Conception M.D.    On: 03/20/2020 10:13   ECHOCARDIOGRAM COMPLETE  Result Date: 03/24/2020    ECHOCARDIOGRAM REPORT   Patient Name:   Jacqueline Russo Date of Exam: 03/23/2020 Medical Rec #:  [id number]   Height:       60.0 in Accession #:    [id number]  Weight:       97.0 lb Date of Birth:  11-12-1929  BSA:          1.372 m Patient Age:    52 years    BP:           182/80 mmHg Patient Gender: F           HR:           110 bpm. Exam Location:  [facility] Procedure: 2D Echo, Cardiac Doppler and Color Doppler Indications:     Bacteremia R78.81  History:         Patient has no prior history of Echocardiogram examinations.                  Risk Factors:Hypertension.  Sonographer:     Alyse Low Roar Referring Phys:  [id number] Tsosie Billing Diagnosing Phys: Nelva Bush MD IMPRESSIONS  1. Left ventricular ejection fraction, by estimation, is 60 to 65%. The left ventricle has normal function. The left ventricle has no regional wall motion abnormalities. Left ventricular diastolic parameters are consistent with Grade I diastolic dysfunction (impaired relaxation). Elevated left atrial pressure.  2. Right ventricular systolic function is normal. The right ventricular size is normal. There is moderately elevated pulmonary artery systolic pressure.  3. Left atrial size was moderately dilated.  4. The pericardial effusion is posterior to the left ventricle.  5. The mitral valve is abnormal. Mild mitral valve regurgitation. Mild mitral stenosis. Moderate mitral annular calcification.  6. The aortic valve was not well visualized. There is mild calcification of the aortic valve. There is mild thickening of the aortic valve. Aortic valve regurgitation is not visualized. No aortic stenosis is present.  7. The inferior vena cava is dilated in size with <50% respiratory variability, suggesting right atrial pressure of 15 mmHg. FINDINGS  Left Ventricle: Left ventricular ejection fraction, by estimation, is 60 to 65%. The left ventricle has normal function.  The left ventricle has no regional wall motion abnormalities. The left ventricular internal cavity size was normal in size. There is  borderline left ventricular  hypertrophy. Left ventricular diastolic parameters are consistent with Grade I diastolic dysfunction (impaired relaxation). Elevated left atrial pressure. Right Ventricle: The right ventricular size is normal. No increase in right ventricular wall thickness. Right ventricular systolic function is normal. There is moderately elevated pulmonary artery systolic pressure. The tricuspid regurgitant velocity is 2.97 m/s, and with an assumed right atrial pressure of 15 mmHg, the estimated right ventricular systolic pressure is 70.2 mmHg. Left Atrium: Left atrial size was moderately dilated. Right Atrium: Right atrial size was normal in size. Pericardium: Trivial pericardial effusion is present. The pericardial effusion is posterior to the left ventricle. Mitral Valve: The mitral valve is abnormal. There is mild thickening of the mitral valve leaflet(s). There is decreased mobility of the posterior leaflet. Moderate mitral annular calcification. Mild mitral valve regurgitation. Mild mitral valve stenosis. Tricuspid Valve: The tricuspid valve is not well visualized. Tricuspid valve regurgitation is mild. Aortic Valve: The aortic valve was not well visualized. There is mild calcification of the aortic valve. There is mild thickening of the aortic valve. Aortic valve regurgitation is not visualized. No aortic stenosis is present. Aortic valve peak gradient  measures 12.5 mmHg. Pulmonic Valve: The pulmonic valve was not well visualized. Pulmonic valve regurgitation is not visualized. No evidence of pulmonic stenosis. Aorta: The aortic root and ascending aorta are structurally normal, with no evidence of dilitation. Pulmonary Artery: The pulmonary artery is of normal size. Venous: The inferior vena cava is dilated in size with less than 50% respiratory variability,  suggesting right atrial pressure of 15 mmHg. IAS/Shunts: No atrial level shunt detected by color flow Doppler.  LEFT VENTRICLE PLAX 2D LVIDd:         4.10 cm  Diastology LVIDs:         2.80 cm  LV e' medial:    4.03 cm/s LV PW:         0.90 cm  LV E/e' medial:  30.0 LV IVS:        0.99 cm  LV e' lateral:   3.48 cm/s LVOT diam:     1.70 cm  LV E/e' lateral: 34.8 LVOT Area:     2.27 cm  RIGHT VENTRICLE RV Mid diam:    3.40 cm RV S prime:     14.80 cm/s TAPSE (M-mode): 2.1 cm LEFT ATRIUM             Index       RIGHT ATRIUM           Index LA diam:        3.35 cm 2.44 cm/m  RA Area:     16.40 cm LA Vol (A2C):   74.3 ml 54.14 ml/m RA Volume:   41.70 ml  30.38 ml/m LA Vol (A4C):   66.6 ml 48.53 ml/m LA Biplane Vol: 70.7 ml 51.51 ml/m  AORTIC VALVE                PULMONIC VALVE AV Area (Vmax): 1.54 cm    PV Vmax:        0.90 m/s AV Vmax:        177.00 cm/s PV Peak grad:   3.2 mmHg AV Peak Grad:   12.5 mmHg   RVOT Peak grad: 1 mmHg LVOT Vmax:      120.00 cm/s  AORTA Ao Root diam: 2.35 cm Ao Asc diam:  2.50 cm MITRAL VALVE                TRICUSPID VALVE MV Area (  PHT): 6.83 cm     TR Peak grad:   35.3 mmHg MV Decel Time: 111 msec     TR Vmax:        297.00 cm/s MV E velocity: 121.00 cm/s MV A velocity: 173.00 cm/s  SHUNTS MV E/A ratio:  0.70         Systemic Diam: 1.70 cm MV A Prime:    16.2 cm/s Nelva Bush MD Electronically signed by Nelva Bush MD Signature Date/Time: 03/24/2020/7:09:57 AM    Final     PERFORMANCE STATUS (ECOG) : 3 - Symptomatic, >50% confined to bed  Review of Systems Unless otherwise noted, a complete review of systems is negative.  Physical Exam General: NAD Pulmonary: Unlabored Extremities: no edema, no joint deformities Skin: no rashes Neurological: Weakness but otherwise nonfocal  IMPRESSION: Platelets downtrending again today despite multiple transfusions.  No bleeding reported.  Oral intake is poor.  Performance status is limited.  Patient asked me about her  prognosis, which we discussed at length.  Previously, she has joked that she would live to be 89.  Today, she was more reserved and understanding that her long-term prognosis is likely limited even with treatment.  She would also like to speak with Dr. Grayland Ormond for his input.  We did discuss the future option of hospice care in the event that she were to decline or decide to forego treatment.  PLAN: -Continue current scope of treatment -Will follow   Case and plan discussed with Dr. Grayland Ormond  Time Total: 25 minutes  Visit consisted of counseling and education dealing with the complex and emotionally intense issues of symptom management and palliative care in the setting of serious and potentially life-threatening illness.Greater than 50%  of this time was spent counseling and coordinating care related to the above assessment and plan.  Signed by: Altha Harm, PhD, NP-C

## 2020-04-09 NOTE — Progress Notes (Signed)
[place]  [phone]) [phone] [phone]  ID: Jacqueline Russo OB: 24-Dec-1929  MR#: [id number]  [id number]  Patient Care Team: [name], MD as PCP - General (Internal Medicine) Lloyd Huger, MD as Consulting Physician (Hematology and Oncology)  CHIEF COMPLAINT: Stage IV mantle cell carcinoma, thrombocytopenia, anemia.  INTERVAL HISTORY: Patient continues to have significant weakness and fatigue.  She admits her appetite has improved.  She is requesting no further treatment and discharged home on hospice.  REVIEW OF SYSTEMS:   Review of Systems  Constitutional: Positive for malaise/fatigue. Negative for fever and weight loss.  Respiratory: Negative.  Negative for cough and shortness of breath.   Cardiovascular: Negative.  Negative for chest pain and leg swelling.  Gastrointestinal: Negative.  Negative for abdominal pain.  Genitourinary: Negative.  Negative for dysuria.  Musculoskeletal: Negative.  Negative for back pain.  Skin: Negative.  Negative for rash.  Neurological: Positive for weakness. Negative for dizziness, focal weakness and headaches.  Psychiatric/Behavioral: Negative.  The patient is not nervous/anxious.     As per HPI. Otherwise, a complete review of systems is negative.  PAST MEDICAL HISTORY: Past Medical History:  Diagnosis Date  . Anemia    Myelodysplastic syndrome  . Arthritis   . Hypertension   . Leukemia, chronic lymphoid (HCC)    CLL  . Ovarian cancer ([place]) 1963    PAST SURGICAL HISTORY: Past Surgical History:  Procedure Laterality Date  . ABDOMINAL HYSTERECTOMY    . APPENDECTOMY    . BREAST SURGERY     cyst removed from right breast  . EYE SURGERY Bilateral    Cataract Extractions  . IR FLUORO GUIDE CV LINE RIGHT  06/30/2017  . REVERSE SHOULDER ARTHROPLASTY Right 10/28/2014   Procedure: REVERSE SHOULDER ARTHROPLASTY;  Surgeon: Corky Mull, MD;  Location: [facility] ORS;  Service: Orthopedics;   Laterality: Right;    FAMILY HISTORY: History reviewed. No pertinent family history.  ADVANCED DIRECTIVES (Y/N):  @ADVDIR @  HEALTH MAINTENANCE: Social History   Tobacco Use  . Smoking status: Former Smoker    Packs/day: 2.00    Types: Cigarettes    Quit date: 1963    Years since quitting: 59.0  . Smokeless tobacco: Never Used  Vaping Use  . Vaping Use: Never used  Substance Use Topics  . Alcohol use: Not Currently    Alcohol/week: 1.0 standard drink    Types: 1 Glasses of wine per week    Comment: 1 glass of wine daily  . Drug use: No     Colonoscopy:  PAP:  Bone density:  Lipid panel:  Allergies  Allergen Reactions  . Chlorhexidine   . Ciprofloxacin Nausea Only    Current Facility-Administered Medications  Medication Dose Route Frequency Provider Last Rate Last Admin  . 0.9 %  sodium chloride infusion (Manually program via Guardrails IV Fluids)   Intravenous Once Shawna Clamp, MD      . 0.9 %  sodium chloride infusion (Manually program via Guardrails IV Fluids)   Intravenous Once Shawna Clamp, MD      . 0.9 %  sodium chloride infusion  10 mL/hr Intravenous Once Shawna Clamp, MD      . acetaminophen (TYLENOL) tablet 650 mg  650 mg Oral Q6H PRN Cox, Amy N, DO   650 mg at 04/09/20 0326  . ALPRAZolam Duanne Moron) tablet 0.25 mg  0.25 mg Oral Daily PRN Shawna Clamp, MD   0.25 mg at 04/08/20 2044  . amLODipine (NORVASC) tablet 5 mg  5 mg Oral Daily Shawna Clamp, MD   5 mg at 04/09/20 0930  . benzocaine (ORAJEL) 10 % mucosal gel   Mouth/Throat BID PRN Shawna Clamp, MD   Given at 04/09/20 0932  . dexamethasone (DECADRON) tablet 4 mg  4 mg Oral Daily Lloyd Huger, MD   4 mg at 04/09/20 0930  . diphenhydrAMINE (BENADRYL) injection 50 mg  50 mg Intravenous Q6H PRN Cox, Amy N, DO   50 mg at 04/04/20 1219  . feeding supplement (BOOST / RESOURCE BREEZE) liquid 1 Container  1 Container Oral TID BM Val Riles, MD   1 Container at 04/09/20 [phone]  .  HYDROcodone-acetaminophen (NORCO) 7.5-325 MG per tablet 1 tablet  1 tablet Oral Q8H PRN Cox, Amy N, DO   1 tablet at 04/04/20 0101  . insulin aspart (novoLOG) injection 0-9 Units  0-9 Units Subcutaneous TID WC Nolberto Hanlon, MD   2 Units at 04/09/20 1335  . magic mouthwash w/lidocaine  5 mL Oral QID Nolberto Hanlon, MD   5 mL at 04/09/20 1127  . mirtazapine (REMERON) tablet 7.5 mg  7.5 mg Oral QHS Shawna Clamp, MD   7.5 mg at 04/08/20 2044  . multivitamin with minerals tablet 1 tablet  1 tablet Oral Daily Val Riles, MD   1 tablet at 04/09/20 0930  . ondansetron (ZOFRAN) tablet 4 mg  4 mg Oral Q6H PRN Cox, Amy N, DO       Or  . ondansetron (ZOFRAN) injection 4 mg  4 mg Intravenous Q6H PRN Cox, Amy N, DO       Facility-Administered Medications Ordered in Other Encounters  Medication Dose Route Frequency Provider Last Rate Last Admin  . heparin lock flush 100 unit/mL  500 Units Intravenous Once Lloyd Huger, MD      . sodium chloride flush (NS) 0.9 % injection 10 mL  10 mL Intravenous PRN Lloyd Huger, MD   10 mL at 09/05/18 0822    OBJECTIVE: Vitals:   04/09/20 0730 04/09/20 1235  BP: 133/76 131/69  Pulse: 83 84  Resp: 18 19  Temp: 97.7 F (36.5 C) 98.3 F (36.8 C)  SpO2: 98% 98%     Body mass index is 16.17 kg/m.    ECOG FS:3 - Symptomatic, >50% confined to bed  General: thin, no acute distress. Eyes: Pink conjunctiva, anicteric sclera. HEENT: Normocephalic, moist mucous membranes. Lungs: No audible wheezing or coughing. Heart: Regular rate and rhythm. Abdomen: Soft, nontender, no obvious distention. Musculoskeletal: No edema, cyanosis, or clubbing. Neuro: Alert, answering all questions appropriately. Cranial nerves grossly intact. Skin: No rashes or petechiae noted. Psych: Normal affect.   LAB RESULTS:  Lab Results  Component Value Date   NA 141 04/08/2020   K 4.2 04/08/2020   CL 109 04/08/2020   CO2 23 04/08/2020   GLUCOSE 237 (H) 04/08/2020   BUN 36  (H) 04/08/2020   CREATININE 0.60 04/08/2020   CALCIUM 9.3 04/08/2020   PROT 5.8 (L) 04/04/2020   ALBUMIN 3.0 (L) 04/04/2020   AST 22 04/04/2020   ALT 25 04/04/2020   ALKPHOS 73 04/04/2020   BILITOT 1.0 04/04/2020   GFRNONAA >60 04/08/2020   GFRAA >60 08/22/2019    Lab Results  Component Value Date   WBC 15.9 (H) 04/09/2020   NEUTROABS 0.8 (L) 04/04/2020   HGB 11.0 (L) 04/09/2020   HCT 30.8 (L) 04/09/2020   MCV 86.0 04/09/2020   PLT 17 (LL) 04/09/2020     STUDIES: DG Chest  1 View  Result Date: 03/20/2020 CLINICAL DATA:  Right arm pain, swelling, and redness. Sepsis and fever. EXAM: CHEST  1 VIEW COMPARISON:  10/28/2016 FINDINGS: Power port type central venous catheter with tip over the cavoatrial junction region. No pneumothorax. Shallow inspiration. Cardiac enlargement. No vascular congestion or edema. Peribronchial thickening and streaky perihilar opacities similar to prior study, likely chronic bronchitic change. Mild atelectasis in the lung bases. No pleural effusions. No pneumothorax. Calcification of the aorta. Degenerative changes in the spine and left shoulder. Postoperative change in the right shoulder. IMPRESSION: Cardiac enlargement. No evidence of active pulmonary disease. Electronically Signed   By: Lucienne Capers M.D.   On: 03/20/2020 16:43   DG Wrist Complete Right  Result Date: 03/20/2020 CLINICAL DATA:  Acute right wrist pain and swelling. EXAM: RIGHT WRIST - COMPLETE 3+ VIEW COMPARISON:  None. FINDINGS: There is no evidence of fracture or dislocation. Severe narrowing of the first carpometacarpal joint is noted as well as mild narrowing of the radiocarpal joint. Calcification of the triangular fibrocartilage is noted. Soft tissues are unremarkable. IMPRESSION: Osteoarthritis is noted involving the first carpometacarpal joint and radiocarpal joint. No acute abnormality seen in the right wrist. Electronically Signed   By: Marijo Conception M.D.   On: 03/20/2020 10:15    CT HEAD WO CONTRAST  Result Date: 04/04/2020 CLINICAL DATA:  Headache.  History of lymphoma. EXAM: CT HEAD WITHOUT CONTRAST TECHNIQUE: Contiguous axial images were obtained from the base of the skull through the vertex without intravenous contrast. COMPARISON:  03/19/2020 FINDINGS: Brain: There is no mass, hemorrhage or extra-axial collection. There is generalized atrophy without lobar predilection. Hypodensity of the white matter is most commonly associated with chronic microvascular disease. Vascular: Atherosclerotic calcification of the vertebral and internal carotid arteries at the skull base. No abnormal hyperdensity of the major intracranial arteries or dural venous sinuses. Skull: The visualized skull base, calvarium and extracranial soft tissues are normal. Sinuses/Orbits: No fluid levels or advanced mucosal thickening of the visualized paranasal sinuses. No mastoid or middle ear effusion. The orbits are normal. IMPRESSION: 1. No acute intracranial abnormality. 2. Generalized atrophy and chronic microvascular ischemia. Electronically Signed   By: Ulyses Jarred M.D.   On: 04/04/2020 00:16   CT Head W Wo Contrast  Addendum Date: 03/19/2020   ADDENDUM REPORT: 03/19/2020 18:16 ADDENDUM: These results were called by telephone at the time of interpretation on 03/19/2020 at 6:14 pm to Beckey Rutter NP, who verbally acknowledged these results. Electronically Signed   By: Pedro Earls M.D.   On: 03/19/2020 18:16   Result Date: 03/19/2020 CLINICAL DATA:  Change in mental status. 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 Fall. EXAM: CT HEAD WITHOUT AND WITH CONTRAST TECHNIQUE: Contiguous axial images were obtained from the base of the skull through the vertex without and with intravenous contrast CONTRAST:  12mL OMNIPAQUE IOHEXOL 300 MG/ML  SOLN COMPARISON:  Head CT October 20, 2012. FINDINGS: Brain: A 9 mm hypodense focus in the anterior right thalamus, may represent ischemia, age  indeterminate. No focus of abnormal contrast enhancement. No hemorrhage, hydrocephalus, mass effect or extra-axial collection. Vascular: No hyperdense vessel prominent calcified plaques in the bilateral vertebral arteries and carotid siphons. Skull: Normal. Negative for fracture or focal lesion. Sinuses/Orbits: No acute finding. IMPRESSION: 1. Hypodense focus in the anterior right thalamus, may represent ischemia, age indeterminate. MRI could be used to further evaluate. 2. No focus of abnormal contrast enhancement. 3. Prominent calcified plaques in the bilateral vertebral arteries and carotid  siphons. Electronically Signed: By: Pedro Earls M.D. On: 03/19/2020 17:30   CT ANGIO CHEST PE W OR WO CONTRAST  Result Date: 04/04/2020 CLINICAL DATA:  Concern for PE.  Shortness of breath. EXAM: CT ANGIOGRAPHY CHEST WITH CONTRAST TECHNIQUE: Multidetector CT imaging of the chest was performed using the standard protocol during bolus administration of intravenous contrast. Multiplanar CT image reconstructions and MIPs were obtained to evaluate the vascular anatomy. CONTRAST:  23mL OMNIPAQUE IOHEXOL 350 MG/ML SOLN COMPARISON:  CT dated June 09, 2014 FINDINGS: Cardiovascular: Contrast injection is sufficient to demonstrate satisfactory opacification of the pulmonary arteries to the segmental level. There is no pulmonary embolus or evidence of right heart strain. The size of the main pulmonary artery is normal. Cardiomegaly with coronary artery calcification. The course and caliber of the aorta are normal. There is mild atherosclerotic calcification. Opacification decreased due to pulmonary arterial phase contrast bolus timing. There is a right-sided central venous catheter with tip terminating near the cavoatrial junction. Mediastinum/Nodes: --mediastinal adenopathy is noted. For example there is a precarinal lymph node measuring approximately 1.7 cm. --hilar adenopathy is noted. -- No axillary  lymphadenopathy. --there is left supraclavicular adenopathy. -- Normal thyroid gland where visualized. -  Unremarkable esophagus. Lungs/Pleura: There is a moderate to large left-sided pleural effusion with adjacent compressive atelectasis. There is no pneumothorax. The trachea is unremarkable. Upper Abdomen: Contrast bolus timing is not optimized for evaluation of the abdominal organs. There is significant splenomegaly of the partially visualized spleen. There is a right hepatic lobe cyst measuring approximately 4.8 cm. There is partially visualized intrahepatic biliary ductal dilatation. There may be some adenopathy at the level of the porta hepatis. There is moderate narrowing at the origin of the celiac axis. Musculoskeletal: There are old healed bilateral rib fractures. Review of the MIP images confirms the above findings. IMPRESSION: 1. There is no evidence for acute pulmonary embolus. 2. There is a moderate to large left-sided pleural effusion with adjacent compressive atelectasis. 3. There is mediastinal, hilar, and left supraclavicular adenopathy. There is significant splenomegaly of the partially visualized spleen. Findings are concerning for a lymphoproliferative disorder such as lymphoma. 4. There is partially visualized intrahepatic biliary ductal dilatation. Correlation with laboratory studies is recommended Aortic Atherosclerosis (ICD10-I70.0). Electronically Signed   By: Constance Holster M.D.   On: 04/04/2020 05:44   CT HUMERUS RIGHT W CONTRAST  Result Date: 03/26/2020 CLINICAL DATA:  Right arm cellulitis follow up. EXAM: CT OF THE UPPER RIGHT EXTREMITY WITH CONTRAST TECHNIQUE: Multidetector CT imaging of the upper right extremity was performed according to the standard protocol following intravenous contrast administration. CONTRAST:  94mL OMNIPAQUE IOHEXOL 300 MG/ML  SOLN COMPARISON:  CT right humerus and forearm dated March 21, 2020. FINDINGS: Bones/Joint/Cartilage No fracture or dislocation.  No bony destruction or periosteal reaction. No joint effusion. Prior right reverse total shoulder arthroplasty. No evidence of hardware failure or loosening. Unchanged intra-articular bodies in the subscapularis recess. Ligaments Ligaments are suboptimally evaluated by CT. Muscles and Tendons Grossly intact. Soft tissue Soft tissue swelling and skin thickening of the upper arm, most prominent medially, has mildly worsened and now extends further superiorly. Soft tissue swelling and skin thickening of the forearm has slightly improved. No fluid collection or subcutaneous emphysema. No soft tissue mass. Right chest wall port catheter. Small right pleural effusion. Hepatic cysts. IMPRESSION: 1. Worsening cellulitis of the upper arm. Mildly improved cellulitis of forearm. No abscess. 2. No acute osseous abnormality. 3. Small right pleural effusion. Electronically Signed  By: Titus Dubin M.D.   On: 03/26/2020 08:17   CT HUMERUS RIGHT W CONTRAST  Result Date: 03/21/2020 CLINICAL DATA:  Soft tissue swelling, elevated white count, infection suspected EXAM: CT OF THE RIGHT HUMERUS WITHOUT CONTRAST TECHNIQUE: Multidetector CT imaging was performed according to the standard protocol. Multiplanar CT image reconstructions were also generated. COMPARISON:  None. FINDINGS: Stranding noted within the subcutaneous soft tissues in the lower upper arm just above the elbow, the elbow and upper forearm. This likely reflects cellulitis or edema. No drainable focal fluid collection. Changes of right shoulder replacement. No hardware complicating feature. No acute bony abnormality. IMPRESSION: Stranding within the subcutaneous soft tissues about the elbow suggesting cellulitis or edema. No focal fluid collection. Electronically Signed   By: Rolm Baptise M.D.   On: 03/21/2020 10:58   CT FOREARM RIGHT W CONTRAST  Result Date: 03/26/2020 CLINICAL DATA:  Right arm cellulitis follow up. EXAM: CT OF THE UPPER RIGHT EXTREMITY WITH  CONTRAST TECHNIQUE: Multidetector CT imaging of the upper right extremity was performed according to the standard protocol following intravenous contrast administration. CONTRAST:  45mL OMNIPAQUE IOHEXOL 300 MG/ML  SOLN COMPARISON:  CT right humerus and forearm dated March 21, 2020. FINDINGS: Bones/Joint/Cartilage No fracture or dislocation. No bony destruction or periosteal reaction. No joint effusion. Prior right reverse total shoulder arthroplasty. No evidence of hardware failure or loosening. Unchanged intra-articular bodies in the subscapularis recess. Ligaments Ligaments are suboptimally evaluated by CT. Muscles and Tendons Grossly intact. Soft tissue Soft tissue swelling and skin thickening of the upper arm, most prominent medially, has mildly worsened and now extends further superiorly. Soft tissue swelling and skin thickening of the forearm has slightly improved. No fluid collection or subcutaneous emphysema. No soft tissue mass. Right chest wall port catheter. Small right pleural effusion. Hepatic cysts. IMPRESSION: 1. Worsening cellulitis of the upper arm. Mildly improved cellulitis of forearm. No abscess. 2. No acute osseous abnormality. 3. Small right pleural effusion. Electronically Signed   By: Titus Dubin M.D.   On: 03/26/2020 08:17   CT FOREARM RIGHT W CONTRAST  Result Date: 03/21/2020 CLINICAL DATA:  Soft tissue infection suspected. Swelling, elevated white count EXAM: CT OF THE RIGHT FOREARM WITH CONTRAST TECHNIQUE: Multidetector CT imaging was performed according to the standard protocol. Multiplanar CT image reconstructions were also generated. COMPARISON:  None. FINDINGS: Stranding within the subcutaneous soft tissues noted in the distal upper arm, elbow region, and forearm compatible with cellulitis or edema. No drainable focal fluid collection. No acute bony abnormality. IMPRESSION: Stranding within the subcutaneous soft tissues throughout the forearm suggesting cellulitis or edema.  No acute bony abnormality or focal fluid collection. Electronically Signed   By: Rolm Baptise M.D.   On: 03/21/2020 11:00   CT HAND RIGHT W CONTRAST  Result Date: 03/21/2020 CLINICAL DATA:  Soft tissue infection suspected. Swelling. Elevated white blood cell count. EXAM: CT OF THE RIGHT HAND WITHOUT CONTRAST TECHNIQUE: Multidetector CT imaging of the right hand was performed according to the standard protocol. Multiplanar CT image reconstructions were also generated. COMPARISON:  None. FINDINGS: No focal fluid collection within the soft tissue to suggest abscess. Mild subcutaneous soft tissue stranding suggest cellulitis or edema. No acute bony abnormality. Degenerative changes in the wrist and IP joint. IMPRESSION: No acute bony abnormality.  No soft tissue drainable abscess. Electronically Signed   By: Rolm Baptise M.D.   On: 03/21/2020 10:56   US Venous Img Upper Uni Right(DVT)  Result Date: 03/20/2020 CLINICAL DATA:  Right  arm swelling. Abrasion to the right wrist with possible infection yesterday. Worsening today. Patient is on chemotherapy for CLL. Pain and edema with color changes. Anticoagulation therapy. EXAM: Right UPPER EXTREMITY VENOUS DOPPLER ULTRASOUND TECHNIQUE: Gray-scale sonography with graded compression, as well as color Doppler and duplex ultrasound were performed to evaluate the upper extremity deep venous system from the level of the subclavian vein and including the jugular, axillary, basilic, radial, ulnar and upper cephalic vein. Spectral Doppler was utilized to evaluate flow at rest and with distal augmentation maneuvers. COMPARISON:  None. FINDINGS: Contralateral Subclavian Vein: Respiratory phasicity is normal and symmetric with the symptomatic side. No evidence of thrombus. Normal compressibility. Internal Jugular Vein: No evidence of thrombus. Normal compressibility, respiratory phasicity and response to augmentation. Subclavian Vein: No evidence of thrombus. Normal  compressibility, respiratory phasicity and response to augmentation. Axillary Vein: No evidence of thrombus. Normal compressibility, respiratory phasicity and response to augmentation. Cephalic Vein: No evidence of thrombus. Normal compressibility, respiratory phasicity and response to augmentation. Basilic Vein: No evidence of thrombus. Normal compressibility, respiratory phasicity and response to augmentation. Brachial Veins: No evidence of thrombus. Normal compressibility, respiratory phasicity and response to augmentation. Radial Veins: No evidence of thrombus. Normal compressibility, respiratory phasicity and response to augmentation. Ulnar Veins: No evidence of thrombus. Normal compressibility, respiratory phasicity and response to augmentation. Venous Reflux:  None visualized. Other Findings: Contralateral left subclavian vein is also evaluated and is patent. IMPRESSION: No evidence of DVT within the right upper extremity. Electronically Signed   By: Lucienne Capers M.D.   On: 03/20/2020 16:41   DG Chest Portable 1 View  Result Date: 04/03/2020 CLINICAL DATA:  Abnormal hemoglobin and platelet counts. EXAM: PORTABLE CHEST 1 VIEW COMPARISON:  March 20, 2020 FINDINGS: There is stable right-sided venous Port-A-Cath positioning. Stable chronic appearing increased lung markings are seen. Mild to moderate severity atelectasis and/or infiltrate is noted within the retrocardiac region of the left lung base. There is no evidence of a pleural effusion or pneumothorax. The cardiac silhouette is borderline in size. There is mild calcification of the aortic arch. Chronic right-sided rib fractures are noted. An intact right shoulder replacement is seen. IMPRESSION: Mild to moderate severity left basilar atelectasis and/or infiltrate. Electronically Signed   By: Virgina Norfolk M.D.   On: 04/03/2020 22:43   DG Hand Complete Right  Result Date: 03/20/2020 CLINICAL DATA:  Acute right hand pain and swelling.  EXAM: RIGHT HAND - COMPLETE 3+ VIEW COMPARISON:  None. FINDINGS: There is no evidence of fracture or dislocation. Severe degenerative changes are seen involving the first carpometacarpal joint as well as the proximal and distal interphalangeal joints consistent with osteoarthritis. Soft tissues are unremarkable. IMPRESSION: Severe osteoarthritis is noted. No acute abnormality seen in the right hand. Electronically Signed   By: Marijo Conception M.D.   On: 03/20/2020 10:13   ECHOCARDIOGRAM COMPLETE  Result Date: 03/24/2020    ECHOCARDIOGRAM REPORT   Patient Name:   JIALI MORNING Date of Exam: 03/23/2020 Medical Rec #:  [id number]   Height:       60.0 in Accession #:    [id number]  Weight:       97.0 lb Date of Birth:  09/22/1929  BSA:          1.372 m Patient Age:    65 years    BP:           182/80 mmHg Patient Gender: F           HR:  110 bpm. Exam Location:  [facility] Procedure: 2D Echo, Cardiac Doppler and Color Doppler Indications:     Bacteremia R78.81  History:         Patient has no prior history of Echocardiogram examinations.                  Risk Factors:Hypertension.  Sonographer:     Alyse Low Roar Referring Phys:  [id number] Tsosie Billing Diagnosing Phys: Nelva Bush MD IMPRESSIONS  1. Left ventricular ejection fraction, by estimation, is 60 to 65%. The left ventricle has normal function. The left ventricle has no regional wall motion abnormalities. Left ventricular diastolic parameters are consistent with Grade I diastolic dysfunction (impaired relaxation). Elevated left atrial pressure.  2. Right ventricular systolic function is normal. The right ventricular size is normal. There is moderately elevated pulmonary artery systolic pressure.  3. Left atrial size was moderately dilated.  4. The pericardial effusion is posterior to the left ventricle.  5. The mitral valve is abnormal. Mild mitral valve regurgitation. Mild mitral stenosis. Moderate mitral annular calcification.  6. The aortic valve  was not well visualized. There is mild calcification of the aortic valve. There is mild thickening of the aortic valve. Aortic valve regurgitation is not visualized. No aortic stenosis is present.  7. The inferior vena cava is dilated in size with <50% respiratory variability, suggesting right atrial pressure of 15 mmHg. FINDINGS  Left Ventricle: Left ventricular ejection fraction, by estimation, is 60 to 65%. The left ventricle has normal function. The left ventricle has no regional wall motion abnormalities. The left ventricular internal cavity size was normal in size. There is  borderline left ventricular hypertrophy. Left ventricular diastolic parameters are consistent with Grade I diastolic dysfunction (impaired relaxation). Elevated left atrial pressure. Right Ventricle: The right ventricular size is normal. No increase in right ventricular wall thickness. Right ventricular systolic function is normal. There is moderately elevated pulmonary artery systolic pressure. The tricuspid regurgitant velocity is 2.97 m/s, and with an assumed right atrial pressure of 15 mmHg, the estimated right ventricular systolic pressure is 83.4 mmHg. Left Atrium: Left atrial size was moderately dilated. Right Atrium: Right atrial size was normal in size. Pericardium: Trivial pericardial effusion is present. The pericardial effusion is posterior to the left ventricle. Mitral Valve: The mitral valve is abnormal. There is mild thickening of the mitral valve leaflet(s). There is decreased mobility of the posterior leaflet. Moderate mitral annular calcification. Mild mitral valve regurgitation. Mild mitral valve stenosis. Tricuspid Valve: The tricuspid valve is not well visualized. Tricuspid valve regurgitation is mild. Aortic Valve: The aortic valve was not well visualized. There is mild calcification of the aortic valve. There is mild thickening of the aortic valve. Aortic valve regurgitation is not visualized. No aortic stenosis is  present. Aortic valve peak gradient  measures 12.5 mmHg. Pulmonic Valve: The pulmonic valve was not well visualized. Pulmonic valve regurgitation is not visualized. No evidence of pulmonic stenosis. Aorta: The aortic root and ascending aorta are structurally normal, with no evidence of dilitation. Pulmonary Artery: The pulmonary artery is of normal size. Venous: The inferior vena cava is dilated in size with less than 50% respiratory variability, suggesting right atrial pressure of 15 mmHg. IAS/Shunts: No atrial level shunt detected by color flow Doppler.  LEFT VENTRICLE PLAX 2D LVIDd:         4.10 cm  Diastology LVIDs:         2.80 cm  LV e' medial:    4.03 cm/s LV PW:  0.90 cm  LV E/e' medial:  30.0 LV IVS:        0.99 cm  LV e' lateral:   3.48 cm/s LVOT diam:     1.70 cm  LV E/e' lateral: 34.8 LVOT Area:     2.27 cm  RIGHT VENTRICLE RV Mid diam:    3.40 cm RV S prime:     14.80 cm/s TAPSE (M-mode): 2.1 cm LEFT ATRIUM             Index       RIGHT ATRIUM           Index LA diam:        3.35 cm 2.44 cm/m  RA Area:     16.40 cm LA Vol (A2C):   74.3 ml 54.14 ml/m RA Volume:   41.70 ml  30.38 ml/m LA Vol (A4C):   66.6 ml 48.53 ml/m LA Biplane Vol: 70.7 ml 51.51 ml/m  AORTIC VALVE                PULMONIC VALVE AV Area (Vmax): 1.54 cm    PV Vmax:        0.90 m/s AV Vmax:        177.00 cm/s PV Peak grad:   3.2 mmHg AV Peak Grad:   12.5 mmHg   RVOT Peak grad: 1 mmHg LVOT Vmax:      120.00 cm/s  AORTA Ao Root diam: 2.35 cm Ao Asc diam:  2.50 cm MITRAL VALVE                TRICUSPID VALVE MV Area (PHT): 6.83 cm     TR Peak grad:   35.3 mmHg MV Decel Time: 111 msec     TR Vmax:        297.00 cm/s MV E velocity: 121.00 cm/s MV A velocity: 173.00 cm/s  SHUNTS MV E/A ratio:  0.70         Systemic Diam: 1.70 cm MV A Prime:    16.2 cm/s Nelva Bush MD Electronically signed by Nelva Bush MD Signature Date/Time: 03/24/2020/7:09:57 AM    Final     ASSESSMENT: Stage IV mantle cell carcinoma,  thrombocytopenia, anemia.  PLAN:    1. Stage IV mantle cell carcinoma: Patient last received treatment with Bendamustine and Rituxan on December 27 and 28, 2021. After lengthy discussion with the patient, she wishes to return home with hospice.  No further treatments are planned.  No further follow-up is necessary in the cancer center.  Appreciate palliative care input.   2. Thrombocytopenia: Multifactorial secondary to lymphoma and chemotherapy several weeks ago.    Chronic and unchanged with a platelet count of 17 today.  Patient has requested hospice, therefore no further laboratory work or transfusions are needed.  3. Anemia:  Improved to 11.0 with transfusions.  No further lab work needed. 4. Confusion: Resolved.CT head is negative. 6.  Weakness and fatigue/poor appetite: Discontinue dexamethasone.  Home with hospice as above.      Lloyd Huger, MD   04/09/2020 2:21 PM

## 2020-04-09 NOTE — Progress Notes (Signed)
Mobility Specialist - Progress Note   04/09/20 1435  Mobility  Activity Refused mobility  Mobility performed by Mobility specialist    Attempted session this AM, pt occupied w/ nursing. Re-attempted at this time, pt politely declined. Pt shares she will be dc to SNF w/ hospice tomorrow. States "I want to rest and reflect on my life".     Triston Skare Mobility Specialist  04/09/20, 2:40 PM

## 2020-04-09 NOTE — Progress Notes (Signed)
PROGRESS NOTE    Jacqueline Russo  [id number] DOB: 17-Aug-1929 DOA: 04/03/2020 PCP: [name], MD    Brief Narrative:  This 87 years old female with PMH significant for mantle cell lymphoma stage IV, history of MDS, CLL presents to the emergency department from oncology clinic for low platelet counts.  Patient reports she was called to have 1 unit of platelet transfusion in the clinic,  approximately 1 hour into the transfusion she has developed fever and sinus tachycardia.  She denies any chest pain, shortness of breath, vomiting, abdominal pain.  She is admitted for possible transfusion reaction.  Her hemoglobin was 5.5 and platelet was 11 on arrival to the ED.  Hematology/oncology was consulted.  Patient has received 3 units of PRBCs and 2 units of platelet transfusions so far.   OFF note: Hospitalization from 03/20/2020 to 03/26/2020 for sepsis secondary to E. coli bacteremia suspect secondary to right upper extremity cellulitis. she was treated with empiric antibiotics. CT of the right upper extremity did not show any evidence of abscess, bony involvement, or necrotizing fasciitis. Blood cultures was positive for E. coli. ID specialist and general surgeon were consulted to assist with management. ID recommended IV ertapenem 1 g daily for 8 days at discharge.  She completed antibiotics on 04/03/2020    12/19-Platelet 18 today, via chat, oncology would like to hold off giving plt and monitor 12/20- plt down to 17, no bleeds noted  Consultants:   oncology  Procedures:   Antimicrobials:       Subjective: Mouth sores are feeling better with benzocaine . Appetite poor. Trying to drink her boos. No bleeds noted .   Objective: Vitals:   04/09/20 0206 04/09/20 0326 04/09/20 0437 04/09/20 0730  BP:  (!) 146/73 (!) 144/77 133/76  Pulse:  77 80 83  Resp:  16 20 18   Temp:  97.6 F (36.4 C) (!) 97.5 F (36.4 C) 97.7 F (36.5 C)  TempSrc:  Oral Oral   SpO2:  100%  98%   Weight: 37.6 kg     Height:        Intake/Output Summary (Last 24 hours) at 04/09/2020 0808 Last data filed at 04/09/2020 0604 Gross per 24 hour  Intake 240 ml  Output 300 ml  Net -60 ml   Filed Weights   04/03/20 1503 04/08/20 0141 04/09/20 0206  Weight: 44 kg 40.2 kg 37.6 kg    Examination:   calm, comfortable cta no w/r/r Regular s1/s2 Soft benign +bs No edema Aaxox3, grossly intact    Data Reviewed: I have personally reviewed following labs and imaging studies  CBC: Recent Labs  Lab 04/03/20 0928 04/04/20 0107 04/04/20 0621 04/05/20 0506 04/06/20 0419 04/07/20 0358 04/08/20 0347 04/09/20 0336  WBC 11.7* 7.8   < > 9.5 9.1 9.9 13.0* 15.9*  NEUTROABS 0.9* 0.8*  --   --   --   --   --   --   HGB 7.2* 5.5*   < > 8.0* 7.8* 6.7* 10.9* 11.0*  HCT 22.2* 17.3*   < > 23.7* 23.3* 20.3* 31.7* 30.8*  MCV 93.3 93.0   < > 88.4 89.3 90.6 87.8 86.0  PLT 8* 29*   < > 18* 11* 21* 18* 17*   < > = values in this interval not displayed.   Basic Metabolic Panel: Recent Labs  Lab 04/04/20 0621 04/05/20 0506 04/06/20 0419 04/07/20 0358 04/08/20 0347  NA 140 141 143 141 141  K 3.5 3.2* 3.0* 3.8 4.2  CL 108 107 107 107 109  CO2 21* 21* 22 21* 23  GLUCOSE 111* 108* 113* 128* 237*  BUN 15 12 24* 39* 36*  CREATININE 0.60 0.57 0.58 0.72 0.60  CALCIUM 8.6* 9.1 9.0 8.9 9.3  MG  --  1.8 1.9  --  2.0  PHOS  --  2.7 3.3  --  2.8   GFR: Estimated Creatinine Clearance: 28.9 mL/min (by C-G formula based on SCr of 0.6 mg/dL). Liver Function Tests: Recent Labs  Lab 04/04/20 0107  AST 22  ALT 25  ALKPHOS 73  BILITOT 1.0  PROT 5.8*  ALBUMIN 3.0*   No results for input(s): LIPASE, AMYLASE in the last 168 hours. No results for input(s): AMMONIA in the last 168 hours. Coagulation Profile: No results for input(s): INR, PROTIME in the last 168 hours. Cardiac Enzymes: No results for input(s): CKTOTAL, CKMB, CKMBINDEX, TROPONINI in the last 168 hours. BNP (last 3 results) No  results for input(s): PROBNP in the last 8760 hours. HbA1C: No results for input(s): HGBA1C in the last 72 hours. CBG: No results for input(s): GLUCAP in the last 168 hours. Lipid Profile: No results for input(s): CHOL, HDL, LDLCALC, TRIG, CHOLHDL, LDLDIRECT in the last 72 hours. Thyroid Function Tests: No results for input(s): TSH, T4TOTAL, FREET4, T3FREE, THYROIDAB in the last 72 hours. Anemia Panel: No results for input(s): VITAMINB12, FOLATE, FERRITIN, TIBC, IRON, RETICCTPCT in the last 72 hours. Sepsis Labs: No results for input(s): PROCALCITON, LATICACIDVEN in the last 168 hours.  Recent Results (from the past 240 hour(s))  Urine Culture     Status: Abnormal   Collection Time: 04/03/20 10:28 AM   Specimen: Urine, Clean Catch  Result Value Ref Range Status   Specimen Description   Final    URINE, CLEAN CATCH Performed at [facility], [address], [place], [place] [address]    Special Requests   Final    NONE Performed at [facility], [address] [place], [place] [address]    Culture MULTIPLE SPECIES PRESENT, SUGGEST RECOLLECTION (A)  Final   Report Status 04/04/2020 FINAL  Final  Culture, blood (routine x 2)     Status: None   Collection Time: 04/03/20 10:49 PM   Specimen: BLOOD  Result Value Ref Range Status   Specimen Description BLOOD LEFT ANTECUBITAL  Final   Special Requests   Final    BOTTLES DRAWN AEROBIC AND ANAEROBIC Blood Culture results may not be optimal due to an excessive volume of blood received in culture bottles   Culture   Final    NO GROWTH 5 DAYS Performed at [facility], [address], [place], [place] [address]    Report Status 04/08/2020 FINAL  Final  Culture, blood (routine x 2)     Status: None   Collection Time: 04/03/20 10:57 PM   Specimen: BLOOD  Result Value Ref Range Status   Specimen Description BLOOD BLOOD LEFT FOREARM  Final   Special Requests   Final    BOTTLES DRAWN AEROBIC AND ANAEROBIC  Blood Culture results may not be optimal due to an excessive volume of blood received in culture bottles   Culture   Final    NO GROWTH 5 DAYS Performed at [facility], [address] [place], [place] [address]    Report Status 04/08/2020 FINAL  Final  Resp Panel by RT-PCR (Flu A&B, Covid) Nasopharyngeal Swab     Status: None   Collection Time: 04/03/20 11:15 PM  Specimen: Nasopharyngeal Swab; Nasopharyngeal(NP) swabs in vial transport medium  Result Value Ref Range Status   SARS Coronavirus 2 by RT PCR NEGATIVE NEGATIVE Final    Comment: (NOTE) SARS-CoV-2 target nucleic acids are NOT DETECTED.  The SARS-CoV-2 RNA is generally detectable in upper respiratory specimens during the acute phase of infection. The lowest concentration of SARS-CoV-2 viral copies this assay can detect is 138 copies/mL. A negative result does not preclude SARS-Cov-2 infection and should not be used as the sole basis for treatment or other patient management decisions. A negative result may occur with  improper specimen collection/handling, submission of specimen other than nasopharyngeal swab, presence of viral mutation(s) within the areas targeted by this assay, and inadequate number of viral copies(<138 copies/mL). A negative result must be combined with clinical observations, patient history, and epidemiological information. The expected result is Negative.  Fact Sheet for Patients:  EntrepreneurPulse.com.au  Fact Sheet for Healthcare Providers:  IncredibleEmployment.be  This test is no t yet approved or cleared by the [place] FDA and  has been authorized for detection and/or diagnosis of SARS-CoV-2 by FDA under an Emergency Use Authorization (EUA). This EUA will remain  in effect (meaning this test can be used) for the duration of the COVID-19 declaration under Section 564(b)(1) of the Act, 21 U.S.C.section 360bbb-3(b)(1), unless the authorization  is terminated  or revoked sooner.       Influenza A by PCR NEGATIVE NEGATIVE Final   Influenza B by PCR NEGATIVE NEGATIVE Final    Comment: (NOTE) The Xpert Xpress SARS-CoV-2/FLU/RSV plus assay is intended as an aid in the diagnosis of influenza from Nasopharyngeal swab specimens and should not be used as a sole basis for treatment. Nasal washings and aspirates are unacceptable for Xpert Xpress SARS-CoV-2/FLU/RSV testing.  Fact Sheet for Patients: EntrepreneurPulse.com.au  Fact Sheet for Healthcare Providers: IncredibleEmployment.be  This test is not yet approved or cleared by the [place] FDA and has been authorized for detection and/or diagnosis of SARS-CoV-2 by FDA under an Emergency Use Authorization (EUA). This EUA will remain in effect (meaning this test can be used) for the duration of the COVID-19 declaration under Section 564(b)(1) of the Act, 21 U.S.C. section 360bbb-3(b)(1), unless the authorization is terminated or revoked.  Performed at [facility], [address] [place], [place] [address]   Urine culture     Status: Abnormal   Collection Time: 04/03/20 11:59 PM   Specimen: Urine, Random  Result Value Ref Range Status   Specimen Description   Final    URINE, RANDOM Performed at [facility], [address] [place], [place] [address]    Special Requests   Final    NONE Performed at [facility], [address] [place], [place] [address]    Culture (A)  Final    <10,000 COLONIES/mL INSIGNIFICANT GROWTH Performed at [facility], [place] [address], [place], [place] [address]    Report Status 04/05/2020 FINAL  Final         Radiology Studies: No results found.      Scheduled Meds: . sodium chloride   Intravenous Once  . sodium chloride   Intravenous Once  . amLODipine  5 mg Oral Daily  . aspirin EC  81 mg Oral Daily  . dexamethasone  4 mg Oral Daily  . feeding  supplement  1 Container Oral TID BM  . magic mouthwash w/lidocaine  5 mL Oral QID  . mirtazapine  7.5 mg Oral QHS  .  multivitamin with minerals  1 tablet Oral Daily   Continuous Infusions: . sodium chloride      Assessment & Plan:   Principal Problem:   Thrombocytopenia ([place]) Active Problems:   Benign essential hypertension   Anxiety   E coli bacteremia   Transfusion reaction   Palliative care encounter   Fever and sinus tachycardia possible transfusion reaction: Patient has developed fever and sinus tachycardia while having platelet transfusion. Oncology Dr. Tasia Catchings recommends admission and to contact blood bank and send phlebotomist for transfusion reaction work-up Blood bank has been contacted by ED provider and will send a phlebotomist for transfusion reaction work-up Blood cultures: No growth so far, urine cultures no growth so far.  UA unremarkable. CTA chest negative for acute PE,  There is a moderate to large left-sided pleural effusion with adjacent compressive atelectasis. There is mediastinal, hilar, and left supraclavicular adenopathy. There is significant splenomegaly of the partially visualized spleen. Findings are concerning for a lymphoproliferative disorder, such as lymphoma. 1/20 resolved No infectious source found. cxr had showed.cxr atel/infiltrate. But covid test negative. Likely was atelectas. Remains afebrile Patient needs to be premedicated with steroids, Tylenol, Benadryl before transfusions    Thrombocytopenia- -Patient received partial unit of platelet in the clinic. 1/20-plt down to 17. No bleed. Will f/u with Dr. Gary Fleet recommendation   Normochromic normocytic anemia: This could be secondary to lymphoproliferative disorder. Patient came with a hemoglobin of 5.5, transfuse 2 PRBC. Status post 1 unit PRBC initially after she agreed Posttransfusion CBC hemoglobin improving, today 11.0 Status post 2 units PRBC Patient is premedication with  steroids, Tylenol, Benadryl  New headache - Resolved CT head negative for acute abnormality    Hx. Of  E. coli bacteremia-she was discharged home with IV ertapenem 1 g daily -Patient completed last dose on 04/03/2020   DVT prophylaxis: scd Code Status:dnr Family Communication: none at bedside  Status is: Inpatient  Is inpatient due to severity of illness requiring inpatient status  Dispo: The patient is from: Home              Anticipated d/c is to: Home              Anticipated d/c date is: 3 days              Patient currently is not medically stable to d/c.Still with low platelets, oncology needs to clear pt.            LOS: 5 days   Time spent: 35 minutes with more than 50% on [place], MD [facility] Pager [phone]  If 7PM-7AM, please contact night-coverage 04/09/2020, 8:08 AM

## 2020-04-10 LAB — CBC
HCT: 30.4 % — ABNORMAL LOW (ref 36.0–46.0)
Hemoglobin: 10.5 g/dL — ABNORMAL LOW (ref 12.0–15.0)
MCH: 30.9 pg (ref 26.0–34.0)
MCHC: 34.5 g/dL (ref 30.0–36.0)
MCV: 89.4 fL (ref 80.0–100.0)
Platelets: 15 K/uL — CL (ref 150–400)
RBC: 3.4 MIL/uL — ABNORMAL LOW (ref 3.87–5.11)
RDW: 15.5 % (ref 11.5–15.5)
WBC: 13.1 K/uL — ABNORMAL HIGH (ref 4.0–10.5)
nRBC: 0 % (ref 0.0–0.2)

## 2020-04-10 LAB — GLUCOSE, CAPILLARY: Glucose-Capillary: 118 mg/dL — ABNORMAL HIGH (ref 70–99)

## 2020-04-10 LAB — SARS CORONAVIRUS 2 (TAT 6-24 HRS): SARS Coronavirus 2: NEGATIVE

## 2020-04-10 MED ORDER — HEPARIN SOD (PORK) LOCK FLUSH 10 UNIT/ML IV SOLN
10.0000 [IU] | INTRAVENOUS | Status: AC
  Administered 2020-04-10: 10 [IU]
  Filled 2020-04-10: qty 1

## 2020-04-10 NOTE — Plan of Care (Signed)

## 2020-04-10 NOTE — Telephone Encounter (Signed)
Call returned.

## 2020-04-10 NOTE — TOC Transition Note (Addendum)
Transition of Care [facility]) - CM/SW Discharge Note   Patient Details  Name: Jacqueline Russo MRN: [id number] Date of Birth: 08-07-1929  [facility] CM/SW Contact:  Eileen Stanford, LCSW Phone Number: 04/10/2020, 9:12 AM   Clinical Narrative:   Clinical Social Worker facilitated patient discharge including contacting patient family and facility to confirm patient discharge plans.  Clinical information faxed to facility and family agreeable with plan.  CSW arranged ambulance transport via [facility] to [facility] (room 352) .  RN to call [phone] for report prior to discharge.  [facility] on a delay this morning, they will pick pt up at 1:00.    Final next level of care: Skilled Nursing Facility Barriers to Discharge: No Barriers Identified   Patient Goals and CMS Choice     Choice offered to / list presented to : Sibling  Discharge Placement              Patient [facility] bed [facility]: [facility] Patient to be transferred to facility by: [facility]   Patient and family notified of of transfer: 04/10/20  Discharge Plan and Services     Post Acute Care Choice: Skilled Nursing Facility,Hospice                               Social Determinants of Health (SDOH) Interventions     Readmission Risk Interventions No flowsheet data found.

## 2020-04-10 NOTE — Care Management Important Message (Signed)
Important Message  Patient Details  Name: Jacqueline Russo MRN: [id number] Date of Birth: 28-Jun-1929   Medicare Important Message Given:  Yes  Reviewed verbally with patient via room phone due to isolation status.  Copy of Medicare IM already placed in mail to patient's attention.    Dannette Barbara 04/10/2020, 9:41 AM

## 2020-04-10 NOTE — TOC Progression Note (Signed)
Transition of Care [facility]) - Progression Note    Patient Details  Name: Jacqueline Russo MRN: [id number] Date of Birth: 14-Sep-1929  [facility] CM/SW Contact  Eileen Stanford, LCSW Phone Number: 04/10/2020, 8:06 AM  Clinical Narrative:   Pt dc to [facility] today--awaiting dc summary.    Expected Discharge Plan: Skilled Nursing Facility Barriers to Discharge: Other (comment) (SNF can't take pt until tomorrow)  Expected Discharge Plan and Services Expected Discharge Plan: [place] Acute Care Choice: Skilled Nursing Facility,Hospice Living arrangements for the past 2 months: [place] Expected Discharge Date: 04/09/20                                     Social Determinants of Health (SDOH) Interventions    Readmission Risk Interventions No flowsheet data found.

## 2020-06-02 ENCOUNTER — Encounter (INDEPENDENT_AMBULATORY_CARE_PROVIDER_SITE_OTHER): Payer: Medicare Other

## 2020-06-02 ENCOUNTER — Ambulatory Visit (INDEPENDENT_AMBULATORY_CARE_PROVIDER_SITE_OTHER): Payer: Medicare Other | Admitting: Vascular Surgery

## 2020-10-19 DEATH — deceased

## 2022-01-17 ENCOUNTER — Encounter (INDEPENDENT_AMBULATORY_CARE_PROVIDER_SITE_OTHER): Payer: Self-pay

## 2022-02-05 IMAGING — CR DG HAND COMPLETE 3+V*R*
1 series · 3 of 3 positions shown · non-contrast
Comparison: None.

CLINICAL DATA: Acute right hand pain and swelling.

EXAM:
RIGHT HAND - COMPLETE 3+ VIEW

[Series 1: dg hand complete right · 0.14mm/px · 3 of 3 slices shown]
[im 1/3]
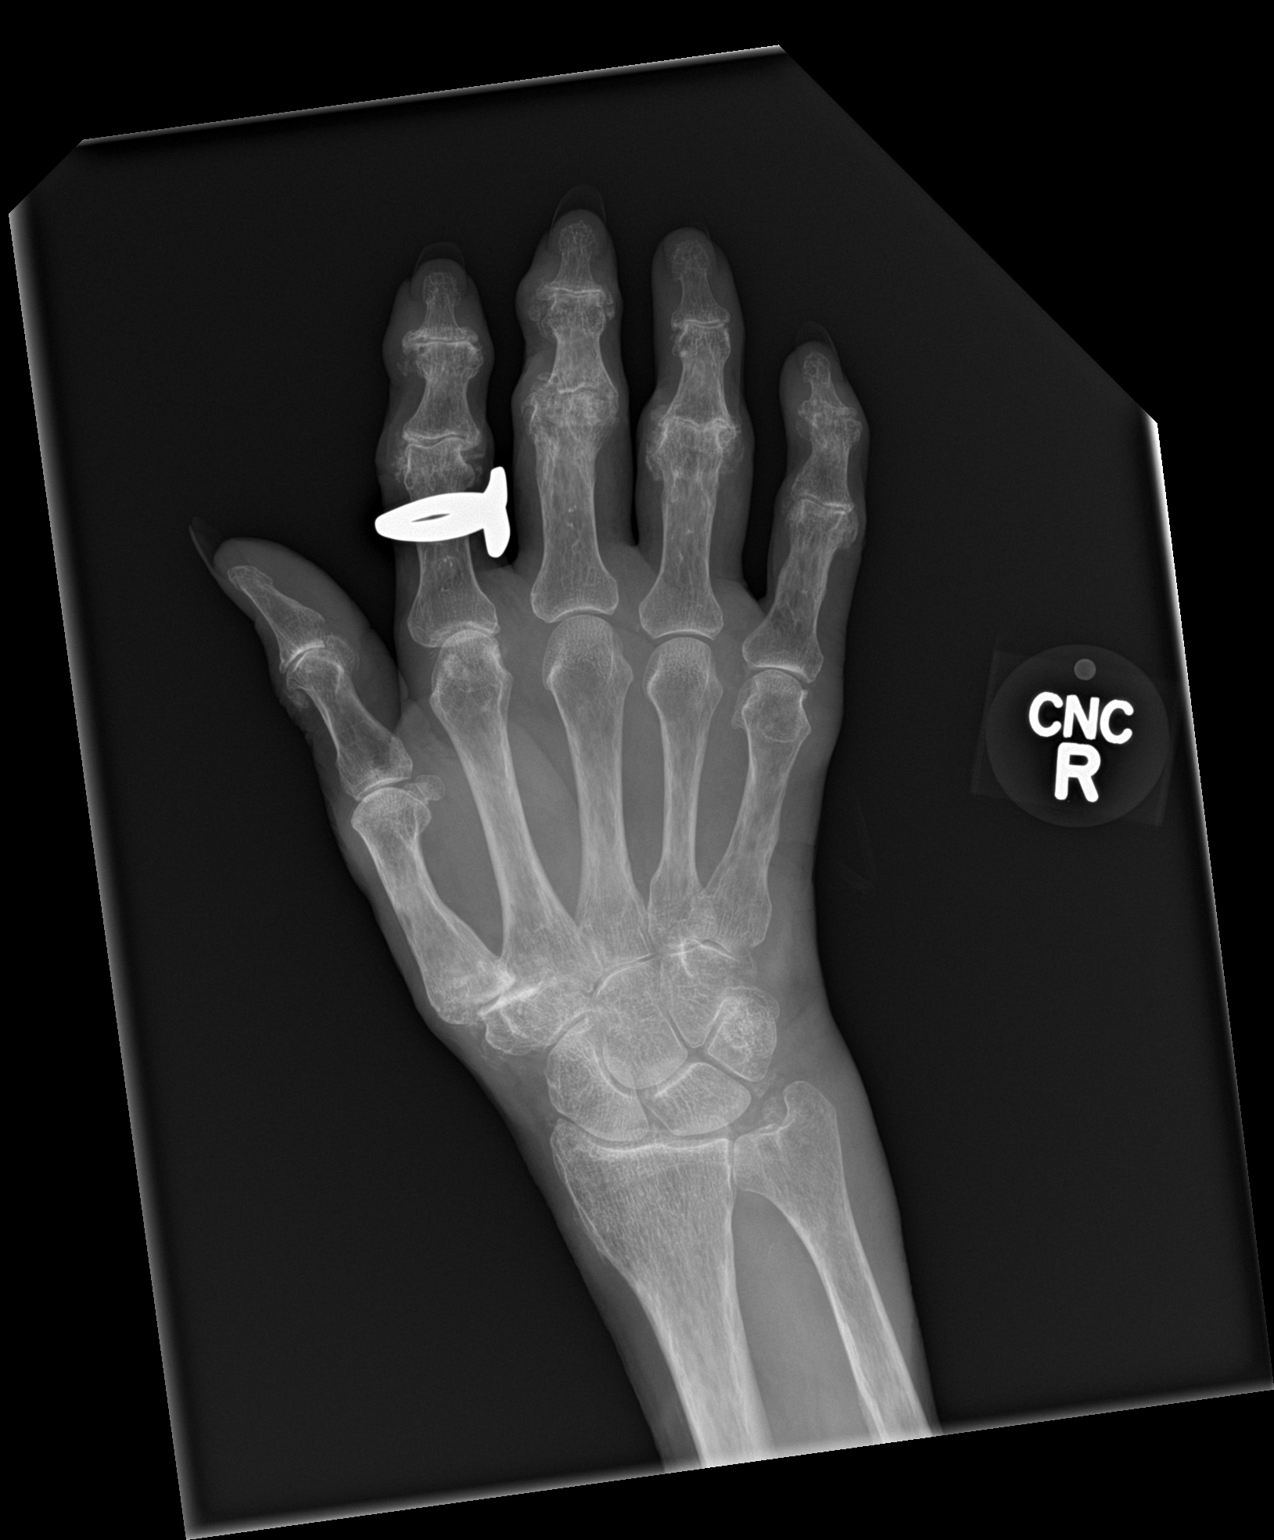
[im 2/3]
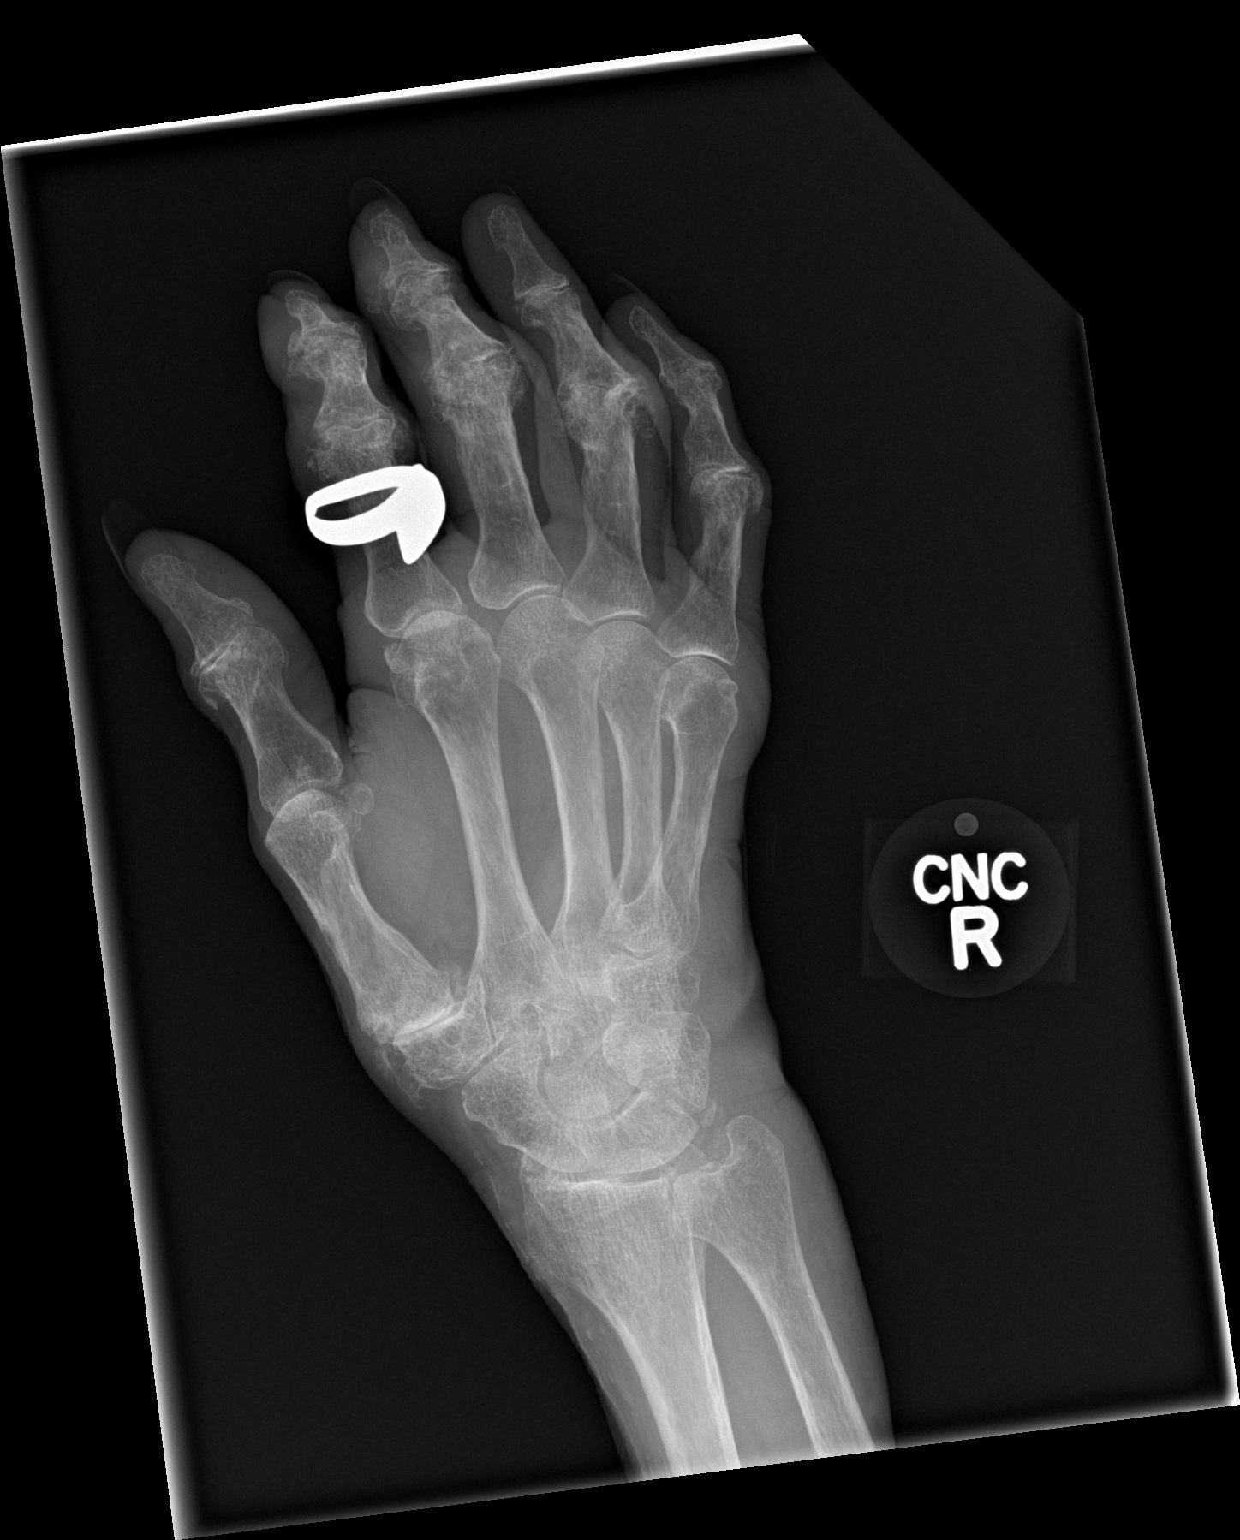
[im 3/3]
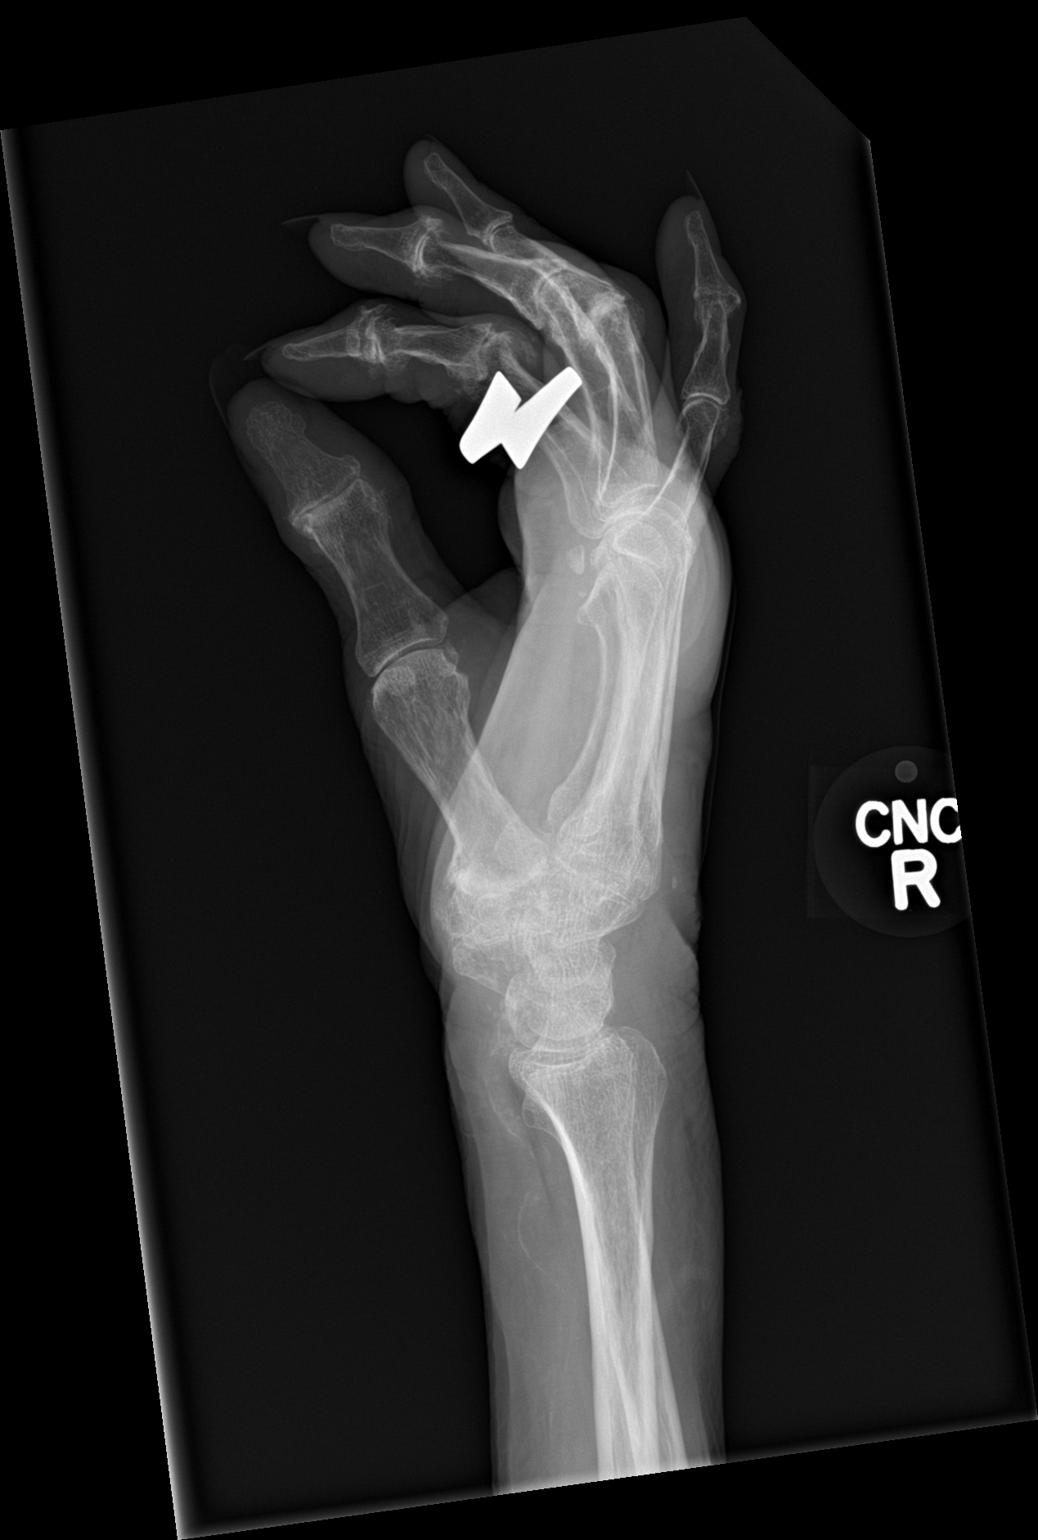

[3 of 3 positions shown; findings below may reference images not displayed]

FINDINGS: There is no evidence of fracture or dislocation. Severe degenerative
changes are seen involving the first carpometacarpal joint as well
as the proximal and distal interphalangeal joints consistent with
osteoarthritis. Soft tissues are unremarkable.
IMPRESSION: Severe osteoarthritis is noted. No acute abnormality seen in the
right hand.
